# Patient Record
Sex: Female | Born: 1945
Health system: Southern US, Community
[De-identification: ages and names within clinical notes are randomized; demographics above are authoritative.]

## PROBLEM LIST (undated history)

## (undated) DIAGNOSIS — I10 Essential (primary) hypertension: Secondary | ICD-10-CM

## (undated) DIAGNOSIS — Z800 Family history of malignant neoplasm of digestive organs: Secondary | ICD-10-CM

## (undated) DIAGNOSIS — Z5189 Encounter for other specified aftercare: Secondary | ICD-10-CM

## (undated) DIAGNOSIS — Z8042 Family history of malignant neoplasm of prostate: Secondary | ICD-10-CM

## (undated) DIAGNOSIS — IMO0001 Reserved for inherently not codable concepts without codable children: Secondary | ICD-10-CM

## (undated) DIAGNOSIS — Z803 Family history of malignant neoplasm of breast: Secondary | ICD-10-CM

## (undated) DIAGNOSIS — R011 Cardiac murmur, unspecified: Secondary | ICD-10-CM

## (undated) HISTORY — DX: Family history of malignant neoplasm of breast: Z80.3

## (undated) HISTORY — DX: Family history of malignant neoplasm of prostate: Z80.42

## (undated) HISTORY — PX: NO PAST SURGERIES: SHX2092

## (undated) HISTORY — DX: Family history of malignant neoplasm of digestive organs: Z80.0

## (undated) HISTORY — PX: DILATION AND CURETTAGE OF UTERUS: SHX78

---

## 2011-09-26 ENCOUNTER — Other Ambulatory Visit (HOSPITAL_COMMUNITY)
Admission: RE | Admit: 2011-09-26 | Discharge: 2011-09-26 | Disposition: A | Discharge status: Home / Self Care | Payer: 59 | Source: Ambulatory Visit | Attending: Obstetrics and Gynecology | Admitting: Obstetrics and Gynecology

## 2011-09-26 DIAGNOSIS — N950 Postmenopausal bleeding: Secondary | ICD-10-CM | POA: Insufficient documentation

## 2011-10-16 ENCOUNTER — Encounter (HOSPITAL_COMMUNITY): Payer: Self-pay | Admitting: Pharmacist

## 2011-10-17 ENCOUNTER — Encounter (HOSPITAL_COMMUNITY)
Admission: RE | Admit: 2011-10-17 | Discharge: 2011-10-17 | Disposition: A | Discharge status: Home / Self Care | Payer: 59 | Source: Ambulatory Visit | Attending: Obstetrics and Gynecology | Admitting: Obstetrics and Gynecology

## 2011-10-17 ENCOUNTER — Encounter (HOSPITAL_COMMUNITY): Payer: Self-pay

## 2011-10-17 HISTORY — DX: Cardiac murmur, unspecified: R01.1

## 2011-10-17 HISTORY — DX: Essential (primary) hypertension: I10

## 2011-10-17 LAB — BASIC METABOLIC PANEL
BUN: 15 mg/dL (ref 6–23)
CO2: 27 mEq/L (ref 19–32)
Calcium: 9.9 mg/dL (ref 8.4–10.5)
Creatinine, Ser: 1.03 mg/dL (ref 0.50–1.10)
GFR calc non Af Amer: 56 mL/min — ABNORMAL LOW (ref >90)
Glucose, Bld: 103 mg/dL — ABNORMAL HIGH (ref 70–99)

## 2011-10-17 LAB — CBC
MCH: 27.9 pg (ref 26.0–34.0)
MCHC: 32.7 g/dL (ref 30.0–36.0)
MCV: 85.4 fL (ref 78.0–100.0)
Platelets: 198 10*3/uL (ref 150–400)

## 2011-10-17 NOTE — Pre-Procedure Instructions (Signed)
Pt K of 3.1 reported to Dr Nancee Liter on hyzaar. Instructed to notify Dr Paul Half of need to add potassium supplement, Dr Arby Barrette recommends K-dur 40 meq twice daily until day of surgery. Spoke with Dr Greene-recommendations given. Will do potassium level dos stat

## 2011-10-17 NOTE — Patient Instructions (Addendum)
20 Sascha Mcray  10/17/2011   Your procedure is scheduled on:  Dec. 10, 3:45  Enter through the Main Entrance of [facility] at 2:15 AM.  Pick up the phone at the desk and dial [phone].   Call this number if you have problems the morning of surgery: [phone]   Remember:   Do not eat food:after midnight the evening prior to surgery.  Do not drink clear liquids: 4 Hours before arrival.  Take these medicines the morning of surgery with A SIP OF WATER: Take all Blood Pressure Medications day of surgery. Hold Metformin for 24hrs. Take Nexium as directed. Take Potassium AM of surgery.   Do not wear jewelry, make-up or nail polish.  Do not wear lotions, powders, or perfumes. You may wear deodorant.  Do not shave 48 hours prior to surgery.  Do not bring valuables to the hospital.  Contacts, dentures or bridgework may not be worn into surgery.  Leave suitcase in the car. After surgery it may be brought to your room.  For patients admitted to the hospital, checkout time is 11:00 AM the day of discharge.   Patients discharged the day of surgery will not be allowed to drive home.  Name and phone number of your driver: Lilyona Richner  Special Instructions: CHG Shower Use Special Wash: 1/2 bottle night before surgery and 1/2 bottle morning of surgery.   Please read over the following fact sheets that you were given: Antibacterial wash.

## 2011-10-22 ENCOUNTER — Ambulatory Visit (HOSPITAL_COMMUNITY)
Admission: RE | Admit: 2011-10-22 | Discharge: 2011-10-22 | Disposition: A | Discharge status: Home / Self Care | Payer: 59 | Source: Ambulatory Visit | Attending: Obstetrics and Gynecology | Admitting: Obstetrics and Gynecology

## 2011-10-22 ENCOUNTER — Ambulatory Visit (HOSPITAL_COMMUNITY): Payer: 59 | Admitting: Anesthesiology

## 2011-10-22 ENCOUNTER — Encounter (HOSPITAL_COMMUNITY): Payer: Self-pay | Admitting: Anesthesiology

## 2011-10-22 ENCOUNTER — Encounter (HOSPITAL_COMMUNITY): Payer: Self-pay | Admitting: *Deleted

## 2011-10-22 ENCOUNTER — Other Ambulatory Visit: Payer: Self-pay | Admitting: Obstetrics and Gynecology

## 2011-10-22 ENCOUNTER — Encounter (HOSPITAL_COMMUNITY)
Admission: RE | Disposition: A | Discharge status: Home / Self Care | Payer: Self-pay | Source: Ambulatory Visit | Attending: Obstetrics and Gynecology

## 2011-10-22 DIAGNOSIS — E119 Type 2 diabetes mellitus without complications: Secondary | ICD-10-CM | POA: Insufficient documentation

## 2011-10-22 DIAGNOSIS — Z01812 Encounter for preprocedural laboratory examination: Secondary | ICD-10-CM | POA: Insufficient documentation

## 2011-10-22 DIAGNOSIS — Z01818 Encounter for other preprocedural examination: Secondary | ICD-10-CM | POA: Insufficient documentation

## 2011-10-22 DIAGNOSIS — N950 Postmenopausal bleeding: Secondary | ICD-10-CM

## 2011-10-22 DIAGNOSIS — I10 Essential (primary) hypertension: Secondary | ICD-10-CM | POA: Insufficient documentation

## 2011-10-22 DIAGNOSIS — N840 Polyp of corpus uteri: Secondary | ICD-10-CM | POA: Insufficient documentation

## 2011-10-22 LAB — POTASSIUM: Potassium: 3.4 mEq/L — ABNORMAL LOW (ref 3.5–5.1)

## 2011-10-22 SURGERY — DILATATION & CURETTAGE/HYSTEROSCOPY WITH RESECTOCOPE
Anesthesia: Monitor Anesthesia Care

## 2011-10-22 MED ORDER — KETOROLAC TROMETHAMINE 30 MG/ML IJ SOLN
INTRAMUSCULAR | Status: DC | PRN
  Administered 2011-10-22: 30 mg via INTRAVENOUS

## 2011-10-22 MED ORDER — MEPERIDINE HCL 25 MG/ML IJ SOLN
6.2500 mg | INTRAMUSCULAR | Status: DC | PRN
  Administered 2011-10-22: 6.25 mg via INTRAVENOUS

## 2011-10-22 MED ORDER — ONDANSETRON HCL 4 MG/2ML IJ SOLN
INTRAMUSCULAR | Status: AC
  Filled 2011-10-22: qty 2

## 2011-10-22 MED ORDER — KETOROLAC TROMETHAMINE 60 MG/2ML IM SOLN
INTRAMUSCULAR | Status: AC
  Filled 2011-10-22: qty 2

## 2011-10-22 MED ORDER — GLYCOPYRROLATE 0.2 MG/ML IJ SOLN
INTRAMUSCULAR | Status: AC
  Filled 2011-10-22: qty 1

## 2011-10-22 MED ORDER — DOXYCYCLINE HYCLATE 100 MG IV SOLR
100.0000 mg | INTRAVENOUS | Status: AC
  Administered 2011-10-22: 100 mg via INTRAVENOUS
  Filled 2011-10-22: qty 100

## 2011-10-22 MED ORDER — FENTANYL CITRATE 0.05 MG/ML IJ SOLN
INTRAMUSCULAR | Status: DC | PRN
  Administered 2011-10-22: 100 ug via INTRAVENOUS
  Administered 2011-10-22 (×3): 25 ug via INTRAVENOUS

## 2011-10-22 MED ORDER — LIDOCAINE HCL (CARDIAC) 20 MG/ML IV SOLN
INTRAVENOUS | Status: AC
  Filled 2011-10-22: qty 5

## 2011-10-22 MED ORDER — PROPOFOL 10 MG/ML IV EMUL
INTRAVENOUS | Status: AC
  Filled 2011-10-22: qty 40

## 2011-10-22 MED ORDER — LIDOCAINE-EPINEPHRINE 1 %-1:100000 IJ SOLN
INTRAMUSCULAR | Status: DC | PRN
  Administered 2011-10-22: 20 mL

## 2011-10-22 MED ORDER — PROPOFOL 10 MG/ML IV EMUL
INTRAVENOUS | Status: AC
  Filled 2011-10-22: qty 20

## 2011-10-22 MED ORDER — FENTANYL CITRATE 0.05 MG/ML IJ SOLN
INTRAMUSCULAR | Status: AC
  Filled 2011-10-22: qty 2

## 2011-10-22 MED ORDER — MIDAZOLAM HCL 5 MG/5ML IJ SOLN
INTRAMUSCULAR | Status: DC | PRN
  Administered 2011-10-22: 2 mg via INTRAVENOUS

## 2011-10-22 MED ORDER — MEPERIDINE HCL 25 MG/ML IJ SOLN
INTRAMUSCULAR | Status: AC
  Filled 2011-10-22: qty 1

## 2011-10-22 MED ORDER — MIDAZOLAM HCL 2 MG/2ML IJ SOLN
INTRAMUSCULAR | Status: AC
  Filled 2011-10-22: qty 2

## 2011-10-22 MED ORDER — ONDANSETRON HCL 4 MG/2ML IJ SOLN
INTRAMUSCULAR | Status: DC | PRN
  Administered 2011-10-22: 4 mg via INTRAVENOUS

## 2011-10-22 MED ORDER — FENTANYL CITRATE 0.05 MG/ML IJ SOLN: 25.0000 ug | INTRAMUSCULAR | Status: DC | PRN

## 2011-10-22 MED ORDER — DEXAMETHASONE SODIUM PHOSPHATE 10 MG/ML IJ SOLN
INTRAMUSCULAR | Status: AC
  Filled 2011-10-22: qty 1

## 2011-10-22 MED ORDER — DEXTROSE 5 % IV SOLN
INTRAVENOUS | Status: DC | PRN
  Administered 2011-10-22: 17:00:00 via INTRAVENOUS

## 2011-10-22 MED ORDER — METOCLOPRAMIDE HCL 5 MG/ML IJ SOLN: 10.0000 mg | Freq: Once | INTRAMUSCULAR | Status: DC | PRN

## 2011-10-22 MED ORDER — LACTATED RINGERS IV SOLN
INTRAVENOUS | Status: DC
  Administered 2011-10-22 (×2): via INTRAVENOUS
  Administered 2011-10-22: 75 mL/h via INTRAVENOUS

## 2011-10-22 MED ORDER — SODIUM CHLORIDE 0.9 % IR SOLN
Status: DC | PRN
  Administered 2011-10-22: 3000 mL

## 2011-10-22 MED ORDER — PROPOFOL 10 MG/ML IV EMUL
INTRAVENOUS | Status: DC | PRN
  Administered 2011-10-22: 200 mg via INTRAVENOUS

## 2011-10-22 SURGICAL SUPPLY — 15 items
CANISTER SUCTION 2500CC (MISCELLANEOUS) ×2 IMPLANT
CATH ROBINSON RED A/P 16FR (CATHETERS) ×2 IMPLANT
CLOTH BEACON ORANGE TIMEOUT ST (SAFETY) ×2 IMPLANT
CONTAINER PREFILL 10% NBF 60ML (FORM) ×4 IMPLANT
ELECT REM PT RETURN 9FT ADLT (ELECTROSURGICAL)
ELECTRODE REM PT RTRN 9FT ADLT (ELECTROSURGICAL) IMPLANT
GLOVE BIO SURGEON STRL SZ7.5 (GLOVE) ×2 IMPLANT
GLOVE BIOGEL PI IND STRL 8 (GLOVE) ×2 IMPLANT
GLOVE BIOGEL PI INDICATOR 8 (GLOVE) ×2
GOWN PREVENTION PLUS LG XLONG (DISPOSABLE) ×2 IMPLANT
GOWN STRL REIN XL XLG (GOWN DISPOSABLE) ×2 IMPLANT
LOOP ANGLED CUTTING 22FR (CUTTING LOOP) IMPLANT
PACK HYSTEROSCOPY LF (CUSTOM PROCEDURE TRAY) ×2 IMPLANT
TOWEL OR 17X24 6PK STRL BLUE (TOWEL DISPOSABLE) ×4 IMPLANT
WATER STERILE IRR 1000ML POUR (IV SOLUTION) ×2 IMPLANT

## 2011-10-22 NOTE — Transfer of Care (Signed)
Immediate Anesthesia Transfer of Care Note  Patient: Miranda Francis  Procedure(s) Performed:  DILATATION & CURETTAGE/HYSTEROSCOPY WITH RESECTOSCOPE  Patient Location: PACU  Anesthesia Type: General  Level of Consciousness: awake and sedated  Airway & Oxygen Therapy: Patient Spontanous Breathing  Post-op Assessment: Report given to PACU RN  Post vital signs: Reviewed and stable  Complications: No apparent anesthesia complications

## 2011-10-22 NOTE — H&P (Signed)
NAMEBRENIYA, Miranda Francis             ACCOUNT NO.:  [id number]  MEDICAL RECORD NO.:  [id number]  LOCATION:  [facility]                         FACILITY:  [facility]  PHYSICIAN:  Pricilla Holm, MD      DATE OF BIRTH:  [birth year]  DATE OF ADMISSION:  10/22/2011 DATE OF DISCHARGE:                             HISTORY & PHYSICAL   PRINCIPAL DIAGNOSIS:  Postmenopausal bleeding.  HISTORY OF PRESENT ILLNESS:  The patient is a 65 year old, G3, P 2-0-1-2 who complains of several years of postmenopausal bleeding.  The patient states that it has recently gotten worse in nature.  The patient states that the bleeding is irregular.  The patient originally states that the bleeding was mere vaginal spotting.  However, over the last several months, the bleeding has progressed.  On the patient's second visit to the clinic during her annual exam and preoperative visit, the patient stated that this apparently had been an issue for her several years ago when she underwent an apparent hysteroscopy, D and C as an outpatient in a clinic.  She does not recall the outcome of that, but seems to remember that there was a possible polyp seen during the hysteroscopy.  PAST MEDICAL HISTORY:  Hypertension, dyspnea, diabetes mellitus, hyperlipidemia, hypokalemia.  PAST SURGICAL HISTORY:  D and C, hysteroscopy.  PAST OBSTETRICAL HISTORY:  G3, P 2-0-1-2, SVD x2, SAB x1.  PAST GYNECOLOGICAL HISTORY:  No history of abnormal Pap smears or history of STDs.  There is a positive history of postmenopausal bleeding.  ALLERGIES:  No known drug allergies.  MEDICATIONS:  Potassium chloride, Hyzaar, simvastatin, aspirin, Nexium, and metformin.  FAMILY HISTORY:  The patient's mother, father, and sister have diabetes. The patient's mother and sister have hypertension.  The patient's other sister died of colon cancer and another sister has heart disease.  SOCIAL HISTORY:  No alcohol, tobacco, or drugs.  PHYSICAL EXAMINATION:  VITAL  SIGNS:  Blood pressure 138/80, pulse 84, weight 211 pounds, and height 5 feet 3 inches. HEART:  Regular rate and rhythm. LUNGS:  Clear to auscultation bilaterally with no rhonchi or rales. ABDOMEN:  Soft, nontender, and nondistended.  The patient is obese in appearance.  There is no palpable organomegaly. PELVIC:  Sterile vaginal exam is performed as well as sterile speculum exam.  There is a small amount of bloody vaginal discharge in the vault. There is also decreased estrogen effect consistent with the patient's postmenopausal state.  The cervix is friable consistent with cervicitis. The uterus is present, but difficult to determine with respect to size and position secondary to habitus and general intolerance of exam. There is no uterine or adnexal tenderness.  There does not appear to be any adnexal fullness or adnexal masses palpated.  ASSESSMENT/PLAN:  A 65 year old, G3, P 2-0-1-2 who presents with several years' worth of postmenopausal bleeding.  The patient seems to have had a hysteroscopy, D and C in an outpatient clinic in the past, but there is no recall as to what results that found.  I offered the patient a second hysteroscopy, D and C.  The patient also had cervicitis and was previously treated with doxycycline for a 7-day course.  The patient also recently underwent an  endometrial biopsy which demonstrated fragments of endometrial polyp with associated disordered proliferative endometrium.  There was no atypia or malignancy identified.  I discussed the option of hysteroscopy, D and C to confirm possible endometrial polyp leading to bleeding.  The patient agreed to proceed with this.  Prior to the surgery, the patient understood the risks, benefits, and alternatives to the surgery.  The risks include infection, bleeding, perforation of the uterus, need for further surgery, damage to surrounding structures including the bladder, bowel, nerves, arteries, vessels, uterus,  tubes, and ovaries.  The patient agreed to proceed. The patient will be given preoperative antibiotics in the form of doxycycline IV on-call to the operating room.  Prior to the patient's surgery, the patient was noted to have a potassium of 3.1.  The anesthesiologist recommended that the patient be on potassium chloride 40 mEq b.i.d. until the day of surgery.  The medication was called in for the patient to receive it.          ______________________________ Pricilla Holm, MD     RB/MEDQ  D:  10/22/2011  T:  10/22/2011  Job:  [id number]

## 2011-10-22 NOTE — Anesthesia Preprocedure Evaluation (Addendum)
Anesthesia Evaluation  Patient identified by MRN, date of birth, ID band Patient awake    Reviewed: Allergy & Precautions, H&P , NPO status , Patient's Chart, lab work & pertinent test results  Airway Mallampati: III TM Distance: >3 FB Neck ROM: full    Dental No notable dental hx. (+) Teeth Intact   Pulmonary neg pulmonary ROS,  clear to auscultation  Pulmonary exam normal       Cardiovascular hypertension, On Medications regular Normal    Neuro/Psych Negative Neurological ROS  Negative Psych ROS   GI/Hepatic negative GI ROS, Neg liver ROS,   Endo/Other  Well Controlled, Type 2, Oral Hypoglycemic Agents  Renal/GU negative Renal ROS  Genitourinary negative   Musculoskeletal   Abdominal Normal abdominal exam  (+)   Peds  Hematology negative hematology ROS (+)   Anesthesia Other Findings   Reproductive/Obstetrics negative OB ROS                           Anesthesia Physical Anesthesia Plan  ASA: III  Anesthesia Plan: General   Post-op Pain Management:    Induction:   Airway Management Planned: LMA  Additional Equipment:   Intra-op Plan:   Post-operative Plan:   Informed Consent: I have reviewed the patients History and Physical, chart, labs and discussed the procedure including the risks, benefits and alternatives for the proposed anesthesia with the patient or authorized representative who has indicated his/her understanding and acceptance.   Dental Advisory Given  Plan Discussed with: Anesthesiologist, CRNA and Surgeon  Anesthesia Plan Comments:        Anesthesia Quick Evaluation

## 2011-10-22 NOTE — Brief Op Note (Signed)
10/22/2011  6:40 PM  PATIENT:  Miranda Francis  65 y.o. female  PRE-OPERATIVE DIAGNOSIS:  Post-Menopausal Bleeding  POST-OPERATIVE DIAGNOSIS:  Post-Menopausal Bleeding  PROCEDURE:  Procedure(s): DILATATION & CURETTAGE/HYSTEROSCOPY WITH RESECTOSCOPE  SURGEON:  Surgeon(s): Pricilla Holm, MD Fortino Sic, MD  ANESTHESIA: LMA  UOP:  200 cc on straight cath prior to case.  EBL:  5 cc.   IVF:  2550 Hysteroscope fluid deficit 50 cc.   LOCAL MEDICATIONS USED:  9 cc of 1/100,000 lidocaine with epi.  SPECIMEN:  resected potions of endometrial polyp, endometrial curettings.    DISPOSITION OF SPECIMEN:  To path   COUNTS:  Correct.  PLAN OF CARE: discharge when passing routine same day surgery milestones.    PATIENT DISPOSITION: to PACU

## 2011-10-22 NOTE — H&P (Signed)
Admission History and Physical dictated:  # [id number].

## 2011-10-22 NOTE — Anesthesia Postprocedure Evaluation (Signed)
Anesthesia Post Note  Patient: Miranda Francis  Procedure(s) Performed:  DILATATION & CURETTAGE/HYSTEROSCOPY WITH RESECTOSCOPE  Anesthesia type: GA  Patient location: PACU  Post pain: Pain level controlled  Post assessment: Post-op Vital signs reviewed  Last Vitals:  Filed Vitals:   10/22/11 2015  BP: 112/62  Pulse: 75  Temp:   Resp: 16    Post vital signs: Reviewed  Level of consciousness: sedated  Complications: No apparent anesthesia complications

## 2011-10-24 NOTE — Progress Notes (Signed)
Op report dictated:  [phone]

## 2011-10-25 NOTE — Op Note (Signed)
Miranda Francis, Miranda Francis             ACCOUNT NO.:  [id number]  MEDICAL RECORD NO.:  [id number]  LOCATION:  [facility]                          FACILITY:  [facility]  PHYSICIAN:  Pricilla Holm, MD      DATE OF BIRTH:  December 09, 1945  DATE OF PROCEDURE:  10/22/2011 DATE OF DISCHARGE:  10/22/2011                              OPERATIVE REPORT   PREOPERATIVE DIAGNOSIS:  Postmenopausal bleeding.  POSTOPERATIVE DIAGNOSES:  Postmenopausal bleeding, uterine polyp.  PROCEDURE:  Hysteroscopy, endometrial polypectomy with the use of resectoscope, dilation and curettage.  SURGEON:  Pricilla Holm, MD  ASSISTANT:  Arlyce Harman, MD.  ANESTHESIA:  LMA, 9 mL of 1/100,000 lidocaine with epi injected as paracervical block.  URINE OUTPUT:  200 mL clear urine on straight cath prior to case.  ESTIMATED BLOOD LOSS:  5 mL.  IV FLUIDS:  2550 mL LR, hysteroscopy fluid deficit 50 mL.  PATHOLOGY:  Portion of resected endometrial polyps, endometrial curettings.  COMPLICATIONS:  None.  FINDINGS:  Pelvic exam prior to beginning of the case revealed an anteverted uterus.  There were no adnexal masses or adnexal fullness on exam under anesthesia.  Immediately on placement of the hysteroscope, a polyp was clearly visible at the 12 o'clock location in the lower uterine segment.  There appeared to be an increase in the amount of tissue in the endometrial cavity.  Despite attempts to find the bilateral tubal ostia, these were not visualized during the surgery. Endometrial curettings were obtained:  A moderate amount of endometrial tissue was sent for pathologic evaluation from the D and C.  PROCEDURE IN DETAIL:  The patient and I discussed the risks, benefits, alternatives to this procedure.  The patient had previously undergone endometrial biopsy which demonstrated no significant dysplasia or malignancy, but did demonstrate portion of an endometrial polyp.  The patient and I discussed the risks including infection,  bleeding, perforation of the uterus requiring additional surgery, and damage to abdominopelvic structures.  However, given the potential for malignancy in a postmenopausal woman with postmenopausal bleeding, the patient decided to perform a more definitive procedure with an endometrial biopsy.  The patient therefore after discussion of risks, benefits gave written and verbal consent to proceed with hysteroscopy, D and C.  The patient was taken to the operating room and placed in the dorsal lithotomy position with legs elevated in [place] stirrups.  The patient had laryngeal mask anesthesia and this was obtained without difficulty. The patient then underwent an exam under anesthesia which revealed the above findings, namely an appropriately sized uterus that was in the anteverted position.  After the patient was prepped and draped, the patient's bladder was drained with a straight cath.  Thereafter a speculum was placed inside the vagina.  However, the vaginal vault extended beyond the length of the speculum and therefore a large weighted speculum was inserted.  This too did not provide appropriate visualization of the cervix.  Therefore, a large bivalve speculum was obtained and this provided appropriate visualization of the cervix.  The cervix appeared agglutinated to the fornices of the vagina.  Therefore a single tooth tenaculum was placed on the anterior lip of the cervix. This, however, did not provide an appropriate purchase  of tissue and therefore a second single-tooth tenaculum was placed vertically on the posterior lip of the cervix.  With these 2 tenacula in place, the uterus could be appropriately manipulated.  A uterine sound was inserted into the uterus and the endometrial cavity sounded to approximately 9 cm. The TRUCLEAR hysteroscope device was thereafter inserted in the uterus. The tenacula were pulled toward the operating surgeon and the hysteroscope fluid was turned into  the on position prior to placement through the cervix and into the endometrial cavity.  Immediately on placements, the endometrial polyp described above was noted. Thereafter, the [facility] hysteroscopic resectoscope was inserted through the sleeve of the hysteroscope.  Under direct visualization, the polyp was resected with direction from the [facility] representatives that were in the room for the duration of the procedure.  Several passes with the resectoscope were required to remove the polyp.  Thereafter the entire endometrial cavity was inspected.  This included the area where the polyp was formally located.  There did not appear to be any perforation of the uterus at this time.  Thereafter the hysteroscope device was removed and a deficit of 50 mL was noted.  The patient was further dilated.  Prior to the insertion of the hysteroscope, the patient was dilated up to a 19 Pratt.  Thereafter the patient was dilated to an approximately 25-27 Shawnie Pons.  A sharp curettage was performed until a gritty texture was noted throughout.  A Telfa pad was placed inside the posterior fornix in order to collect the endometrial curettings.  These were handed off to be evaluated pathologically.  The tenaculum were thereafter removed from the patient's cervix and hemostasis was noted. All instruments were thereafter removed from the vagina and the procedure was terminated.  The patient tolerated the procedure well. All instrument counts were correct x2.  The patient was transferred to the recovery room in stable condition.  After the procedure, I discussed the operative findings with both the patient and her husband.  The patient was given routine postoperative instructions and precautions.  The patient was to follow up with me in 1 week.  The patient was given prescriptions for Vicodin and ibuprofen.          ______________________________ Pricilla Holm, MD     RB/MEDQ  D:  10/24/2011  T:   10/25/2011  Job:  [id number]

## 2011-10-28 ENCOUNTER — Inpatient Hospital Stay (HOSPITAL_COMMUNITY)
Admission: AD | Admit: 2011-10-28 | Discharge: 2011-10-31 | DRG: 864 | Disposition: A | Discharge status: Home / Self Care | Payer: 59 | Source: Ambulatory Visit | Attending: Obstetrics and Gynecology | Admitting: Obstetrics and Gynecology

## 2011-10-28 ENCOUNTER — Inpatient Hospital Stay (HOSPITAL_COMMUNITY): Payer: 59

## 2011-10-28 ENCOUNTER — Encounter (HOSPITAL_COMMUNITY): Payer: Self-pay | Admitting: Family

## 2011-10-28 DIAGNOSIS — R1310 Dysphagia, unspecified: Secondary | ICD-10-CM | POA: Diagnosis present

## 2011-10-28 DIAGNOSIS — R5082 Postprocedural fever: Principal | ICD-10-CM | POA: Diagnosis present

## 2011-10-28 DIAGNOSIS — N719 Inflammatory disease of uterus, unspecified: Secondary | ICD-10-CM

## 2011-10-28 DIAGNOSIS — E119 Type 2 diabetes mellitus without complications: Secondary | ICD-10-CM | POA: Diagnosis present

## 2011-10-28 DIAGNOSIS — R112 Nausea with vomiting, unspecified: Secondary | ICD-10-CM | POA: Diagnosis present

## 2011-10-28 DIAGNOSIS — R109 Unspecified abdominal pain: Secondary | ICD-10-CM | POA: Diagnosis present

## 2011-10-28 DIAGNOSIS — I10 Essential (primary) hypertension: Secondary | ICD-10-CM | POA: Diagnosis present

## 2011-10-28 LAB — CBC
HCT: 29.8 % — ABNORMAL LOW (ref 36.0–46.0)
MCH: 27.7 pg (ref 26.0–34.0)
MCHC: 33.3 g/dL (ref 30.0–36.0)
MCHC: 34.2 g/dL (ref 30.0–36.0)
MCV: 83 fL (ref 78.0–100.0)
MCV: 82.5 fL (ref 78.0–100.0)
Platelets: 177 10*3/uL (ref 150–400)
RBC: 3.83 MIL/uL — ABNORMAL LOW (ref 3.87–5.11)
RDW: 15.5 % (ref 11.5–15.5)

## 2011-10-28 LAB — GLUCOSE, CAPILLARY
Glucose-Capillary: 124 mg/dL — ABNORMAL HIGH (ref 70–99)
Glucose-Capillary: 99 mg/dL (ref 70–99)

## 2011-10-28 LAB — COMPREHENSIVE METABOLIC PANEL
ALT: 25 U/L (ref 0–35)
AST: 38 U/L — ABNORMAL HIGH (ref 0–37)
Albumin: 3.5 g/dL (ref 3.5–5.2)
Alkaline Phosphatase: 99 U/L (ref 39–117)
CO2: 24 mEq/L (ref 19–32)
Chloride: 96 mEq/L (ref 96–112)
Creatinine, Ser: 0.99 mg/dL (ref 0.50–1.10)
Potassium: 3.4 mEq/L — ABNORMAL LOW (ref 3.5–5.1)
Sodium: 134 mEq/L — ABNORMAL LOW (ref 135–145)
Total Bilirubin: 0.5 mg/dL (ref 0.3–1.2)

## 2011-10-28 LAB — URINE MICROSCOPIC-ADD ON

## 2011-10-28 LAB — URINALYSIS, ROUTINE W REFLEX MICROSCOPIC
Bilirubin Urine: NEGATIVE
Ketones, ur: 15 mg/dL — AB
Nitrite: NEGATIVE
Protein, ur: 30 mg/dL — AB
pH: 6 (ref 5.0–8.0)

## 2011-10-28 LAB — WET PREP, GENITAL
Clue Cells Wet Prep HPF POC: NONE SEEN
Trich, Wet Prep: NONE SEEN

## 2011-10-28 MED ORDER — METRONIDAZOLE IN NACL 5-0.79 MG/ML-% IV SOLN
500.0000 mg | Freq: Four times a day (QID) | INTRAVENOUS | Status: DC
  Administered 2011-10-28 – 2011-10-31 (×11): 500 mg via INTRAVENOUS
  Filled 2011-10-28 (×15): qty 100

## 2011-10-28 MED ORDER — ZOLPIDEM TARTRATE 5 MG PO TABS
5.0000 mg | ORAL_TABLET | Freq: Every evening | ORAL | Status: DC | PRN
  Administered 2011-10-28: 10 mg via ORAL
  Administered 2011-10-29 – 2011-10-30 (×3): 5 mg via ORAL
  Filled 2011-10-28: qty 1
  Filled 2011-10-28: qty 2
  Filled 2011-10-28 (×2): qty 1

## 2011-10-28 MED ORDER — MORPHINE SULFATE 4 MG/ML IJ SOLN
5.0000 mg | Freq: Once | INTRAMUSCULAR | Status: AC
  Administered 2011-10-28: 5 mg via INTRAVENOUS
  Filled 2011-10-28: qty 2

## 2011-10-28 MED ORDER — LACTATED RINGERS IV SOLN
INTRAVENOUS | Status: DC
  Administered 2011-10-29 – 2011-10-30 (×4): via INTRAVENOUS

## 2011-10-28 MED ORDER — ACETAMINOPHEN 500 MG PO TABS
1000.0000 mg | ORAL_TABLET | Freq: Once | ORAL | Status: AC
  Administered 2011-10-28: 1000 mg via ORAL
  Filled 2011-10-28: qty 2

## 2011-10-28 MED ORDER — POTASSIUM CHLORIDE CRYS ER 20 MEQ PO TBCR
20.0000 meq | EXTENDED_RELEASE_TABLET | Freq: Two times a day (BID) | ORAL | Status: DC
  Administered 2011-10-28 – 2011-10-30 (×4): 20 meq via ORAL
  Filled 2011-10-28 (×8): qty 1

## 2011-10-28 MED ORDER — ASPIRIN 81 MG PO CHEW
81.0000 mg | CHEWABLE_TABLET | Freq: Every day | ORAL | Status: DC
  Administered 2011-10-29 – 2011-10-30 (×2): 81 mg via ORAL
  Filled 2011-10-28 (×5): qty 1

## 2011-10-28 MED ORDER — LOSARTAN POTASSIUM 50 MG PO TABS
100.0000 mg | ORAL_TABLET | Freq: Every day | ORAL | Status: DC
  Administered 2011-10-28 – 2011-10-30 (×3): 100 mg via ORAL
  Filled 2011-10-28 (×5): qty 2

## 2011-10-28 MED ORDER — LACTATED RINGERS IV SOLN
INTRAVENOUS | Status: DC
  Administered 2011-10-28 (×2): via INTRAVENOUS

## 2011-10-28 MED ORDER — ONDANSETRON HCL 4 MG/2ML IJ SOLN
4.0000 mg | Freq: Four times a day (QID) | INTRAMUSCULAR | Status: DC | PRN
  Administered 2011-10-28: 4 mg via INTRAVENOUS
  Filled 2011-10-28: qty 2

## 2011-10-28 MED ORDER — PANTOPRAZOLE SODIUM 40 MG PO TBEC
80.0000 mg | DELAYED_RELEASE_TABLET | Freq: Every day | ORAL | Status: DC
  Administered 2011-10-28 – 2011-10-30 (×2): 80 mg via ORAL
  Filled 2011-10-28 (×5): qty 2

## 2011-10-28 MED ORDER — LOSARTAN POTASSIUM-HCTZ 100-25 MG PO TABS: 1.0000 | ORAL_TABLET | Freq: Every day | ORAL | Status: DC

## 2011-10-28 MED ORDER — HYDROCHLOROTHIAZIDE 25 MG PO TABS
25.0000 mg | ORAL_TABLET | Freq: Every day | ORAL | Status: DC
  Administered 2011-10-28 – 2011-10-30 (×3): 25 mg via ORAL
  Filled 2011-10-28 (×5): qty 1

## 2011-10-28 MED ORDER — ONDANSETRON HCL 4 MG PO TABS: 4.0000 mg | ORAL_TABLET | Freq: Four times a day (QID) | ORAL | Status: DC | PRN

## 2011-10-28 MED ORDER — SODIUM CHLORIDE 0.9 % IV SOLN
3.0000 g | Freq: Four times a day (QID) | INTRAVENOUS | Status: DC
  Administered 2011-10-28 – 2011-10-31 (×13): 3 g via INTRAVENOUS
  Filled 2011-10-28 (×16): qty 3

## 2011-10-28 MED ORDER — METFORMIN HCL 500 MG PO TABS
1000.0000 mg | ORAL_TABLET | Freq: Every day | ORAL | Status: DC
  Administered 2011-10-28: 1000 mg via ORAL
  Filled 2011-10-28 (×2): qty 2

## 2011-10-28 MED ORDER — OXYCODONE HCL 5 MG PO TABS
5.0000 mg | ORAL_TABLET | ORAL | Status: DC | PRN
  Administered 2011-10-28 – 2011-10-31 (×5): 5 mg via ORAL
  Filled 2011-10-28 (×5): qty 1

## 2011-10-28 NOTE — Progress Notes (Signed)
To us via wc.

## 2011-10-28 NOTE — ED Notes (Signed)
Dr Richardson Dopp notified of pt's return from u/s and u/s results. Will come see pt. Pt aware

## 2011-10-28 NOTE — H&P (Addendum)
Miranda Francis is an 65 y.o. female. G3P201 and 2 status post a hysteroscopy D&C of 10/22/2011 performed by Dr. Lorraine Lax our presents complaining of severe pelvic pain in with a fever of 102. She is taking Vicodin for pain with only partial relief states her pain as a 10 out of 10 today. She received morphine sulfate 5 mg IV upon arrival her pain is currently a 3-4 out of Tian. She denies abnormal vaginal discharge. She states she has had some nausea and one episode of emesis.  Pertinent Gynecological History: Menses: post-menopausal Bleeding: post menopausal bleeding Contraception: none DES exposure: denies Blood transfusions: none Sexually transmitted diseases: no past history Previous GYN Procedures: hysteroscopy D&C x 2     Menstrual History: No LMP recorded. Patient is postmenopausal.    Past Medical History  Diagnosis Date  . Hypertension   . Heart murmur   . Diabetes mellitus     Past Surgical History  Procedure Date  . No past surgeries     No family history on file.  Social History:  reports that she has never smoked. She has never used smokeless tobacco. She reports that she does not drink alcohol or use illicit drugs.  Allergies: No Known Allergies  Prescriptions prior to admission  Medication Sig Dispense Refill  . aspirin 81 MG chewable tablet Chew 81 mg by mouth daily.        Marland Kitchen esomeprazole (NEXIUM) 40 MG capsule Take 40 mg by mouth daily before breakfast.        . Hydrocodone-Acetaminophen (VICODIN PO) Take 1 tablet by mouth every 6 (six) hours as needed. For severe pain       . losartan-hydrochlorothiazide (HYZAAR) 100-25 MG per tablet Take 1 tablet by mouth daily.        . metFORMIN (GLUCOPHAGE) 500 MG tablet Take 1,000 mg by mouth at bedtime.        . potassium chloride SA (K-DUR,KLOR-CON) 20 MEQ tablet Take 20 mEq by mouth 2 (two) times daily.        . simvastatin (ZOCOR) 40 MG tablet Take 40 mg by mouth at bedtime.        . Vitamin D,  Ergocalciferol, (DRISDOL) 50000 UNITS CAPS Take 50,000 Units by mouth every 7 (seven) days.        Marland Kitchen zolpidem (AMBIEN) 10 MG tablet Take 10 mg by mouth at bedtime as needed. For sleep          ROS negative except as stated in HPI  Blood pressure 114/57, pulse 80, temperature 100.5 F (38.1 C), temperature source Oral, resp. rate 16, height 5\' 3"  (1.6 m), SpO2 99.00%. CV RRR Lungs Clear abd soft tender suprapubicly  No rebound no guarding. + BS Pelvic exam : normal external genitalia, small amount of blood in the vaginal vault.. No abnormal discharge is noted.. Bimanual exam + cmt uterus is tender to palpation no adnexal masses appreciated .   Results for orders placed during the hospital encounter of 10/28/11 (from the past 24 hour(s))  GLUCOSE, CAPILLARY     Status: Abnormal   Collection Time   10/28/11  4:27 AM      Component Value Range   Glucose-Capillary 124 (*) 70 - 99 (mg/dL)   Comment 1 Notify RN    URINALYSIS, ROUTINE W REFLEX MICROSCOPIC     Status: Abnormal   Collection Time   10/28/11  4:35 AM      Component Value Range   Color, Urine YELLOW  YELLOW  APPearance CLEAR  CLEAR    Specific Gravity, Urine 1.015  1.005 - 1.030    pH 6.0  5.0 - 8.0    Glucose, UA NEGATIVE  NEGATIVE (mg/dL)   Hgb urine dipstick MODERATE (*) NEGATIVE    Bilirubin Urine NEGATIVE  NEGATIVE    Ketones, ur 15 (*) NEGATIVE (mg/dL)   Protein, ur 30 (*) NEGATIVE (mg/dL)   Urobilinogen, UA 0.2  0.0 - 1.0 (mg/dL)   Nitrite NEGATIVE  NEGATIVE    Leukocytes, UA NEGATIVE  NEGATIVE   URINE MICROSCOPIC-ADD ON     Status: Abnormal   Collection Time   10/28/11  4:35 AM      Component Value Range   Squamous Epithelial / LPF FEW (*) RARE    WBC, UA 0-2  <3 (WBC/hpf)   RBC / HPF 11-20  <3 (RBC/hpf)   Bacteria, UA RARE  RARE   COMPREHENSIVE METABOLIC PANEL     Status: Abnormal   Collection Time   10/28/11  5:25 AM      Component Value Range   Sodium 134 (*) 135 - 145 (mEq/L)   Potassium 3.4 (*) 3.5  - 5.1 (mEq/L)   Chloride 96  96 - 112 (mEq/L)   CO2 24  19 - 32 (mEq/L)   Glucose, Bld 127 (*) 70 - 99 (mg/dL)   BUN 10  6 - 23 (mg/dL)   Creatinine, Ser 1.61  0.50 - 1.10 (mg/dL)   Calcium 9.6  8.4 - 09.6 (mg/dL)   Total Protein 7.0  6.0 - 8.3 (g/dL)   Albumin 3.5  3.5 - 5.2 (g/dL)   AST 38 (*) 0 - 37 (U/L)   ALT 25  0 - 35 (U/L)   Alkaline Phosphatase 99  39 - 117 (U/L)   Total Bilirubin 0.5  0.3 - 1.2 (mg/dL)   GFR calc non Af Amer 59 (*) >90 (mL/min)   GFR calc Af Amer 68 (*) >90 (mL/min)  CBC     Status: Abnormal   Collection Time   10/28/11  5:25 AM      Component Value Range   WBC 5.3  4.0 - 10.5 (K/uL)   RBC 3.83 (*) 3.87 - 5.11 (MIL/uL)   Hemoglobin 10.6 (*) 12.0 - 15.0 (g/dL)   HCT 04.5 (*) 40.9 - 46.0 (%)   MCV 83.0  78.0 - 100.0 (fL)   MCH 27.7  26.0 - 34.0 (pg)   MCHC 33.3  30.0 - 36.0 (g/dL)   RDW 81.1  91.4 - 78.2 (%)   Platelets 177  150 - 400 (K/uL)  WET PREP, GENITAL     Status: Abnormal   Collection Time   10/28/11  8:50 AM      Component Value Range   Yeast, Wet Prep NONE SEEN  NONE SEEN    Trich, Wet Prep NONE SEEN  NONE SEEN    Clue Cells, Wet Prep NONE SEEN  NONE SEEN    WBC, Wet Prep HPF POC FEW (*) NONE SEEN     US Transvaginal Non-ob  10/28/2011  *RADIOLOGY REPORT*  Clinical Data: Pelvic pain.  TRANSABDOMINAL AND TRANSVAGINAL ULTRASOUND OF PELVIS Technique:  Both transabdominal and transvaginal ultrasound examinations of the pelvis were performed. Transabdominal technique was performed for global imaging of the pelvis including uterus, ovaries, adnexal regions, and pelvic cul-de-sac.  Comparison: None.   It was necessary to proceed with endovaginal exam following the transabdominal exam to visualize the endometrium and adnexa.  Findings:  Uterus: Measures 8.6 x  4.8 x 5.2 cm.  There are multiple small intramural fibroids, the largest of which measures 1.7 x 1.0 cm.  Endometrium: Normal in appearance and thickness, measuring 8 mm. No increased  vascularity.  Right ovary:  Measures 2.7 x 1.3 x 1.8 cm.  Normal sonographic appearance.  Left ovary: Not well visualized.  Other findings: No free fluid  IMPRESSION: Multiple intramural fibroids.  Endometrial appearance is within normal limits.  Normal right ovary.  Left ovary not visualized.  Original Report Authenticated By: Waneta Martins, M.D.   US Pelvis Complete  10/28/2011  *RADIOLOGY REPORT*  Clinical Data: Pelvic pain.  TRANSABDOMINAL AND TRANSVAGINAL ULTRASOUND OF PELVIS Technique:  Both transabdominal and transvaginal ultrasound examinations of the pelvis were performed. Transabdominal technique was performed for global imaging of the pelvis including uterus, ovaries, adnexal regions, and pelvic cul-de-sac.  Comparison: None.   It was necessary to proceed with endovaginal exam following the transabdominal exam to visualize the endometrium and adnexa.  Findings:  Uterus: Measures 8.6 x 4.8 x 5.2 cm.  There are multiple small intramural fibroids, the largest of which measures 1.7 x 1.0 cm.  Endometrium: Normal in appearance and thickness, measuring 8 mm. No increased vascularity.  Right ovary:  Measures 2.7 x 1.3 x 1.8 cm.  Normal sonographic appearance.  Left ovary: Not well visualized.  Other findings: No free fluid  IMPRESSION: Multiple intramural fibroids.  Endometrial appearance is within normal limits.  Normal right ovary.  Left ovary not visualized.  Original Report Authenticated By: Waneta Martins, M.D.    Assessment/Plan: 65 year old status post hysteroscopy D&C with suspected endometritis based on pelvic pain and fever  will admit for IV antibiotics Unasyn and Flagyl oxycodone as needed for pain. Diabetes metformin Hypertension Hyzaar  Pelvic exam : normal external genitalia, small amount of blood in the vaginal vault.. No abnormal discharge is noted.. Bimanual exam + cmt uterus is tender to palpation no adnexal masses appreciated .

## 2011-10-28 NOTE — ED Notes (Signed)
Pt vomited x1 after morphine given

## 2011-10-28 NOTE — ED Notes (Signed)
34 Miranda Francis CNM in to see pt

## 2011-10-28 NOTE — Consult Note (Signed)
Notified Dr. Richardson Dopp regarding HPI/exam and physical condition>orders obtained and Dr. Richardson Dopp will come evaluate pt when ultrasound is completed.

## 2011-10-28 NOTE — ED Provider Notes (Signed)
History     Chief Complaint  Patient presents with  . Fever    Pt has had fever off and on for a week   HPI  Pt is here with severe epigastric pain since surgery, pain is rated a 10/10.  Denies nausea or vomiting.    Past Medical History  Diagnosis Date  . Hypertension   . Heart murmur     Past Surgical History  Procedure Date  . No past surgeries     No family history on file.  History  Substance Use Topics  . Smoking status: Never Smoker   . Smokeless tobacco: Never Used  . Alcohol Use: No    Allergies: No Known Allergies  Prescriptions prior to admission  Medication Sig Dispense Refill  . aspirin 81 MG chewable tablet Chew 81 mg by mouth daily.        Marland Kitchen esomeprazole (NEXIUM) 40 MG capsule Take 40 mg by mouth daily before breakfast.        . Hydrocodone-Acetaminophen (VICODIN PO) Take 1 tablet by mouth every 6 (six) hours as needed. For severe pain       . losartan-hydrochlorothiazide (HYZAAR) 100-25 MG per tablet Take 1 tablet by mouth daily.        . metFORMIN (GLUCOPHAGE) 500 MG tablet Take 1,000 mg by mouth at bedtime.        . potassium chloride SA (K-DUR,KLOR-CON) 20 MEQ tablet Take 20 mEq by mouth 2 (two) times daily.        . simvastatin (ZOCOR) 40 MG tablet Take 40 mg by mouth at bedtime.        . Vitamin D, Ergocalciferol, (DRISDOL) 50000 UNITS CAPS Take 50,000 Units by mouth every 7 (seven) days.        Marland Kitchen zolpidem (AMBIEN) 10 MG tablet Take 10 mg by mouth at bedtime as needed. For sleep         ROS Physical Exam   Blood pressure 121/76, pulse 84, temperature 101.4 F (38.6 C), temperature source Oral, resp. rate 22, height 5\' 3"  (1.6 m), SpO2 99.00%.  Physical Exam   General appearance: alert, cooperative and appears stated age; appears uncomfortable; moaning in pain. Head: Normocephalic, without obvious abnormality, atraumatic Lungs: clear to auscultation bilaterally Heart: regular rate and rhythm, S1, S2 normal, no murmur, click, rub or  gallop Abdomen: soft, tender in mid epigastric region; bowel sounds normal; no masses,  no organomegaly    MAU Course  Procedures  CBC/CMP Consulted with Dr. Ivar Drape [place] and now assumes care of pt.  Assessment and Plan    [facility] 10/28/2011, 5:07 AM

## 2011-10-29 ENCOUNTER — Inpatient Hospital Stay (HOSPITAL_COMMUNITY): Payer: 59

## 2011-10-29 LAB — GC/CHLAMYDIA PROBE AMP, GENITAL: Chlamydia, DNA Probe: NEGATIVE

## 2011-10-29 MED ORDER — IOHEXOL 300 MG/ML  SOLN
100.0000 mL | Freq: Once | INTRAMUSCULAR | Status: AC | PRN
  Administered 2011-10-29: 100 mL via INTRAVENOUS

## 2011-10-29 MED ORDER — METFORMIN HCL 500 MG PO TABS
1000.0000 mg | ORAL_TABLET | Freq: Every day | ORAL | Status: DC
  Filled 2011-10-29 (×2): qty 2

## 2011-10-29 NOTE — Progress Notes (Signed)
HD#2:  Abdominal pain, febrile illness, postop from hysteroscopic resection of polyp and D&C.  Patient states she feels sick.  Does not have significant appetite.  Vomiting on day of admission, but only mild nausea since.  + abdominal pain but controlled with percocet.  Admission exam per Dr. Richardson Dopp with uterine tenderness.  No malodorous vaginal d/c.  No white count elevation.  + fevers at home to 103 per patient.  Most recent elevation to 100.9 this a.m. Prior to patient exam.  U/a negative.  Blood cultures pending.    Patient states she has been coughing up thick sputum for several days.  No change in bowel habits.  + bm's and + flatus currently.    VSS.  Tcurrent 98.9.  100.9 at 0613. Diffuse abd tenderness, in luq and bilateral lower quads.  No guarding.  No rebound.     Neg homan's; no other evid of dvt.  Ted hose on.    A/P:  Abdominal pain, febrile illness, anorexia s/p D&C/hysteroscopy for postmenopausal bleeding.  Fever w/u included u/a and blood cultures; currently negative.  CXR ordered given h/o sputum production.  Consider abscess if patient was incidentally perforated during case.  I do not believe to be a reasonable possibility; hysteroscopic polypectomy was done under hysteroscopic guidance and visualization.  Further, during D&C, instruments were bent to conform with the anteverted nature of uterus and fundus was noted during each pass of the sharp curette.  However, CT abd pelvis will not only evaluate for this, but also determine if there is an abscess or possible endometritis.    I noted patient is on metformin and I discussed this Dr. Kyung Rudd of Radiololgy.  I asked for her recommendations.  She stated that it would be ok to order the CT with contrast, but that Radiology would give the patient a lower amt of contrast and she would request that the patient be hydrated afterward.  Patient is receiving IVF currently.  The fact of patient's metformin therapy was added in a text addendum  note to the CT scan order.    Continue on Unasyn/flagyl until afebrile 24 hours.  Will consider patient's 100.9 temp fever and will continue to observe for at least 24 more hours since that time.  If clinically improved, will d/c home with antibiotics for 10-14 day course.

## 2011-10-29 NOTE — Progress Notes (Signed)
UR chart review completed.  

## 2011-10-30 LAB — GLUCOSE, CAPILLARY
Glucose-Capillary: 95 mg/dL (ref 70–99)
Glucose-Capillary: 102 mg/dL — ABNORMAL HIGH (ref 70–99)
Glucose-Capillary: 89 mg/dL (ref 70–99)

## 2011-10-30 NOTE — Progress Notes (Signed)
HD#3:   Patient states she feels improved with less pain.  No n/v but patient does c/o metallic taste in mouth and decreased appetite.  Tolerating po's, but not eating a significant amount.  + flatus/bm's.    T max 100.2 today at 4 p.m.  Otherwise vss. CTAB RRR Abd:  NABS, non-tender, nondistended.  No rebound. Le's:  Neg homan's.  No evid dvt.  + ted hose present.  A/P:  HD 3; febrile illness s/p hysteroscopy/D&C.  Will keep patient for additional 24 hours observation.  Will encourage increase po's.  Metallic taste possibly due to flagyl.  CT scan is neg except for 2 small reactive nodes.  Further, CXR negative.  If afebrile > 24 hours, then consider d/c home.  Consider change to clindamycin tomorrow or as outpatient for antibiotic coverage given possible intolerance to flagyl.  Patient appears to rest comfortably when I am not in room, but listening to patient with door ajar.  However, upon entering room, patient's distress appears to increase.  Possible secondary gain issues playing roll in current symptoms; however, patient does have sign in form of h/o fevers to indicate need for continued admission.  If improved in a.m. And afebrile, and appetite improved, consider d/c home tomorrow.

## 2011-10-31 NOTE — Progress Notes (Signed)
D/C summary dictated:  [id number]

## 2011-10-31 NOTE — Progress Notes (Signed)
HD#4:  Patient without pain today.  No vaginal discharge.  Her main concern today is her inability to eat regularly.  Upon further questioning, patient states that this has been unchanged for 2+ months.  She was, during her w/u for abnormal vaginal bleeding/postmenopausal bleeding, also being evaluated for dysphagia.  She apparently had a scheduled EGD prior to having the hysteroscopy D&C scheduled and performed.  Patient states that she cannot tolerate ibuprofen but can take percocet/vicodin without problem.  Patient has recently refused her bp meds because of difficulty with swallowing, which, again, has been unchanged for approx 2 months.  Patient otherwise feels improved from when she was admitted with resolution of abdominal pain.    AFVSS.  + low grade temps of 100.0.  No temps above 38 for greater than 36 hours.   RRR CTAB Abd:  Soft, nontender, nondistended.  Obese.  No guarding.  No rebound.  When hands removed from abdomen quickly, patient is startled but she feels this is because bladder is full and she didn't want to leak.  No suprapubic tenderness. Lower ext:  Neg homans.  + ted hose.    A/P:  D/c home.  I have discussed the need for f/u with [facility] this week.  Patient agrees to f/u on Friday with Dr. Neva Seat.  Further, I discussed need to take abx at home for 7 more days (total approx 10 days treatment).  Pt received rx for ceftin q6 and flagyl bid for 7 days.  Patient given strict precautions.  Patient also to call her GI physician, Dr. Tomasita Morrow (?) to schedule EGD or further evaluation for dysphagia.  No acute urinary, abdominal, or pulmonary process.  Exam on admission c/w endometritis, now apparently resolved.  Plan d/c home today.  Patient is amenable to this and desires d/c.

## 2011-10-31 NOTE — Progress Notes (Signed)
Pt discharged to home with husband.  Condition stable.  Pt to car via wheelchair with C. Clapp, NT.  No equipment for home ordered at discharge. 

## 2011-10-31 NOTE — Progress Notes (Signed)
UR chart review completed.  

## 2011-11-01 ENCOUNTER — Encounter (HOSPITAL_COMMUNITY): Payer: Self-pay | Admitting: *Deleted

## 2011-11-01 ENCOUNTER — Other Ambulatory Visit: Payer: Self-pay

## 2011-11-01 ENCOUNTER — Inpatient Hospital Stay (HOSPITAL_COMMUNITY)
Admission: EM | Admit: 2011-11-01 | Discharge: 2011-11-03 | DRG: 641 | Disposition: A | Discharge status: Home / Self Care | Payer: 59 | Attending: Internal Medicine | Admitting: Internal Medicine

## 2011-11-01 DIAGNOSIS — E785 Hyperlipidemia, unspecified: Secondary | ICD-10-CM | POA: Diagnosis present

## 2011-11-01 DIAGNOSIS — N719 Inflammatory disease of uterus, unspecified: Secondary | ICD-10-CM | POA: Diagnosis present

## 2011-11-01 DIAGNOSIS — E876 Hypokalemia: Principal | ICD-10-CM | POA: Diagnosis present

## 2011-11-01 DIAGNOSIS — E669 Obesity, unspecified: Secondary | ICD-10-CM | POA: Diagnosis present

## 2011-11-01 DIAGNOSIS — N809 Endometriosis, unspecified: Secondary | ICD-10-CM | POA: Diagnosis present

## 2011-11-01 DIAGNOSIS — I10 Essential (primary) hypertension: Secondary | ICD-10-CM | POA: Diagnosis present

## 2011-11-01 DIAGNOSIS — R112 Nausea with vomiting, unspecified: Secondary | ICD-10-CM | POA: Diagnosis present

## 2011-11-01 DIAGNOSIS — E119 Type 2 diabetes mellitus without complications: Secondary | ICD-10-CM | POA: Diagnosis present

## 2011-11-01 DIAGNOSIS — R5381 Other malaise: Secondary | ICD-10-CM | POA: Diagnosis present

## 2011-11-01 DIAGNOSIS — R531 Weakness: Secondary | ICD-10-CM | POA: Diagnosis present

## 2011-11-01 DIAGNOSIS — R197 Diarrhea, unspecified: Secondary | ICD-10-CM | POA: Diagnosis present

## 2011-11-01 DIAGNOSIS — R748 Abnormal levels of other serum enzymes: Secondary | ICD-10-CM | POA: Diagnosis present

## 2011-11-01 HISTORY — DX: Encounter for other specified aftercare: Z51.89

## 2011-11-01 HISTORY — DX: Reserved for inherently not codable concepts without codable children: IMO0001

## 2011-11-01 LAB — URINALYSIS, ROUTINE W REFLEX MICROSCOPIC
Glucose, UA: NEGATIVE mg/dL
Protein, ur: >300 mg/dL — AB
Specific Gravity, Urine: 1.021 (ref 1.005–1.030)
Urobilinogen, UA: 0.2 mg/dL (ref 0.0–1.0)

## 2011-11-01 LAB — DIFFERENTIAL
Basophils Absolute: 0 10*3/uL (ref 0.0–0.1)
Basophils Relative: 1 % (ref 0–1)
Eosinophils Absolute: 0 10*3/uL (ref 0.0–0.7)
Eosinophils Relative: 0 % (ref 0–5)
Lymphocytes Relative: 38 % (ref 12–46)
Monocytes Absolute: 0.3 10*3/uL (ref 0.1–1.0)

## 2011-11-01 LAB — COMPREHENSIVE METABOLIC PANEL
ALT: 23 U/L (ref 0–35)
AST: 49 U/L — ABNORMAL HIGH (ref 0–37)
Albumin: 3.5 g/dL (ref 3.5–5.2)
Calcium: 8.8 mg/dL (ref 8.4–10.5)
Creatinine, Ser: 0.8 mg/dL (ref 0.50–1.10)
GFR calc non Af Amer: 76 mL/min — ABNORMAL LOW (ref >90)
Sodium: 138 mEq/L (ref 135–145)
Total Protein: 7.1 g/dL (ref 6.0–8.3)

## 2011-11-01 LAB — URINE MICROSCOPIC-ADD ON

## 2011-11-01 LAB — CBC
HCT: 29.6 % — ABNORMAL LOW (ref 36.0–46.0)
MCH: 27.9 pg (ref 26.0–34.0)
MCHC: 34.5 g/dL (ref 30.0–36.0)
MCV: 80.9 fL (ref 78.0–100.0)
Platelets: 176 10*3/uL (ref 150–400)
RDW: 15.1 % (ref 11.5–15.5)
WBC: 3.1 10*3/uL — ABNORMAL LOW (ref 4.0–10.5)

## 2011-11-01 MED ORDER — POTASSIUM CHLORIDE 10 MEQ/100ML IV SOLN
10.0000 meq | Freq: Once | INTRAVENOUS | Status: AC
  Administered 2011-11-01: 10 meq via INTRAVENOUS
  Filled 2011-11-01: qty 100

## 2011-11-01 MED ORDER — SODIUM CHLORIDE 0.9 % IV BOLUS (SEPSIS)
1000.0000 mL | Freq: Once | INTRAVENOUS | Status: AC
  Administered 2011-11-01: 1000 mL via INTRAVENOUS

## 2011-11-01 MED ORDER — ONDANSETRON HCL 4 MG/2ML IJ SOLN
4.0000 mg | Freq: Once | INTRAMUSCULAR | Status: AC
  Administered 2011-11-01: 4 mg via INTRAVENOUS
  Filled 2011-11-01: qty 2

## 2011-11-01 NOTE — Discharge Summary (Signed)
Miranda Francis, Miranda Francis             ACCOUNT NO.:  [id number]  MEDICAL RECORD NO.:  [id number]  LOCATION:  9305                          FACILITY:  [facility]  PHYSICIAN:  Pricilla Holm, MD      DATE OF BIRTH:  [birth year]  DATE OF ADMISSION:  10/28/2011 DATE OF DISCHARGE:  10/31/2011                              DISCHARGE SUMMARY   PRINCIPAL DIAGNOSES: 1. Febrile illness of unclear etiology. 2. Abdominal pain, nausea, and vomiting. 3. Status post recent hysteroscopy, D and C on October 22, 2011.  HOSPITAL COURSE:  Please see history and physical for further information regarding the patient's admission.  The patient was admitted on October 28, 2011, with complaints of severe abdominopelvic pain and fever at home of 102 degrees.  She states that the Vicodin that she was prescribed only partially relieved her pain. She states her pain is 10/10.  The patient underwent a transabdominal and transvaginal ultrasound, which demonstrated no discernible etiology for the patient's condition.  Several intramural fibroids were noted on ultrasound.  Of note, the patient did receive 100 mg of IV doxycycline on call to the OR prior to her hysteroscopy and hysteroscopic resection of uterine polyp as well as the dilation and curettage.  On admission, the patient's labs were grossly within normal limits.  She did have a normal white blood cell counts of 5.3.  A wet prep did demonstrate a few white blood cells.  The remainder of the patient's labs were grossly within normal limits.  This includes her urinalysis. The patient was further evaluated for completion of her fever workup including blood cultures, a chest x-ray, and CT scan of the abdomen and pelvis.  These appeared to be negative to date.  The patient additionally had a GC and Chlamydia test done that was also negative. By hospital day #4, the patient and I discussed her exact symptoms.  She stated that her abdominal pain by that time had  significantly improved. Also, for the 24 hours prior to the noon hour on the day of discharge, she did not have any fever or temperature greater than 38 degrees.  She did have several increases in her temperature to maximum of 100.2, within the 36 hours prior to her discharge.  The patient was placed on both Flagyl and Unasyn for broad spectrum antibiotic therapy.  The rationale for this was the patient's exam on admission, which would possibly be consistent with endometritis given increased amount of tenderness during her exam.  By the day of her discharge, the patient did, again, state that her pain had significantly improved.  Her main complaint on the day of discharge was that she had dysphagia.  She stated that swallowing was increasingly difficult for her, and she had interrupted her dysphagia workup secondary to her workup for postmenopausal bleeding.  She stated that she had canceled what sounds like an EGD with Dr. __________.  I have instructed the patient to call her office to reschedule that procedure for evaluation of the patient's dysphagia.  Further, I encouraged the patient to follow up with Dr. Arlyce Harman on Friday after discharge for a brief postop visit.  I prescribed the patient both Flagyl and Ceftin.  I gave her an additional 7 days for a total of 10 days of antibiotic therapy.  The patient was informed of her 10:15 appointment on Friday after discharge with Dr. Neva Seat.  The patient was given strict precautions with regard to when to call and follow up.          ______________________________ Pricilla Holm, MD     RB/MEDQ  D:  10/31/2011  T:  11/01/2011  Job:  [id number]

## 2011-11-01 NOTE — H&P (Signed)
PCP:   No primary provider on file. sees a Dr. Britt Bottom in [place]  Chief Complaint:  Weakness, leg cramps, nausea, vomiting, diarrhea  HPI: Patient is a 65 year old obese black woman with a history of hypertension, hyperlipidemia, diabetes, who was just recently discharged from [facility] hospital yesterday with a diagnosis of endometritis. It appears that on December 10 she had a hysteroscopy with D&C for vaginal bleeding. And required readmission to the hospital on December 16 for fever and abdominal pain and at that point was diagnosed with presumptive endometritis placed on IV antibiotics and subsequently discharged home yesterday on Flagyl and Ceftin. Going back in her history to about 6 weeks ago she was newly diagnosed with diabetes and started on metformin. Ever since starting the metformin she has been having difficulty eating secondary to nausea. She states that she does not have pain with swallowing or difficulty swallowing it's more that everything she eats causes her to be nauseous. She has actually scheduled an appointment with a gastroenterologist in [place] in early January because of these issues. In the emergency department she is found to have a potassium of 2.3 and we are asked to admit her for further evaluation and management.  Allergies:  No Known Allergies    Past Medical History  Diagnosis Date  . Hypertension   . Heart murmur   . Diabetes mellitus     Past Surgical History  Procedure Date  . No past surgeries   . Dilation and curettage of uterus     Prior to Admission medications   Medication Sig Start Date End Date Taking? Authorizing Provider  aspirin 81 MG chewable tablet Chew 81 mg by mouth daily.     Yes Historical Provider, MD  cefUROXime (CEFTIN) 500 MG tablet Take 500 mg by mouth every 6 (six) hours.   11/01/11  Yes Historical Provider, MD  esomeprazole (NEXIUM) 40 MG capsule Take 40 mg by mouth daily before breakfast.     Yes Historical Provider, MD    HYDROcodone-acetaminophen (VICODIN) 5-500 MG per tablet Take 1 tablet by mouth every 6 (six) hours as needed. Takes for pain    Yes Historical Provider, MD  losartan-hydrochlorothiazide (HYZAAR) 100-25 MG per tablet Take 1 tablet by mouth daily.     Yes Historical Provider, MD  metFORMIN (GLUCOPHAGE-XR) 500 MG 24 hr tablet Take 1,000 mg by mouth at bedtime.     Yes Historical Provider, MD  metroNIDAZOLE (FLAGYL) 500 MG tablet Take 500 mg by mouth 2 (two) times daily.   11/01/11  Yes Historical Provider, MD  potassium chloride SA (K-DUR,KLOR-CON) 20 MEQ tablet Take 20 mEq by mouth 2 (two) times daily.     Yes Historical Provider, MD  simvastatin (ZOCOR) 40 MG tablet Take 40 mg by mouth at bedtime.     Yes Historical Provider, MD  Vitamin D, Ergocalciferol, (DRISDOL) 50000 UNITS CAPS Take 50,000 Units by mouth every 7 (seven) days.     Yes Historical Provider, MD  zolpidem (AMBIEN) 10 MG tablet Take 10 mg by mouth at bedtime as needed. For sleep    Yes Historical Provider, MD    Social History:  reports that she has never smoked. She has never used smokeless tobacco. She reports that she does not drink alcohol or use illicit drugs.  Family History  Problem Relation Age of Onset  . Diabetes type II Mother   . Diabetes type II Sister     Review of Systems:  Negative except as mentioned in  history of present illness.  Physical Exam: Blood pressure 147/80, pulse 65, temperature 100 F (37.8 C), temperature source Oral, resp. rate 20, SpO2 96.00%. General: Alert, awake, oriented x3. HEENT: Normocephalic, atraumatic, pupils equal round and reactive to light, intact extraocular movements, extremely dry mucous membranes with cracked lips and tongue. Neck: Supple, no JVD, no lymphadenopathy, no bruits, no goiter. Cardiovascular: Regular rate and rhythm, no murmurs, rubs or gallops. Lungs: Clear to auscultation bilaterally. Abdomen: Obese, soft, nondistended, positive bowel sounds, slightly  tender to deep palpation of the suprapubic area, no masses or organomegaly noted. Extremities: No clubbing, cyanosis or edema, positive pedal pulses. Neurologic: Grossly intact and nonfocal.  Labs on Admission:  Results for orders placed during the hospital encounter of 11/01/11 (from the past 48 hour(s))  GLUCOSE, CAPILLARY     Status: Normal   Collection Time   11/01/11  5:31 PM      Component Value Range Comment   Glucose-Capillary 95  70 - 99 (mg/dL)   CBC     Status: Abnormal   Collection Time   11/01/11  8:00 PM      Component Value Range Comment   WBC 3.1 (*) 4.0 - 10.5 (K/uL)    RBC 3.66 (*) 3.87 - 5.11 (MIL/uL)    Hemoglobin 10.2 (*) 12.0 - 15.0 (g/dL)    HCT 96.0 (*) 45.4 - 46.0 (%)    MCV 80.9  78.0 - 100.0 (fL)    MCH 27.9  26.0 - 34.0 (pg)    MCHC 34.5  30.0 - 36.0 (g/dL)    RDW 09.8  11.9 - 14.7 (%)    Platelets 176  150 - 400 (K/uL)   DIFFERENTIAL     Status: Abnormal   Collection Time   11/01/11  8:00 PM      Component Value Range Comment   Neutrophils Relative 52  43 - 77 (%)    Neutro Abs 1.6 (*) 1.7 - 7.7 (K/uL)    Lymphocytes Relative 38  12 - 46 (%)    Lymphs Abs 1.2  0.7 - 4.0 (K/uL)    Monocytes Relative 9  3 - 12 (%)    Monocytes Absolute 0.3  0.1 - 1.0 (K/uL)    Eosinophils Relative 0  0 - 5 (%)    Eosinophils Absolute 0.0  0.0 - 0.7 (K/uL)    Basophils Relative 1  0 - 1 (%)    Basophils Absolute 0.0  0.0 - 0.1 (K/uL)   COMPREHENSIVE METABOLIC PANEL     Status: Abnormal   Collection Time   11/01/11  8:00 PM      Component Value Range Comment   Sodium 138  135 - 145 (mEq/L)    Potassium 2.3 (*) 3.5 - 5.1 (mEq/L)    Chloride 95 (*) 96 - 112 (mEq/L)    CO2 28  19 - 32 (mEq/L)    Glucose, Bld 89  70 - 99 (mg/dL)    BUN 8  6 - 23 (mg/dL)    Creatinine, Ser 8.29  0.50 - 1.10 (mg/dL)    Calcium 8.8  8.4 - 10.5 (mg/dL)    Total Protein 7.1  6.0 - 8.3 (g/dL)    Albumin 3.5  3.5 - 5.2 (g/dL)    AST 49 (*) 0 - 37 (U/L)    ALT 23  0 - 35 (U/L)     Alkaline Phosphatase 76  39 - 117 (U/L)    Total Bilirubin 0.6  0.3 - 1.2 (mg/dL)  GFR calc non Af Amer 76 (*) >90 (mL/min)    GFR calc Af Amer 88 (*) >90 (mL/min)   LIPASE, BLOOD     Status: Abnormal   Collection Time   11/01/11  8:00 PM      Component Value Range Comment   Lipase 147 (*) 11 - 59 (U/L)   URINALYSIS, ROUTINE W REFLEX MICROSCOPIC     Status: Abnormal   Collection Time   11/01/11  8:36 PM      Component Value Range Comment   Color, Urine AMBER (*) YELLOW  BIOCHEMICALS MAY BE AFFECTED BY COLOR   APPearance CLOUDY (*) CLEAR     Specific Gravity, Urine 1.021  1.005 - 1.030     pH 7.0  5.0 - 8.0     Glucose, UA NEGATIVE  NEGATIVE (mg/dL)    Hgb urine dipstick MODERATE (*) NEGATIVE     Bilirubin Urine SMALL (*) NEGATIVE     Ketones, ur >80 (*) NEGATIVE (mg/dL)    Protein, ur >161 (*) NEGATIVE (mg/dL)    Urobilinogen, UA 0.2  0.0 - 1.0 (mg/dL)    Nitrite NEGATIVE  NEGATIVE     Leukocytes, UA SMALL (*) NEGATIVE    URINE MICROSCOPIC-ADD ON     Status: Abnormal   Collection Time   11/01/11  8:36 PM      Component Value Range Comment   Squamous Epithelial / LPF MANY (*) RARE     WBC, UA 3-6  <3 (WBC/hpf)    RBC / HPF 21-50  <3 (RBC/hpf)    Bacteria, UA FEW (*) RARE      Radiological Exams on Admission: No results found.  Assessment/Plan Principal Problem:  *Hypokalemia Active Problems:  Weakness generalized  Serum lipase elevation  Nausea & vomiting  Diarrhea  Endometritis  DM (diabetes mellitus)  HTN (hypertension)  Hyperlipidemia  Obesity   #1 hypokalemia: I suspect this is related to her ongoing GI losses. Will replete IV for now. Check a magnesium level. An EKG has not yet been done so we'll order one stat. We'll admit her as an observation to a telemetry unit.  #2 generalized weakness: Again I suspect this is related to her hypokalemia and showed improve with repletion.  #3 nausea, vomiting, diarrhea: I strongly suspect that this is secondary to  the metformin that was started about 6 weeks ago. She clearly states a direct cause and effect relationship between the starting of the metformin and the initiation of her symptoms. I will stop the metformin immediately. Treat her supportively with IV fluids and anti-emetics and follow.  #4 elevated serum lipase: I do not believe she has clinical acute pancreatitis given lack of abdominal pain. She does have some nausea and vomiting but again I do suspect that this is related to the metformin.  #5 endometritis: She was just discharged from [facility] hospital yesterday with this diagnosis. She was supposed to complete a seven-day course of Flagyl and Ceftin which I will continue while in the hospital.  #6 diabetes mellitus: Check a hemoglobin A1c. Discontinue metformin for above-mentioned reasons. Start her on sliding scale insulin.  #7 hyperlipidemia: Continue statin and check a fasting lipid profile.  #8 DVT prophylaxis: Given recent vaginal bleeding and uterine instrumentation, we'll use SCDs.  Time Spent on Admission: 65 minutes.  Chaya Jan [facility] Pager: [phone] 11/01/2011, 9:58 PM

## 2011-11-01 NOTE — ED Notes (Signed)
K still irritating patient, turned down to 25 ml/hr

## 2011-11-01 NOTE — ED Provider Notes (Signed)
History     CSN: [id number]  Arrival date & time 11/01/11  1408   First MD Initiated Contact with Patient 11/01/11 1944      Chief Complaint  Patient presents with  . Weakness    pt reports having a d&c and polyp removed december 10th, states had infection was admitted to [facility] hospital and discharged on yesterday. states that "I can't eat." reports not eating since december 9th.     (Consider location/radiation/quality/duration/timing/severity/associated sxs/prior treatment) Patient is a 65 y.o. female presenting with weakness. The history is provided by the patient.  Weakness  Additional symptoms include weakness.   patient here with having dysphasia x2+ months. Patient is scheduled to have an EGD done 2 days ago but didn't keep her appointment do to the fact that she just had GYN surgery. Patient is able to drink liquids and is only emesis x1. Patient is having some trouble swallowing her pills. Patient notes that she does feel weak with some chills however denies fever at this time. Swallowing makes her symptoms worse and nothing makes them better  Past Medical History  Diagnosis Date  . Hypertension   . Heart murmur   . Diabetes mellitus     Past Surgical History  Procedure Date  . No past surgeries   . Dilation and curettage of uterus     History reviewed. No pertinent family history.  History  Substance Use Topics  . Smoking status: Never Smoker   . Smokeless tobacco: Never Used  . Alcohol Use: No    OB History    Grav Para Term Preterm Abortions TAB SAB Ect Mult Living                  Review of Systems  Neurological: Positive for weakness.  All other systems reviewed and are negative.    Allergies  Review of patient's allergies indicates no known allergies.  Home Medications   Current Outpatient Rx  Name Route Sig Dispense Refill  . ASPIRIN 81 MG PO CHEW Oral Chew 81 mg by mouth daily.      Marland Kitchen CEFUROXIME AXETIL 500 MG PO TABS Oral Take 500 mg  by mouth every 6 (six) hours.      Marland Kitchen ESOMEPRAZOLE MAGNESIUM 40 MG PO CPDR Oral Take 40 mg by mouth daily before breakfast.      . HYDROCODONE-ACETAMINOPHEN 5-500 MG PO TABS Oral Take 1 tablet by mouth every 6 (six) hours as needed. Takes for pain     . LOSARTAN POTASSIUM-HCTZ 100-25 MG PO TABS Oral Take 1 tablet by mouth daily.      Marland Kitchen METFORMIN HCL ER 500 MG PO TB24 Oral Take 1,000 mg by mouth at bedtime.      Marland Kitchen METRONIDAZOLE 500 MG PO TABS Oral Take 500 mg by mouth 2 (two) times daily.      Marland Kitchen POTASSIUM CHLORIDE CRYS CR 20 MEQ PO TBCR Oral Take 20 mEq by mouth 2 (two) times daily.      Marland Kitchen SIMVASTATIN 40 MG PO TABS Oral Take 40 mg by mouth at bedtime.      Marland Kitchen VITAMIN D (ERGOCALCIFEROL) 50000 UNITS PO CAPS Oral Take 50,000 Units by mouth every 7 (seven) days.      Marland Kitchen ZOLPIDEM TARTRATE 10 MG PO TABS Oral Take 10 mg by mouth at bedtime as needed. For sleep       BP 128/81  Pulse 72  Temp(Src) 98.7 F (37.1 C) (Oral)  Resp 20  SpO2 100%  Physical Exam  Nursing note and vitals reviewed. Constitutional: She is oriented to person, place, and time. She appears well-developed and well-nourished.  Non-toxic appearance. No distress.  HENT:  Head: Normocephalic and atraumatic.  Eyes: Conjunctivae, EOM and lids are normal. Pupils are equal, round, and reactive to light.  Neck: Normal range of motion. Neck supple. No tracheal deviation present. No mass present.  Cardiovascular: Normal rate, regular rhythm and normal heart sounds.  Exam reveals no gallop.   No murmur heard. Pulmonary/Chest: Effort normal and breath sounds normal. No stridor. No respiratory distress. She has no decreased breath sounds. She has no wheezes. She has no rhonchi. She has no rales.  Abdominal: Soft. Normal appearance and bowel sounds are normal. She exhibits no distension. There is no tenderness. There is no rebound and no CVA tenderness.  Musculoskeletal: Normal range of motion. She exhibits no edema and no tenderness.    Neurological: She is alert and oriented to person, place, and time. She has normal strength. No cranial nerve deficit or sensory deficit. GCS eye subscore is 4. GCS verbal subscore is 5. GCS motor subscore is 6.  Skin: Skin is warm and dry. No abrasion and no rash noted.  Psychiatric: She has a normal mood and affect. Her speech is normal and behavior is normal.    ED Course  Procedures (including critical care time)   Labs Reviewed  GLUCOSE, CAPILLARY  CBC  DIFFERENTIAL  COMPREHENSIVE METABOLIC PANEL  LIPASE, BLOOD   No results found.   No diagnosis found.    MDM  Patient given IV fluids here. Patient's potassium was checked and was low at 2.3. Patient also has a mild elevated lipase. Patient will be admitted for potassium replenishment. She was given potassium 10 mEq IV piggyback here. Spoke with Dr. Lily Peer from the hospitalist service and they will admit the patient        Toy Baker, MD 11/01/11 2128

## 2011-11-02 ENCOUNTER — Other Ambulatory Visit: Payer: Self-pay

## 2011-11-02 ENCOUNTER — Encounter (HOSPITAL_COMMUNITY): Payer: Self-pay | Admitting: *Deleted

## 2011-11-02 LAB — HEMOGLOBIN A1C
Hgb A1c MFr Bld: 7.4 % — ABNORMAL HIGH (ref <5.7)
Mean Plasma Glucose: 166 mg/dL — ABNORMAL HIGH (ref <117)

## 2011-11-02 LAB — BASIC METABOLIC PANEL
GFR calc Af Amer: 86 mL/min — ABNORMAL LOW (ref >90)
GFR calc non Af Amer: 74 mL/min — ABNORMAL LOW (ref >90)
Potassium: 2.5 mEq/L — CL (ref 3.5–5.1)
Sodium: 138 mEq/L (ref 135–145)

## 2011-11-02 LAB — LIPID PANEL
HDL: 37 mg/dL — ABNORMAL LOW (ref >39)
Triglycerides: 93 mg/dL (ref <150)

## 2011-11-02 LAB — GLUCOSE, CAPILLARY
Glucose-Capillary: 91 mg/dL (ref 70–99)
Glucose-Capillary: 90 mg/dL (ref 70–99)

## 2011-11-02 LAB — MAGNESIUM: Magnesium: 1.7 mg/dL (ref 1.5–2.5)

## 2011-11-02 LAB — POTASSIUM: Potassium: 2.7 mEq/L — CL (ref 3.5–5.1)

## 2011-11-02 LAB — CBC
Hemoglobin: 9.2 g/dL — ABNORMAL LOW (ref 12.0–15.0)
Platelets: 169 10*3/uL (ref 150–400)
RBC: 3.37 MIL/uL — ABNORMAL LOW (ref 3.87–5.11)

## 2011-11-02 MED ORDER — SENNOSIDES-DOCUSATE SODIUM 8.6-50 MG PO TABS
1.0000 | ORAL_TABLET | Freq: Every evening | ORAL | Status: DC | PRN
  Filled 2011-11-02: qty 1

## 2011-11-02 MED ORDER — CEFUROXIME AXETIL 500 MG PO TABS
500.0000 mg | ORAL_TABLET | Freq: Four times a day (QID) | ORAL | Status: DC
  Administered 2011-11-02 – 2011-11-03 (×6): 500 mg via ORAL
  Filled 2011-11-02 (×9): qty 1

## 2011-11-02 MED ORDER — ACETAMINOPHEN 650 MG RE SUPP: 650.0000 mg | Freq: Four times a day (QID) | RECTAL | Status: DC | PRN

## 2011-11-02 MED ORDER — ACETAMINOPHEN 325 MG PO TABS: 650.0000 mg | ORAL_TABLET | Freq: Four times a day (QID) | ORAL | Status: DC | PRN

## 2011-11-02 MED ORDER — OXYCODONE HCL 5 MG PO TABS: 5.0000 mg | ORAL_TABLET | ORAL | Status: DC | PRN

## 2011-11-02 MED ORDER — PANTOPRAZOLE SODIUM 40 MG IV SOLR
40.0000 mg | Freq: Every day | INTRAVENOUS | Status: DC
  Administered 2011-11-02 (×2): 40 mg via INTRAVENOUS
  Filled 2011-11-02 (×3): qty 40

## 2011-11-02 MED ORDER — POTASSIUM CHLORIDE 10 MEQ/100ML IV SOLN
10.0000 meq | INTRAVENOUS | Status: AC
  Administered 2011-11-02 (×5): 10 meq via INTRAVENOUS
  Filled 2011-11-02 (×6): qty 100

## 2011-11-02 MED ORDER — SIMVASTATIN 40 MG PO TABS
40.0000 mg | ORAL_TABLET | Freq: Every day | ORAL | Status: DC
  Administered 2011-11-02: 40 mg via ORAL
  Filled 2011-11-02 (×2): qty 1

## 2011-11-02 MED ORDER — VITAMIN D (ERGOCALCIFEROL) 1.25 MG (50000 UNIT) PO CAPS
50000.0000 [IU] | ORAL_CAPSULE | ORAL | Status: DC
  Administered 2011-11-02: 50000 [IU] via ORAL
  Filled 2011-11-02: qty 1

## 2011-11-02 MED ORDER — INSULIN ASPART 100 UNIT/ML ~~LOC~~ SOLN
6.0000 [IU] | Freq: Three times a day (TID) | SUBCUTANEOUS | Status: DC
  Administered 2011-11-03: 6 [IU] via SUBCUTANEOUS

## 2011-11-02 MED ORDER — ONDANSETRON HCL 4 MG/2ML IJ SOLN
4.0000 mg | Freq: Four times a day (QID) | INTRAMUSCULAR | Status: DC | PRN
  Administered 2011-11-02: 4 mg via INTRAVENOUS
  Filled 2011-11-02: qty 2

## 2011-11-02 MED ORDER — SODIUM CHLORIDE 0.9 % IV SOLN
INTRAVENOUS | Status: DC
  Administered 2011-11-02: 06:00:00 via INTRAVENOUS

## 2011-11-02 MED ORDER — METRONIDAZOLE 500 MG PO TABS
500.0000 mg | ORAL_TABLET | Freq: Two times a day (BID) | ORAL | Status: DC
  Administered 2011-11-02 – 2011-11-03 (×4): 500 mg via ORAL
  Filled 2011-11-02 (×5): qty 1

## 2011-11-02 MED ORDER — POTASSIUM CHLORIDE 10 MEQ/100ML IV SOLN
10.0000 meq | INTRAVENOUS | Status: AC
  Administered 2011-11-02 – 2011-11-03 (×6): 10 meq via INTRAVENOUS
  Filled 2011-11-02 (×6): qty 100

## 2011-11-02 MED ORDER — SODIUM CHLORIDE 0.9 % IV SOLN: INTRAVENOUS | Status: DC

## 2011-11-02 MED ORDER — ONDANSETRON HCL 4 MG PO TABS: 4.0000 mg | ORAL_TABLET | Freq: Four times a day (QID) | ORAL | Status: DC | PRN

## 2011-11-02 MED ORDER — POTASSIUM CHLORIDE CRYS ER 20 MEQ PO TBCR
40.0000 meq | EXTENDED_RELEASE_TABLET | ORAL | Status: AC
  Administered 2011-11-02 – 2011-11-03 (×2): 40 meq via ORAL
  Filled 2011-11-02 (×2): qty 2

## 2011-11-02 MED ORDER — ZOLPIDEM TARTRATE 10 MG PO TABS
10.0000 mg | ORAL_TABLET | Freq: Every evening | ORAL | Status: DC | PRN
  Administered 2011-11-02: 10 mg via ORAL
  Filled 2011-11-02 (×2): qty 1

## 2011-11-02 MED ORDER — INSULIN ASPART 100 UNIT/ML ~~LOC~~ SOLN
0.0000 [IU] | Freq: Three times a day (TID) | SUBCUTANEOUS | Status: DC
  Filled 2011-11-02: qty 3

## 2011-11-02 MED ORDER — POTASSIUM CHLORIDE CRYS ER 20 MEQ PO TBCR
40.0000 meq | EXTENDED_RELEASE_TABLET | ORAL | Status: AC
  Administered 2011-11-02 (×3): 40 meq via ORAL
  Filled 2011-11-02 (×3): qty 2

## 2011-11-02 NOTE — Progress Notes (Signed)
Alert value for potassium is 2.7 results text paged to Dr Kerry Hough.

## 2011-11-02 NOTE — Progress Notes (Signed)
UR CHART REVIEWED; B Kahiau Schewe RN, BSN, MHA 

## 2011-11-02 NOTE — Progress Notes (Signed)
Subjective: Nausea improved, tolerating food today.  Objective: Vital signs in last 24 hours: Temp:  [98.4 F (36.9 C)-100 F (37.8 C)] 98.4 F (36.9 C) (12/21 1417) Pulse Rate:  [62-71] 71  (12/21 1417) Resp:  [18-20] 18  (12/21 1417) BP: (112-147)/(54-80) 112/54 mmHg (12/21 1417) SpO2:  [94 %-96 %] 96 % (12/21 1417) Weight:  [95.6 kg (210 lb 12.2 oz)] 210 lb 12.2 oz (95.6 kg) (12/21 0100) Weight change:  Last BM Date: 11/01/11  Intake/Output from previous day: 12/20 0701 - 12/21 0700 In: -  Out: 200 [Urine:200]     Physical Exam: General: Alert, awake, oriented x3, in no acute distress. HEENT: No bruits, no goiter. Heart: Regular rate and rhythm, without murmurs, rubs, gallops. Lungs: Clear to auscultation bilaterally. Abdomen: Soft, nontender, nondistended, positive bowel sounds. Extremities: No clubbing cyanosis or edema with positive pedal pulses. Neuro: Grossly intact, nonfocal.    Lab Results: Basic Metabolic Panel:  Basename 11/02/11 1352 11/02/11 0520 11/01/11 2000  NA -- 138 138  K 2.7* 2.5* --  CL -- 98 95*  CO2 -- 26 28  GLUCOSE -- 82 89  BUN -- 7 8  CREATININE -- 0.82 0.80  CALCIUM -- 8.2* 8.8  MG -- 1.7 --  PHOS -- -- --   Liver Function Tests:  [facility] 11/01/11 2000  AST 49*  ALT 23  ALKPHOS 76  BILITOT 0.6  PROT 7.1  ALBUMIN 3.5    Basename 11/01/11 2000  LIPASE 147*  AMYLASE --   No results found for this basename: AMMONIA:2 in the last 72 hours CBC:  Basename 11/02/11 0520 11/01/11 2000  WBC 2.8* 3.1*  NEUTROABS -- 1.6*  HGB 9.2* 10.2*  HCT 27.4* 29.6*  MCV 81.3 80.9  PLT 169 176   Cardiac Enzymes: No results found for this basename: CKTOTAL:3,CKMB:3,CKMBINDEX:3,TROPONINI:3 in the last 72 hours BNP: No components found with this basename: POCBNP:3 D-Dimer: No results found for this basename: DDIMER:2 in the last 72 hours CBG:  Basename 11/02/11 1620 11/02/11 1140 11/02/11 0745 11/01/11 1731 10/31/11 0840  GLUCAP  90 91 80 95 92   Hemoglobin A1C:  Basename 11/02/11 0520  HGBA1C 7.4*   Fasting Lipid Panel:  Basename 11/02/11 0520  CHOL 138  HDL 37*  LDLCALC 82  TRIG 93  CHOLHDL 3.7  LDLDIRECT --   Thyroid Function Tests: No results found for this basename: TSH,T4TOTAL,FREET4,T3FREE,THYROIDAB in the last 72 hours Anemia Panel: No results found for this basename: VITAMINB12,FOLATE,FERRITIN,TIBC,IRON,RETICCTPCT in the last 72 hours Coagulation: No results found for this basename: LABPROT:2,INR:2 in the last 72 hours Urine Drug Screen: Drugs of Abuse  No results found for this basename: labopia, cocainscrnur, labbenz, amphetmu, thcu, labbarb    Alcohol Level: No results found for this basename: ETH:2 in the last 72 hours  Recent Results (from the past 240 hour(s))  WET PREP, GENITAL     Status: Abnormal   Collection Time   10/28/11  8:50 AM      Component Value Range Status Comment   Yeast, Wet Prep NONE SEEN  NONE SEEN  Final    Trich, Wet Prep NONE SEEN  NONE SEEN  Final    Clue Cells, Wet Prep NONE SEEN  NONE SEEN  Final    WBC, Wet Prep HPF POC FEW (*) NONE SEEN  Final FEW BACTERIA SEEN  CULTURE, BLOOD (ROUTINE X 2)     Status: Normal (Preliminary result)   Collection Time   10/28/11  9:20 AM  Component Value Range Status Comment   Specimen Description BLOOD RIGHT ARM   Final    Special Requests     Final    Value: BOTTLES DRAWN AEROBIC AND ANAEROBIC 10CC BOTH BOTTLES   Setup Time [id number]   Final    Culture     Final    Value:        BLOOD CULTURE RECEIVED NO GROWTH TO DATE CULTURE WILL BE HELD FOR 5 DAYS BEFORE ISSUING A FINAL NEGATIVE REPORT   Report Status PENDING   Incomplete   CULTURE, BLOOD (ROUTINE X 2)     Status: Normal (Preliminary result)   Collection Time   10/28/11  9:23 AM      Component Value Range Status Comment   Specimen Description BLOOD RIGHT ARM   Final    Special Requests     Final    Value: BOTTLES DRAWN AEROBIC AND ANAEROBIC 10CC BOTH  BOTTLES   Setup Time [id number]   Final    Culture     Final    Value:        BLOOD CULTURE RECEIVED NO GROWTH TO DATE CULTURE WILL BE HELD FOR 5 DAYS BEFORE ISSUING A FINAL NEGATIVE REPORT   Report Status PENDING   Incomplete     Studies/Results: No results found.  Medications: Scheduled Meds:   . cefUROXime  500 mg Oral Q6H  . insulin aspart  0-20 Units Subcutaneous TID WC  . insulin aspart  6 Units Subcutaneous TID WC  . metroNIDAZOLE  500 mg Oral BID  . ondansetron  4 mg Intravenous Once  . pantoprazole (PROTONIX) IV  40 mg Intravenous QHS  . potassium chloride  10 mEq Intravenous Once  . potassium chloride  10 mEq Intravenous Q1 Hr x 6  . potassium chloride  40 mEq Oral Q3H  . simvastatin  40 mg Oral QHS  . sodium chloride  1,000 mL Intravenous Once  . Vitamin D (Ergocalciferol)  50,000 Units Oral Q7 days   Continuous Infusions:   . sodium chloride    . DISCONTD: sodium chloride 100 mL/hr at 11/02/11 0609   PRN Meds:.acetaminophen, acetaminophen, ondansetron (ZOFRAN) IV, ondansetron, oxyCODONE, senna-docusate, zolpidem  Assessment/Plan:  Principal Problem:  *Hypokalemia, will plan on aggressively replacing potassium, magnesium is acceptable.  Once potassium in normal range can likely discharge home. Active Problems:  Weakness generalized  Serum lipase elevation  Nausea & vomiting  Diarrhea  Endometritis  DM (diabetes mellitus)  HTN (hypertension)  Hyperlipidemia  Obesity    LOS: 1 day   Basir Niven [facility]  11/02/2011, 7:48 PM

## 2011-11-03 LAB — BASIC METABOLIC PANEL
BUN: 4 mg/dL — ABNORMAL LOW (ref 6–23)
Calcium: 7.9 mg/dL — ABNORMAL LOW (ref 8.4–10.5)
Chloride: 106 mEq/L (ref 96–112)
Creatinine, Ser: 0.82 mg/dL (ref 0.50–1.10)
GFR calc Af Amer: 86 mL/min — ABNORMAL LOW (ref >90)
GFR calc non Af Amer: 74 mL/min — ABNORMAL LOW (ref >90)

## 2011-11-03 LAB — CULTURE, BLOOD (ROUTINE X 2)
Culture  Setup Time: 201212161918
Culture: NO GROWTH

## 2011-11-03 LAB — POTASSIUM: Potassium: 3.8 mEq/L (ref 3.5–5.1)

## 2011-11-03 LAB — GLUCOSE, CAPILLARY: Glucose-Capillary: 114 mg/dL — ABNORMAL HIGH (ref 70–99)

## 2011-11-03 MED ORDER — LOPERAMIDE HCL 2 MG PO CAPS
2.0000 mg | ORAL_CAPSULE | Freq: Once | ORAL | Status: AC
  Administered 2011-11-03: 2 mg via ORAL
  Filled 2011-11-03: qty 1

## 2011-11-03 MED ORDER — LOSARTAN POTASSIUM 100 MG PO TABS: 100.0000 mg | ORAL_TABLET | Freq: Every day | ORAL | Status: DC

## 2011-11-03 MED ORDER — LOPERAMIDE HCL 2 MG PO CAPS: 2.0000 mg | ORAL_CAPSULE | Freq: Four times a day (QID) | ORAL | Status: AC | PRN

## 2011-11-03 NOTE — Progress Notes (Signed)
Gave pt discharge instructions and prescriptions. Pt verbalized understanding of instructions given. Pt left via wheelchair with tech in no acute distress.

## 2011-11-03 NOTE — Progress Notes (Signed)
Physician Discharge Summary  Patient ID: Yazleemar Strassner MRN: [id number] DOB/AGE: Jun 06, 1946 65 y.o.  Admit date: 11/01/2011 Discharge date: 11/03/2011  Primary Care Physician:  No primary provider on file.   Discharge Diagnoses:    Principal Problem:  *Hypokalemia Active Problems:  Weakness generalized  Serum lipase elevation  Nausea & vomiting  Diarrhea  Endometritis  DM (diabetes mellitus)  HTN (hypertension)  Hyperlipidemia  Obesity    Current Discharge Medication List    START taking these medications   Details  loperamide (IMODIUM) 2 MG capsule Take 1 capsule (2 mg total) by mouth 4 (four) times daily as needed for diarrhea or loose stools. Qty: 30 capsule, Refills: 0    losartan (COZAAR) 100 MG tablet Take 1 tablet (100 mg total) by mouth daily. Qty: 30 tablet, Refills: 0      CONTINUE these medications which have NOT CHANGED   Details  aspirin 81 MG chewable tablet Chew 81 mg by mouth daily.      cefUROXime (CEFTIN) 500 MG tablet Take 500 mg by mouth every 6 (six) hours.      esomeprazole (NEXIUM) 40 MG capsule Take 40 mg by mouth daily before breakfast.      HYDROcodone-acetaminophen (VICODIN) 5-500 MG per tablet Take 1 tablet by mouth every 6 (six) hours as needed. Takes for pain     metroNIDAZOLE (FLAGYL) 500 MG tablet Take 500 mg by mouth 2 (two) times daily.      simvastatin (ZOCOR) 40 MG tablet Take 40 mg by mouth at bedtime.      Vitamin D, Ergocalciferol, (DRISDOL) 50000 UNITS CAPS Take 50,000 Units by mouth every 7 (seven) days.      zolpidem (AMBIEN) 10 MG tablet Take 10 mg by mouth at bedtime as needed. For sleep       STOP taking these medications     losartan-hydrochlorothiazide (HYZAAR) 100-25 MG per tablet      metFORMIN (GLUCOPHAGE-XR) 500 MG 24 hr tablet      potassium chloride SA (K-DUR,KLOR-CON) 20 MEQ tablet        Discharge Exam: Blood pressure 133/79, pulse 68, temperature 98.3 F (36.8 C), temperature source Oral,  resp. rate 20, height 5\' 3"  (1.6 m), weight 95.6 kg (210 lb 12.2 oz), SpO2 96.00%. NAD CTA B S1,  S2 RRR Soft, NT, ND, BS+ No edema b/l  Disposition and Follow-up:  Follow up with primary doctor in 1-2 weeks  Consults:  none   Significant Diagnostic Studies:  No results found.  Brief H and P: For complete details please refer to admission H and P, but in brief Patient is a 65 year old obese black woman with a history of hypertension, hyperlipidemia, diabetes, who was just recently discharged from [facility] hospital yesterday with a diagnosis of endometritis. It appears that on December 10 she had a hysteroscopy with D&C for vaginal bleeding. And required readmission to the hospital on December 16 for fever and abdominal pain and at that point was diagnosed with presumptive endometritis placed on IV antibiotics and subsequently discharged home yesterday on Flagyl and Ceftin. Going back in her history to about 6 weeks ago she was newly diagnosed with diabetes and started on metformin. Ever since starting the metformin she has been having difficulty eating secondary to nausea. She states that she does not have pain with swallowing or difficulty swallowing it's more that everything she eats causes her to be nauseous. She has actually scheduled an appointment with a gastroenterologist in [place] in early January because  of these issues. In the emergency department she is found to have a potassium of 2.3 and we are asked to admit her for further evaluation and management.   Hospital Course:  Patient was admitted to the hospital for severe hypokalemia.  She was monitored on telemetry.  Her potassium was aggressively replaced via the IV and PO route. Magnesium was also normal.  Today her potassium is in normal range, and she feels significantly better.  The etiology of her hypokalemia may have been from poor po intake (she says she has not been able to eat in weeks) as well as concomitant HCTZ use.   Her HCTZ has been discontinued, but she will be continued on losartan.  Patient was also on metformin and described significant GI upset. This may have been the reason why she couldn't eat.  Metformin was held in the hospital, and her appetite has significantly improved.  She is tolerating a regular diet without difficulty.  She did have a mild elevation of her lipase, but had no clinical indication of acute pancreatitis. She has a GI doctor in [facility] that she can follow up with, if she has any recurrence of her symptoms. Her blood sugars are ranging between 90-120, even though she is not receiving metformin.  She was advised to watch her calorie intake and follow up with her primary doctor for further recommendations in managing her diabetes.  Patient was continued on her outpatient antibiotics which were recently prescribed for endometritis.  She is doing well today, and is stable for discharge home.   Time spent on Discharge:  Signed: Shivank Pinedo [facility] 11/03/2011, 2:07 PM

## 2011-11-05 LAB — GLUCOSE, CAPILLARY

## 2011-11-10 NOTE — Discharge Summary (Signed)
Patient ID:  Miranda Francis  MRN: [id number]  DOB/AGE: 03-13-1946 65 y.o.   Admit date: 11/01/2011   Discharge date: 11/03/2011   Primary Care Physician: No primary provider on file.   Discharge Diagnoses:  Principal Problem:  *Hypokalemia  Active Problems:  Weakness generalized  Serum lipase elevation  Nausea & vomiting  Diarrhea  Endometritis  DM (diabetes mellitus)  HTN (hypertension)  Hyperlipidemia  Obesity   Current Discharge Medication List     START taking these medications    Details   loperamide (IMODIUM) 2 MG capsule  Take 1 capsule (2 mg total) by mouth 4 (four) times daily as needed for diarrhea or loose stools.  Qty: 30 capsule, Refills: 0     losartan (COZAAR) 100 MG tablet  Take 1 tablet (100 mg total) by mouth daily.  Qty: 30 tablet, Refills: 0       CONTINUE these medications which have NOT CHANGED    Details   aspirin 81 MG chewable tablet  Chew 81 mg by mouth daily.     cefUROXime (CEFTIN) 500 MG tablet  Take 500 mg by mouth every 6 (six) hours.     esomeprazole (NEXIUM) 40 MG capsule  Take 40 mg by mouth daily before breakfast.     HYDROcodone-acetaminophen (VICODIN) 5-500 MG per tablet  Take 1 tablet by mouth every 6 (six) hours as needed. Takes for pain     metroNIDAZOLE (FLAGYL) 500 MG tablet  Take 500 mg by mouth 2 (two) times daily.     simvastatin (ZOCOR) 40 MG tablet  Take 40 mg by mouth at bedtime.     Vitamin D, Ergocalciferol, (DRISDOL) 50000 UNITS CAPS  Take 50,000 Units by mouth every 7 (seven) days.     zolpidem (AMBIEN) 10 MG tablet  Take 10 mg by mouth at bedtime as needed. For sleep       STOP taking these medications      losartan-hydrochlorothiazide (HYZAAR) 100-25 MG per tablet       metFORMIN (GLUCOPHAGE-XR) 500 MG 24 hr tablet       potassium chloride SA (K-DUR,KLOR-CON) 20 MEQ tablet         Discharge Exam:  Blood pressure 133/79, pulse 68, temperature 98.3 F (36.8 C), temperature source Oral, resp. rate  20, height 5\' 3"  (1.6 m), weight 95.6 kg (210 lb 12.2 oz), SpO2 96.00%.  NAD  CTA B  S1, S2 RRR  Soft, NT, ND, BS+  No edema b/l   Disposition and Follow-up:  Follow up with primary doctor in 1-2 weeks   Consults: none   Significant Diagnostic Studies:  No results found.   Brief H and P:  For complete details please refer to admission H and P, but in brief Patient is a 65 year old obese black woman with a history of hypertension, hyperlipidemia, diabetes, who was just recently discharged from [facility] hospital yesterday with a diagnosis of endometritis. It appears that on December 10 she had a hysteroscopy with D&C for vaginal bleeding. And required readmission to the hospital on December 16 for fever and abdominal pain and at that point was diagnosed with presumptive endometritis placed on IV antibiotics and subsequently discharged home yesterday on Flagyl and Ceftin. Going back in her history to about 6 weeks ago she was newly diagnosed with diabetes and started on metformin. Ever since starting the metformin she has been having difficulty eating secondary to nausea. She states that she does not have pain with swallowing or difficulty swallowing it's more  that everything she eats causes her to be nauseous. She has actually scheduled an appointment with a gastroenterologist in [place] in early January because of these issues. In the emergency department she is found to have a potassium of 2.3 and we are asked to admit her for further evaluation and management.   Hospital Course:  Patient was admitted to the hospital for severe hypokalemia. She was monitored on telemetry. Her potassium was aggressively replaced via the IV and PO route. Magnesium was also normal. Today her potassium is in normal range, and she feels significantly better. The etiology of her hypokalemia may have been from poor po intake (she says she has not been able to eat in weeks) as well as concomitant HCTZ use. Her HCTZ has  been discontinued, but she will be continued on losartan.  Patient was also on metformin and described significant GI upset. This may have been the reason why she couldn't eat. Metformin was held in the hospital, and her appetite has significantly improved. She is tolerating a regular diet without difficulty. She did have a mild elevation of her lipase, but had no clinical indication of acute pancreatitis. She has a GI doctor in [facility] that she can follow up with, if she has any recurrence of her symptoms. Her blood sugars are ranging between 90-120, even though she is not receiving metformin. She was advised to watch her calorie intake and follow up with her primary doctor for further recommendations in managing her diabetes.  Patient was continued on her outpatient antibiotics which were recently prescribed for endometritis.  She is doing well today, and is stable for discharge home.   Time spent on Discharge:  

## 2013-01-31 IMAGING — US US PELVIS COMPLETE
1 series · 14 of 25 positions shown · non-contrast
Comparison: None.

CLINICAL DATA: Pelvic pain.

TRANSABDOMINAL AND TRANSVAGINAL ULTRASOUND OF PELVIS
TECHNIQUE: Both transabdominal and transvaginal ultrasound
examinations of the pelvis were performed. Transabdominal technique
was performed for global imaging of the pelvis including uterus,
ovaries, adnexal regions, and pelvic cul-de-sac.

[Series 1: us pelvis complete · 14 of 55 slices shown]
[im 1/55]
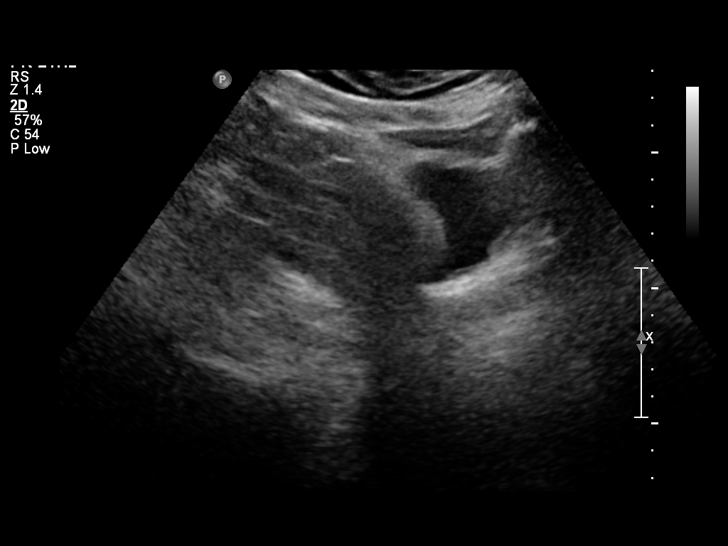
[im 5/55]
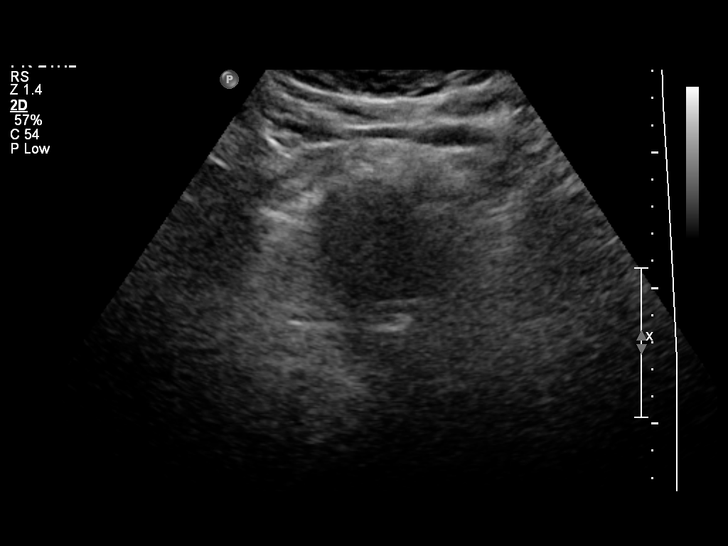
[im 10/55]
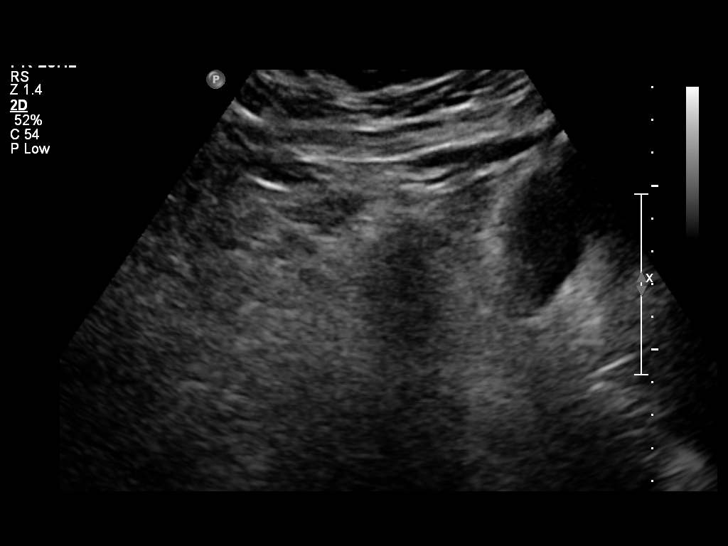
[im 14/55]
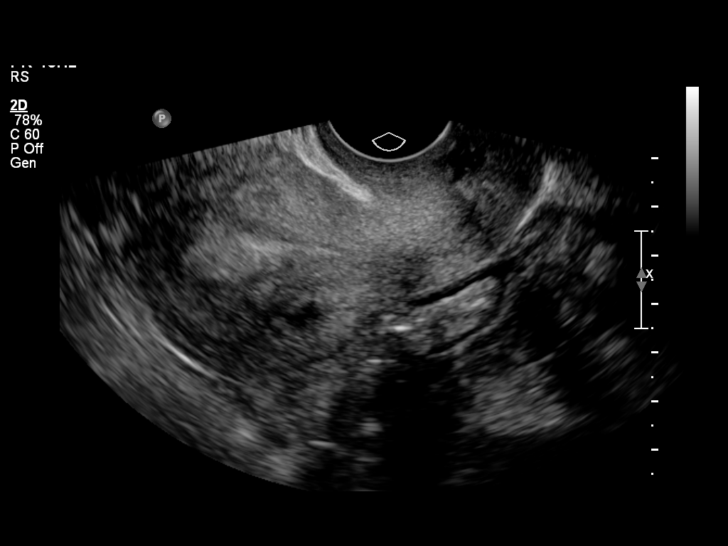
[im 19/55]
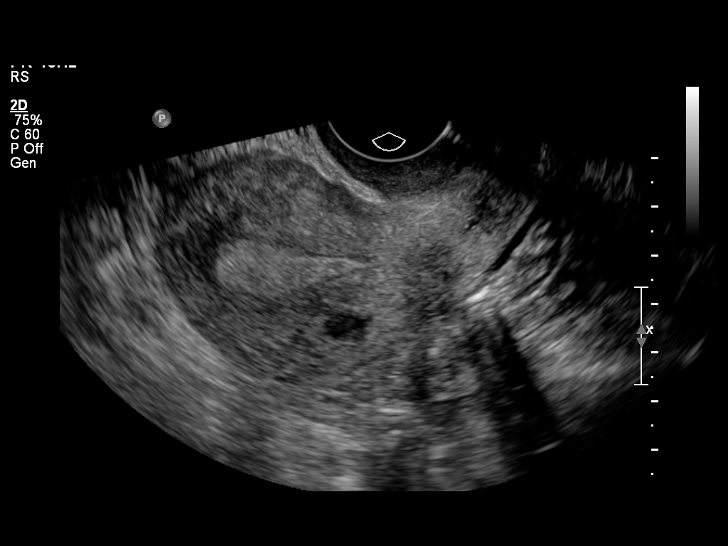
[im 21/55]
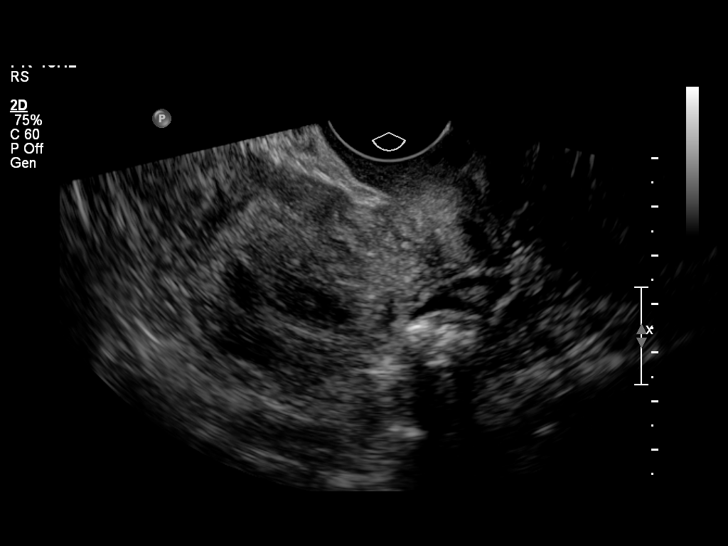
[im 25/55]
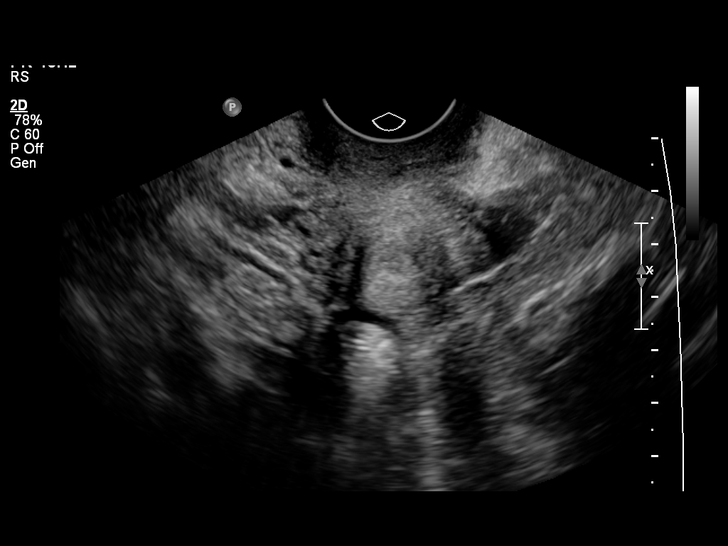
[im 30/55]
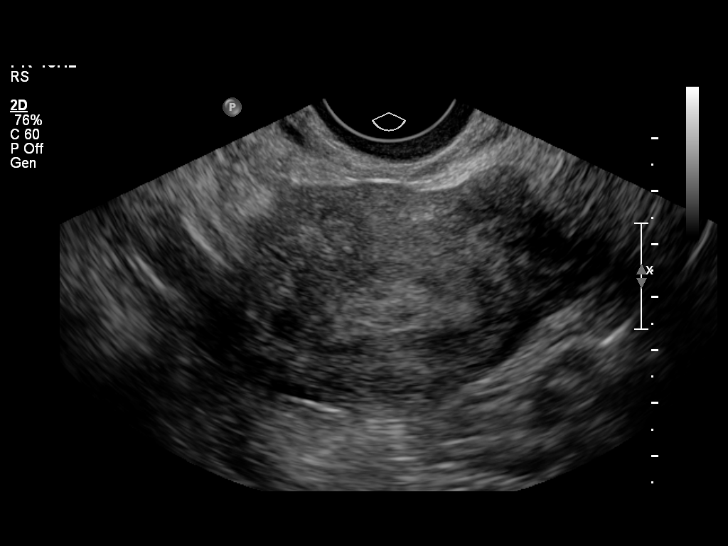
[im 34/55]
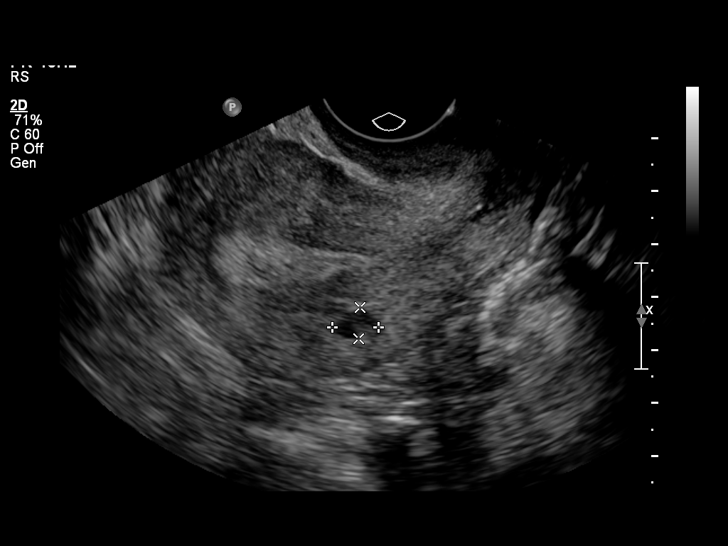
[im 37/55]
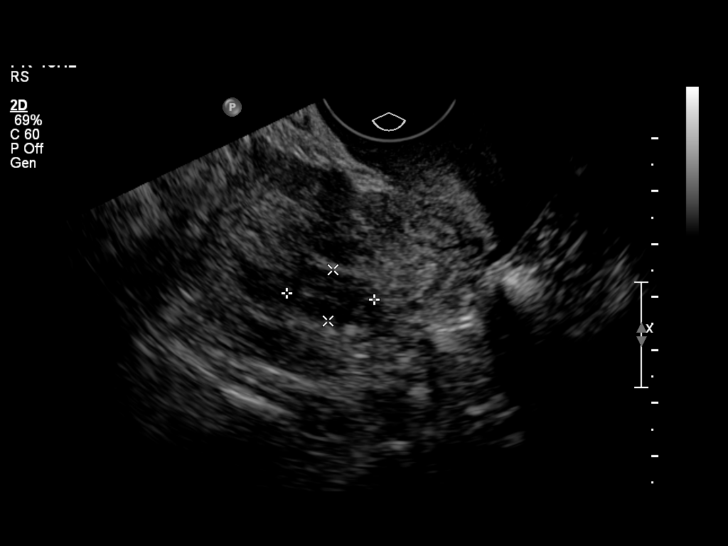
[im 41/55]
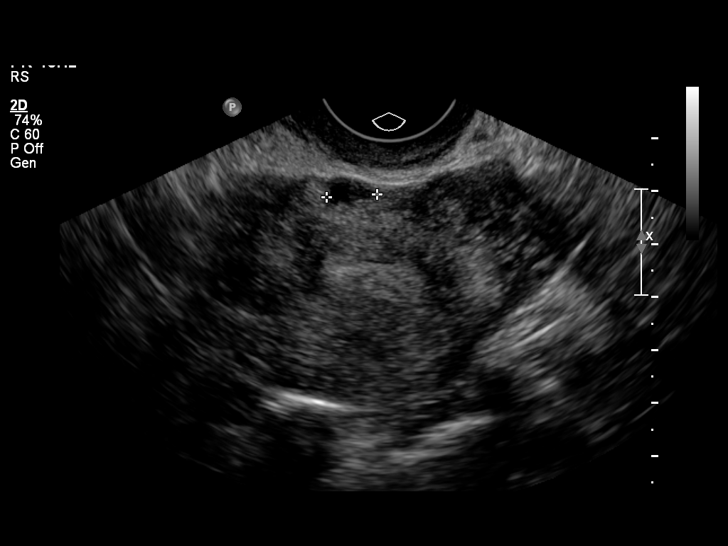
[im 46/55]
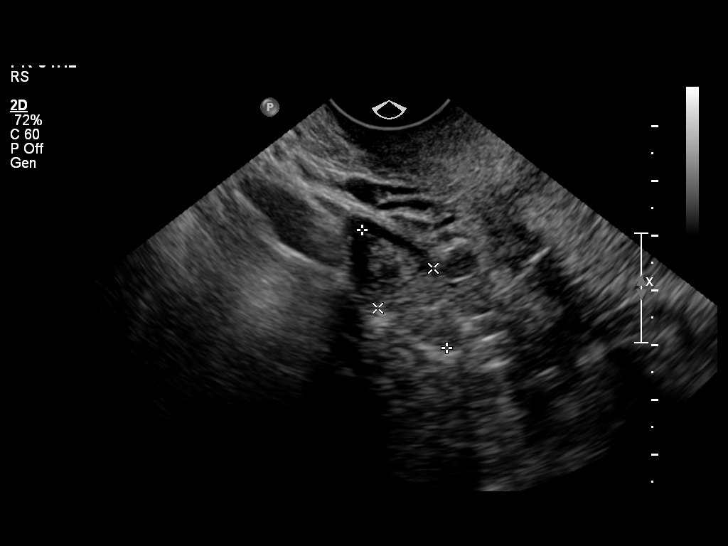
[im 50/55]
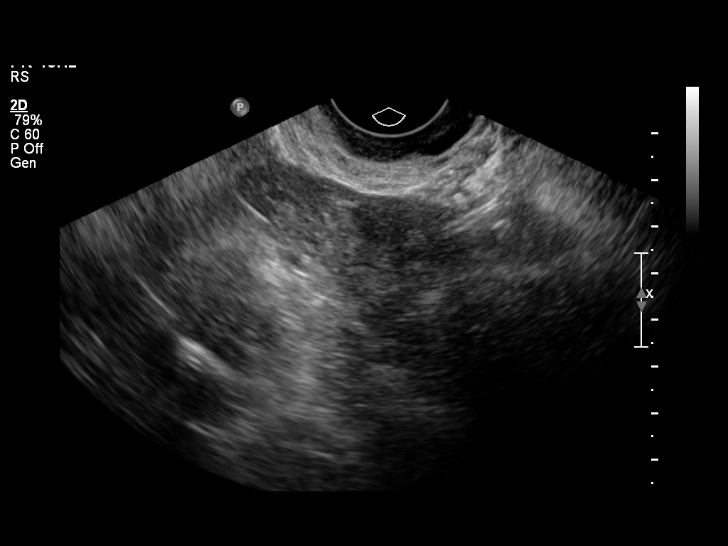
[im 55/55]
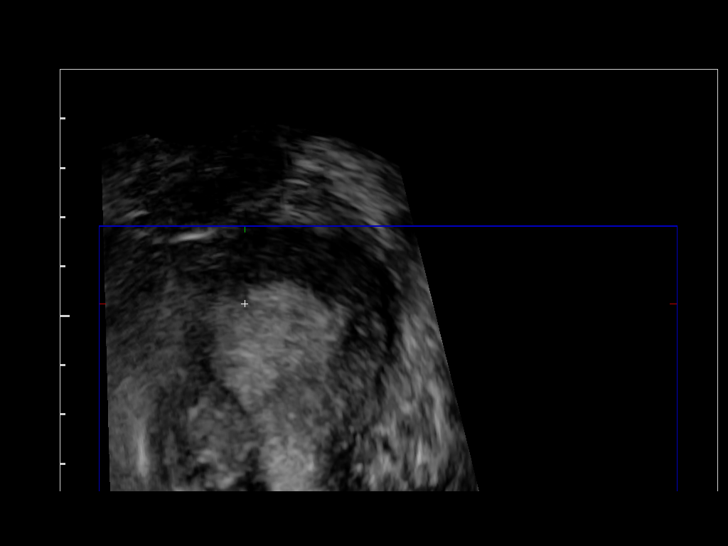

[14 of 25 positions shown; findings below may reference images not displayed]

It was necessary to proceed with endovaginal exam following the
transabdominal exam to visualize the endometrium and adnexa.
FINDINGS: Uterus: Measures 8.6 x 4.8 x 5.2 cm.  There are multiple small
intramural fibroids, the largest of which measures 1.7 x 1.0 cm.

Endometrium: Normal in appearance and thickness, measuring 8 mm. No
increased vascularity.

Right ovary:  Measures 2.7 x 1.3 x 1.8 cm.  Normal sonographic
appearance.

Left ovary: Not well visualized.

Other findings: No free fluid
IMPRESSION: Multiple intramural fibroids.

Endometrial appearance is within normal limits.

Normal right ovary.  Left ovary not visualized.

## 2015-09-28 DIAGNOSIS — I10 Essential (primary) hypertension: Secondary | ICD-10-CM | POA: Insufficient documentation

## 2018-10-31 ENCOUNTER — Emergency Department (HOSPITAL_BASED_OUTPATIENT_CLINIC_OR_DEPARTMENT_OTHER)
Admission: EM | Admit: 2018-10-31 | Discharge: 2018-10-31 | Disposition: A | Discharge status: Home / Self Care | Payer: 59 | Attending: Emergency Medicine | Admitting: Emergency Medicine

## 2018-10-31 ENCOUNTER — Encounter (HOSPITAL_BASED_OUTPATIENT_CLINIC_OR_DEPARTMENT_OTHER): Payer: Self-pay | Admitting: Emergency Medicine

## 2018-10-31 ENCOUNTER — Other Ambulatory Visit: Payer: Self-pay

## 2018-10-31 ENCOUNTER — Emergency Department (HOSPITAL_BASED_OUTPATIENT_CLINIC_OR_DEPARTMENT_OTHER): Payer: 59

## 2018-10-31 DIAGNOSIS — M545 Low back pain: Secondary | ICD-10-CM | POA: Insufficient documentation

## 2018-10-31 DIAGNOSIS — Z79899 Other long term (current) drug therapy: Secondary | ICD-10-CM | POA: Diagnosis not present

## 2018-10-31 DIAGNOSIS — I10 Essential (primary) hypertension: Secondary | ICD-10-CM | POA: Insufficient documentation

## 2018-10-31 DIAGNOSIS — R109 Unspecified abdominal pain: Secondary | ICD-10-CM

## 2018-10-31 DIAGNOSIS — E119 Type 2 diabetes mellitus without complications: Secondary | ICD-10-CM | POA: Diagnosis not present

## 2018-10-31 DIAGNOSIS — R1031 Right lower quadrant pain: Secondary | ICD-10-CM | POA: Diagnosis not present

## 2018-10-31 DIAGNOSIS — Z7982 Long term (current) use of aspirin: Secondary | ICD-10-CM | POA: Insufficient documentation

## 2018-10-31 DIAGNOSIS — R8271 Bacteriuria: Secondary | ICD-10-CM

## 2018-10-31 LAB — CBC WITH DIFFERENTIAL/PLATELET
ABS IMMATURE GRANULOCYTES: 0.04 10*3/uL (ref 0.00–0.07)
BASOS ABS: 0 10*3/uL (ref 0.0–0.1)
Basophils Relative: 0 %
Eosinophils Absolute: 0 10*3/uL (ref 0.0–0.5)
Eosinophils Relative: 0 %
HEMATOCRIT: 41.3 % (ref 36.0–46.0)
HEMOGLOBIN: 14 g/dL (ref 12.0–15.0)
Immature Granulocytes: 1 %
LYMPHS ABS: 1.9 10*3/uL (ref 0.7–4.0)
LYMPHS PCT: 24 %
MCH: 30.6 pg (ref 26.0–34.0)
MCHC: 33.9 g/dL (ref 30.0–36.0)
MCV: 90.4 fL (ref 80.0–100.0)
MONO ABS: 0.2 10*3/uL (ref 0.1–1.0)
Monocytes Relative: 3 %
NEUTROS ABS: 5.5 10*3/uL (ref 1.7–7.7)
Neutrophils Relative %: 72 %
Platelets: 174 10*3/uL (ref 150–400)
RBC: 4.57 MIL/uL (ref 3.87–5.11)
RDW: 13.8 % (ref 11.5–15.5)
WBC: 7.6 10*3/uL (ref 4.0–10.5)
nRBC: 0 % (ref 0.0–0.2)

## 2018-10-31 LAB — COMPREHENSIVE METABOLIC PANEL
ALBUMIN: 4.4 g/dL (ref 3.5–5.0)
ALT: 19 U/L (ref 0–44)
AST: 18 U/L (ref 15–41)
Alkaline Phosphatase: 62 U/L (ref 38–126)
Anion gap: 10 (ref 5–15)
BUN: 13 mg/dL (ref 8–23)
CO2: 23 mmol/L (ref 22–32)
CREATININE: 0.64 mg/dL (ref 0.44–1.00)
Calcium: 9.3 mg/dL (ref 8.9–10.3)
Chloride: 102 mmol/L (ref 98–111)
GFR calc Af Amer: >60 mL/min (ref >60)
GFR calc non Af Amer: >60 mL/min (ref >60)
GLUCOSE: 170 mg/dL — AB (ref 70–99)
Potassium: 3.1 mmol/L — ABNORMAL LOW (ref 3.5–5.1)
SODIUM: 135 mmol/L (ref 135–145)
Total Bilirubin: 0.7 mg/dL (ref 0.3–1.2)
Total Protein: 7.7 g/dL (ref 6.5–8.1)

## 2018-10-31 LAB — URINALYSIS, ROUTINE W REFLEX MICROSCOPIC
BILIRUBIN URINE: NEGATIVE
GLUCOSE, UA: 250 mg/dL — AB
Leukocytes, UA: NEGATIVE
Nitrite: NEGATIVE
Protein, ur: 30 mg/dL — AB
Specific Gravity, Urine: 1.02 (ref 1.005–1.030)
pH: 7 (ref 5.0–8.0)

## 2018-10-31 LAB — URINALYSIS, MICROSCOPIC (REFLEX)

## 2018-10-31 LAB — LIPASE, BLOOD: Lipase: 24 U/L (ref 11–51)

## 2018-10-31 MED ORDER — SODIUM CHLORIDE 0.9 % IV SOLN
1.0000 g | Freq: Once | INTRAVENOUS | Status: AC
  Administered 2018-10-31: 1 g via INTRAVENOUS
  Filled 2018-10-31: qty 10

## 2018-10-31 MED ORDER — SODIUM CHLORIDE 0.9 % IV BOLUS
500.0000 mL | Freq: Once | INTRAVENOUS | Status: AC
  Administered 2018-10-31: 500 mL via INTRAVENOUS

## 2018-10-31 MED ORDER — FENTANYL CITRATE (PF) 100 MCG/2ML IJ SOLN
50.0000 ug | Freq: Once | INTRAMUSCULAR | Status: AC
  Administered 2018-10-31: 50 ug via INTRAVENOUS
  Filled 2018-10-31: qty 2

## 2018-10-31 MED ORDER — ONDANSETRON 4 MG PO TBDP: 4.0000 mg | ORAL_TABLET | Freq: Three times a day (TID) | ORAL | 0 refills | Status: DC | PRN

## 2018-10-31 MED ORDER — ONDANSETRON HCL 4 MG/2ML IJ SOLN
4.0000 mg | Freq: Once | INTRAMUSCULAR | Status: AC
  Administered 2018-10-31: 4 mg via INTRAVENOUS
  Filled 2018-10-31: qty 2

## 2018-10-31 MED ORDER — KETOROLAC TROMETHAMINE 15 MG/ML IJ SOLN
15.0000 mg | Freq: Once | INTRAMUSCULAR | Status: AC
  Administered 2018-10-31: 15 mg via INTRAVENOUS
  Filled 2018-10-31: qty 1

## 2018-10-31 MED ORDER — SODIUM CHLORIDE 0.9 % IV SOLN
INTRAVENOUS | Status: DC | PRN
  Administered 2018-10-31: 250 mL via INTRAVENOUS

## 2018-10-31 MED ORDER — CEPHALEXIN 500 MG PO CAPS: 500.0000 mg | ORAL_CAPSULE | Freq: Two times a day (BID) | ORAL | 0 refills | Status: DC

## 2018-10-31 MED ORDER — HYDROCODONE-ACETAMINOPHEN 5-325 MG PO TABS: 1.0000 | ORAL_TABLET | Freq: Four times a day (QID) | ORAL | 0 refills | Status: DC | PRN

## 2018-10-31 MED FILL — CEPHALEXIN 500 MG CAPSULE: 500 | 7 days supply | Qty: 14 | Fill #0

## 2018-10-31 MED FILL — HYDROCODON-APAP 5-325: 5-325 | 3 days supply | Qty: 10 | Fill #0

## 2018-10-31 MED FILL — ONDANSETRON ODT 4 MG TABLET: 4 | 4 days supply | Qty: 10 | Fill #0

## 2018-10-31 NOTE — ED Triage Notes (Signed)
Pt c/o pain across lower back with radiation to RLE and RT groin; she was seen for same on Sunday, but feels it was exacerbated after moving some trash; reports vomiting x 4 since 4am and generally doesn't feel well

## 2018-10-31 NOTE — ED Notes (Signed)
Pt requested to complete med rec when she is not in so much pain

## 2018-10-31 NOTE — Discharge Instructions (Signed)
Your CT scan today showed an abnormality on your pancreas. This will need to be followed up by your family doctor with further imaging. Get rechecked immediately if you have any new or concerning symptoms.

## 2018-10-31 NOTE — ED Provider Notes (Signed)
[facility]PART[facility] Provider Note   CSN: [id number] Arrival date & time: 10/31/18  0908     History   Chief Complaint Chief Complaint  Patient presents with  . Back Pain    HPI Anah Eng is a 72 y.o. female.  The history is provided by the patient. No language interpreter was used.  Back Pain     Palmyra Koble is a 72 y.o. female who presents to the Emergency Department complaining of back pain. Presents to the emergency department complaining of severe low back pain. She has chronic low back pain and noticed a change in her pain about one month ago. She is unsure if she injected or not. The pain is located in the low back on the right side. It radiates around to her right groin and down her right leg. She saw her doctor a few days ago and received a shot for inflammation and a shot for pain. Initially her pain felt improved but today she developed significant worsening of the pain with associated nausea and vomiting. She denies any fevers, abdominal pain, dysuria, hematuria, diarrhea, numbness, weakness. Her pain is described as aching in nature and constant. There is no change with positioning or activity. Past Medical History:  Diagnosis Date  . Blood transfusion   . Diabetes mellitus   . Heart murmur   . Hypertension     Patient Active Problem List   Diagnosis Date Noted  . Hypokalemia 11/01/2011  . Weakness generalized 11/01/2011  . Serum lipase elevation 11/01/2011  . Nausea & vomiting 11/01/2011  . Diarrhea 11/01/2011  . Endometritis 11/01/2011  . DM (diabetes mellitus) ([place]) 11/01/2011  . HTN (hypertension) 11/01/2011  . Hyperlipidemia 11/01/2011  . Obesity 11/01/2011    Past Surgical History:  Procedure Laterality Date  . DILATION AND CURETTAGE OF UTERUS    . NO PAST SURGERIES       OB History   No obstetric history on file.      Home Medications    Prior to Admission medications   Medication Sig Start Date End Date  Taking? Authorizing Provider  aspirin 81 MG chewable tablet Chew 81 mg by mouth daily.      [provider]  cefUROXime (CEFTIN) 500 MG tablet Take 500 mg by mouth every 6 (six) hours.   11/01/11   [provider]  cephALEXin (KEFLEX) 500 MG capsule Take 1 capsule (500 mg total) by mouth 2 (two) times daily. 10/31/18   Quintella Reichert, MD  esomeprazole (NEXIUM) 40 MG capsule Take 40 mg by mouth daily before breakfast.      [provider]  HYDROcodone-acetaminophen (NORCO/VICODIN) 5-325 MG tablet Take 1 tablet by mouth every 6 (six) hours as needed. 10/31/18   Quintella Reichert, MD  losartan (COZAAR) 100 MG tablet Take 1 tablet (100 mg total) by mouth daily. 11/03/11 11/02/12  Kathie Dike, MD  metroNIDAZOLE (FLAGYL) 500 MG tablet Take 500 mg by mouth 2 (two) times daily.   11/01/11   [provider]  ondansetron (ZOFRAN ODT) 4 MG disintegrating tablet Take 1 tablet (4 mg total) by mouth every 8 (eight) hours as needed for nausea or vomiting. 10/31/18   Quintella Reichert, MD  simvastatin (ZOCOR) 40 MG tablet Take 40 mg by mouth at bedtime.      [provider]  Vitamin D, Ergocalciferol, (DRISDOL) 50000 UNITS CAPS Take 50,000 Units by mouth every 7 (seven) days.      [provider]  zolpidem Lorrin Mais)  10 MG tablet Take 10 mg by mouth at bedtime as needed. For sleep     [provider]    Family History Family History  Problem Relation Age of Onset  . Diabetes type II Mother   . Diabetes type II Sister     Social History Social History   Tobacco Use  . Smoking status: Never Smoker  . Smokeless tobacco: Never Used  Substance Use Topics  . Alcohol use: No  . Drug use: No     Allergies   Patient has no known allergies.   Review of Systems Review of Systems  Musculoskeletal: Positive for back pain.  All other systems reviewed and are negative.    Physical Exam Updated Vital Signs BP (!) 146/62 (BP Location: Left  Arm)   Pulse 64   Temp (!) 97.5 F (36.4 C) (Oral)   Resp 20   Ht 5\' 3"  (1.6 m)   Wt 95.3 kg   SpO2 98%   BMI 37.20 kg/m   Physical Exam Vitals signs and nursing note reviewed.  Constitutional:      Appearance: She is well-developed.  HENT:     Head: Normocephalic and atraumatic.  Cardiovascular:     Rate and Rhythm: Normal rate and regular rhythm.     Heart sounds: No murmur.  Pulmonary:     Effort: Pulmonary effort is normal. No respiratory distress.     Breath sounds: Normal breath sounds.  Abdominal:     Palpations: Abdomen is soft.     Tenderness: There is no abdominal tenderness. There is no guarding or rebound.     Comments: No CVA tenderness  Musculoskeletal:        General: No tenderness.     Comments: 2+ DP pulses bilaterally. No edema to lower extremities.  Skin:    General: Skin is warm and dry.  Neurological:     Mental Status: She is alert and oriented to person, place, and time.     Comments: Five out of five strength in all four extremities with sensation to light touch intact in all four extremities  Psychiatric:        Behavior: Behavior normal.      ED Treatments / Results  Labs (all labs ordered are listed, but only abnormal results are displayed) Labs Reviewed  COMPREHENSIVE METABOLIC PANEL - Abnormal; Notable for the following components:      Result Value   Potassium 3.1 (*)    Glucose, Bld 170 (*)    All other components within normal limits  URINALYSIS, ROUTINE W REFLEX MICROSCOPIC - Abnormal; Notable for the following components:   APPearance CLOUDY (*)    Glucose, UA 250 (*)    Hgb urine dipstick TRACE (*)    Ketones, ur >80 (*)    Protein, ur 30 (*)    All other components within normal limits  URINALYSIS, MICROSCOPIC (REFLEX) - Abnormal; Notable for the following components:   Bacteria, UA MANY (*)    All other components within normal limits  URINE CULTURE  CBC WITH DIFFERENTIAL/PLATELET  LIPASE, BLOOD     EKG None  Radiology Ct Renal Stone Study  Result Date: 10/31/2018 CLINICAL DATA:  Flank region pain, primarily on the right. EXAM: CT ABDOMEN AND PELVIS WITHOUT CONTRAST TECHNIQUE: Multidetector CT imaging of the abdomen and pelvis was performed following the standard protocol without oral or IV contrast. COMPARISON:  October 29, 2011 FINDINGS: Lower chest: There is atelectatic change in the left base. There is  a small calcified granuloma in the posterior aspect of the superior segment right lower lobe. There is no lung base edema or consolidation. There are foci of coronary artery calcification. Hepatobiliary: No focal liver lesions are appreciable on this noncontrast enhanced study. Gallbladder wall is not appreciably thickened. There is no biliary duct dilatation. Pancreas: There is atrophy of the body and tail of the pancreas with diffuse duct dilatation in these areas. There is relative prominence at the junction of the head and body of the pancreas, not present previously. A well-defined mass is not seen in this area on noncontrast enhanced CT, however. Spleen: No splenic lesions are evident. Adrenals/Urinary Tract: Adrenals bilaterally appear unremarkable. There is a 6 mm probable hyperdense cyst in the lateral mid to lower region of the right kidney. There is no evident hydronephrosis on either side. There is no appreciable renal or ureteral calculus on either side. Urinary bladder is midline with wall thickness within normal limits. Stomach/Bowel: There are occasional sigmoid diverticula without diverticulitis. There is no appreciable bowel wall or mesenteric thickening. There is no appreciable bowel obstruction. There is no free air or portal venous air. Vascular/Lymphatic: There are foci of aortic atherosclerosis. Aorta is tortuous without aneurysm. Major mesenteric arterial vessels appear patent. There is no adenopathy in the abdomen or pelvis. Reproductive: Uterus is anteverted. There is  no appreciable pelvic mass. Other: The appendix appears normal. There is no abscess or ascites in the abdomen or pelvis. There is a focal ventral hernia containing only fat. There is fat in each inguinal ring. Musculoskeletal: There is degenerative change in the lower thoracic and lumbar regions. There is moderate spinal stenosis at L4-5 due to disc protrusion and bony hypertrophy. There are no blastic or lytic bone lesions. There is no intramuscular lesion evident. IMPRESSION: 1. Relative atrophy of the body and tail the pancreas with pancreatic duct dilatation. There is no duct dilatation in the head or uncinate process of the pancreas. There is subtle fullness in the head of the pancreas region without well-defined mass on noncontrast enhanced study. Given the possibility of a neoplastic focus in the head of the pancreas causing these changes, correlation with pre and post contrast MR or CT of the pancreas advised to further evaluate. MR would be the optimum study of choice to evaluate the pancreas further in this regard. 2. Subcentimeter probable hyperdense cyst lateral right kidney. No hydronephrosis on either side. No evident renal or ureteral calculus. 3. Occasional sigmoid diverticula without diverticulitis. No bowel obstruction. No abscess in the abdomen or pelvis. The appendix appears normal. 4. Ventral hernia containing only fat. Fat noted in each inguinal ring. 5.  Spinal stenosis at L4-5, moderate.  Multifactorial etiology. 6. Aortic atherosclerosis. Foci of coronary artery calcification noted. Aortic Atherosclerosis (ICD10-I70.0). Electronically Signed   By: Lowella Grip III M.D.   On: 10/31/2018 11:19    Procedures Procedures (including critical care time)  Medications Ordered in ED Medications  0.9 %  sodium chloride infusion ( Intravenous Stopped 10/31/18 1326)  fentaNYL (SUBLIMAZE) injection 50 mcg (50 mcg Intravenous Given 10/31/18 0950)  ondansetron (ZOFRAN) injection 4 mg (4 mg  Intravenous Given 10/31/18 0948)  cefTRIAXone (ROCEPHIN) 1 g in sodium chloride 0.9 % 100 mL IVPB ( Intravenous Stopped 10/31/18 1301)  sodium chloride 0.9 % bolus 500 mL (0 mLs Intravenous Stopped 10/31/18 1333)  ketorolac (TORADOL) 15 MG/ML injection 15 mg (15 mg Intravenous Given 10/31/18 1219)     Initial Impression / Assessment and Plan / ED  Course  I have reviewed the triage vital signs and the nursing notes.  Pertinent labs & imaging results that were available during my care of the patient were reviewed by me and considered in my medical decision making (see chart for details).     Patient here for evaluation of right flank pain that radiates to her right lower quadrant and. She is neurovascular early intact on examination. UA is concerning for UTI in setting of bacteria and flank pain - will treat with antibiotics. CT is negative for obstructing stone or inflammatory process. Presentation is not consistent with cauda equina, epidural abscess. Discussed with patient UTI as well as musculoskeletal pain. Discussed incidental findings of changes to the pancreatic head, this does not appear to be contributing to her back pain. Discussed importance of PCP follow-up for further assessment. Return precautions discussed.  Final Clinical Impressions(s) / ED Diagnoses   Final diagnoses:  Flank pain  Bacteria in urine    ED Discharge Orders         Ordered    cephALEXin (KEFLEX) 500 MG capsule  2 times daily     10/31/18 1216    HYDROcodone-acetaminophen (NORCO/VICODIN) 5-325 MG tablet  Every 6 hours PRN     10/31/18 1216    ondansetron (ZOFRAN ODT) 4 MG disintegrating tablet  Every 8 hours PRN     10/31/18 1216           Quintella Reichert, MD 10/31/18 1510

## 2018-11-03 LAB — URINE CULTURE

## 2018-11-03 MED FILL — OXYCODONE-ACETAMINOPHEN 5-3: 5-325 | 5 days supply | Qty: 15 | Fill #0

## 2018-11-04 ENCOUNTER — Telehealth: Payer: Self-pay

## 2018-11-04 NOTE — Telephone Encounter (Signed)
Post ED Visit - Positive Culture Follow-up  Culture report reviewed by antimicrobial stewardship pharmacist:  []  Elenor Quinones, Pharm.D. []  Heide Guile, Pharm.D., BCPS AQ-ID []  Parks Neptune, Pharm.D., BCPS []  Alycia Rossetti, Pharm.D., BCPS []  [place], Pharm.D., BCPS, AAHIVP []  Legrand Como, Pharm.D., BCPS, AAHIVP []  Salome Arnt, PharmD, BCPS []  Johnnette Gourd, PharmD, BCPS []  Hughes Better, PharmD, BCPS []  Leeroy Cha, PharmD C. [place]  Positive urine culture Treated with Cephalexin, organism sensitive to the same and no further patient follow-up is required at this time.  Genia Del 11/04/2018, 11:37 AM

## 2018-11-04 NOTE — Telephone Encounter (Signed)
Post ED Visit - Positive Culture Follow-up  Culture report reviewed by antimicrobial stewardship pharmacist:  []  Elenor Quinones, Pharm.D. []  Heide Guile, Pharm.D., BCPS AQ-ID []  Parks Neptune, Pharm.D., BCPS []  Alycia Rossetti, Pharm.D., BCPS []  [place], Pharm.D., BCPS, AAHIVP []  Legrand Como, Pharm.D., BCPS, AAHIVP []  Salome Arnt, PharmD, BCPS []  Johnnette Gourd, PharmD, BCPS []  Hughes Better, PharmD, BCPS []  Leeroy Cha, PharmD C Pierce Positive urine culture Treated with Cephalexin, organism sensitive to the same and no further patient follow-up is required at this time.  Genia Del 11/04/2018, 11:38 AM

## 2019-08-25 ENCOUNTER — Other Ambulatory Visit: Payer: Self-pay

## 2019-08-25 DIAGNOSIS — Z20822 Contact with and (suspected) exposure to covid-19: Secondary | ICD-10-CM

## 2019-08-27 LAB — NOVEL CORONAVIRUS, NAA: SARS-CoV-2, NAA: DETECTED — AB

## 2019-09-11 ENCOUNTER — Ambulatory Visit: Payer: Self-pay

## 2019-09-11 NOTE — Telephone Encounter (Signed)
Patient is confused by a blood test she had at her PCP office for covid.  She was positive 10/13 test per chart.  She has been in quarantine 14 day needs to know if she can come out.  She was told to call her PCP for the information and advice, NT unable to see in chart the exact bood work patient has had. She states they told her they see where she has been exposed to COVID-19.  Reason for Disposition . [1] Caller requesting NON-URGENT health information AND [2] PCP's office is the best resource  Answer Assessment - Initial Assessment Questions 1. REASON FOR CALL or QUESTION: "What is your reason for calling today?" or "How can I best help you?" or "What question do you have that I can help answer?"     Patient is confused by a blood test she had at her PCP office for covid.  She was positive 10/13 test per chart.  She has been in quarantine 14 day needs to know if she can come out.  She was told to call her PCP for the information and advice, NT unable to see in chart the exact Bood work patient had.  Protocols used: INFORMATION ONLY CALL - NO TRIAGE-A-AH

## 2021-12-13 DIAGNOSIS — C801 Malignant (primary) neoplasm, unspecified: Secondary | ICD-10-CM

## 2021-12-13 HISTORY — DX: Malignant (primary) neoplasm, unspecified: C80.1

## 2022-01-16 ENCOUNTER — Emergency Department (HOSPITAL_COMMUNITY): Payer: Medicare HMO

## 2022-01-16 ENCOUNTER — Other Ambulatory Visit: Payer: Self-pay

## 2022-01-16 ENCOUNTER — Inpatient Hospital Stay (HOSPITAL_COMMUNITY)
Admission: EM | Admit: 2022-01-16 | Discharge: 2022-01-25 | DRG: 435 | Disposition: A | Discharge status: Home / Self Care | Payer: Medicare HMO | Attending: Internal Medicine | Admitting: Internal Medicine

## 2022-01-16 ENCOUNTER — Other Ambulatory Visit: Payer: Self-pay | Admitting: Geriatric Medicine

## 2022-01-16 ENCOUNTER — Other Ambulatory Visit (HOSPITAL_COMMUNITY): Payer: Self-pay | Admitting: Geriatric Medicine

## 2022-01-16 DIAGNOSIS — E440 Moderate protein-calorie malnutrition: Secondary | ICD-10-CM | POA: Diagnosis present

## 2022-01-16 DIAGNOSIS — K831 Obstruction of bile duct: Secondary | ICD-10-CM | POA: Diagnosis present

## 2022-01-16 DIAGNOSIS — Z79899 Other long term (current) drug therapy: Secondary | ICD-10-CM

## 2022-01-16 DIAGNOSIS — C259 Malignant neoplasm of pancreas, unspecified: Principal | ICD-10-CM | POA: Diagnosis present

## 2022-01-16 DIAGNOSIS — Z800 Family history of malignant neoplasm of digestive organs: Secondary | ICD-10-CM

## 2022-01-16 DIAGNOSIS — K9189 Other postprocedural complications and disorders of digestive system: Secondary | ICD-10-CM

## 2022-01-16 DIAGNOSIS — D630 Anemia in neoplastic disease: Secondary | ICD-10-CM | POA: Diagnosis present

## 2022-01-16 DIAGNOSIS — K859 Acute pancreatitis without necrosis or infection, unspecified: Secondary | ICD-10-CM | POA: Diagnosis not present

## 2022-01-16 DIAGNOSIS — L299 Pruritus, unspecified: Secondary | ICD-10-CM | POA: Diagnosis not present

## 2022-01-16 DIAGNOSIS — E119 Type 2 diabetes mellitus without complications: Secondary | ICD-10-CM | POA: Diagnosis present

## 2022-01-16 DIAGNOSIS — Z833 Family history of diabetes mellitus: Secondary | ICD-10-CM

## 2022-01-16 DIAGNOSIS — E876 Hypokalemia: Secondary | ICD-10-CM | POA: Diagnosis not present

## 2022-01-16 DIAGNOSIS — R17 Unspecified jaundice: Secondary | ICD-10-CM | POA: Diagnosis not present

## 2022-01-16 DIAGNOSIS — D649 Anemia, unspecified: Secondary | ICD-10-CM

## 2022-01-16 DIAGNOSIS — R531 Weakness: Secondary | ICD-10-CM

## 2022-01-16 DIAGNOSIS — Z7982 Long term (current) use of aspirin: Secondary | ICD-10-CM

## 2022-01-16 DIAGNOSIS — Z7984 Long term (current) use of oral hypoglycemic drugs: Secondary | ICD-10-CM

## 2022-01-16 DIAGNOSIS — K8689 Other specified diseases of pancreas: Principal | ICD-10-CM | POA: Diagnosis present

## 2022-01-16 DIAGNOSIS — Z6828 Body mass index (BMI) 28.0-28.9, adult: Secondary | ICD-10-CM

## 2022-01-16 DIAGNOSIS — Y848 Other medical procedures as the cause of abnormal reaction of the patient, or of later complication, without mention of misadventure at the time of the procedure: Secondary | ICD-10-CM | POA: Diagnosis not present

## 2022-01-16 DIAGNOSIS — Z419 Encounter for procedure for purposes other than remedying health state, unspecified: Secondary | ICD-10-CM

## 2022-01-16 DIAGNOSIS — E785 Hyperlipidemia, unspecified: Secondary | ICD-10-CM | POA: Diagnosis present

## 2022-01-16 DIAGNOSIS — Z20822 Contact with and (suspected) exposure to covid-19: Secondary | ICD-10-CM | POA: Diagnosis present

## 2022-01-16 DIAGNOSIS — I10 Essential (primary) hypertension: Secondary | ICD-10-CM | POA: Diagnosis present

## 2022-01-16 DIAGNOSIS — R748 Abnormal levels of other serum enzymes: Secondary | ICD-10-CM | POA: Diagnosis present

## 2022-01-16 LAB — CBC WITH DIFFERENTIAL/PLATELET
Abs Immature Granulocytes: 0 10*3/uL (ref 0.00–0.07)
Basophils Absolute: 0.1 10*3/uL (ref 0.0–0.1)
Basophils Relative: 1 %
Eosinophils Absolute: 0.1 10*3/uL (ref 0.0–0.5)
Eosinophils Relative: 1 %
HCT: 28.3 % — ABNORMAL LOW (ref 36.0–46.0)
Hemoglobin: 9.8 g/dL — ABNORMAL LOW (ref 12.0–15.0)
Lymphocytes Relative: 23 %
Lymphs Abs: 1.7 10*3/uL (ref 0.7–4.0)
MCH: 29.1 pg (ref 26.0–34.0)
MCHC: 34.6 g/dL (ref 30.0–36.0)
MCV: 84 fL (ref 80.0–100.0)
Monocytes Absolute: 0.3 10*3/uL (ref 0.1–1.0)
Monocytes Relative: 4 %
Neutro Abs: 5.4 10*3/uL (ref 1.7–7.7)
Neutrophils Relative %: 71 %
Platelets: 202 10*3/uL (ref 150–400)
RBC: 3.37 MIL/uL — ABNORMAL LOW (ref 3.87–5.11)
RDW: 33.8 % — ABNORMAL HIGH (ref 11.5–15.5)
WBC: 7.6 10*3/uL (ref 4.0–10.5)
nRBC: 0 % (ref 0.0–0.2)
nRBC: 0 /100 WBC

## 2022-01-16 LAB — COMPREHENSIVE METABOLIC PANEL
ALT: 343 U/L — ABNORMAL HIGH (ref 0–44)
AST: 269 U/L — ABNORMAL HIGH (ref 15–41)
Albumin: 2.7 g/dL — ABNORMAL LOW (ref 3.5–5.0)
Alkaline Phosphatase: 959 U/L — ABNORMAL HIGH (ref 38–126)
Anion gap: 9 (ref 5–15)
BUN: 19 mg/dL (ref 8–23)
CO2: 21 mmol/L — ABNORMAL LOW (ref 22–32)
Calcium: 9.7 mg/dL (ref 8.9–10.3)
Chloride: 103 mmol/L (ref 98–111)
Creatinine, Ser: 0.8 mg/dL (ref 0.44–1.00)
GFR, Estimated: >60 mL/min (ref >60)
Glucose, Bld: 165 mg/dL — ABNORMAL HIGH (ref 70–99)
Potassium: 3.5 mmol/L (ref 3.5–5.1)
Sodium: 133 mmol/L — ABNORMAL LOW (ref 135–145)
Total Bilirubin: 39.3 mg/dL (ref 0.3–1.2)
Total Protein: 6.8 g/dL (ref 6.5–8.1)

## 2022-01-16 LAB — PROTIME-INR
INR: 1.3 — ABNORMAL HIGH (ref 0.8–1.2)
Prothrombin Time: 16.5 seconds — ABNORMAL HIGH (ref 11.4–15.2)

## 2022-01-16 LAB — RESP PANEL BY RT-PCR (FLU A&B, COVID) ARPGX2
Influenza A by PCR: NEGATIVE
Influenza B by PCR: NEGATIVE
SARS Coronavirus 2 by RT PCR: NEGATIVE

## 2022-01-16 LAB — LIPASE, BLOOD: Lipase: 45 U/L (ref 11–51)

## 2022-01-16 LAB — AMMONIA: Ammonia: 46 umol/L — ABNORMAL HIGH (ref 9–35)

## 2022-01-16 LAB — APTT: aPTT: 36 seconds (ref 24–36)

## 2022-01-16 LAB — ACETAMINOPHEN LEVEL: Acetaminophen (Tylenol), Serum: <10 ug/mL — ABNORMAL LOW (ref 10–30)

## 2022-01-16 MED ORDER — IOHEXOL 300 MG/ML  SOLN
100.0000 mL | Freq: Once | INTRAMUSCULAR | Status: AC | PRN
  Administered 2022-01-16: 100 mL via INTRAVENOUS

## 2022-01-16 NOTE — ED Provider Notes (Addendum)
[facility] EMERGENCY DEPARTMENT Provider Note   CSN: [id number] Arrival date & time: 01/16/22  1632     History  Chief Complaint  Patient presents with   Abnormal Lab    Miranda Francis is a 76 y.o. female.  The history is provided by the patient and medical records.  Abnormal Lab  76 year old female with history of diabetes, hypertension, hyperlipidemia, presenting to the ED with jaundice.  States this has been worsening over the past month.  She reports initially saw PCP who noticed abnormal labs and referred her to GI.  States she had to wait several weeks before being seen.  She did have 1 visit with Dr. Collene Mares, had repeat labs and was scheduled for MRI on Saturday.  States she was called today and notified that her labs were very abnormal and she needed to come to the ED for admission.  She denies any significant abdominal pain but has been having a lot of nausea when smelling food and only able to eat small amounts.  She has started having generalized bodily itching over the past few days as well.  She has not had any vomiting or diarrhea.  Occasionally takes Tylenol but not excessive use.  She does take baby aspirin but no other formal anticoagulation.  She has no history of liver issues in the past.  Home Medications Prior to Admission medications   Medication Sig Start Date End Date Taking? Authorizing Provider  acetaminophen (TYLENOL) 500 MG tablet Take 1,000 mg by mouth every 6 (six) hours as needed for moderate pain or headache.   Yes [provider]  amLODipine (NORVASC) 5 MG tablet Take 5 mg by mouth daily. 10/17/21  Yes [provider]  aspirin 81 MG chewable tablet Chew 81 mg by mouth daily.     Yes [provider]  Cholecalciferol (QC VITAMIN D3) 50 MCG (2000 UT) TABS Take 2,000 Units by mouth daily.   Yes [provider]  glipiZIDE (GLUCOTROL) 5 MG tablet Take 2.5 mg by mouth 2 (two) times daily with a meal. 12/18/21  Yes  [provider]  ibuprofen (ADVIL) 200 MG tablet Take 400 mg by mouth every 6 (six) hours as needed for moderate pain or headache.   Yes [provider]  KLOR-CON M20 20 MEQ tablet Take 20 mEq by mouth 2 (two) times daily. 01/14/22  Yes [provider]  losartan (COZAAR) 100 MG tablet Take 1 tablet (100 mg total) by mouth daily. 11/03/11 01/16/22 Yes Kathie Dike, MD      Allergies    Patient has no known allergies.    Review of Systems   Review of Systems  Gastrointestinal:  Positive for abdominal pain.       Jaundice  All other systems reviewed and are negative.  Physical Exam Updated Vital Signs BP (!) 158/80    Pulse (!) 57    Temp 98.8 F (37.1 C) (Oral)    Resp 15    SpO2 99%   Physical Exam Vitals and nursing note reviewed.  Constitutional:      Appearance: She is well-developed.  HENT:     Head: Normocephalic and atraumatic.  Eyes:     General: Scleral icterus present.     Conjunctiva/sclera: Conjunctivae normal.     Pupils: Pupils are equal, round, and reactive to light.  Cardiovascular:     Rate and Rhythm: Normal rate and regular rhythm.     Heart sounds: Normal heart sounds.  Pulmonary:  Effort: Pulmonary effort is normal.     Breath sounds: Normal breath sounds.  Abdominal:     General: Bowel sounds are normal.     Palpations: Abdomen is soft.     Tenderness: There is no abdominal tenderness. There is no rebound.  Musculoskeletal:        General: Normal range of motion.     Cervical back: Normal range of motion.  Skin:    General: Skin is warm and dry.     Comments: Diffuse bodily jaundice  Neurological:     Mental Status: She is alert and oriented to person, place, and time.    ED Results / Procedures / Treatments   Labs (all labs ordered are listed, but only abnormal results are displayed) Labs Reviewed  CBC WITH DIFFERENTIAL/PLATELET - Abnormal; Notable for the following components:      Result Value   RBC 3.37 (*)     Hemoglobin 9.8 (*)    HCT 28.3 (*)    RDW 33.8 (*)    All other components within normal limits  COMPREHENSIVE METABOLIC PANEL - Abnormal; Notable for the following components:   Sodium 133 (*)    CO2 21 (*)    Glucose, Bld 165 (*)    Albumin 2.7 (*)    AST 269 (*)    ALT 343 (*)    Alkaline Phosphatase 959 (*)    Total Bilirubin 39.3 (*)    All other components within normal limits  PROTIME-INR - Abnormal; Notable for the following components:   Prothrombin Time 16.5 (*)    INR 1.3 (*)    All other components within normal limits  ACETAMINOPHEN LEVEL - Abnormal; Notable for the following components:   Acetaminophen (Tylenol), Serum <10 (*)    All other components within normal limits  AMMONIA - Abnormal; Notable for the following components:   Ammonia 46 (*)    All other components within normal limits  RESP PANEL BY RT-PCR (FLU A&B, COVID) ARPGX2  LIPASE, BLOOD  APTT  URINALYSIS, ROUTINE W REFLEX MICROSCOPIC  CANCER ANTIGEN 19-9    EKG None  Radiology CT ABDOMEN PELVIS W CONTRAST  Result Date: 01/16/2022 CLINICAL DATA:  Jaundice EXAM: CT ABDOMEN AND PELVIS WITH CONTRAST TECHNIQUE: Multidetector CT imaging of the abdomen and pelvis was performed using the standard protocol following bolus administration of intravenous contrast. RADIATION DOSE REDUCTION: This exam was performed according to the departmental dose-optimization program which includes automated exposure control, adjustment of the mA and/or kV according to patient size and/or use of iterative reconstruction technique. CONTRAST:  145m OMNIPAQUE IOHEXOL 300 MG/ML  SOLN COMPARISON:  CT 10/31/2018 FINDINGS: Lower chest: Lung bases demonstrate no acute consolidation or effusion. Normal cardiac size. Hepatobiliary: Marked intra hepatic biliary dilatation. Dilated common bile duct measuring up to 16 mm. Distended gallbladder without stones. Pancreas: Atrophic body and tail with marked pancreatic ductal dilatation up to 13  mm. Ill-defined hypodense mass at the pancreatic head/neck region measuring approximately 3.9 by 2.6 cm, series 3, image 31, there is a coarse calcification. Spleen: Normal in size without focal abnormality. Adrenals/Urinary Tract: Adrenal glands are within normal limits. Kidneys show no hydronephrosis. The bladder is unremarkable Stomach/Bowel: The stomach is nonenlarged. No dilated small bowel. No acute bowel wall thickening. Negative appendix. Diverticular disease of the sigmoid colon. Vascular/Lymphatic: Nonaneurysmal aorta. No suspicious lymph nodes. Mild aortic atherosclerosis. Marked narrowing of the SMV just before the portal vein confluence, by the ill-defined mass. Reproductive: No adnexal mass. Focal endometrial thickening  up to 9 mm. Other: Negative for pelvic effusion or free air. Musculoskeletal: No acute osseous abnormality IMPRESSION: 1. Marked intra and extrahepatic biliary dilatation with marked dilatation of pancreatic duct and distended gallbladder. There is significant atrophy of the body and tail of pancreas. 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 2. Diverticular disease of the colon without acute wall thickening 3. Mild endometrial thickening up to 9 mm, recommend nonemergent pelvic ultrasound for further evaluation Electronically Signed   By: Donavan Foil M.D.   On: 01/16/2022 23:38    Procedures Procedures    Medications Ordered in ED Medications  iohexol (OMNIPAQUE) 300 MG/ML solution 100 mL (100 mLs Intravenous Contrast Given 01/16/22 2326)    ED Course/ Medical Decision Making/ A&P                           Medical Decision Making Amount and/or Complexity of Data Reviewed External Data Reviewed: labs and notes. Labs: ordered. Radiology: ordered and independent interpretation performed. ECG/medicine tests: ordered and  independent interpretation performed.  Risk Prescription drug management. Decision regarding hospitalization.   76 y.o. F presenting to the ED for jaundice.  States worsening over the past month.  Has been seen by GI (Dr. Collene Mares) and scheduled for MRI this coming weekend, however after very abnormal labs she was into the ED for admission.  On exam patient is obviously jaundiced with scleral icterus.  Her abdomen is soft and she has no elicited tenderness.  Does admit to only being able to eat small amounts and nausea when smelling food.  Labs from triage with bili of 39, also has transaminitis and significantly elevated alk phos.  She is mentating appropriately and does not appear confused.  Will add on coags, Tylenol level, ammonia, COVID screen.  We will plan for CT scan.  Consider cancer versus other biliary pathology such as choledocholithiasis, etc.  CT scan with findings of ill defined mass in pancreatic head concerning for pancreatic carcinoma.  Results discussed with patient, she acknowledged understanding.  Ammonia is mildly elevated but again she is mentating appropriately.  We will add on CA 19-9.  She will require admission.  Family has gone home for the evening, she is asked that we do not disclose information to family until she is ready, would like to have more information before informing them  Discussed with Dr. Hal Hope-- will admit for ongoing care.  Final Clinical Impression(s) / ED Diagnoses Final diagnoses:  Pancreatic mass  Jaundice    Rx / DC Orders ED Discharge Orders     None         Larene Pickett, PA-C 01/17/22 0012    Larene Pickett, PA-C 01/17/22 0012    Ripley Fraise, MD 01/17/22 [phone]

## 2022-01-16 NOTE — ED Provider Triage Note (Signed)
Emergency Medicine Provider Triage Evaluation Note ? ?Miranda Francis , a 76 y.o. female  was evaluated in triage.  Pt complains of 1 month of yellow skin discoloration and abnormally colored urine and stool.  Was called this morning by the gastroenterology office and told to come to the ED for admission.  Stool described as pale white and urine described as orange-yellow.  Denies abdominal pain.  Denies recent illness or fever.  Denies other urinary or bowel type symptoms.  Denies N/V. ? ?Review of Systems  ?Positive: As above ?Negative: As above ? ?Physical Exam  ?BP 139/87 (BP Location: Right Arm)   Pulse 72   Temp 98.8 ?F (37.1 ?C) (Oral)   Resp 18   SpO2 100%  ?Gen:   Awake, no distress   ?Resp:  Normal effort, CTAB ?MSK:   Moves extremities without difficulty  ?Other:  Jaundice, scleral icterus, non-TTP abdomen ? ?Medical Decision Making  ?Medically screening exam initiated at 6:43 PM.  Appropriate orders placed.  Miranda Francis was informed that the remainder of the evaluation will be completed by another provider, this initial triage assessment does not replace that evaluation, and the importance of remaining in the ED until their evaluation is complete. ? ?Labs ordered ?  ?Miranda Rome, PA-C ?89/38/10 1851 ? ?

## 2022-01-16 NOTE — ED Triage Notes (Signed)
Pt reports yellow skin color for "about a month." Went to PCP at [facility] who drew blood and advised her to come to ED after results of elevated liver enzymes.  ?

## 2022-01-17 ENCOUNTER — Inpatient Hospital Stay (HOSPITAL_COMMUNITY): Payer: Medicare HMO

## 2022-01-17 ENCOUNTER — Encounter (HOSPITAL_COMMUNITY): Payer: Self-pay | Admitting: Internal Medicine

## 2022-01-17 DIAGNOSIS — E440 Moderate protein-calorie malnutrition: Secondary | ICD-10-CM | POA: Diagnosis present

## 2022-01-17 DIAGNOSIS — Z20822 Contact with and (suspected) exposure to covid-19: Secondary | ICD-10-CM | POA: Diagnosis present

## 2022-01-17 DIAGNOSIS — Z79899 Other long term (current) drug therapy: Secondary | ICD-10-CM | POA: Diagnosis not present

## 2022-01-17 DIAGNOSIS — Z800 Family history of malignant neoplasm of digestive organs: Secondary | ICD-10-CM | POA: Diagnosis not present

## 2022-01-17 DIAGNOSIS — R748 Abnormal levels of other serum enzymes: Secondary | ICD-10-CM | POA: Diagnosis present

## 2022-01-17 DIAGNOSIS — K859 Acute pancreatitis without necrosis or infection, unspecified: Secondary | ICD-10-CM | POA: Diagnosis not present

## 2022-01-17 DIAGNOSIS — R7401 Elevation of levels of liver transaminase levels: Secondary | ICD-10-CM | POA: Diagnosis not present

## 2022-01-17 DIAGNOSIS — R17 Unspecified jaundice: Secondary | ICD-10-CM | POA: Diagnosis present

## 2022-01-17 DIAGNOSIS — Z6828 Body mass index (BMI) 28.0-28.9, adult: Secondary | ICD-10-CM | POA: Diagnosis not present

## 2022-01-17 DIAGNOSIS — K831 Obstruction of bile duct: Secondary | ICD-10-CM | POA: Diagnosis present

## 2022-01-17 DIAGNOSIS — L298 Other pruritus: Secondary | ICD-10-CM | POA: Diagnosis not present

## 2022-01-17 DIAGNOSIS — Z833 Family history of diabetes mellitus: Secondary | ICD-10-CM | POA: Diagnosis not present

## 2022-01-17 DIAGNOSIS — K8689 Other specified diseases of pancreas: Secondary | ICD-10-CM | POA: Diagnosis not present

## 2022-01-17 DIAGNOSIS — E876 Hypokalemia: Secondary | ICD-10-CM | POA: Diagnosis not present

## 2022-01-17 DIAGNOSIS — C259 Malignant neoplasm of pancreas, unspecified: Secondary | ICD-10-CM | POA: Diagnosis present

## 2022-01-17 DIAGNOSIS — I10 Essential (primary) hypertension: Secondary | ICD-10-CM | POA: Diagnosis present

## 2022-01-17 DIAGNOSIS — L299 Pruritus, unspecified: Secondary | ICD-10-CM | POA: Diagnosis not present

## 2022-01-17 DIAGNOSIS — Z7984 Long term (current) use of oral hypoglycemic drugs: Secondary | ICD-10-CM | POA: Diagnosis not present

## 2022-01-17 DIAGNOSIS — D630 Anemia in neoplastic disease: Secondary | ICD-10-CM | POA: Diagnosis present

## 2022-01-17 DIAGNOSIS — E785 Hyperlipidemia, unspecified: Secondary | ICD-10-CM | POA: Diagnosis present

## 2022-01-17 DIAGNOSIS — E119 Type 2 diabetes mellitus without complications: Secondary | ICD-10-CM | POA: Diagnosis present

## 2022-01-17 DIAGNOSIS — Z7982 Long term (current) use of aspirin: Secondary | ICD-10-CM | POA: Diagnosis not present

## 2022-01-17 DIAGNOSIS — K9189 Other postprocedural complications and disorders of digestive system: Secondary | ICD-10-CM | POA: Diagnosis not present

## 2022-01-17 DIAGNOSIS — Y848 Other medical procedures as the cause of abnormal reaction of the patient, or of later complication, without mention of misadventure at the time of the procedure: Secondary | ICD-10-CM | POA: Diagnosis not present

## 2022-01-17 LAB — URINALYSIS, ROUTINE W REFLEX MICROSCOPIC
Glucose, UA: NEGATIVE mg/dL
Hgb urine dipstick: NEGATIVE
Ketones, ur: NEGATIVE mg/dL
Leukocytes,Ua: NEGATIVE
Nitrite: NEGATIVE
Protein, ur: NEGATIVE mg/dL
Specific Gravity, Urine: >1.046 — ABNORMAL HIGH (ref 1.005–1.030)
pH: 5 (ref 5.0–8.0)

## 2022-01-17 LAB — GLUCOSE, CAPILLARY
Glucose-Capillary: 121 mg/dL — ABNORMAL HIGH (ref 70–99)
Glucose-Capillary: 113 mg/dL — ABNORMAL HIGH (ref 70–99)
Glucose-Capillary: 179 mg/dL — ABNORMAL HIGH (ref 70–99)
Glucose-Capillary: 103 mg/dL — ABNORMAL HIGH (ref 70–99)

## 2022-01-17 LAB — HEMOGLOBIN A1C
Hgb A1c MFr Bld: 4.6 % — ABNORMAL LOW (ref 4.8–5.6)
Mean Plasma Glucose: 85.32 mg/dL

## 2022-01-17 MED ORDER — AMLODIPINE BESYLATE 5 MG PO TABS
5.0000 mg | ORAL_TABLET | Freq: Every day | ORAL | Status: DC
  Administered 2022-01-17 – 2022-01-25 (×9): 5 mg via ORAL
  Filled 2022-01-17 (×10): qty 1

## 2022-01-17 MED ORDER — LOSARTAN POTASSIUM 50 MG PO TABS
100.0000 mg | ORAL_TABLET | Freq: Every day | ORAL | Status: DC
  Administered 2022-01-17 – 2022-01-18 (×2): 100 mg via ORAL
  Filled 2022-01-17 (×2): qty 2

## 2022-01-17 MED ORDER — INSULIN ASPART 100 UNIT/ML IJ SOLN
0.0000 [IU] | Freq: Three times a day (TID) | INTRAMUSCULAR | Status: DC
  Administered 2022-01-17 – 2022-01-19 (×3): 1 [IU] via SUBCUTANEOUS
  Administered 2022-01-19: 2 [IU] via SUBCUTANEOUS
  Administered 2022-01-19: 1 [IU] via SUBCUTANEOUS
  Administered 2022-01-20: 2 [IU] via SUBCUTANEOUS
  Administered 2022-01-21: 1 [IU] via SUBCUTANEOUS
  Administered 2022-01-22: 2 [IU] via SUBCUTANEOUS
  Administered 2022-01-23 – 2022-01-24 (×4): 1 [IU] via SUBCUTANEOUS
  Administered 2022-01-24: 2 [IU] via SUBCUTANEOUS
  Administered 2022-01-25: 1 [IU] via SUBCUTANEOUS
  Administered 2022-01-25: 2 [IU] via SUBCUTANEOUS

## 2022-01-17 MED ORDER — GADOBUTROL 1 MMOL/ML IV SOLN
7.5000 mL | Freq: Once | INTRAVENOUS | Status: AC | PRN
  Administered 2022-01-17: 7.5 mL via INTRAVENOUS

## 2022-01-17 MED ORDER — ENSURE ENLIVE PO LIQD
237.0000 mL | Freq: Two times a day (BID) | ORAL | Status: DC
  Administered 2022-01-17: 09:00:00 237 mL via ORAL

## 2022-01-17 NOTE — Assessment & Plan Note (Addendum)
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  Elevated ALP, AST, ALT.  Continue monitoring.  Lipase level has significantly improved ,Liver enzymes also improving along with bilirubin

## 2022-01-17 NOTE — Progress Notes (Signed)
Mobility Specialist Progress Note  ? ? 01/17/22 1108  ?Mobility  ?Activity Ambulated independently in hallway  ?Level of Assistance Standby assist, set-up cues, supervision of patient - no hands on  ?Assistive Device None  ?Distance Ambulated (ft) 550 ft  ?Activity Response Tolerated well  ?$Mobility charge 1 Mobility  ? ?Pt received in bed and agreeable. Had void in BR. No complaints on walk. Returned to bed with call bell in reach.   ? ?Hildred Alamin ?Mobility Specialist  ?M.S. 5N: [phone]  ?

## 2022-01-17 NOTE — Consult Note (Signed)
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 Referring Physician: [facility] hospitalist.  Miranda Francis is an 76 y.o. female.  HPI: Miranda Francis is a 76 year old black female with multiple medical problems including AODM, hypertension, who gives a 1 month history of abnormal weight loss and yellowing of her skin prompting her to go to her gastroenterologist at [facility] who informed her about her abnormal liver function test and asked her to come to the emergency room here at [facility].  She gives a history of a 30 pound weight loss over the last month and in the ER she was noted to have an alkaline phosphatase of 959 with albumin of 2 AST of 269 and ALT of 343 and a total bilirubin of 6.8.  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  The mass caused significant extrinsic narrowing at the post portal and superior mesenteric vein confluence as well as marked intra and extrahepatic biliary dilatation no distant mets were identified.  Had a Cologuard done 2 years ago at [facility] which she reportedly was sent was negative she denies any history of nausea vomiting melena hematochezia.  Appetite is poor and her weights been progressively going down.  Past Medical History:  Diagnosis Date   Blood transfusion    Diabetes mellitus    Heart murmur    Hypertension    Past Surgical History:  Procedure Laterality Date   DILATION AND CURETTAGE OF UTERUS     NO PAST SURGERIES      Family History  Problem Relation Age of Onset   Diabetes type II Mother    Diabetes type II Sister     Social History:  reports that she has never smoked. She has never used smokeless tobacco. She reports that she does not drink alcohol and  does not use drugs.  Allergies: No Known Allergies  Medications: I have reviewed the patient's current medications. Prior to Admission:  Medications Prior to Admission  Medication Sig Dispense Refill Last Dose   acetaminophen (TYLENOL) 500 MG tablet Take 1,000 mg by mouth every 6 (six) hours as needed for moderate pain or headache.   Past Week   amLODipine (NORVASC) 5 MG tablet Take 5 mg by mouth daily.   01/16/2022   aspirin 81 MG chewable tablet Chew 81 mg by mouth daily.     01/15/2022   Cholecalciferol (QC VITAMIN D3) 50 MCG (2000 UT) TABS Take 2,000 Units by mouth daily.   01/15/2022   glipiZIDE (GLUCOTROL) 5 MG tablet Take 2.5 mg by mouth 2 (two) times daily with a meal.   01/16/2022   ibuprofen (ADVIL) 200 MG tablet Take 400 mg by mouth every 6 (six) hours as needed for moderate pain or headache.   Past Month   KLOR-CON M20 20 MEQ tablet Take 20 mEq by mouth 2 (two) times daily.   01/16/2022   losartan (COZAAR) 100 MG tablet Take 1 tablet (100 mg total) by mouth daily. 30 tablet 0 01/16/2022   Scheduled:  amLODipine  5 mg Oral Daily   feeding supplement  237 mL Oral BID BM   insulin aspart  0-6 Units Subcutaneous TID WC   losartan  100 mg Oral Daily   Continuous: PRN:  Results for orders placed or performed during the hospital encounter of  01/16/22 (from the past 48 hour(s))  CBC with Differential     Status: Abnormal   Collection Time: 01/16/22  6:30 PM  Result Value Ref Range   WBC 7.6 4.0 - 10.5 K/uL   RBC 3.37 (L) 3.87 - 5.11 MIL/uL   Hemoglobin 9.8 (L) 12.0 - 15.0 g/dL   HCT 40.9 (L) 81.1 - 91.4 %   MCV 84.0 80.0 - 100.0 fL   MCH 29.1 26.0 - 34.0 pg   MCHC 34.6 30.0 - 36.0 g/dL   RDW 78.2 (H) 95.6 - 21.3 %   Platelets 202 150 - 400 K/uL   nRBC 0.0 0.0 - 0.2 %   Neutrophils Relative % 71 %   Neutro Abs 5.4 1.7 - 7.7 K/uL   Lymphocytes Relative 23 %   Lymphs Abs 1.7 0.7 - 4.0 K/uL   Monocytes Relative 4 %   Monocytes Absolute 0.3 0.1 - 1.0 K/uL   Eosinophils Relative 1 %    Eosinophils Absolute 0.1 0.0 - 0.5 K/uL   Basophils Relative 1 %   Basophils Absolute 0.1 0.0 - 0.1 K/uL   nRBC 0 0 /100 WBC   Abs Immature Granulocytes 0.00 0.00 - 0.07 K/uL   Polychromasia PRESENT    Target Cells PRESENT     Comment: Performed at [facility], [address], [place], [place] [address]  Comprehensive metabolic panel     Status: Abnormal   Collection Time: 01/16/22  6:30 PM  Result Value Ref Range   Sodium 133 (L) 135 - 145 mmol/L   Potassium 3.5 3.5 - 5.1 mmol/L   Chloride 103 98 - 111 mmol/L   CO2 21 (L) 22 - 32 mmol/L   Glucose, Bld 165 (H) 70 - 99 mg/dL    Comment: Glucose reference range applies only to samples taken after fasting for at least 8 hours.   BUN 19 8 - 23 mg/dL   Creatinine, Ser 8.46 0.44 - 1.00 mg/dL   Calcium 9.7 8.9 - 96.2 mg/dL   Total Protein 6.8 6.5 - 8.1 g/dL   Albumin 2.7 (L) 3.5 - 5.0 g/dL   AST 952 (H) 15 - 41 U/L   ALT 343 (H) 0 - 44 U/L   Alkaline Phosphatase 959 (H) 38 - 126 U/L   Total Bilirubin 39.3 (HH) 0.3 - 1.2 mg/dL    Comment: ICTERIC SPECIMEN RESULTS CONFIRMED BY MANUAL DILUTION CRITICAL RESULT CALLED TO, READ BACK BY AND VERIFIED WITH: C STRAUGHN,RN 2020/2054 01/16/2022 WBOND    GFR, Estimated >60 >60 mL/min    Comment: (NOTE) Calculated using the CKD-EPI Creatinine Equation (2021)    Anion gap 9 5 - 15    Comment: Performed at [facility], [address], [place], [place] [address]  Lipase, blood     Status: None   Collection Time: 01/16/22 10:50 PM  Result Value Ref Range   Lipase 45 11 - 51 U/L    Comment: Performed at [facility], [address] [place], [place] [address]  Protime-INR     Status: Abnormal   Collection Time: 01/16/22 10:50 PM  Result Value Ref Range   Prothrombin Time 16.5 (H) 11.4 - 15.2 seconds   INR 1.3 (H) 0.8 - 1.2    Comment: (NOTE) INR goal varies based on device and disease states. Performed at [facility], [address] [place], [place] [address]    APTT     Status: None   Collection Time: 01/16/22  10:50 PM  Result Value Ref Range   aPTT 36 24 - 36 seconds    Comment: Performed at [facility], [address] [place], [place] [address]  Acetaminophen level     Status: Abnormal   Collection Time: 01/16/22 10:50 PM  Result Value Ref Range   Acetaminophen (Tylenol), Serum <10 (L) 10 - 30 ug/mL    Comment: (NOTE) Therapeutic concentrations vary significantly. A range of 10-30 ug/mL  may be an effective concentration for many patients. However, some  are best treated at concentrations outside of this range. Acetaminophen concentrations >150 ug/mL at 4 hours after ingestion  and >50 ug/mL at 12 hours after ingestion are often associated with  toxic reactions.  Performed at [facility], [address] [place], [place] [address]   Resp Panel by RT-PCR (Flu A&B, Covid) Nasopharyngeal Swab     Status: None   Collection Time: 01/16/22 10:50 PM   Specimen: Nasopharyngeal Swab; Nasopharyngeal(NP) swabs in vial transport medium  Result Value Ref Range   SARS Coronavirus 2 by RT PCR NEGATIVE NEGATIVE    Comment: (NOTE) SARS-CoV-2 target nucleic acids are NOT DETECTED.  The SARS-CoV-2 RNA is generally detectable in upper respiratory specimens during the acute phase of infection. The lowest concentration of SARS-CoV-2 viral copies this assay can detect is 138 copies/mL. A negative result does not preclude SARS-Cov-2 infection and should not be used as the sole basis for treatment or other patient management decisions. A negative result may occur with  improper specimen collection/handling, submission of specimen other than nasopharyngeal swab, presence of viral mutation(s) within the areas targeted by this assay, and inadequate number of viral copies(<138 copies/mL). A negative result must be combined with clinical observations, patient history, and epidemiological information. The expected result is Negative.  Fact  Sheet for Patients:  BloggerCourse.com  Fact Sheet for Healthcare Providers:  SeriousBroker.it  This test is no t yet approved or cleared by the [place] FDA and  has been authorized for detection and/or diagnosis of SARS-CoV-2 by FDA under an Emergency Use Authorization (EUA). This EUA will remain  in effect (meaning this test can be used) for the duration of the COVID-19 declaration under Section 564(b)(1) of the Act, 21 U.S.C.section 360bbb-3(b)(1), unless the authorization is terminated  or revoked sooner.       Influenza A by PCR NEGATIVE NEGATIVE   Influenza B by PCR NEGATIVE NEGATIVE    Comment: (NOTE) The Xpert Xpress SARS-CoV-2/FLU/RSV plus assay is intended as an aid in the diagnosis of influenza from Nasopharyngeal swab specimens and should not be used as a sole basis for treatment. Nasal washings and aspirates are unacceptable for Xpert Xpress SARS-CoV-2/FLU/RSV testing.  Fact Sheet for Patients: BloggerCourse.com  Fact Sheet for Healthcare Providers: SeriousBroker.it  This test is not yet approved or cleared by the [place] FDA and has been authorized for detection and/or diagnosis of SARS-CoV-2 by FDA under an Emergency Use Authorization (EUA). This EUA will remain in effect (meaning this test can be used) for the duration of the COVID-19 declaration under Section 564(b)(1) of the Act, 21 U.S.C. section 360bbb-3(b)(1), unless the authorization is terminated or revoked.  Performed at [facility], [address] [place], [place] [address]   Ammonia     Status: Abnormal   Collection Time: 01/16/22 10:50 PM  Result Value Ref Range   Ammonia 46 (H) 9 - 35 umol/L    Comment: Performed at [facility], [address] [place], [place] [address]  Hemoglobin A1c     Status: Abnormal   Collection Time: 01/17/22  1:18 AM  Result Value Ref Range    Hgb A1c MFr Bld 4.6 (L) 4.8 - 5.6 %    Comment: (NOTE) Pre diabetes:          5.7%-6.4%  Diabetes:              >6.4%  Glycemic control for   <7.0% adults with diabetes    Mean Plasma Glucose 85.32 mg/dL    Comment: Performed at [facility], [address], [place], [place] [address]  Urinalysis, Routine w reflex microscopic     Status: Abnormal   Collection Time: 01/17/22  3:19 AM  Result Value Ref Range   Color, Urine AMBER (A) YELLOW    Comment: BIOCHEMICALS MAY BE AFFECTED BY COLOR   APPearance CLEAR CLEAR   Specific Gravity, Urine >1.046 (H) 1.005 - 1.030   pH 5.0 5.0 - 8.0   Glucose, UA NEGATIVE NEGATIVE mg/dL   Hgb urine dipstick NEGATIVE NEGATIVE   Bilirubin Urine MODERATE (A) NEGATIVE   Ketones, ur NEGATIVE NEGATIVE mg/dL   Protein, ur NEGATIVE NEGATIVE mg/dL   Nitrite NEGATIVE NEGATIVE   Leukocytes,Ua NEGATIVE NEGATIVE    Comment: Performed at [facility], [address], [place], [place] [address]    CT ABDOMEN PELVIS W CONTRAST  Result Date: 01/16/2022 CLINICAL DATA:  Jaundice EXAM: CT ABDOMEN AND PELVIS WITH CONTRAST TECHNIQUE: Multidetector CT imaging of the abdomen and pelvis was performed using the standard protocol following bolus administration of intravenous contrast. RADIATION DOSE REDUCTION: This exam was performed according to the departmental dose-optimization program which includes automated exposure control, adjustment of the mA and/or kV according to patient size and/or use of iterative reconstruction technique. CONTRAST:  OMNIPAQUE IOHEXOL 300 MG/ML  SOLN COMPARISON:  CT 10/31/2018 FINDINGS: Lower chest: Lung bases demonstrate no acute consolidation or effusion. Normal cardiac size. Hepatobiliary: Marked intra hepatic biliary dilatation. Dilated common bile duct measuring up to 16 mm. Distended gallbladder without stones. Pancreas: Atrophic body and tail with marked pancreatic ductal dilatation up to 13 mm. Ill-defined hypodense mass at  the pancreatic head/neck region measuring approximately 3.9 by 2.6 cm, series 3, image 31, there is a coarse calcification. Spleen: Normal in size without focal abnormality. Adrenals/Urinary Tract: Adrenal glands are within normal limits. Kidneys show no hydronephrosis. The bladder is unremarkable Stomach/Bowel: The stomach is nonenlarged. No dilated small bowel. No acute bowel wall thickening. Negative appendix. Diverticular disease of the sigmoid colon. Vascular/Lymphatic: Nonaneurysmal aorta. No suspicious lymph nodes. Mild aortic atherosclerosis. Marked narrowing of the SMV just before the portal vein confluence, by the ill-defined mass. Reproductive: No adnexal mass. Focal endometrial thickening up to 9 mm. Other: Negative for pelvic effusion or free air. Musculoskeletal: No acute osseous abnormality IMPRESSION: 1. Marked intra and extrahepatic biliary dilatation with marked dilatation of pancreatic duct and distended gallbladder. There is significant atrophy of the body and tail of pancreas. 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 2. Diverticular disease of the colon without acute wall thickening 3. Mild endometrial thickening up to 9 mm, recommend nonemergent pelvic ultrasound for further evaluation Electronically Signed   By: Jasmine Pang M.D.   On: 01/16/2022 23:38    Review of Systems  Constitutional:  Positive for fatigue and unexpected weight change.  Eyes: Negative.   Respiratory: Negative.    Cardiovascular: Negative.   Endocrine: Negative.   Genitourinary: Negative.   Allergic/Immunologic: Negative.   Neurological: Negative.   Hematological: Negative.   Psychiatric/Behavioral: Negative.    Blood pressure (!) 146/78, pulse (!) 57, temperature 98.2 F (36.8 C), temperature source Oral, resp. rate 14, height 5\' 3"  (1.6 m), weight 72.5 kg, SpO2  100 %. Physical Exam Constitutional:      General: She is not in acute distress. HENT:     Head: Normocephalic and atraumatic.  Eyes:     General: Scleral icterus present.  Pulmonary:     Effort: Pulmonary effort is normal.     Breath sounds: Normal breath sounds and air entry.  Abdominal:     General: Abdomen is protuberant. Bowel sounds are normal.     Palpations: Abdomen is soft.     Tenderness: There is no abdominal tenderness.  Skin:    General: Skin is warm.     Coloration: Skin is jaundiced.  Neurological:     Mental Status: She is alert and oriented to person, place, and time.  Psychiatric:        Attention and Perception: Attention and perception normal.        Mood and Affect: Mood normal.        Speech: Speech normal.        Behavior: Behavior normal.        Thought Content: Thought content normal.    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  Further recommendation made thereafter 2) Hypertension. 3) AODM. Charna Elizabeth 01/17/2022, 7:23 AM

## 2022-01-17 NOTE — Anesthesia Preprocedure Evaluation (Addendum)
Anesthesia Evaluation  ?Patient identified by MRN, date of birth, ID band ?Patient awake ? ? ? ?Reviewed: ?Allergy & Precautions, NPO status , Patient's Chart, lab work & pertinent test results ? ?Airway ?Mallampati: II ? ?TM Distance: >3 FB ?Neck ROM: Full ? ? ? Dental ? ?(+) Missing, Chipped, Dental Advisory Given,  ?  ?Pulmonary ?neg pulmonary ROS,  ?  ?Pulmonary exam normal ?breath sounds clear to auscultation ? ? ? ? ? ? Cardiovascular ?hypertension, Pt. on medications ?Normal cardiovascular exam ?Rhythm:Regular Rate:Normal ? ? ?  ?Neuro/Psych ?negative neurological ROS ? negative psych ROS  ? GI/Hepatic ?negative GI ROS, Neg liver ROS,   ?Endo/Other  ?diabetes, Type 2, Oral Hypoglycemic Agents ? Renal/GU ?negative Renal ROS  ?negative genitourinary ?  ?Musculoskeletal ?negative musculoskeletal ROS ?(+)  ? Abdominal ?  ?Peds ? Hematology ? ?(+) Blood dyscrasia, anemia , Lab Results ?     Component                Value               Date                 ?     WBC                      7.6                 01/16/2022           ?     HGB                      9.8 (L)             01/16/2022           ?     HCT                      28.3 (L)            01/16/2022           ?     MCV                      84.0                01/16/2022           ?     PLT                      202                 01/16/2022           ?   ?Anesthesia Other Findings ? ? Reproductive/Obstetrics ? ?  ? ? ? ? ? ? ? ? ? ? ? ? ? ?  ?  ? ? ? ? ? ? ? ?Anesthesia Physical ?Anesthesia Plan ? ?ASA: 2 ? ?Anesthesia Plan: General  ? ?Post-op Pain Management:   ? ?Induction: Intravenous ? ?PONV Risk Score and Plan: 3 and Dexamethasone, Ondansetron and Treatment may vary due to age or medical condition ? ?Airway Management Planned: Oral ETT ? ?Additional Equipment:  ? ?Intra-op Plan:  ? ?Post-operative Plan: Extubation in OR ? ?Informed Consent: I have reviewed the patients History and Physical, chart, labs and discussed  the procedure including the risks, benefits and alternatives for the proposed anesthesia with the patient or  authorized representative who has indicated his/her understanding and acceptance.  ? ? ? ?Dental advisory given ? ?Plan Discussed with: CRNA ? ?Anesthesia Plan Comments:   ? ? ? ? ? ? ?Anesthesia Quick Evaluation ? ?

## 2022-01-17 NOTE — Progress Notes (Signed)
Pt requested an AD.  Form was left for patient. Pt out of room.  Made nurse aware of location of AD.  Chaplain available as needed. ? ?Cristopher Peru, [facility], Pager [phone]   ?

## 2022-01-17 NOTE — Plan of Care (Signed)
?  Problem: Education: Goal: Knowledge of General Education information will improve Description: Including pain rating scale, medication(s)/side effects and non-pharmacologic comfort measures Outcome: Progressing   Problem: Health Behavior/Discharge Planning: Goal: Ability to manage health-related needs will improve Outcome: Progressing   Problem: Coping: Goal: Level of anxiety will decrease Outcome: Progressing   Problem: Safety: Goal: Ability to remain free from injury will improve Outcome: Progressing   Problem: Skin Integrity: Goal: Risk for impaired skin integrity will decrease Outcome: Progressing   

## 2022-01-17 NOTE — H&P (Addendum)
History and Physical    Miranda Francis YTK:[id number] DOB: [birth year] DOA: 01/16/2022  PCP: [name], MD  Patient coming from: Home.  Chief Complaint: Abnormal labs.  HPI: Miranda Francis is a 76 y.o. female with history of diabetes mellitus type 2, hypertension, heart murmur has been noticing getting more yellow over the last 1 month and has been having poor appetite with minimal abdominal discomfort.  Patient was referred to gastroenterologist.  Gastroenterologist did labs which found to be abnormal particularly mild elevation of bilirubin and was referred to the ER.  Patient states over the last 1 month patient has been eating very minimally history she only had a half a muffin.  She has lost at least 30 pounds weight the last few months.  ED Course: In the ER labs show markedly elevated alkaline phosphatase AST ALT and total bili of 39.3.  Hemoglobin 9.8.  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  Patient admitted for further work-up.  Review of Systems: As per HPI, rest all negative.   Past Medical History:  Diagnosis Date   Blood transfusion    Diabetes mellitus    Heart murmur    Hypertension     Past Surgical History:  Procedure Laterality Date   DILATION AND CURETTAGE OF UTERUS     NO PAST SURGERIES       reports that she has never smoked. She has never used smokeless tobacco. She reports that she does not drink alcohol and does not use drugs.  No Known Allergies  Family History  Problem Relation Age of Onset   Diabetes type II Mother    Diabetes type II Sister     Prior to Admission medications   Medication Sig Start Date End Date Taking? Authorizing Provider  acetaminophen (TYLENOL) 500 MG tablet Take 1,000 mg by mouth every 6 (six) hours as needed for moderate pain or headache.   Yes [provider]  amLODipine (NORVASC) 5 MG tablet  Take 5 mg by mouth daily. 10/17/21  Yes [provider]  aspirin 81 MG chewable tablet Chew 81 mg by mouth daily.     Yes [provider]  Cholecalciferol (QC VITAMIN D3) 50 MCG (2000 UT) TABS Take 2,000 Units by mouth daily.   Yes [provider]  glipiZIDE (GLUCOTROL) 5 MG tablet Take 2.5 mg by mouth 2 (two) times daily with a meal. 12/18/21  Yes [provider]  ibuprofen (ADVIL) 200 MG tablet Take 400 mg by mouth every 6 (six) hours as needed for moderate pain or headache.   Yes [provider]  KLOR-CON M20 20 MEQ tablet Take 20 mEq by mouth 2 (two) times daily. 01/14/22  Yes [provider]  losartan (COZAAR) 100 MG tablet Take 1 tablet (100 mg total) by mouth daily. 11/03/11 01/16/22 Yes Kathie Dike, MD    Physical Exam: Constitutional: Moderately built and poorly nourished. Vitals:   01/16/22 1810 01/16/22 2230  BP: 139/87 (!) 158/80  Pulse: 72 (!) 57  Resp: 18 15  Temp: 98.8 F (37.1 C)   TempSrc: Oral   SpO2: 100% 99%   Eyes: Icterus present no pallor. ENMT: No discharge from the ears eyes nose and mouth. Neck: No mass felt.  No neck rigidity. Respiratory: No rhonchi or crepitations. Cardiovascular: S1-S2 heard. Abdomen: Soft nontender bowel sound present. Musculoskeletal: No edema. Skin: Appears pale. Neurologic: Alert awake oriented time place and person.  Moves all extremities. Psychiatric: Appears normal.  Normal affect.   Labs on Admission: I have personally reviewed following labs and imaging studies  CBC: Recent Labs  Lab 01/16/22 1830  WBC 7.6  NEUTROABS 5.4  HGB 9.8*  HCT 28.3*  MCV 84.0  PLT 086   Basic Metabolic Panel: Recent Labs  Lab 01/16/22 1830  NA 133*  K 3.5  CL 103  CO2 21*  GLUCOSE 165*  BUN 19  CREATININE 0.80  CALCIUM 9.7   GFR: CrCl cannot be calculated (Unknown ideal weight.). Liver Function Tests: Recent Labs  Lab 01/16/22 1830  AST 269*  ALT 343*  ALKPHOS 959*   BILITOT 39.3*  PROT 6.8  ALBUMIN 2.7*   Recent Labs  Lab 01/16/22 2250  LIPASE 45   Recent Labs  Lab 01/16/22 2250  AMMONIA 46*   Coagulation Profile: Recent Labs  Lab 01/16/22 2250  INR 1.3*   Cardiac Enzymes: No results for input(s): CKTOTAL, CKMB, CKMBINDEX, TROPONINI in the last 168 hours. BNP (last 3 results) No results for input(s): PROBNP in the last 8760 hours. HbA1C: No results for input(s): HGBA1C in the last 72 hours. CBG: No results for input(s): GLUCAP in the last 168 hours. Lipid Profile: No results for input(s): CHOL, HDL, LDLCALC, TRIG, CHOLHDL, LDLDIRECT in the last 72 hours. Thyroid Function Tests: No results for input(s): TSH, T4TOTAL, FREET4, T3FREE, THYROIDAB in the last 72 hours. Anemia Panel: No results for input(s): VITAMINB12, FOLATE, FERRITIN, TIBC, IRON, RETICCTPCT in the last 72 hours. Urine analysis:    Component Value Date/Time   COLORURINE YELLOW 10/31/2018 1037   APPEARANCEUR CLOUDY (A) 10/31/2018 1037   LABSPEC 1.020 10/31/2018 1037   PHURINE 7.0 10/31/2018 1037   GLUCOSEU 250 (A) 10/31/2018 1037   HGBUR TRACE (A) 10/31/2018 1037   BILIRUBINUR NEGATIVE 10/31/2018 1037   KETONESUR >80 (A) 10/31/2018 1037   PROTEINUR 30 (A) 10/31/2018 1037   UROBILINOGEN 0.2 11/01/2011 2036   NITRITE NEGATIVE 10/31/2018 1037   LEUKOCYTESUR NEGATIVE 10/31/2018 1037   Sepsis Labs: '@LABRCNTIP'$ (procalcitonin:4,lacticidven:4) ) Recent Results (from the past 240 hour(s))  Resp Panel by RT-PCR (Flu A&B, Covid) Nasopharyngeal Swab     Status: None   Collection Time: 01/16/22 10:50 PM   Specimen: Nasopharyngeal Swab; Nasopharyngeal(NP) swabs in vial transport medium  Result Value Ref Range Status   SARS Coronavirus 2 by RT PCR NEGATIVE NEGATIVE Final    Comment: (NOTE) SARS-CoV-2 target nucleic acids are NOT DETECTED.  The SARS-CoV-2 RNA is generally detectable in upper respiratory specimens during the acute phase of infection. The  lowest concentration of SARS-CoV-2 viral copies this assay can detect is 138 copies/mL. A negative result does not preclude SARS-Cov-2 infection and should not be used as the sole basis for treatment or other patient management decisions. A negative result may occur with  improper specimen collection/handling, submission of specimen other than nasopharyngeal swab, presence of viral mutation(s) within the areas targeted by this assay, and inadequate number of viral copies(<138 copies/mL). A negative result must be combined with clinical observations, patient history, and epidemiological information. The expected result is Negative.  Fact Sheet for Patients:  EntrepreneurPulse.com.au  Fact Sheet for Healthcare Providers:  IncredibleEmployment.be  This test is no t yet approved or cleared by the [place] FDA and  has been authorized for detection and/or diagnosis of SARS-CoV-2 by FDA under an Emergency Use Authorization (EUA). This EUA will remain  in effect (meaning this test can be used) for the duration of the COVID-19  declaration under Section 564(b)(1) of the Act, 21 U.S.C.section 360bbb-3(b)(1), unless the authorization is terminated  or revoked sooner.       Influenza A by PCR NEGATIVE NEGATIVE Final   Influenza B by PCR NEGATIVE NEGATIVE Final    Comment: (NOTE) The Xpert Xpress SARS-CoV-2/FLU/RSV plus assay is intended as an aid in the diagnosis of influenza from Nasopharyngeal swab specimens and should not be used as a sole basis for treatment. Nasal washings and aspirates are unacceptable for Xpert Xpress SARS-CoV-2/FLU/RSV testing.  Fact Sheet for Patients: EntrepreneurPulse.com.au  Fact Sheet for Healthcare Providers: IncredibleEmployment.be  This test is not yet approved or cleared by the [place] FDA and has been authorized for detection and/or diagnosis of SARS-CoV-2 by FDA under  an Emergency Use Authorization (EUA). This EUA will remain in effect (meaning this test can be used) for the duration of the COVID-19 declaration under Section 564(b)(1) of the Act, 21 U.S.C. section 360bbb-3(b)(1), unless the authorization is terminated or revoked.  Performed at [facility], [place] [address], [place], [place] [address]      Radiological Exams on Admission: CT ABDOMEN PELVIS W CONTRAST  Result Date: 01/16/2022 CLINICAL DATA:  Jaundice EXAM: CT ABDOMEN AND PELVIS WITH CONTRAST TECHNIQUE: Multidetector CT imaging of the abdomen and pelvis was performed using the standard protocol following bolus administration of intravenous contrast. RADIATION DOSE REDUCTION: This exam was performed according to the departmental dose-optimization program which includes automated exposure control, adjustment of the mA and/or kV according to patient size and/or use of iterative reconstruction technique. CONTRAST:  180m OMNIPAQUE IOHEXOL 300 MG/ML  SOLN COMPARISON:  CT 10/31/2018 FINDINGS: Lower chest: Lung bases demonstrate no acute consolidation or effusion. Normal cardiac size. Hepatobiliary: Marked intra hepatic biliary dilatation. Dilated common bile duct measuring up to 16 mm. Distended gallbladder without stones. Pancreas: Atrophic body and tail with marked pancreatic ductal dilatation up to 13 mm. Ill-defined hypodense mass at the pancreatic head/neck region measuring approximately 3.9 by 2.6 cm, series 3, image 31, there is a coarse calcification. Spleen: Normal in size without focal abnormality. Adrenals/Urinary Tract: Adrenal glands are within normal limits. Kidneys show no hydronephrosis. The bladder is unremarkable Stomach/Bowel: The stomach is nonenlarged. No dilated small bowel. No acute bowel wall thickening. Negative appendix. Diverticular disease of the sigmoid colon. Vascular/Lymphatic: Nonaneurysmal aorta. No suspicious lymph nodes. Mild aortic atherosclerosis. Marked narrowing of  the SMV just before the portal vein confluence, by the ill-defined mass. Reproductive: No adnexal mass. Focal endometrial thickening up to 9 mm. Other: Negative for pelvic effusion or free air. Musculoskeletal: No acute osseous abnormality IMPRESSION: 1. Marked intra and extrahepatic biliary dilatation with marked dilatation of pancreatic duct and distended gallbladder. There is significant atrophy of the body and tail of pancreas. 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 2. Diverticular disease of the colon without acute wall thickening 3. Mild endometrial thickening up to 9 mm, recommend nonemergent pelvic ultrasound for further evaluation Electronically Signed   By: KDonavan FoilM.D.   On: 01/16/2022 23:38     Assessment/Plan Principal Problem:   Pancreatic mass Active Problems:   DM (diabetes mellitus) (HCC)   HTN (hypertension)   Jaundice    Jaundice with pancreatic mass concerning for malignancy for which MRCP with and without contrast has been ordered.  We will consult Dr. Mann/Dr. [place].  Patient was referred by them to  the ER.  Follow LFTs. Diabetes mellitus type 2 has not been eating well.  Closely follow CBGs check hemoglobin A1c. Hypertension on ARB and amlodipine. Chronic anemia hemoglobin appears to be at baseline.  Follow CBC. Endometrial thickening seen in the CT scan will need outpatient follow-up.  Since patient has markedly elevated LFTs with severe jaundice pancreatic mass will need close monitoring and inpatient status.   DVT prophylaxis: SCDs.  Avoiding anticoagulation due to worsening liver function and also may need procedure like stents for the jaundice. Code Status: Full code. Family Communication: Discussed with patient. Disposition Plan: To be determined. Consults called: Gastroenterology has been  notified. Admission status: Inpatient.   Rise Patience MD [facility] Pager [phone].  If 7PM-7AM, please contact night-coverage [email or web address] Password [email or web address]  01/17/2022, 1:17 AM

## 2022-01-17 NOTE — Assessment & Plan Note (Addendum)
Currently normotensive on amlodipine. ?

## 2022-01-17 NOTE — Progress Notes (Signed)
PROGRESS NOTE    Miranda Francis  GBT:[id number] DOB: 03-06-1946 DOA: 01/16/2022 PCP: [name], MD    Brief Narrative:  Miranda Francis is a 76 year old female with past medical history significant for essential hypertension, type 2 diabetes mellitus who presented to [facility] ED on 3/7 for progressive skin changes, poor appetite and weight loss.  Patient was recently referred to a gastroenterologist with recent labs concerning for elevated transaminases and bilirubin and was referred to the ED for further evaluation.  Patient reports over the last month she has been eating very minimally, lost roughly 30 pounds.  In the ED, temperature 98.8 F, HR 72, RR 18, BP 139/87, SPO2 100% on room air.  WBC 7.6, hemoglobin 9.8, platelets 202.  Sodium 133, potassium 3.5, chloride 103, CO2 21, glucose 165.  BUN 19, creatinine 0.80.  Alkaline phosphatase 959, lipase 45, AST 269, ALT 343, total bilirubin 39.3.  Ammonia level 46.  INR 1.3.  Tylenol level less than 10.  COVID-19 PCR negative.  Influenza A/B PCR negative.  Urinalysis unrevealing.  CT abdomen/pelvis with marked intra and extrahepatic biliary dilation with marked dilation of the pancreatic duct and distended gallbladder, significant atrophy of the body and tail of the pancreas, ill-defined soft tissue mass at the pancreatic head/neck region with marked narrowing of the SMV by ill-defined mass, concerning for neoplasm/pancreatic carcinoma.  EDP consulted [facility] for further evaluation and management of biliary obstruction likely secondary to neoplasm.    Assessment & Plan:   Assessment and Plan: * Pancreatic mass Patient presenting to ED with progressive weight loss, 30 pounds, poor appetite and skin discoloration over the last month.  Was referred to outpatient gastroenterology and had labs performed and due to lab abnormality was sent to the ED for further evaluation.  Significant LFT, bilirubin and alk phos elevation on admission.  CT abdomen/pelvis  with marked intra and extrahepatic biliary dilation with marked dilation of the pancreatic duct and distended gallbladder, significant atrophy of the body and tail of the pancreas, ill-defined soft tissue mass at the pancreatic head/neck region with marked narrowing of the SMV by ill-defined mass, concerning for neoplasm/pancreatic carcinoma. --Gastroenterology following, Dr. Collene Mares; appreciate assistance --CA 19-9: Pending --MRCP: Pending --GI plans ERCP in the a.m.; n.p.o. after midnight --CMP daily --Patient wishes not to disclose any medical information to her family until has full understanding regarding pancreatic mass  DM (diabetes mellitus) ([place]) Hemoglobin A1c 4.6, well controlled.  No twice daily at home. --Holding hypoglycemic agents while inpatient --SSI for coverage --CBGs qAC/HS  HTN (hypertension) --Amlodipine 5 mg p.o. daily --Losartan 174m PO daily  Jaundice --Continue to trend CMP    DVT prophylaxis: SCDs Start: 01/17/22 0117    Code Status: Full Code Family Communication: No family present at bedside, patient wishes not to disclose any medical information at this time until further work-up completed  Disposition Plan:  Level of care: Telemetry Medical Status is: Inpatient Remains inpatient appropriate because: MRCP: Pending, ERCP planned for tomorrow by Dr. HBenson [place]   Consultants:  Gastroenterology, Dr. MCollene Mares Dr. HBenson Norway Procedures:  MRCP: Pending ERCP planned for 3/9  Antimicrobials:  None   Subjective: Patient seen and examined at bedside, resting comfortably.  No specific complaints this morning.  No family present.  Patient reports 30 pound weight loss over the last 1-2 months, progressive yellowing of her skin, gray-colored stools and poor appetite.  Discussed findings on CT, concerning for pancreatic neoplasm.  Patient adamant about not having any disclosure to family members  at this time until further work-up completed.  Pending MRCP.  Seen by GI with  plan ERCP tomorrow.  No other complaints or concerns at this time.  Denies headache, no visual changes, no chest pain, no palpitations, no shortness of breath, no abdominal pain currently, no nausea/vomiting/diarrhea, no fever/chills/night sweats, no paresthesias.  No acute events overnight per nursing staff.  Objective: Vitals:   01/16/22 1810 01/16/22 2230 01/17/22 0404 01/17/22 0821  BP: 139/87 (!) 158/80 (!) 146/78 112/77  Pulse: 72 (!) 57 (!) 57 60  Resp: _0 Temp: 98.8 F (37.1 C)  98.2 F (36.8 C) 98 F (36.7 C)  TempSrc: Oral  Oral Oral  SpO2: 100% 99% 100% 99%  Weight:   72.5 kg   Height:   _1  (1.6 m)    No intake or output data in the 24 hours ending 01/17/22 1255 Filed Weights   01/17/22 0404  Weight: 72.5 kg    Examination:  Physical Exam: GEN: NAD, alert and oriented x 3, ill/jaundiced in appearance HEENT: NCAT, PERRL, EOMI, scleral icterus noted, MMM PULM: CTAB w/o wheezes/crackles, normal respiratory effort, on room air CV: RRR w/o M/G/R GI: abd soft, NTND, NABS, no R/G/M MSK: no peripheral edema, muscle strength globally intact 5/5 bilateral upper/lower extremities NEURO: CN II-XII intact, no focal deficits, sensation to light touch intact PSYCH: normal mood/affect Integumentary: Skin notably jaundiced, otherwise no concerning rashes/lesions/wounds.     Data Reviewed: I have personally reviewed following labs and imaging studies  CBC: Recent Labs  Lab 01/16/22 1830  WBC 7.6  NEUTROABS 5.4  HGB 9.8*  HCT 28.3*  MCV 84.0  PLT 703   Basic Metabolic Panel: Recent Labs  Lab 01/16/22 1830  NA 133*  K 3.5  CL 103  CO2 21*  GLUCOSE 165*  BUN 19  CREATININE 0.80  CALCIUM 9.7   GFR: Estimated Creatinine Clearance: 58.8 mL/min (by C-G formula based on SCr of 0.8 mg/dL). Liver Function Tests: Recent Labs  Lab 01/16/22 1830  AST 269*  ALT 343*  ALKPHOS 959*  BILITOT 39.3*  PROT 6.8  ALBUMIN 2.7*   Recent Labs  Lab  01/16/22 2250  LIPASE 45   Recent Labs  Lab 01/16/22 2250  AMMONIA 46*   Coagulation Profile: Recent Labs  Lab 01/16/22 2250  INR 1.3*   Cardiac Enzymes: No results for input(s): CKTOTAL, CKMB, CKMBINDEX, TROPONINI in the last 168 hours. BNP (last 3 results) No results for input(s): PROBNP in the last 8760 hours. HbA1C: Recent Labs    01/17/22 0118  HGBA1C 4.6*   CBG: Recent Labs  Lab 01/17/22 0824 01/17/22 1153  GLUCAP 121* 113*   Lipid Profile: No results for input(s): CHOL, HDL, LDLCALC, TRIG, CHOLHDL, LDLDIRECT in the last 72 hours. Thyroid Function Tests: No results for input(s): TSH, T4TOTAL, FREET4, T3FREE, THYROIDAB in the last 72 hours. Anemia Panel: No results for input(s): VITAMINB12, FOLATE, FERRITIN, TIBC, IRON, RETICCTPCT in the last 72 hours. Sepsis Labs: No results for input(s): PROCALCITON, LATICACIDVEN in the last 168 hours.  Recent Results (from the past 240 hour(s))  Resp Panel by RT-PCR (Flu A&B, Covid) Nasopharyngeal Swab     Status: None   Collection Time: 01/16/22 10:50 PM   Specimen: Nasopharyngeal Swab; Nasopharyngeal(NP) swabs in vial transport medium  Result Value Ref Range Status   SARS Coronavirus 2 by RT PCR NEGATIVE NEGATIVE Final    Comment: (NOTE) SARS-CoV-2 target nucleic acids are NOT DETECTED.  The SARS-CoV-2 RNA is  generally detectable in upper respiratory specimens during the acute phase of infection. The lowest concentration of SARS-CoV-2 viral copies this assay can detect is 138 copies/mL. A negative result does not preclude SARS-Cov-2 infection and should not be used as the sole basis for treatment or other patient management decisions. A negative result may occur with  improper specimen collection/handling, submission of specimen other than nasopharyngeal swab, presence of viral mutation(s) within the areas targeted by this assay, and inadequate number of viral copies(<138 copies/mL). A negative result must be  combined with clinical observations, patient history, and epidemiological information. The expected result is Negative.  Fact Sheet for Patients:  EntrepreneurPulse.com.au  Fact Sheet for Healthcare Providers:  IncredibleEmployment.be  This test is no t yet approved or cleared by the [place] FDA and  has been authorized for detection and/or diagnosis of SARS-CoV-2 by FDA under an Emergency Use Authorization (EUA). This EUA will remain  in effect (meaning this test can be used) for the duration of the COVID-19 declaration under Section 564(b)(1) of the Act, 21 U.S.C.section 360bbb-3(b)(1), unless the authorization is terminated  or revoked sooner.       Influenza A by PCR NEGATIVE NEGATIVE Final   Influenza B by PCR NEGATIVE NEGATIVE Final    Comment: (NOTE) The Xpert Xpress SARS-CoV-2/FLU/RSV plus assay is intended as an aid in the diagnosis of influenza from Nasopharyngeal swab specimens and should not be used as a sole basis for treatment. Nasal washings and aspirates are unacceptable for Xpert Xpress SARS-CoV-2/FLU/RSV testing.  Fact Sheet for Patients: EntrepreneurPulse.com.au  Fact Sheet for Healthcare Providers: IncredibleEmployment.be  This test is not yet approved or cleared by the [place] FDA and has been authorized for detection and/or diagnosis of SARS-CoV-2 by FDA under an Emergency Use Authorization (EUA). This EUA will remain in effect (meaning this test can be used) for the duration of the COVID-19 declaration under Section 564(b)(1) of the Act, 21 U.S.C. section 360bbb-3(b)(1), unless the authorization is terminated or revoked.  Performed at [facility], [place] [address] [place], [place] [address]          Radiology Studies: CT ABDOMEN PELVIS W CONTRAST  Result Date: 01/16/2022 CLINICAL DATA:  Jaundice EXAM: CT ABDOMEN AND PELVIS WITH CONTRAST TECHNIQUE:  Multidetector CT imaging of the abdomen and pelvis was performed using the standard protocol following bolus administration of intravenous contrast. RADIATION DOSE REDUCTION: This exam was performed according to the departmental dose-optimization program which includes automated exposure control, adjustment of the mA and/or kV according to patient size and/or use of iterative reconstruction technique. CONTRAST:  144m OMNIPAQUE IOHEXOL 300 MG/ML  SOLN COMPARISON:  CT 10/31/2018 FINDINGS: Lower chest: Lung bases demonstrate no acute consolidation or effusion. Normal cardiac size. Hepatobiliary: Marked intra hepatic biliary dilatation. Dilated common bile duct measuring up to 16 mm. Distended gallbladder without stones. Pancreas: Atrophic body and tail with marked pancreatic ductal dilatation up to 13 mm. Ill-defined hypodense mass at the pancreatic head/neck region measuring approximately 3.9 by 2.6 cm, series 3, image 31, there is a coarse calcification. Spleen: Normal in size without focal abnormality. Adrenals/Urinary Tract: Adrenal glands are within normal limits. Kidneys show no hydronephrosis. The bladder is unremarkable Stomach/Bowel: The stomach is nonenlarged. No dilated small bowel. No acute bowel wall thickening. Negative appendix. Diverticular disease of the sigmoid colon. Vascular/Lymphatic: Nonaneurysmal aorta. No suspicious lymph nodes. Mild aortic atherosclerosis. Marked narrowing of the SMV just before the portal vein confluence, by the ill-defined mass. Reproductive: No adnexal mass.  Focal endometrial thickening up to 9 mm. Other: Negative for pelvic effusion or free air. Musculoskeletal: No acute osseous abnormality IMPRESSION: 1. Marked intra and extrahepatic biliary dilatation with marked dilatation of pancreatic duct and distended gallbladder. There is significant atrophy of the body and tail of pancreas. Ill-defined soft tissue mass at the pancreatic head neck region with marked narrowing of  the SMV by ill-defined mass, constellation of findings concerning for neoplasm/pancreas carcinoma. 2. Diverticular disease of the colon without acute wall thickening 3. Mild endometrial thickening up to 9 mm, recommend nonemergent pelvic ultrasound for further evaluation Electronically Signed   By: Donavan Foil M.D.   On: 01/16/2022 23:38        Scheduled Meds:  amLODipine  5 mg Oral Daily   feeding supplement  237 mL Oral BID BM   insulin aspart  0-6 Units Subcutaneous TID WC   losartan  100 mg Oral Daily   Continuous Infusions:   LOS: 0 days    Time spent: 52 minutes spent on chart review, discussion with nursing staff, consultants, updating family and interview/physical exam; more than 50% of that time was spent in counseling and/or coordination of care.    Rickey Sadowski J [place] DO [facility] Available via Epic secure chat 7am-7pm After these hours, please refer to coverage provider listed on [email or web address] 01/17/2022, 12:55 PM

## 2022-01-17 NOTE — Assessment & Plan Note (Addendum)
Patient presented with progressive weight loss 30 pounds+, poor appetite and skin discoloration over last 1 month.  She was referred to outpatient gastroenterology and has labs performed and due to abnormality she was sent in the ED for further evaluation. Significant LFT, bilirubin and alk phos elevation on admission.   CT abdomen/pelvis with marked intra and extrahepatic biliary dilation with marked dilation of the pancreatic duct and distended gallbladder,  ill-defined soft tissue mass at the pancreatic head/neck region  concerning for neoplasm/pancreatic carcinoma. Gastroenterology consulted.  Patient underwent ERCP. She was found to have a mass in the pancreatic head.  Biopsy done/Result pending.

## 2022-01-17 NOTE — Progress Notes (Signed)
Initial Nutrition Assessment ? ?DOCUMENTATION CODES:  ? ?Non-severe (moderate) malnutrition in context of chronic illness ? ?INTERVENTION:  ?Liberalize diet to regular  ?Ensure Enlive po TID, each supplement provides 350 kcal and 20 grams of protein. ?MVI with minerals daily ?Encourage adequate PO intake  ? ? ?NUTRITION DIAGNOSIS:  ? ?Moderate Malnutrition related to chronic illness (suspected pancreatic cancer) as evidenced by mild fat depletion, mild muscle depletion. ? ? ?GOAL:  ? ?Patient will meet greater than or equal to 90% of their needs ? ? ?MONITOR:  ? ?PO intake, Supplement acceptance, Diet advancement, Weight trends, Labs, Skin ? ?REASON FOR ASSESSMENT:  ? ?Malnutrition Screening Tool ?  ? ?ASSESSMENT:  ? ?Pt is a 76 year old female with past medical history significant of HTN, DM type II, heart murmur who was referred by GI doctor to present to the hospital due to abnormal labs and pt complaining of poor appetite with some abdominal discomfort and more yellow in color over the last one month. CT abdomen was performed and is concerning for pancreatic mass concerning for pancreatic malignancy and pt admitted for further evaluation. ?  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 ?Pt is currently NPO.  ? ?Met with pt at bedside. Pt's husband present during visit. Pt denies any nausea or vomiting at this time, but reports not having a appetite. Per pt, pt reports decreased appetite for the past 1 month. Prior to admission, pt states that she would try to eat something and experience early satiety after just a few small bits and reports that she couldn't taste foods and that the smell of foods were unpleasant for her. Pt unable to provide usual body weight but suspects she has lost ~10# within the last one month. Per pt,  the last meal that she ate was yesterday at lunch time. She reports that she ate 100% of chicken salad sandwich and orange sherbet and tolerated well. Dietetic intern observed an Ensure in pt's room away from pt's table. Discussed with pt oral nutrition supplementation to aid in caloric and protein intake and pt agreeable to Ensure (chocolate-flavored) at this time. Discussed MVI supplementation with pt and pt agreeable to taking MVI. Encouraged pt to try to take small bits of food and to consume what she can tolerate. ? ?Current wt: 72.5 kg  ? ?Labs reviewed and include:  ?CBG's: 103-179, A1C: 4.6, Total Bilirubin: 34.8, Creatinine: 1.01, Hemoglobin: 9.8  ?Lab Results  ?Component Value Date  ? ALT 297 (H) 01/18/2022  ? AST 238 (H) 01/18/2022  ? ALKPHOS 873 (H) 01/18/2022  ? BILITOT 34.8 (HH) 01/18/2022  ? ?Medications reviewed and include:  ? Ensure enlive feeding supplement  237 mL Oral BID BM  ? insulin aspart  0-6 Units Subcutaneous TID WC  ? ? ?NUTRITION - FOCUSED PHYSICAL EXAM: ?Flowsheet Row Most Recent Value  ?Orbital Region Mild depletion  ?Upper Arm Region No depletion  ?Thoracic and Lumbar Region No depletion  ?Buccal Region Mild depletion  ?Temple Region No depletion  ?Clavicle Bone Region Mild depletion  ?Clavicle and Acromion Bone Region No depletion  ?Scapular Bone Region No depletion  ?Dorsal Hand Mild depletion  ?Patellar Region Mild depletion  ?Anterior Thigh Region Mild depletion  ?Posterior Calf Region Moderate depletion  ?Edema (RD Assessment) None  ?Hair Reviewed  ?Eyes  Reviewed  ?Mouth Reviewed  ?Skin Reviewed  [yellowish appearance]  ?Nails Reviewed  ? ?  ? ? ? ? ?Diet Order:   ?Diet Order   ? ?       ?  Diet regular Room service appropriate? Yes; Fluid consistency: Thin  Diet effective now       ?  ? ?  ?  ? ?  ? ? ?EDUCATION NEEDS:  ? ?Education needs have been addressed ? ?Skin:  Skin Assessment: Reviewed RN Assessment ? ?Last BM:  3/8 ? ?Height:  ? ?Ht Readings from Last 1 Encounters:   ?01/17/22 $RemoveBe'5\' 3"'[id number]$  (1.6 m)  ? ? ?Weight:  ? ?Wt Readings from Last 1 Encounters:  ?01/17/22 72.5 kg  ? ? ?Ideal Body Weight:  52.2 kg ? ?BMI:  Body mass index is 28.31 kg/m?. ? ?Estimated Nutritional Needs:  ? ?Kcal:  1800 - 2000 ? ?Protein:  90 - 105 grams ? ?Fluid:  >/= 1.8 L ? ? ? ?Maryruth Hancock, Dietetic Intern ?01/18/2022 3:35 PM ?

## 2022-01-17 NOTE — Hospital Course (Addendum)
Miranda Francis is a 76 year old female with PMH significant for essential hypertension, type 2 diabetes mellitus who presented to [facility] ED on 3/7 for progressive skin changes, poor appetite and weight loss.  Patient was recently referred to  gastroenterologist with recent labs concerning for elevated transaminases and bilirubin and was referred to the ED for further evaluation.  Patient reports over the last month she has been eating very minimally, lost roughly 30 pounds. In the ED,  Labs includes Alkaline phosphatase 959, lipase 45, AST 269, ALT 343, total bilirubin 39.3.  Ammonia level 46.  INR 1.3.  Tylenol level less than 10.  COVID-19 PCR negative.  Urinalysis unrevealing.  CT abdomen/pelvis with marked intra and extrahepatic biliary dilation with marked dilation of the pancreatic duct and distended gallbladder,  ill-defined soft tissue mass at the pancreatic head/neck region concerning for neoplasm/pancreatic carcinoma. GI was consulted , underwent ERCP found to have a mass in the pancreatic head, fine-needle aspiration completed. Post ERCP lipase was 1600, consistent with post ERCP pancreatitis.  GI following

## 2022-01-17 NOTE — Assessment & Plan Note (Addendum)
HbA1c 4.6.  Well-controlled ?Diet controlled.  Currently on sliding scale insulin ?

## 2022-01-18 ENCOUNTER — Inpatient Hospital Stay (HOSPITAL_COMMUNITY): Payer: Medicare HMO | Admitting: Anesthesiology

## 2022-01-18 ENCOUNTER — Inpatient Hospital Stay (HOSPITAL_COMMUNITY): Payer: Medicare HMO

## 2022-01-18 ENCOUNTER — Encounter (HOSPITAL_COMMUNITY)
Admission: EM | Disposition: A | Discharge status: Home / Self Care | Payer: Self-pay | Source: Home / Self Care | Attending: Internal Medicine

## 2022-01-18 ENCOUNTER — Encounter (HOSPITAL_COMMUNITY): Payer: Self-pay | Admitting: Internal Medicine

## 2022-01-18 DIAGNOSIS — K8689 Other specified diseases of pancreas: Secondary | ICD-10-CM

## 2022-01-18 DIAGNOSIS — E440 Moderate protein-calorie malnutrition: Secondary | ICD-10-CM | POA: Insufficient documentation

## 2022-01-18 DIAGNOSIS — K831 Obstruction of bile duct: Secondary | ICD-10-CM

## 2022-01-18 HISTORY — PX: ESOPHAGOGASTRODUODENOSCOPY: SHX5428

## 2022-01-18 HISTORY — PX: ERCP: SHX5425

## 2022-01-18 HISTORY — PX: SPHINCTEROTOMY: SHX5544

## 2022-01-18 HISTORY — PX: FINE NEEDLE ASPIRATION: SHX5430

## 2022-01-18 HISTORY — PX: BILIARY STENT PLACEMENT: SHX5538

## 2022-01-18 HISTORY — PX: EUS: SHX5427

## 2022-01-18 LAB — COMPREHENSIVE METABOLIC PANEL
ALT: 297 U/L — ABNORMAL HIGH (ref 0–44)
AST: 238 U/L — ABNORMAL HIGH (ref 15–41)
Albumin: 2.3 g/dL — ABNORMAL LOW (ref 3.5–5.0)
Alkaline Phosphatase: 873 U/L — ABNORMAL HIGH (ref 38–126)
Anion gap: 9 (ref 5–15)
BUN: 18 mg/dL (ref 8–23)
CO2: 19 mmol/L — ABNORMAL LOW (ref 22–32)
Calcium: 9.1 mg/dL (ref 8.9–10.3)
Chloride: 107 mmol/L (ref 98–111)
Creatinine, Ser: 1.01 mg/dL — ABNORMAL HIGH (ref 0.44–1.00)
GFR, Estimated: 58 mL/min — ABNORMAL LOW (ref >60)
Glucose, Bld: 131 mg/dL — ABNORMAL HIGH (ref 70–99)
Potassium: 3.6 mmol/L (ref 3.5–5.1)
Sodium: 135 mmol/L (ref 135–145)
Total Bilirubin: 34.8 mg/dL (ref 0.3–1.2)
Total Protein: 6 g/dL — ABNORMAL LOW (ref 6.5–8.1)

## 2022-01-18 LAB — GLUCOSE, CAPILLARY
Glucose-Capillary: 131 mg/dL — ABNORMAL HIGH (ref 70–99)
Glucose-Capillary: 146 mg/dL — ABNORMAL HIGH (ref 70–99)
Glucose-Capillary: 164 mg/dL — ABNORMAL HIGH (ref 70–99)
Glucose-Capillary: 218 mg/dL — ABNORMAL HIGH (ref 70–99)

## 2022-01-18 SURGERY — ERCP, WITH INTERVENTION IF INDICATED
Anesthesia: General

## 2022-01-18 MED ORDER — LACTATED RINGERS IV SOLN: INTRAVENOUS | Status: DC | PRN

## 2022-01-18 MED ORDER — INDOMETHACIN 50 MG RE SUPP
RECTAL | Status: AC
  Filled 2022-01-18: qty 2

## 2022-01-18 MED ORDER — EPHEDRINE SULFATE-NACL 50-0.9 MG/10ML-% IV SOSY
PREFILLED_SYRINGE | INTRAVENOUS | Status: DC | PRN
  Administered 2022-01-18 (×3): 5 mg via INTRAVENOUS

## 2022-01-18 MED ORDER — DEXAMETHASONE SODIUM PHOSPHATE 10 MG/ML IJ SOLN
INTRAMUSCULAR | Status: DC | PRN
  Administered 2022-01-18: 5 mg via INTRAVENOUS

## 2022-01-18 MED ORDER — ADULT MULTIVITAMIN W/MINERALS CH
1.0000 | ORAL_TABLET | Freq: Every day | ORAL | Status: DC
  Administered 2022-01-18 – 2022-01-25 (×8): 1 via ORAL
  Filled 2022-01-18 (×8): qty 1

## 2022-01-18 MED ORDER — GLUCAGON HCL RDNA (DIAGNOSTIC) 1 MG IJ SOLR
INTRAMUSCULAR | Status: AC
  Filled 2022-01-18: qty 1

## 2022-01-18 MED ORDER — OXYCODONE HCL 5 MG PO TABS
5.0000 mg | ORAL_TABLET | Freq: Four times a day (QID) | ORAL | Status: DC | PRN
  Administered 2022-01-18 – 2022-01-19 (×3): 5 mg via ORAL
  Filled 2022-01-18 (×3): qty 1

## 2022-01-18 MED ORDER — PHENYLEPHRINE HCL-NACL 20-0.9 MG/250ML-% IV SOLN
INTRAVENOUS | Status: DC | PRN
  Administered 2022-01-18: 40 ug/min via INTRAVENOUS

## 2022-01-18 MED ORDER — ENSURE ENLIVE PO LIQD
237.0000 mL | Freq: Three times a day (TID) | ORAL | Status: DC
  Administered 2022-01-18: 22:00:00 237 mL via ORAL

## 2022-01-18 MED ORDER — SODIUM CHLORIDE 0.9 % IV SOLN
INTRAVENOUS | Status: DC | PRN
  Administered 2022-01-18: 11:00:00 16 mL

## 2022-01-18 MED ORDER — ONDANSETRON HCL 4 MG/2ML IJ SOLN
INTRAMUSCULAR | Status: DC | PRN
  Administered 2022-01-18: 4 mg via INTRAVENOUS

## 2022-01-18 MED ORDER — FENTANYL CITRATE (PF) 100 MCG/2ML IJ SOLN
INTRAMUSCULAR | Status: DC | PRN
  Administered 2022-01-18 (×2): 50 ug via INTRAVENOUS

## 2022-01-18 MED ORDER — PHENYLEPHRINE 40 MCG/ML (10ML) SYRINGE FOR IV PUSH (FOR BLOOD PRESSURE SUPPORT)
PREFILLED_SYRINGE | INTRAVENOUS | Status: DC | PRN
Start: 2022-01-18 — End: 2022-01-18
  Administered 2022-01-18: 160 ug via INTRAVENOUS

## 2022-01-18 MED ORDER — CIPROFLOXACIN IN D5W 400 MG/200ML IV SOLN
INTRAVENOUS | Status: AC
  Filled 2022-01-18: qty 200

## 2022-01-18 MED ORDER — ROCURONIUM BROMIDE 10 MG/ML (PF) SYRINGE
PREFILLED_SYRINGE | INTRAVENOUS | Status: DC | PRN
Start: 2022-01-18 — End: 2022-01-18
  Administered 2022-01-18: 10 mg via INTRAVENOUS
  Administered 2022-01-18: 70 mg via INTRAVENOUS

## 2022-01-18 MED ORDER — PROPOFOL 10 MG/ML IV BOLUS
INTRAVENOUS | Status: DC | PRN
  Administered 2022-01-18: 100 mg via INTRAVENOUS

## 2022-01-18 MED ORDER — ONDANSETRON HCL 4 MG/2ML IJ SOLN
4.0000 mg | Freq: Four times a day (QID) | INTRAMUSCULAR | Status: DC | PRN
  Administered 2022-01-18: 4 mg via INTRAVENOUS
  Filled 2022-01-18: qty 2

## 2022-01-18 MED ORDER — SUGAMMADEX SODIUM 200 MG/2ML IV SOLN
INTRAVENOUS | Status: DC | PRN
Start: 2022-01-18 — End: 2022-01-18
  Administered 2022-01-18: 200 mg via INTRAVENOUS

## 2022-01-18 MED ORDER — SODIUM CHLORIDE 0.9 % IV SOLN: INTRAVENOUS | Status: DC

## 2022-01-18 MED ORDER — LIDOCAINE 2% (20 MG/ML) 5 ML SYRINGE
INTRAMUSCULAR | Status: DC | PRN
Start: 2022-01-18 — End: 2022-01-18
  Administered 2022-01-18: 60 mg via INTRAVENOUS

## 2022-01-18 MED ORDER — CIPROFLOXACIN IN D5W 400 MG/200ML IV SOLN
INTRAVENOUS | Status: DC | PRN
  Administered 2022-01-18: 400 mg via INTRAVENOUS

## 2022-01-18 NOTE — Plan of Care (Signed)

## 2022-01-18 NOTE — Progress Notes (Signed)
PROGRESS NOTE    Miranda Francis  YUU:[id number] DOB: August 30, 1946 DOA: 01/16/2022 PCP: Karle Plumber, MD    Brief Narrative:  Miranda Francis is a 76 year old female with PMH significant for essential hypertension, type 2 diabetes mellitus who presented to [facility] ED on 3/7 for progressive skin changes, poor appetite and weight loss.  Patient was recently referred to  gastroenterologist with recent labs concerning for elevated transaminases and bilirubin and was referred to the ED for further evaluation.  Patient reports over the last month she has been eating very minimally, lost roughly 30 pounds.  In the ED,  Labs includes Alkaline phosphatase 959, lipase 45, AST 269, ALT 343, total bilirubin 39.3.  Ammonia level 46.  INR 1.3.  Tylenol level less than 10.  COVID-19 PCR negative.  Influenza A/B PCR negative.  Urinalysis unrevealing.  CT abdomen/pelvis with marked intra and extrahepatic biliary dilation with marked dilation of the pancreatic duct and distended gallbladder, significant atrophy of the body and tail of the pancreas, ill-defined soft tissue mass at the pancreatic head/neck region with marked narrowing of the SMV by ill-defined mass, concerning for neoplasm/pancreatic carcinoma.  EDP consulted [facility] for further evaluation and management of biliary obstruction likely secondary to neoplasm.  GI was consulted patient underwent ERCP found to have a mass in the pancreatic head, fine-needle aspiration completed.     Assessment and Plan: * Pancreatic mass Patient presented with progressive weight loss 30 pounds+, poor appetite and skin discoloration over last 1 month.  She was referred to outpatient gastroenterology and has labs performed and due to abnormality she was sent in the ED for further evaluation.  Significant LFT, bilirubin and alk phos elevation on admission.   CT abdomen/pelvis with marked intra and extrahepatic biliary dilation with marked dilation of the pancreatic duct and  distended gallbladder, significant atrophy of the body and tail of the pancreas, ill-defined soft tissue mass at the pancreatic head/neck region with marked narrowing of the SMV by ill-defined mass, concerning for neoplasm/pancreatic carcinoma. Gastroenterology consulted.  Patient underwent ERCP. She is found to have a mass in the pancreatic head.  DM (diabetes mellitus) (HCC) HbA1c 4.6.  Well-controlled Diet controlled  HTN (hypertension) Continue amlodipine and losartan.  Jaundice Continue to trend LFTs.    DVT prophylaxis: SCDs Start: 01/17/22 0117    Code Status: Full Code Family Communication: Family present at bedside. Disposition Plan:  Level of care: Telemetry Medical Status is: Inpatient Remains inpatient appropriate because: Underwent ERCP found to have a pancreatic mass.  Further plan as per GI.  Consultants:  Gastroenterology, Dr. Loreta Ave, Dr. Elnoria Howard  Procedures:  MRCP: Pending ERCP planned for 3/9  Antimicrobials:  None   Subjective: Patient was seen and examined at bedside.  Overnight events noted. She is s/p ERCP found to have a pancreatic mass.  Patient reports having poor appetite.  Objective: Vitals:   01/18/22 1220 01/18/22 1235 01/18/22 1250 01/18/22 1306  BP: (!) 141/78 126/81 134/83 (!) 149/78  Pulse: 75 64 (!) 57 (!) 55  Resp: 18 17 13    Temp: 97.7 F (36.5 C)  97.6 F (36.4 C) (!) 97.4 F (36.3 C)  TempSrc:    Oral  SpO2: 100% 100% 100% 100%  Weight:      Height:        Intake/Output Summary (Last 24 hours) at 01/18/2022 1546 Last data filed at 01/18/2022 1214 Gross per 24 hour  Intake 1000 ml  Output 0 ml  Net 1000 ml   03/20/2022  01/17/22 0404  Weight: 72.5 kg    Examination:  Physical Exam: GEN: Appears comfortable, not in any acute distress, appears jaundiced. HEENT: PERRL, EOMI, sclera icterus. PULM: CTA bilaterally, no wheezing, no crackles, normal respiratory effort. CV: S1-S2 heard, regular rate and rhythm, no  murmur. GI: Abdomen is soft, non tender, non distended, BS+ MSK: No edema, no cyanosis, no clubbing. NEURO: CN II-XII intact, no focal deficits, sensation to light touch intact PSYCH: normal mood/affect Skin: Skin notably jaundiced, otherwise no concerning rashes/lesions/wounds.     Data Reviewed: I have personally reviewed following labs and imaging studies  CBC: Recent Labs  Lab 01/16/22 1830  WBC 7.6  NEUTROABS 5.4  HGB 9.8*  HCT 28.3*  MCV 84.0  PLT 176   Basic Metabolic Panel: Recent Labs  Lab 01/16/22 1830 01/18/22 0050  NA 133* 135  K 3.5 3.6  CL 103 107  CO2 21* 19*  GLUCOSE 165* 131*  BUN 19 18  CREATININE 0.80 1.01*  CALCIUM 9.7 9.1   GFR: Estimated Creatinine Clearance: 46.6 mL/min (A) (by C-G formula based on SCr of 1.01 mg/dL (H)). Liver Function Tests: Recent Labs  Lab 01/16/22 1830 01/18/22 0050  AST 269* 238*  ALT 343* 297*  ALKPHOS 959* 873*  BILITOT 39.3* 34.8*  PROT 6.8 6.0*  ALBUMIN 2.7* 2.3*   Recent Labs  Lab 01/16/22 2250  LIPASE 45   Recent Labs  Lab 01/16/22 2250  AMMONIA 46*   Coagulation Profile: Recent Labs  Lab 01/16/22 2250  INR 1.3*   Cardiac Enzymes: No results for input(s): CKTOTAL, CKMB, CKMBINDEX, TROPONINI in the last 168 hours. BNP (last 3 results) No results for input(s): PROBNP in the last 8760 hours. HbA1C: Recent Labs    01/17/22 0118  HGBA1C 4.6*   CBG: Recent Labs  Lab 01/17/22 1153 01/17/22 1638 01/17/22 1914 01/18/22 0737 01/18/22 1228  GLUCAP 113* 179* 103* 131* 146*   Lipid Profile: No results for input(s): CHOL, HDL, LDLCALC, TRIG, CHOLHDL, LDLDIRECT in the last 72 hours. Thyroid Function Tests: No results for input(s): TSH, T4TOTAL, FREET4, T3FREE, THYROIDAB in the last 72 hours. Anemia Panel: No results for input(s): VITAMINB12, FOLATE, FERRITIN, TIBC, IRON, RETICCTPCT in the last 72 hours. Sepsis Labs: No results for input(s): PROCALCITON, LATICACIDVEN in the last 168  hours.  Recent Results (from the past 240 hour(s))  Resp Panel by RT-PCR (Flu A&B, Covid) Nasopharyngeal Swab     Status: None   Collection Time: 01/16/22 10:50 PM   Specimen: Nasopharyngeal Swab; Nasopharyngeal(NP) swabs in vial transport medium  Result Value Ref Range Status   SARS Coronavirus 2 by RT PCR NEGATIVE NEGATIVE Final    Comment: (NOTE) SARS-CoV-2 target nucleic acids are NOT DETECTED.  The SARS-CoV-2 RNA is generally detectable in upper respiratory specimens during the acute phase of infection. The lowest concentration of SARS-CoV-2 viral copies this assay can detect is 138 copies/mL. A negative result does not preclude SARS-Cov-2 infection and should not be used as the sole basis for treatment or other patient management decisions. A negative result may occur with  improper specimen collection/handling, submission of specimen other than nasopharyngeal swab, presence of viral mutation(s) within the areas targeted by this assay, and inadequate number of viral copies(<138 copies/mL). A negative result must be combined with clinical observations, patient history, and epidemiological information. The expected result is Negative.  Fact Sheet for Patients:  EntrepreneurPulse.com.au  Fact Sheet for Healthcare Providers:  IncredibleEmployment.be  This test is no t yet approved or cleared  by the [place] and  has been authorized for detection and/or diagnosis of SARS-CoV-2 by FDA under an Emergency Use Authorization (EUA). This EUA will remain  in effect (meaning this test can be used) for the duration of the COVID-19 declaration under Section 564(b)(1) of the Act, 21 U.S.C.section 360bbb-3(b)(1), unless the authorization is terminated  or revoked sooner.       Influenza A by PCR NEGATIVE NEGATIVE Final   Influenza B by PCR NEGATIVE NEGATIVE Final    Comment: (NOTE) The Xpert Xpress SARS-CoV-2/FLU/RSV plus assay is intended  as an aid in the diagnosis of influenza from Nasopharyngeal swab specimens and should not be used as a sole basis for treatment. Nasal washings and aspirates are unacceptable for Xpert Xpress SARS-CoV-2/FLU/RSV testing.  Fact Sheet for Patients: EntrepreneurPulse.com.au  Fact Sheet for Healthcare Providers: IncredibleEmployment.be  This test is not yet approved or cleared by the [place] FDA and has been authorized for detection and/or diagnosis of SARS-CoV-2 by FDA under an Emergency Use Authorization (EUA). This EUA will remain in effect (meaning this test can be used) for the duration of the COVID-19 declaration under Section 564(b)(1) of the Act, 21 U.S.C. section 360bbb-3(b)(1), unless the authorization is terminated or revoked.  Performed at [facility], [place] [address] [place], [place] [address]          Radiology Studies: CT ABDOMEN PELVIS W CONTRAST  Result Date: 01/16/2022 CLINICAL DATA:  Jaundice EXAM: CT ABDOMEN AND PELVIS WITH CONTRAST TECHNIQUE: Multidetector CT imaging of the abdomen and pelvis was performed using the standard protocol following bolus administration of intravenous contrast. RADIATION DOSE REDUCTION: This exam was performed according to the departmental dose-optimization program which includes automated exposure control, adjustment of the mA and/or kV according to patient size and/or use of iterative reconstruction technique. CONTRAST:  139mL OMNIPAQUE IOHEXOL 300 MG/ML  SOLN COMPARISON:  CT 10/31/2018 FINDINGS: Lower chest: Lung bases demonstrate no acute consolidation or effusion. Normal cardiac size. Hepatobiliary: Marked intra hepatic biliary dilatation. Dilated common bile duct measuring up to 16 mm. Distended gallbladder without stones. Pancreas: Atrophic body and tail with marked pancreatic ductal dilatation up to 13 mm. Ill-defined hypodense mass at the pancreatic head/neck region measuring  approximately 3.9 by 2.6 cm, series 3, image 31, there is a coarse calcification. Spleen: Normal in size without focal abnormality. Adrenals/Urinary Tract: Adrenal glands are within normal limits. Kidneys show no hydronephrosis. The bladder is unremarkable Stomach/Bowel: The stomach is nonenlarged. No dilated small bowel. No acute bowel wall thickening. Negative appendix. Diverticular disease of the sigmoid colon. Vascular/Lymphatic: Nonaneurysmal aorta. No suspicious lymph nodes. Mild aortic atherosclerosis. Marked narrowing of the SMV just before the portal vein confluence, by the ill-defined mass. Reproductive: No adnexal mass. Focal endometrial thickening up to 9 mm. Other: Negative for pelvic effusion or free air. Musculoskeletal: No acute osseous abnormality IMPRESSION: 1. Marked intra and extrahepatic biliary dilatation with marked dilatation of pancreatic duct and distended gallbladder. There is significant atrophy of the body and tail of pancreas. 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 2. Diverticular disease of the colon without acute wall thickening 3. Mild endometrial thickening up to 9 mm, recommend nonemergent pelvic ultrasound for further evaluation Electronically Signed   By: Donavan Foil M.D.   On: 01/16/2022 23:38   DG ERCP  Result Date: 01/18/2022 CLINICAL DATA:  Biliary obstruction. EXAM: ERCP COMPARISON:  MRI abdomen, 01/17/2022.  CT AP, 01/16/2022. FLUOROSCOPY: Exposure Index (as provided by the fluoroscopic device): 31 mGy Kerma FINDINGS: Multiple, limited oblique planar images of the RIGHT upper quadrant obtained C-arm. Images demonstrating flexible endoscopy, biliary duct cannulation, retrograde cholangiogram and metallic biliary stent placement. Biliary ductal dilation with distal CBD obstruction is demonstrated. IMPRESSION: Fluoroscopic  imaging for ERCP and metallic biliary stent placement. Biliary ductal dilation with distal CBD obstruction is demonstrated. For complete description of intra procedural findings, please see performing service dictation. Electronically Signed   By: Michaelle Birks M.D.   On: 01/18/2022 12:07   MR ABDOMEN MRCP W WO CONTAST  Result Date: 01/17/2022 CLINICAL DATA:  Progressive jaundice. Intra and extrahepatic biliary dilatation and pancreatic ductal dilatation on CT. EXAM: MRI ABDOMEN WITHOUT AND WITH CONTRAST (INCLUDING MRCP) TECHNIQUE: Multiplanar multisequence MR imaging of the abdomen was performed both before and after the administration of intravenous contrast. Heavily T2-weighted images of the biliary and pancreatic ducts were obtained, and three-dimensional MRCP images were rendered by post processing. CONTRAST:  7.45mL GADAVIST GADOBUTROL 1 MMOL/ML IV SOLN COMPARISON:  Abdominopelvic CT 01/16/2022, 10/31/2018 and 10/29/2011. FINDINGS: Lower chest:  The visualized lower chest appears unremarkable. Hepatobiliary: The liver is normal in signal without signs of significant steatosis or cirrhosis. No focal liver lesions or abnormal enhancement identified. As seen on recent CT, there is marked intra and extrahepatic biliary dilatation which is new from 2019. The common hepatic duct measures up to 1.8 cm in diameter. No evidence of gallstones, gallbladder wall thickening or choledocholithiasis. There is abrupt cut off in the dilatation of the extrahepatic biliary system within the pancreatic head. Pancreas: There is diffuse parenchymal atrophy and ductal dilatation throughout the pancreatic body and tail as seen on previous CT. There is probable hemorrhagic or proteinaceous intraductal material within the pancreatic body measuring up to 2.7 x 1.6 cm on image 24/4. Central to this, there is an apparent intraluminal mass within the pancreatic head measuring approximately 1.8 x 1.5 cm on image 81/1102. This demonstrates  low-level enhancement following contrast and is the presumed cause of the biliary obstruction. Spleen: Normal in size without focal abnormality. Adrenals/Urinary Tract: Both adrenal glands appear normal. Small renal cysts are present bilaterally, including a 7 mm lesion in the interpolar region of the right kidney which demonstrates intrinsic T1 shortening consistent with a hemorrhagic or proteinaceous cyst. No enhancing renal masses or hydronephrosis. Stomach/Bowel: The stomach appears unremarkable for its degree of distension. No evidence of bowel wall thickening, distention or surrounding inflammatory change. Vascular/Lymphatic: There are no enlarged abdominal lymph nodes. No acute arterial abnormalities are identified. There is mild aortic and branch vessel atherosclerosis. As seen on CT, there is marked luminal narrowing at the portal/SMV confluence, but no evidence of intravascular thrombus. The splenic vein and renal veins are patent. Other: No evidence of abdominal wall hernia or ascites. Musculoskeletal: Multilevel spondylosis. Subacute inferior right rib fractures laterally with associated enhancement following contrast. No suspicious osseous lesions are seen. IMPRESSION: 1. Obstructing mass within the pancreatic head with an intraluminal component demonstrating low level enhancement. 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 2. This mass causes significant extrinsic narrowing at the portal/superior mesenteric vein confluence as well as marked intra and extrahepatic biliary dilatation. 3. No distant metastases identified. 4. Endoscopy for endoscopic ultrasound, tissue sampling and biliary decompression recommended. Electronically Signed   By: Gwyndolyn Saxon  Lin Landsman M.D.   On: 01/17/2022 13:29    Scheduled Meds:  amLODipine  5 mg Oral Daily    feeding supplement  237 mL Oral TID BM   insulin aspart  0-6 Units Subcutaneous TID WC   losartan  100 mg Oral Daily   multivitamin with minerals  1 tablet Oral Daily   Continuous Infusions:   LOS: 1 day    Time spent: 50 mins   Shawna Clamp, MD [facility] Available via Epic secure chat 7am-7pm After these hours, please refer to coverage provider listed on [email or web address] 01/18/2022, 3:46 PM

## 2022-01-18 NOTE — Anesthesia Procedure Notes (Signed)
Procedure Name: Intubation ?Date/Time: 01/18/2022 10:10 AM ?Performed by: Reece Agar, CRNA ?Pre-anesthesia Checklist: Patient identified, Emergency Drugs available, Suction available and Patient being monitored ?Patient Re-evaluated:Patient Re-evaluated prior to induction ?Oxygen Delivery Method: Circle System Utilized ?Preoxygenation: Pre-oxygenation with 100% oxygen ?Induction Type: IV induction ?Ventilation: Mask ventilation without difficulty ?Laryngoscope Size: Mac and 3 ?Grade View: Grade I ?Tube type: Oral ?Tube size: 7.0 mm ?Number of attempts: 1 ?Airway Equipment and Method: Stylet ?Placement Confirmation: ETT inserted through vocal cords under direct vision, positive ETCO2 and breath sounds checked- equal and bilateral ?Secured at: 21 cm ?Tube secured with: Tape ?Dental Injury: Teeth and Oropharynx as per pre-operative assessment  ? ? ? ? ?

## 2022-01-18 NOTE — Op Note (Signed)
[facility] ?Patient Name: Miranda Francis ?Procedure Date : 01/18/2022 ?MRN: [id number] ?Attending MD: Carol Ada , MD ?Date of Birth: [birth year] ?CSN: [id number] ?Age: 76 ?Admit Type: Inpatient ?Procedure:                Upper EUS ?Indications:              Pancreatic adenocarcinoma ?Providers:                Carol Ada, MD, Dulcy Fanny, [facility],  ?                          Technician ?Referring MD:              ?Medicines:                General Anesthesia ?Complications:            No immediate complications. ?Estimated Blood Loss:     Estimated blood loss: none. ?Procedure:                Pre-Anesthesia Assessment: ?                          - Prior to the procedure, a History and Physical  ?                          was performed, and patient medications and  ?                          allergies were reviewed. The patient's tolerance of  ?                          previous anesthesia was also reviewed. The risks  ?                          and benefits of the procedure and the sedation  ?                          options and risks were discussed with the patient.  ?                          All questions were answered, and informed consent  ?                          was obtained. Prior Anticoagulants: The patient has  ?                          taken no previous anticoagulant or antiplatelet  ?                          agents. ASA Grade Assessment: III - A patient with  ?                          severe systemic disease. After reviewing the risks  ?                          and benefits, the  patient was deemed in  ?                          satisfactory condition to undergo the procedure. ?                          - Sedation was administered by an anesthesia  ?                          professional. General anesthesia was attained. ?                          After obtaining informed consent, the endoscope was  ?                          passed under direct vision. Throughout the  ?                           procedure, the patient's blood pressure, pulse, and  ?                          oxygen saturations were monitored continuously. The  ?                          [id number] ([id number]) Olympus linear ultrasound scope  ?                          was introduced through the mouth, and advanced to  ?                          the duodenal bulb. The upper EUS was accomplished  ?                          without difficulty. The patient tolerated the  ?                          procedure well. ?Scope In: ?Scope Out: ?Findings: ?     ENDOSONOGRAPHIC FINDING: : ?     An irregular mass was identified in the pancreatic head. The mass was  ?     hypoechoic. The mass measured 22 mm by 22 mm in maximal cross-sectional  ?     diameter. The endosonographic borders were poorly-defined. The remainder  ?     of the pancreas was examined. The endosonographic appearance of  ?     parenchyma and the upstream pancreatic duct indicated duct dilation, a  ?     maximum duct diameter of 10 mm and parenchymal atrophy. Fine needle  ?     aspiration for cytology was performed. Color Doppler imaging was  ?     utilized prior to needle puncture to confirm a lack of significant  ?     vascular structures within the needle path. Five passes were made with  ?     the 25 gauge needle using a transduodenal approach. A stylet was used. A  ?     cytotechnologist was present to evaluate the adequacy of the specimen.  ?  Final cytology results are pending. ?     There was dilation in the common bile duct which measured up to 14 mm. ?     After completion of the ERCP the linear EUS was available. The  ?     evaluation showed a large 22 x 22 mm mass in the head of the pancreas.  ?     The borders were irregular and the lesion was hypoechoic. The inferior  ?     portion of the mass bordered the SMV, but there was no clear evidence of  ?     invasion. The celiac axis was normal. The body and tail of the pancreas  ?     was atrophic and the  PD was dilated from 6.5 mm to 10 mm. The CBD was  ?     dilated maximally at 14 mm and the metallic stent was identified. The  ?     gallbladder was distended. There were no obvious hepatic lesions in the  ?     left lobe of the liver, but there was evidence of intrahepatic biliary  ?     ductal dilation. The left adrenal was normal. Five passes with the 25  ?     gauge FNA needle were performed and a good sampling was obtained. ?Impression:               - A mass was identified in the pancreatic head.  ?                          Fine needle aspiration performed. ?                          - There was dilation in the common bile duct which  ?                          measured up to 14 mm. ?Recommendation:           - Return patient to hospital ward for ongoing care. ?                          - Resume regular diet. ?Procedure Code(s):        --- Professional --- ?                          [phone], Esophagogastroduodenoscopy, flexible,  ?                          transoral; with transendoscopic ultrasound-guided  ?                          intramural or transmural fine needle  ?                          aspiration/biopsy(s), (includes endoscopic  ?                          ultrasound examination limited to the esophagus,  ?                          stomach or duodenum, and adjacent  structures) ?Diagnosis Code(s):        --- Professional --- ?                          K86.89, Other specified diseases of pancreas ?                          C25.9, Malignant neoplasm of pancreas, unspecified ?                          K83.8, Other specified diseases of biliary tract ?CPT copyright 2019 American Medical Association. All rights reserved. ?The codes documented in this report are preliminary and upon coder review may  ?be revised to meet current compliance requirements. ?Carol Ada, MD ?Carol Ada, MD ?01/18/2022 12:21:47 PM ?This report has been signed electronically. ?Number of Addenda: 0 ?

## 2022-01-18 NOTE — Transfer of Care (Signed)
Immediate Anesthesia Transfer of Care Note ? ?Patient: Miranda Francis ? ?Procedure(s) Performed: ENDOSCOPIC RETROGRADE CHOLANGIOPANCREATOGRAPHY (ERCP) ?UPPER ENDOSCOPIC ULTRASOUND (EUS) LINEAR ?SPHINCTEROTOMY ?BILIARY STENT PLACEMENT ?FINE NEEDLE ASPIRATION (FNA) LINEAR ? ?Patient Location: PACU ? ?Anesthesia Type:General ? ?Level of Consciousness: awake and alert  ? ?Airway & Oxygen Therapy: Patient Spontanous Breathing and Patient connected to face mask oxygen ? ?Post-op Assessment: Report given to RN and Post -op Vital signs reviewed and stable ? ?Post vital signs: Reviewed and stable ? ?Last Vitals:  ?Vitals Value Taken Time  ?BP 141/78 01/18/22 1217  ?Temp    ?Pulse 71 01/18/22 1219  ?Resp 17 01/18/22 1219  ?SpO2 100 % 01/18/22 1219  ?Vitals shown include unvalidated device data. ? ?Last Pain:  ?Vitals:  ? 01/18/22 0825  ?TempSrc: Temporal  ?PainSc: 0-No pain  ?   ? ?  ? ?Complications: No notable events documented. ?

## 2022-01-18 NOTE — Progress Notes (Signed)
Mobility Specialist Progress Note  ? ? 01/18/22 1633  ?Mobility  ?Activity Refused mobility  ? ?Pt stated she was not feeling up to it. Will f/u tomorrow as schedule permits. ? ?Hildred Alamin ?Mobility Specialist  ?M.S. 5N: [phone]  ?

## 2022-01-18 NOTE — Op Note (Signed)
[facility] ?Patient Name: Miranda Francis ?Procedure Date : 01/18/2022 ?MRN: [id number] ?Attending MD: Carol Ada , MD ?Date of Birth: December 26, 1945 ?CSN: [id number] ?Age: 76 ?Admit Type: Inpatient ?Procedure:                ERCP ?Indications:              Malignant stricture of the common bile duct ?Providers:                Carol Ada, MD, Dulcy Fanny, [facility],  ?                          Technician ?Referring MD:              ?Medicines:                General Anesthesia ?Complications:            No immediate complications. ?Estimated Blood Loss:     Estimated blood loss: none. ?Procedure:                Pre-Anesthesia Assessment: ?                          - Prior to the procedure, a History and Physical  ?                          was performed, and patient medications and  ?                          allergies were reviewed. The patient's tolerance of  ?                          previous anesthesia was also reviewed. The risks  ?                          and benefits of the procedure and the sedation  ?                          options and risks were discussed with the patient.  ?                          All questions were answered, and informed consent  ?                          was obtained. Prior Anticoagulants: The patient has  ?                          taken no previous anticoagulant or antiplatelet  ?                          agents. ASA Grade Assessment: III - A patient with  ?                          severe systemic disease. After reviewing the risks  ?  and benefits, the patient was deemed in  ?                          satisfactory condition to undergo the procedure. ?                          - Sedation was administered by an anesthesia  ?                          professional. General anesthesia was attained. ?                          After obtaining informed consent, the scope was  ?                          passed under direct vision.  Throughout the  ?                          procedure, the patient's blood pressure, pulse, and  ?                          oxygen saturations were monitored continuously. The  ?                          [id number]-[id number] ([id number]) Olympus duodenoscope was  ?                          introduced through the mouth, and used to inject  ?                          contrast into and used to inject contrast into the  ?                          bile duct. The ERCP was technically difficult and  ?                          complex. The patient tolerated the procedure well. ?Scope In: ?Scope Out: ?Findings: ?     The major papilla was normal. A long 0.025 inch Jagwire was passed into  ?     the biliary tree. A 10 mm biliary sphincterotomy was made with a  ?     monofilament traction (standard) sphincterotome using ERBE  ?     electrocautery. There was no post-sphincterotomy bleeding. One 10 mm by  ?     6 cm covered metal stent was placed 5 cm into the common bile duct. Bile  ?     flowed through the stent. The stent was in good position. ?     The patient had a large ampulla and a perampullary diverticulum.  ?     Cannulation was difficult, but with persistence the CBD was able to be  ?     cannulated. The guidewire was secured at the bifurcation.Contrast  ?     injection revealed a dilated CBD at approximately 15 mm. There was a  ?     long stricture in the distal CBD measuring approximately 5 cm. A 1  cm  ?     sphincterotomy was created and then a 10 mm x 60 mm covered metallic  ?     stent was successfully placed. There was copious drainage of dark bile  ?     afterwards. ?Impression:               - The major papilla appeared normal. ?                          - A biliary sphincterotomy was performed. ?                          - One covered metal stent was placed into the  ?                          common bile duct. ?Recommendation:           - Proceed with the EUS with FNA. ?Procedure Code(s):        --- Professional --- ?                           [id number], Endoscopic retrograde  ?                          cholangiopancreatography (ERCP); with placement of  ?                          endoscopic stent into biliary or pancreatic duct,  ?                          including pre- and post-dilation and guide wire  ?                          passage, when performed, including sphincterotomy,  ?                          when performed, each stent ?Diagnosis Code(s):        --- Professional --- ?                          K83.1, Obstruction of bile duct ?CPT copyright 2019 [employer]. All rights reserved. ?The codes documented in this report are preliminary and upon coder review may  ?be revised to meet current compliance requirements. ?Carol Ada, MD ?Carol Ada, MD ?01/18/2022 12:08:59 PM ?This report has been signed electronically. ?Number of Addenda: 0 ?

## 2022-01-19 DIAGNOSIS — K9189 Other postprocedural complications and disorders of digestive system: Secondary | ICD-10-CM

## 2022-01-19 DIAGNOSIS — K8689 Other specified diseases of pancreas: Secondary | ICD-10-CM | POA: Diagnosis not present

## 2022-01-19 DIAGNOSIS — K859 Acute pancreatitis without necrosis or infection, unspecified: Secondary | ICD-10-CM

## 2022-01-19 LAB — COMPREHENSIVE METABOLIC PANEL
ALT: 460 U/L — ABNORMAL HIGH (ref 0–44)
AST: 527 U/L — ABNORMAL HIGH (ref 15–41)
Albumin: 2.2 g/dL — ABNORMAL LOW (ref 3.5–5.0)
Alkaline Phosphatase: 884 U/L — ABNORMAL HIGH (ref 38–126)
Anion gap: 10 (ref 5–15)
BUN: 19 mg/dL (ref 8–23)
CO2: 23 mmol/L (ref 22–32)
Calcium: 9.1 mg/dL (ref 8.9–10.3)
Chloride: 101 mmol/L (ref 98–111)
Creatinine, Ser: 1.15 mg/dL — ABNORMAL HIGH (ref 0.44–1.00)
GFR, Estimated: 50 mL/min — ABNORMAL LOW (ref >60)
Glucose, Bld: 196 mg/dL — ABNORMAL HIGH (ref 70–99)
Potassium: 3.3 mmol/L — ABNORMAL LOW (ref 3.5–5.1)
Sodium: 134 mmol/L — ABNORMAL LOW (ref 135–145)
Total Bilirubin: 29.7 mg/dL (ref 0.3–1.2)
Total Protein: 5.6 g/dL — ABNORMAL LOW (ref 6.5–8.1)

## 2022-01-19 LAB — CBC
HCT: 24.3 % — ABNORMAL LOW (ref 36.0–46.0)
Hemoglobin: 8.7 g/dL — ABNORMAL LOW (ref 12.0–15.0)
MCH: 29.8 pg (ref 26.0–34.0)
MCHC: 35.8 g/dL (ref 30.0–36.0)
MCV: 83.2 fL (ref 80.0–100.0)
Platelets: 175 10*3/uL (ref 150–400)
RBC: 2.92 MIL/uL — ABNORMAL LOW (ref 3.87–5.11)
RDW: 33 % — ABNORMAL HIGH (ref 11.5–15.5)
WBC: 9.3 10*3/uL (ref 4.0–10.5)
nRBC: 0.2 % (ref 0.0–0.2)

## 2022-01-19 LAB — MAGNESIUM: Magnesium: 1.9 mg/dL (ref 1.7–2.4)

## 2022-01-19 LAB — LIPASE, BLOOD: Lipase: 1688 U/L — ABNORMAL HIGH (ref 11–51)

## 2022-01-19 LAB — GLUCOSE, CAPILLARY
Glucose-Capillary: 203 mg/dL — ABNORMAL HIGH (ref 70–99)
Glucose-Capillary: 176 mg/dL — ABNORMAL HIGH (ref 70–99)
Glucose-Capillary: 171 mg/dL — ABNORMAL HIGH (ref 70–99)
Glucose-Capillary: 133 mg/dL — ABNORMAL HIGH (ref 70–99)

## 2022-01-19 LAB — PHOSPHORUS: Phosphorus: 5.5 mg/dL — ABNORMAL HIGH (ref 2.5–4.6)

## 2022-01-19 MED ORDER — OXYCODONE HCL 5 MG PO TABS: 5.0000 mg | ORAL_TABLET | ORAL | Status: DC | PRN

## 2022-01-19 MED ORDER — SODIUM CHLORIDE 0.9 % IV SOLN: INTRAVENOUS | Status: DC

## 2022-01-19 MED ORDER — POTASSIUM CHLORIDE 20 MEQ PO PACK
40.0000 meq | PACK | Freq: Once | ORAL | Status: AC
  Administered 2022-01-19: 40 meq via ORAL
  Filled 2022-01-19: qty 2

## 2022-01-19 MED ORDER — OXYCODONE HCL 5 MG PO TABS: 10.0000 mg | ORAL_TABLET | ORAL | Status: DC | PRN

## 2022-01-19 MED ORDER — LACTATED RINGERS IV SOLN: INTRAVENOUS | Status: DC

## 2022-01-19 MED ORDER — MORPHINE SULFATE (PF) 2 MG/ML IV SOLN
2.0000 mg | INTRAVENOUS | Status: AC
  Administered 2022-01-19: 2 mg via INTRAVENOUS
  Filled 2022-01-19: qty 1

## 2022-01-19 MED ORDER — OXYCODONE HCL 5 MG PO TABS
5.0000 mg | ORAL_TABLET | ORAL | Status: DC | PRN
  Administered 2022-01-19 – 2022-01-25 (×16): 5 mg via ORAL
  Filled 2022-01-19 (×16): qty 1

## 2022-01-19 NOTE — Anesthesia Postprocedure Evaluation (Signed)
Anesthesia Post Note ? ?Patient: Miranda Francis ? ?Procedure(s) Performed: ENDOSCOPIC RETROGRADE CHOLANGIOPANCREATOGRAPHY (ERCP) ?UPPER ENDOSCOPIC ULTRASOUND (EUS) LINEAR ?SPHINCTEROTOMY ?BILIARY STENT PLACEMENT ?FINE NEEDLE ASPIRATION (FNA) LINEAR ? ?  ? ?Patient location during evaluation: PACU ?Anesthesia Type: General ?Level of consciousness: awake and alert ?Pain management: pain level controlled ?Vital Signs Assessment: post-procedure vital signs reviewed and stable ?Respiratory status: spontaneous breathing, nonlabored ventilation, respiratory function stable and patient connected to nasal cannula oxygen ?Cardiovascular status: blood pressure returned to baseline and stable ?Postop Assessment: no apparent nausea or vomiting ?Anesthetic complications: no ? ? ?No notable events documented. ? ?Last Vitals:  ?Vitals:  ? 01/19/22 0637 01/19/22 0749  ?BP: (!) 105/57 108/60  ?Pulse: 88   ?Resp: 18 18  ?Temp: 36.8 ?C 36.7 ?C  ?SpO2: 99%   ?  ?Last Pain:  ?Vitals:  ? 01/19/22 0749  ?TempSrc: Oral  ?PainSc:   ? ? ?  ?  ?  ?  ?  ?  ? ?Rykar Lebleu L Zanyia Silbaugh ? ? ? ? ?

## 2022-01-19 NOTE — Assessment & Plan Note (Addendum)
S/p ERCP,she developed RUQ pain,Lipase 1600 consistent with post ERCP pancreatitis Abd pain has improved.We will advance diet to full liquid

## 2022-01-19 NOTE — Plan of Care (Signed)

## 2022-01-19 NOTE — Progress Notes (Signed)
Mobility Specialist Progress Note  ? ? 01/19/22 1504  ?Mobility  ?Activity Refused mobility  ? ?Checked on pt this am and asked me to come back d/t pain. Pt still c/o pain this pm. Will f/u as schedule permits.  ? ?Hildred Alamin ?Mobility Specialist  ?M.S. 5N: [phone]  ?

## 2022-01-19 NOTE — Progress Notes (Addendum)
Subjective: ?Abdominal pain.  The pain started last evening and the oxycodone does not last long enough. ? ?Objective: ?Vital signs in last 24 hours: ?Temp:  [97.3 ?F (36.3 ?C)-98.5 ?F (36.9 ?C)] 98.1 ?F (36.7 ?C) (03/10 0749) ?Pulse Rate:  [55-88] 88 (03/10 0637) ?Resp:  [13-18] 18 (03/10 0749) ?BP: (104-149)/(57-83) 108/60 (03/10 0749) ?SpO2:  [99 %-100 %] 99 % (03/10 6222) ?Last BM Date : 01/17/22 ? ?Intake/Output from previous day: ?03/09 0701 - 03/10 0700 ?In: 1000 [I.V.:800; IV Piggyback:200] ?Out: 0  ?Intake/Output this shift: ?No intake/output data recorded. ? ?General appearance: uncomfortable ?GI: tender in the mid abdomen, no acute abdomen ? ?Lab Results: ?Recent Labs  ?  01/16/22 ?1830 01/19/22 ?0327  ?WBC 7.6 9.3  ?HGB 9.8* 8.7*  ?HCT 28.3* 24.3*  ?PLT 202 175  ? ?BMET ?Recent Labs  ?  01/16/22 ?1830 01/18/22 ?9798 01/19/22 ?0327  ?NA 133* 135 134*  ?K 3.5 3.6 3.3*  ?CL 103 107 101  ?CO2 21* 19* 23  ?GLUCOSE 165* 131* 196*  ?BUN '19 18 19  '$ ?CREATININE 0.80 1.01* 1.15*  ?CALCIUM 9.7 9.1 9.1  ? ?LFT ?Recent Labs  ?  01/19/22 ?0327  ?PROT 5.6*  ?ALBUMIN 2.2*  ?AST 527*  ?ALT 460*  ?ALKPHOS 884*  ?BILITOT 29.7*  ? ?PT/INR ?Recent Labs  ?  01/16/22 ?2250  ?LABPROT 16.5*  ?INR 1.3*  ? ?Hepatitis Panel ?No results for input(s): HEPBSAG, HCVAB, HEPAIGM, HEPBIGM in the last 72 hours. ?C-Diff ?No results for input(s): CDIFFTOX in the last 72 hours. ?Fecal Lactopherrin ?No results for input(s): FECLLACTOFRN in the last 72 hours. ? ?Studies/Results: ?DG ERCP ? ?Result Date: 01/18/2022 ?CLINICAL DATA:  Biliary obstruction. EXAM: ERCP COMPARISON:  MRI abdomen, 01/17/2022.  CT AP, 01/16/2022. FLUOROSCOPY: Exposure Index (as provided by the fluoroscopic device): 31 mGy Kerma FINDINGS: Multiple, limited oblique planar images of the RIGHT upper quadrant obtained C-arm. Images demonstrating flexible endoscopy, biliary duct cannulation, retrograde cholangiogram and metallic biliary stent placement. Biliary ductal dilation  with distal CBD obstruction is demonstrated. IMPRESSION: Fluoroscopic imaging for ERCP and metallic biliary stent placement. Biliary ductal dilation with distal CBD obstruction is demonstrated. For complete description of intra procedural findings, please see performing service dictation. Electronically Signed   By: Michaelle Birks M.D.   On: 01/18/2022 12:07  ? ?MR ABDOMEN MRCP W WO CONTAST ? ?Result Date: 01/17/2022 ?CLINICAL DATA:  Progressive jaundice. Intra and extrahepatic biliary dilatation and pancreatic ductal dilatation on CT. EXAM: MRI ABDOMEN WITHOUT AND WITH CONTRAST (INCLUDING MRCP) TECHNIQUE: Multiplanar multisequence MR imaging of the abdomen was performed both before and after the administration of intravenous contrast. Heavily T2-weighted images of the biliary and pancreatic ducts were obtained, and three-dimensional MRCP images were rendered by post processing. CONTRAST:  7.75m GADAVIST GADOBUTROL 1 MMOL/ML IV SOLN COMPARISON:  Abdominopelvic CT 01/16/2022, 10/31/2018 and 10/29/2011. FINDINGS: Lower chest:  The visualized lower chest appears unremarkable. Hepatobiliary: The liver is normal in signal without signs of significant steatosis or cirrhosis. No focal liver lesions or abnormal enhancement identified. As seen on recent CT, there is marked intra and extrahepatic biliary dilatation which is new from 2019. The common hepatic duct measures up to 1.8 cm in diameter. No evidence of gallstones, gallbladder wall thickening or choledocholithiasis. There is abrupt cut off in the dilatation of the extrahepatic biliary system within the pancreatic head. Pancreas: There is diffuse parenchymal atrophy and ductal dilatation throughout the pancreatic body and tail as seen on previous CT. There is probable hemorrhagic or proteinaceous  intraductal material within the pancreatic body measuring up to 2.7 x 1.6 cm on image 24/4. Central to this, there is an apparent intraluminal mass within the pancreatic head  measuring approximately 1.8 x 1.5 cm on image 81/1102. This demonstrates low-level enhancement following contrast and is the presumed cause of the biliary obstruction. Spleen: Normal in size without focal abnormality. Adrenals/Urinary Tract: Both adrenal glands appear normal. Small renal cysts are present bilaterally, including a 7 mm lesion in the interpolar region of the right kidney which demonstrates intrinsic T1 shortening consistent with a hemorrhagic or proteinaceous cyst. No enhancing renal masses or hydronephrosis. Stomach/Bowel: The stomach appears unremarkable for its degree of distension. No evidence of bowel wall thickening, distention or surrounding inflammatory change. Vascular/Lymphatic: There are no enlarged abdominal lymph nodes. No acute arterial abnormalities are identified. There is mild aortic and branch vessel atherosclerosis. As seen on CT, there is marked luminal narrowing at the portal/SMV confluence, but no evidence of intravascular thrombus. The splenic vein and renal veins are patent. Other: No evidence of abdominal wall hernia or ascites. Musculoskeletal: Multilevel spondylosis. Subacute inferior right rib fractures laterally with associated enhancement following contrast. No suspicious osseous lesions are seen. IMPRESSION: 1. Obstructing mass within the pancreatic head with an intraluminal component demonstrating low level enhancement. 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 2. This mass causes significant extrinsic narrowing at the portal/superior mesenteric vein confluence as well as marked intra and extrahepatic biliary dilatation. 3. No distant metastases identified. 4. Endoscopy for endoscopic ultrasound, tissue sampling and biliary decompression recommended. Electronically Signed   By: Richardean Sale M.D.   On:  01/17/2022 13:29   ? ?Medications: Scheduled: ? amLODipine  5 mg Oral Daily  ? feeding supplement  237 mL Oral TID BM  ? insulin aspart  0-6 Units Subcutaneous TID WC  ? losartan  100 mg Oral Daily  ?  morphine injection  2 mg Intravenous NOW  ? multivitamin with minerals  1 tablet Oral Daily  ? ?Continuous: ? ?Assessment/Plan: ?1) Pancreatic cancer - presumed. ?2) Post ERCP/EUS abdominal pain. ?3) Abnormal liver enzymes. ? ? The patient's blood work is improving.  Clinically she is uncomfortable with abdominal pain.  The pain started last evening acutely.  Immediately post procedure she did not complain about any pain. ? ?Plan: ?1) Increase oxycodone frequency to q3 hours. ?2) One dose of morphine now. ?3) Check lipase. ?4) [facility] will be covering this weekend. ? ?ADDENDUM: ?The patient's lipase is elevated at 1600.  She has post ERCP pancreatitis.  Continue with pain medications and increase the IV fluids to 100 ml/hour.  Change to lactated ringer. ? LOS: 2 days  ? ?Haruko Mersch D ?01/19/2022, 8:00 AM  ?

## 2022-01-19 NOTE — Progress Notes (Signed)
Inpatient Diabetes Program Recommendations ? ?AACE/ADA: New Consensus Statement on Inpatient Glycemic Control  ? ?Target Ranges:  Prepandial:   less than 140 mg/dL ?     Peak postprandial:   less than 180 mg/dL (1-2 hours) ?     Critically ill patients:  140 - 180 mg/dL  ? ? Latest Reference Range & Units 01/19/22 06:36 01/19/22 11:32  ?Glucose-Capillary 70 - 99 mg/dL 203 (H) 176 (H)  ? ? Latest Reference Range & Units 01/18/22 07:37 01/18/22 12:28 01/18/22 15:51 01/18/22 22:25  ?Glucose-Capillary 70 - 99 mg/dL 131 (H) 146 (H) 164 (H) 218 (H)  ? ? Latest Reference Range & Units 01/17/22 01:18  ?Hemoglobin A1C 4.8 - 5.6 % 4.6 (L)  ? ?Review of Glycemic Control ? ?Diabetes history: DM2 ?Outpatient Diabetes medications: Glipizide 2.5 mg BID ?Current orders for Inpatient glycemic control: Novolog 0-6 units TID with meals ? ?Inpatient Diabetes Program Recommendations:   ? ?Diet: Currently ordered Regular diet. If appropriate, may want to consider changing to Carb Modified diet. ? ?HbgA1C:  A1C 4.6% on 01/17/22 indicating an average glucose of 88 mg/dl over the past 2-3 months. May want to consider adjusting outpatient DM medications at discharge. ? ?Thanks, ?Barnie Alderman, RN, MSN, CDE ?Diabetes Coordinator ?Inpatient Diabetes Program ?[phone] (Team Pager from 8am to 5pm) ? ? ? ? ?

## 2022-01-19 NOTE — Progress Notes (Addendum)
` PROGRESS NOTE    Miranda Francis  TKU:[id number] DOB: 10/12/1946 DOA: 01/16/2022 PCP: Karle Plumber, MD    Brief Narrative:  Miranda Francis is a 76 year old female with PMH significant for essential hypertension, type 2 diabetes mellitus who presented to [facility] ED on 3/7 for progressive skin changes, poor appetite and weight loss.  Patient was recently referred to  gastroenterologist with recent labs concerning for elevated transaminases and bilirubin and was referred to the ED for further evaluation.  Patient reports over the last month she has been eating very minimally, lost roughly 30 pounds.  In the ED,  Labs includes Alkaline phosphatase 959, lipase 45, AST 269, ALT 343, total bilirubin 39.3.  Ammonia level 46.  INR 1.3.  Tylenol level less than 10.  COVID-19 PCR negative.  Urinalysis unrevealing.  CT abdomen/pelvis with marked intra and extrahepatic biliary dilation with marked dilation of the pancreatic duct and distended gallbladder,  ill-defined soft tissue mass at the pancreatic head/neck region concerning for neoplasm/pancreatic carcinoma.  GI was consulted , underwent ERCP found to have a mass in the pancreatic head, fine-needle aspiration completed. Post ERCP lipase 1600, consistent with post ERCP pancreatitis.  She is kept n.p.o. IV hydration pain control.     Assessment and Plan: * Pancreatic mass Patient presented with progressive weight loss 30 pounds+, poor appetite and skin discoloration over last 1 month.  She was referred to outpatient gastroenterology and has labs performed and due to abnormality she was sent in the ED for further evaluation. Significant LFT, bilirubin and alk phos elevation on admission.   CT abdomen/pelvis with marked intra and extrahepatic biliary dilation with marked dilation of the pancreatic duct and distended gallbladder,  ill-defined soft tissue mass at the pancreatic head/neck region  concerning for neoplasm/pancreatic  carcinoma. Gastroenterology consulted.  Patient underwent ERCP. She is found to have a mass in the pancreatic head.  Biopsy done. Post ERCP  lipase 1600 consistent with post ERCP pancreatitis.  DM (diabetes mellitus) (HCC) HbA1c 4.6.  Well-controlled Diet controlled  HTN (hypertension) Continue amlodipine and losartan.  Post-ERCP acute pancreatitis Patient continues to have RUQ pain, pain medication increased. Lipase 1600 consistent with post ERCP pancreatitis N.p.o. except meds, IV hydration, adequate pain control.  Jaundice Continue to trend LFTs.    DVT prophylaxis: SCDs Start: 01/17/22 0117    Code Status: Full Code Family Communication: Family present at bedside. Disposition Plan:  Level of care: Telemetry Medical Status is: Inpatient Remains inpatient appropriate because: Underwent ERCP found to have a pancreatic mass.  Further plan as per GI.  Consultants:  Gastroenterology, Dr. Loreta Ave, Dr. Elnoria Howard  Procedures:  MRCP: ERCP 3/9  Antimicrobials:  None   Subjective: Patient was seen and examined at bedside.  Overnight events noted. She is s/p ERCP found to have a pancreatic mass. Patient continues to report pain, lipase 1600 consistent with post ERCP pancreatitis. Pain medications adjusted.  Objective: Vitals:   01/18/22 1306 01/18/22 2142 01/19/22 0637 01/19/22 0749  BP: (!) 149/78 104/62 (!) 105/57 108/60  Pulse: (!) 55 71 88   Resp:  16 18 18   Temp: (!) 97.4 F (36.3 C) 98.5 F (36.9 C) 98.2 F (36.8 C) 98.1 F (36.7 C)  TempSrc: Oral Oral Oral Oral  SpO2: 100% 100% 99%   Weight:      Height:       No intake or output data in the 24 hours ending 01/19/22 1344  Filed Weights   01/17/22 0404  Weight: 72.5 kg  Examination:  Physical Exam: GEN: Appears comfortable, not in any acute distress, appears jaundiced. HEENT: PERRL, EOMI, sclera icterus. PULM: CTA bilaterally, no wheezing, no crackles, normal respiratory effort. CV: S1-S2 heard,  regular rate and rhythm, no murmur. GI: Abdomen is soft, RUQ tenderness+, non distended, BS+ MSK: No edema, no cyanosis, no clubbing. NEURO: CN II-XII intact, no focal deficits, sensation to light touch intact PSYCH: normal mood/affect Skin: Skin notably jaundiced, otherwise no concerning rashes/lesions/wounds.     Data Reviewed: I have personally reviewed following labs and imaging studies  CBC: Recent Labs  Lab 01/16/22 1830 01/19/22 0327  WBC 7.6 9.3  NEUTROABS 5.4  --   HGB 9.8* 8.7*  HCT 28.3* 24.3*  MCV 84.0 83.2  PLT 202 517   Basic Metabolic Panel: Recent Labs  Lab 01/16/22 1830 01/18/22 0050 01/19/22 0327  NA 133* 135 134*  K 3.5 3.6 3.3*  CL 103 107 101  CO2 21* 19* 23  GLUCOSE 165* 131* 196*  BUN $Re'19 18 19  'vnA$ CREATININE 0.80 1.01* 1.15*  CALCIUM 9.7 9.1 9.1  MG  --   --  1.9  PHOS  --   --  5.5*   GFR: Estimated Creatinine Clearance: 40.9 mL/min (A) (by C-G formula based on SCr of 1.15 mg/dL (H)). Liver Function Tests: Recent Labs  Lab 01/16/22 1830 01/18/22 0050 01/19/22 0327  AST 269* 238* 527*  ALT 343* 297* 460*  ALKPHOS 959* 873* 884*  BILITOT 39.3* 34.8* 29.7*  PROT 6.8 6.0* 5.6*  ALBUMIN 2.7* 2.3* 2.2*   Recent Labs  Lab 01/16/22 2250 01/19/22 0327  LIPASE 45 1,688*   Recent Labs  Lab 01/16/22 2250  AMMONIA 46*   Coagulation Profile: Recent Labs  Lab 01/16/22 2250  INR 1.3*   Cardiac Enzymes: No results for input(s): CKTOTAL, CKMB, CKMBINDEX, TROPONINI in the last 168 hours. BNP (last 3 results) No results for input(s): PROBNP in the last 8760 hours. HbA1C: Recent Labs    01/17/22 0118  HGBA1C 4.6*   CBG: Recent Labs  Lab 01/18/22 1228 01/18/22 1551 01/18/22 2225 01/19/22 0636 01/19/22 1132  GLUCAP 146* 164* 218* 203* 176*   Lipid Profile: No results for input(s): CHOL, HDL, LDLCALC, TRIG, CHOLHDL, LDLDIRECT in the last 72 hours. Thyroid Function Tests: No results for input(s): TSH, T4TOTAL, FREET4, T3FREE,  THYROIDAB in the last 72 hours. Anemia Panel: No results for input(s): VITAMINB12, FOLATE, FERRITIN, TIBC, IRON, RETICCTPCT in the last 72 hours. Sepsis Labs: No results for input(s): PROCALCITON, LATICACIDVEN in the last 168 hours.  Recent Results (from the past 240 hour(s))  Resp Panel by RT-PCR (Flu A&B, Covid) Nasopharyngeal Swab     Status: None   Collection Time: 01/16/22 10:50 PM   Specimen: Nasopharyngeal Swab; Nasopharyngeal(NP) swabs in vial transport medium  Result Value Ref Range Status   SARS Coronavirus 2 by RT PCR NEGATIVE NEGATIVE Final    Comment: (NOTE) SARS-CoV-2 target nucleic acids are NOT DETECTED.  The SARS-CoV-2 RNA is generally detectable in upper respiratory specimens during the acute phase of infection. The lowest concentration of SARS-CoV-2 viral copies this assay can detect is 138 copies/mL. A negative result does not preclude SARS-Cov-2 infection and should not be used as the sole basis for treatment or other patient management decisions. A negative result may occur with  improper specimen collection/handling, submission of specimen other than nasopharyngeal swab, presence of viral mutation(s) within the areas targeted by this assay, and inadequate number of viral copies(<138 copies/mL). A negative result must  be combined with clinical observations, patient history, and epidemiological information. The expected result is Negative.  Fact Sheet for Patients:  EntrepreneurPulse.com.au  Fact Sheet for Healthcare Providers:  IncredibleEmployment.be  This test is no t yet approved or cleared by the [place] FDA and  has been authorized for detection and/or diagnosis of SARS-CoV-2 by FDA under an Emergency Use Authorization (EUA). This EUA will remain  in effect (meaning this test can be used) for the duration of the COVID-19 declaration under Section 564(b)(1) of the Act, 21 U.S.C.section 360bbb-3(b)(1), unless the  authorization is terminated  or revoked sooner.       Influenza A by PCR NEGATIVE NEGATIVE Final   Influenza B by PCR NEGATIVE NEGATIVE Final    Comment: (NOTE) The Xpert Xpress SARS-CoV-2/FLU/RSV plus assay is intended as an aid in the diagnosis of influenza from Nasopharyngeal swab specimens and should not be used as a sole basis for treatment. Nasal washings and aspirates are unacceptable for Xpert Xpress SARS-CoV-2/FLU/RSV testing.  Fact Sheet for Patients: EntrepreneurPulse.com.au  Fact Sheet for Healthcare Providers: IncredibleEmployment.be  This test is not yet approved or cleared by the [place] FDA and has been authorized for detection and/or diagnosis of SARS-CoV-2 by FDA under an Emergency Use Authorization (EUA). This EUA will remain in effect (meaning this test can be used) for the duration of the COVID-19 declaration under Section 564(b)(1) of the Act, 21 U.S.C. section 360bbb-3(b)(1), unless the authorization is terminated or revoked.  Performed at [facility], [place] [address], [place], [place] [address]     Radiology Studies: DG ERCP  Result Date: 01/18/2022 CLINICAL DATA:  Biliary obstruction. EXAM: ERCP COMPARISON:  MRI abdomen, 01/17/2022.  CT AP, 01/16/2022. FLUOROSCOPY: Exposure Index (as provided by the fluoroscopic device): 31 mGy Kerma FINDINGS: Multiple, limited oblique planar images of the RIGHT upper quadrant obtained C-arm. Images demonstrating flexible endoscopy, biliary duct cannulation, retrograde cholangiogram and metallic biliary stent placement. Biliary ductal dilation with distal CBD obstruction is demonstrated. IMPRESSION: Fluoroscopic imaging for ERCP and metallic biliary stent placement. Biliary ductal dilation with distal CBD obstruction is demonstrated. For complete description of intra procedural findings, please see performing service dictation. Electronically Signed   By: Michaelle Birks M.D.   On:  01/18/2022 12:07    Scheduled Meds:  amLODipine  5 mg Oral Daily   feeding supplement  237 mL Oral TID BM   insulin aspart  0-6 Units Subcutaneous TID WC   multivitamin with minerals  1 tablet Oral Daily   Continuous Infusions:  lactated ringers 100 mL/hr at 01/19/22 1112     LOS: 2 days    Time spent: 35 mins   Treavor Blomquist, MD [facility] Available via Epic secure chat 7am-7pm After these hours, please refer to coverage provider listed on [email or web address] 01/19/2022, 1:44 PM

## 2022-01-19 NOTE — Plan of Care (Signed)

## 2022-01-19 NOTE — Care Management Important Message (Signed)
Important Message ? ?Patient Details  ?Name: Miranda Francis ?MRN: [id number] ?Date of Birth: [birth year] ? ? ?Medicare Important Message Given:  Yes ? ? ? ? ?Levada Dy  Brannon Levene-Martin ?01/19/2022, 3:06 PM ?

## 2022-01-20 ENCOUNTER — Encounter (HOSPITAL_COMMUNITY): Payer: Self-pay | Admitting: Internal Medicine

## 2022-01-20 ENCOUNTER — Encounter (HOSPITAL_COMMUNITY): Payer: Self-pay

## 2022-01-20 ENCOUNTER — Ambulatory Visit (HOSPITAL_COMMUNITY)
Admission: RE | Admit: 2022-01-20 | Discharge: 2022-01-20 | Disposition: A | Discharge status: Home / Self Care | Payer: Medicare HMO | Source: Ambulatory Visit | Attending: Geriatric Medicine | Admitting: Geriatric Medicine

## 2022-01-20 DIAGNOSIS — K831 Obstruction of bile duct: Secondary | ICD-10-CM

## 2022-01-20 DIAGNOSIS — K8689 Other specified diseases of pancreas: Secondary | ICD-10-CM | POA: Diagnosis not present

## 2022-01-20 DIAGNOSIS — K859 Acute pancreatitis without necrosis or infection, unspecified: Secondary | ICD-10-CM | POA: Diagnosis not present

## 2022-01-20 DIAGNOSIS — K9189 Other postprocedural complications and disorders of digestive system: Secondary | ICD-10-CM

## 2022-01-20 DIAGNOSIS — R748 Abnormal levels of other serum enzymes: Secondary | ICD-10-CM | POA: Diagnosis not present

## 2022-01-20 LAB — CBC
HCT: 25.2 % — ABNORMAL LOW (ref 36.0–46.0)
Hemoglobin: 8.9 g/dL — ABNORMAL LOW (ref 12.0–15.0)
MCH: 29.7 pg (ref 26.0–34.0)
MCHC: 35.3 g/dL (ref 30.0–36.0)
MCV: 84 fL (ref 80.0–100.0)
Platelets: 174 10*3/uL (ref 150–400)
RBC: 3 MIL/uL — ABNORMAL LOW (ref 3.87–5.11)
RDW: 32.2 % — ABNORMAL HIGH (ref 11.5–15.5)
WBC: 12.4 10*3/uL — ABNORMAL HIGH (ref 4.0–10.5)
nRBC: 0.2 % (ref 0.0–0.2)

## 2022-01-20 LAB — GLUCOSE, CAPILLARY
Glucose-Capillary: 153 mg/dL — ABNORMAL HIGH (ref 70–99)
Glucose-Capillary: 128 mg/dL — ABNORMAL HIGH (ref 70–99)
Glucose-Capillary: 149 mg/dL — ABNORMAL HIGH (ref 70–99)
Glucose-Capillary: 143 mg/dL — ABNORMAL HIGH (ref 70–99)

## 2022-01-20 LAB — COMPREHENSIVE METABOLIC PANEL
ALT: 517 U/L — ABNORMAL HIGH (ref 0–44)
AST: 506 U/L — ABNORMAL HIGH (ref 15–41)
Albumin: 2.1 g/dL — ABNORMAL LOW (ref 3.5–5.0)
Alkaline Phosphatase: 1030 U/L — ABNORMAL HIGH (ref 38–126)
Anion gap: 8 (ref 5–15)
BUN: 16 mg/dL (ref 8–23)
CO2: 21 mmol/L — ABNORMAL LOW (ref 22–32)
Calcium: 8.8 mg/dL — ABNORMAL LOW (ref 8.9–10.3)
Chloride: 103 mmol/L (ref 98–111)
Creatinine, Ser: 0.85 mg/dL (ref 0.44–1.00)
GFR, Estimated: >60 mL/min (ref >60)
Glucose, Bld: 156 mg/dL — ABNORMAL HIGH (ref 70–99)
Potassium: 3.5 mmol/L (ref 3.5–5.1)
Sodium: 132 mmol/L — ABNORMAL LOW (ref 135–145)
Total Bilirubin: 26.5 mg/dL (ref 0.3–1.2)
Total Protein: 5.5 g/dL — ABNORMAL LOW (ref 6.5–8.1)

## 2022-01-20 LAB — PHOSPHORUS: Phosphorus: 4.5 mg/dL (ref 2.5–4.6)

## 2022-01-20 LAB — MAGNESIUM: Magnesium: 1.8 mg/dL (ref 1.7–2.4)

## 2022-01-20 NOTE — Progress Notes (Signed)
Progress Note    Miranda Francis  FGH:[id number] DOB: [birth year]  DOA: 01/16/2022 PCP: [name], MD      Brief Narrative:    Medical records reviewed and are as summarized below:  Miranda Francis is a 76 year old female with PMH significant for essential hypertension, type 2 diabetes mellitus who presented to [facility] ED on 3/7 for progressive skin changes, poor appetite and weight loss.  Patient was recently referred to  gastroenterologist with recent labs concerning for elevated transaminases and bilirubin and was referred to the ED for further evaluation.  She complained of poor oral intake and about 30 pound weight loss   In the ED,  Labs includes Alkaline phosphatase 959, lipase 45, AST 269, ALT 343, total bilirubin 39.3.  Ammonia level 46.  INR 1.3.  Tylenol level less than 10.  COVID-19 PCR negative.  Urinalysis unrevealing.  CT abdomen/pelvis with marked intra and extrahepatic biliary dilation with marked dilation of the pancreatic duct and distended gallbladder,  ill-defined soft tissue mass at the pancreatic head/neck region concerning for neoplasm/pancreatic carcinoma.  GI was consulted , underwent ERCP found to have a mass in the pancreatic head, fine-needle aspiration completed. Post ERCP lipase 1600, consistent with post ERCP pancreatitis.        Assessment/Plan:   Principal Problem:   Pancreatic mass Active Problems:   DM (diabetes mellitus) (HCC)   HTN (hypertension)   Jaundice   Malnutrition of moderate degree   Post-ERCP acute pancreatitis   Nutrition Problem: Moderate Malnutrition Etiology: chronic illness (suspected pancreatic cancer)  Signs/Symptoms: mild fat depletion, mild muscle depletion   Body mass index is 28.31 kg/m.   Pancreatic mass suspicious for malignancy: S/p ERCP with sphincterotomy and stent placement into the common bile duct.  Awaiting pathology results  Post ERCP acute pancreatitis: Continue n.p.o. status.  Continue IV  fluids and analgesics as needed for pain.  Elevated liver enzymes: This is likely from pancreatic mass  Other comorbidities include hypertension, type II DM    Diet Order             Diet NPO time specified  Diet effective now                     Consultants: Gastroenterologist  Procedures: ERCP    Medications:    amLODipine  5 mg Oral Daily   feeding supplement  237 mL Oral TID BM   insulin aspart  0-6 Units Subcutaneous TID WC   multivitamin with minerals  1 tablet Oral Daily   Continuous Infusions:  lactated ringers 100 mL/hr at 01/20/22 0805     Anti-infectives (From admission, onward)    None              Family Communication/Anticipated D/C date and plan/Code Status   DVT prophylaxis: SCDs Start: 01/17/22 0117     Code Status: Full Code  Family Communication: None Disposition Plan: Discharge   Status is: Inpatient Remains inpatient appropriate because: pancreatitis, abdominal pain       Subjective:   C/o abdominal pain  Objective:    Vitals:   01/19/22 2043 01/19/22 2135 01/20/22 0436 01/20/22 0800  BP: 124/66  95/66 113/65  Pulse: 67  80 80  Resp: 16   (!) 24  Temp: 100 F (37.8 C) 98.8 F (37.1 C) 98.3 F (36.8 C) 98.5 F (36.9 C)  TempSrc: Oral Oral Oral Oral  SpO2: 100%  97% 98%  Weight:  Height:       No data found.   Intake/Output Summary (Last 24 hours) at 01/20/2022 1525 Last data filed at 01/20/2022 0300 Gross per 24 hour  Intake 1479.96 ml  Output --  Net 1479.96 ml   Filed Weights   01/17/22 0404  Weight: 72.5 kg    Exam:  GEN: NAD SKIN: Jaundice EYES: EOMI, icteric ENT: MMM CV: RRR PULM: CTA B ABD: soft, ND, upper abdominal tenderness, +BS CNS: AAO x 3, non focal EXT: No edema or tenderness        Data Reviewed:   I have personally reviewed following labs and imaging studies:  Labs: Labs show the following:   Basic Metabolic Panel: Recent Labs  Lab  01/16/22 1830 01/18/22 0050 01/19/22 0327 01/20/22 0344  NA 133* 135 134* 132*  K 3.5 3.6 3.3* 3.5  CL 103 107 101 103  CO2 21* 19* 23 21*  GLUCOSE 165* 131* 196* 156*  BUN '19 18 19 16  '$ CREATININE 0.80 1.01* 1.15* 0.85  CALCIUM 9.7 9.1 9.1 8.8*  MG  --   --  1.9 1.8  PHOS  --   --  5.5* 4.5   GFR Estimated Creatinine Clearance: 55.4 mL/min (by C-G formula based on SCr of 0.85 mg/dL). Liver Function Tests: Recent Labs  Lab 01/16/22 1830 01/18/22 0050 01/19/22 0327 01/20/22 0344  AST 269* 238* 527* 506*  ALT 343* 297* 460* 517*  ALKPHOS 959* 873* 884* 1,030*  BILITOT 39.3* 34.8* 29.7* 26.5*  PROT 6.8 6.0* 5.6* 5.5*  ALBUMIN 2.7* 2.3* 2.2* 2.1*   Recent Labs  Lab 01/16/22 2250 01/19/22 0327  LIPASE 45 1,688*   Recent Labs  Lab 01/16/22 2250  AMMONIA 46*   Coagulation profile Recent Labs  Lab 01/16/22 2250  INR 1.3*    CBC: Recent Labs  Lab 01/16/22 1830 01/19/22 0327 01/20/22 0344  WBC 7.6 9.3 12.4*  NEUTROABS 5.4  --   --   HGB 9.8* 8.7* 8.9*  HCT 28.3* 24.3* 25.2*  MCV 84.0 83.2 84.0  PLT 202 175 174   Cardiac Enzymes: No results for input(s): CKTOTAL, CKMB, CKMBINDEX, TROPONINI in the last 168 hours. BNP (last 3 results) No results for input(s): PROBNP in the last 8760 hours. CBG: Recent Labs  Lab 01/19/22 1132 01/19/22 1606 01/19/22 2134 01/20/22 0801 01/20/22 1200  GLUCAP 176* 171* 133* 153* 128*   D-Dimer: No results for input(s): DDIMER in the last 72 hours. Hgb A1c: No results for input(s): HGBA1C in the last 72 hours. Lipid Profile: No results for input(s): CHOL, HDL, LDLCALC, TRIG, CHOLHDL, LDLDIRECT in the last 72 hours. Thyroid function studies: No results for input(s): TSH, T4TOTAL, T3FREE, THYROIDAB in the last 72 hours.  Invalid input(s): FREET3 Anemia work up: No results for input(s): VITAMINB12, FOLATE, FERRITIN, TIBC, IRON, RETICCTPCT in the last 72 hours. Sepsis Labs: Recent Labs  Lab 01/16/22 1830  01/19/22 0327 01/20/22 0344  WBC 7.6 9.3 12.4*    Microbiology Recent Results (from the past 240 hour(s))  Resp Panel by RT-PCR (Flu A&B, Covid) Nasopharyngeal Swab     Status: None   Collection Time: 01/16/22 10:50 PM   Specimen: Nasopharyngeal Swab; Nasopharyngeal(NP) swabs in vial transport medium  Result Value Ref Range Status   SARS Coronavirus 2 by RT PCR NEGATIVE NEGATIVE Final    Comment: (NOTE) SARS-CoV-2 target nucleic acids are NOT DETECTED.  The SARS-CoV-2 RNA is generally detectable in upper respiratory specimens during the acute phase of infection. The lowest  concentration of SARS-CoV-2 viral copies this assay can detect is 138 copies/mL. A negative result does not preclude SARS-Cov-2 infection and should not be used as the sole basis for treatment or other patient management decisions. A negative result may occur with  improper specimen collection/handling, submission of specimen other than nasopharyngeal swab, presence of viral mutation(s) within the areas targeted by this assay, and inadequate number of viral copies(<138 copies/mL). A negative result must be combined with clinical observations, patient history, and epidemiological information. The expected result is Negative.  Fact Sheet for Patients:  EntrepreneurPulse.com.au  Fact Sheet for Healthcare Providers:  IncredibleEmployment.be  This test is no t yet approved or cleared by the [place] FDA and  has been authorized for detection and/or diagnosis of SARS-CoV-2 by FDA under an Emergency Use Authorization (EUA). This EUA will remain  in effect (meaning this test can be used) for the duration of the COVID-19 declaration under Section 564(b)(1) of the Act, 21 U.S.C.section 360bbb-3(b)(1), unless the authorization is terminated  or revoked sooner.       Influenza A by PCR NEGATIVE NEGATIVE Final   Influenza B by PCR NEGATIVE NEGATIVE Final    Comment:  (NOTE) The Xpert Xpress SARS-CoV-2/FLU/RSV plus assay is intended as an aid in the diagnosis of influenza from Nasopharyngeal swab specimens and should not be used as a sole basis for treatment. Nasal washings and aspirates are unacceptable for Xpert Xpress SARS-CoV-2/FLU/RSV testing.  Fact Sheet for Patients: EntrepreneurPulse.com.au  Fact Sheet for Healthcare Providers: IncredibleEmployment.be  This test is not yet approved or cleared by the [place] FDA and has been authorized for detection and/or diagnosis of SARS-CoV-2 by FDA under an Emergency Use Authorization (EUA). This EUA will remain in effect (meaning this test can be used) for the duration of the COVID-19 declaration under Section 564(b)(1) of the Act, 21 U.S.C. section 360bbb-3(b)(1), unless the authorization is terminated or revoked.  Performed at [facility], [place] [address], [place], [place] [address]     Procedures and diagnostic studies:  No results found.             LOS: 3 days   Tahjir Silveria  [facility]   Pager on [email or web address]. If 7PM-7AM, please contact night-coverage at [email or web address]     01/20/2022, 3:25 PM

## 2022-01-20 NOTE — Plan of Care (Signed)

## 2022-01-20 NOTE — Progress Notes (Addendum)
? ? ? ?  [facility] Progress Note ? ?CC:  I'm not feeling well ? ?Assessment / Plan: ?Pancreatic head mass presenting with obstructive jaundice now with post-ERCP/EUS pancreatitis. 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  A 1 cm sphincterotomy was created and then a 10 mm x 60 mm covered ?metallic stent was successfully placed. The EUS confirmed the mass adjacent to but not invading the SMV. Awaiting FNA results obtained during EUS. Yesterday, she was found to have a markedly elevated lipase and progressive symptoms consistent with post-procedure pancreatitis. Continue IV hydration, bowel rest, and pain control. Ice chips okay today with advance to sips of clears if tolerated. Pathology results and Ca-19-9 are pending.  ? ?Abnormal liver enzymes. Likely due to obstruction from pancreatic mass and acute pancreatitis. Continue to trend daily.  ? ?I am covering for Dr. Benson Norway who will be back 01/22/22.  ? ?Subjective: ?Not feeling well today. Mild, diffuse abdominal pain without nausea. Anorexia. No sitophobia but has no interest in eating. No bowel movement in over 3 days.   ? ?No family was present at the time of my evaluation. ? ? ?Objective:  ?Vital signs in last 24 hours: ?Temp:  [98.3 ?F (36.8 ?C)-100 ?F (37.8 ?C)] 98.5 ?F (36.9 ?C) (03/11 0800) ?Pulse Rate:  [62-80] 80 (03/11 0800) ?Resp:  [16-24] 24 (03/11 0800) ?BP: (95-124)/(65-66) 113/65 (03/11 0800) ?SpO2:  [97 %-100 %] 98 % (03/11 0800) ?Last BM Date : 01/17/22 ?General:   Alert, uncomfortable appearing, jaundice with scleral icterus ?Heart:  Regular rate and rhythm; no murmurs ?Pulm: Clear anteriorly; no wheezing ?Abdomen:  Soft. Diffusely tender. Slightly hypoactive bowel sounds. Nondistended. No rebound or guarding. ?LAD: No inguinal or umbilical LAD ?Extremities:  Without edema. ?Neurologic:  Alert and  oriented x4;  grossly normal neurologically. ?Psych:  Alert and cooperative. Normal mood and  affect. ? ? ?Lab Results: ?Recent Labs  ?  01/19/22 ?0327 01/20/22 ?7096  ?WBC 9.3 12.4*  ?HGB 8.7* 8.9*  ?HCT 24.3* 25.2*  ?PLT 175 174  ? ?BMET ?Recent Labs  ?  01/18/22 ?0050 01/19/22 ?0327 01/20/22 ?2836  ?NA 135 134* 132*  ?K 3.6 3.3* 3.5  ?CL 107 101 103  ?CO2 19* 23 21*  ?GLUCOSE 131* 196* 156*  ?BUN '18 19 16  '$ ?CREATININE 1.01* 1.15* 0.85  ?CALCIUM 9.1 9.1 8.8*  ? ?LFT ?Recent Labs  ?  01/20/22 ?6294  ?PROT 5.5*  ?ALBUMIN 2.1*  ?AST 506*  ?ALT 517*  ?ALKPHOS 1,030*  ?BILITOT 26.5*  ? ? ? ?DG ERCP ? ?Result Date: 01/18/2022 ?CLINICAL DATA:  Biliary obstruction. EXAM: ERCP COMPARISON:  MRI abdomen, 01/17/2022.  CT AP, 01/16/2022. FLUOROSCOPY: Exposure Index (as provided by the fluoroscopic device): 31 mGy Kerma FINDINGS: Multiple, limited oblique planar images of the RIGHT upper quadrant obtained C-arm. Images demonstrating flexible endoscopy, biliary duct cannulation, retrograde cholangiogram and metallic biliary stent placement. Biliary ductal dilation with distal CBD obstruction is demonstrated. IMPRESSION: Fluoroscopic imaging for ERCP and metallic biliary stent placement. Biliary ductal dilation with distal CBD obstruction is demonstrated. For complete description of intra procedural findings, please see performing service dictation. Electronically Signed   By: Michaelle Birks M.D.   On: 01/18/2022 12:07   ? ? ? ? LOS: 3 days  ? ?[place]  01/20/2022, 9:30 AM ? ?  ?

## 2022-01-21 DIAGNOSIS — R17 Unspecified jaundice: Secondary | ICD-10-CM | POA: Diagnosis not present

## 2022-01-21 DIAGNOSIS — K8689 Other specified diseases of pancreas: Secondary | ICD-10-CM | POA: Diagnosis not present

## 2022-01-21 DIAGNOSIS — L298 Other pruritus: Secondary | ICD-10-CM

## 2022-01-21 DIAGNOSIS — K9189 Other postprocedural complications and disorders of digestive system: Secondary | ICD-10-CM | POA: Diagnosis not present

## 2022-01-21 DIAGNOSIS — K859 Acute pancreatitis without necrosis or infection, unspecified: Secondary | ICD-10-CM | POA: Diagnosis not present

## 2022-01-21 LAB — CBC WITH DIFFERENTIAL/PLATELET
Abs Immature Granulocytes: 0.14 K/uL — ABNORMAL HIGH (ref 0.00–0.07)
Basophils Absolute: 0.1 K/uL (ref 0.0–0.1)
Basophils Relative: 0 %
Eosinophils Absolute: 0 K/uL (ref 0.0–0.5)
Eosinophils Relative: 0 %
HCT: 25.1 % — ABNORMAL LOW (ref 36.0–46.0)
Hemoglobin: 8.7 g/dL — ABNORMAL LOW (ref 12.0–15.0)
Immature Granulocytes: 1 %
Lymphocytes Relative: 12 %
Lymphs Abs: 1.6 K/uL (ref 0.7–4.0)
MCH: 29.6 pg (ref 26.0–34.0)
MCHC: 34.7 g/dL (ref 30.0–36.0)
MCV: 85.4 fL (ref 80.0–100.0)
Monocytes Absolute: 0.9 K/uL (ref 0.1–1.0)
Monocytes Relative: 6 %
Neutro Abs: 10.6 K/uL — ABNORMAL HIGH (ref 1.7–7.7)
Neutrophils Relative %: 81 %
Platelets: 182 K/uL (ref 150–400)
RBC: 2.94 MIL/uL — ABNORMAL LOW (ref 3.87–5.11)
RDW: 31.6 % — ABNORMAL HIGH (ref 11.5–15.5)
Smear Review: ADEQUATE
WBC: 13.3 K/uL — ABNORMAL HIGH (ref 4.0–10.5)
nRBC: 0.4 % — ABNORMAL HIGH (ref 0.0–0.2)

## 2022-01-21 LAB — COMPREHENSIVE METABOLIC PANEL
ALT: 795 U/L — ABNORMAL HIGH (ref 0–44)
AST: 943 U/L — ABNORMAL HIGH (ref 15–41)
Albumin: 1.9 g/dL — ABNORMAL LOW (ref 3.5–5.0)
Alkaline Phosphatase: 1193 U/L — ABNORMAL HIGH (ref 38–126)
Anion gap: 10 (ref 5–15)
BUN: 16 mg/dL (ref 8–23)
CO2: 20 mmol/L — ABNORMAL LOW (ref 22–32)
Calcium: 8.6 mg/dL — ABNORMAL LOW (ref 8.9–10.3)
Chloride: 103 mmol/L (ref 98–111)
Creatinine, Ser: 0.91 mg/dL (ref 0.44–1.00)
GFR, Estimated: >60 mL/min (ref >60)
Glucose, Bld: 155 mg/dL — ABNORMAL HIGH (ref 70–99)
Potassium: 3.2 mmol/L — ABNORMAL LOW (ref 3.5–5.1)
Sodium: 133 mmol/L — ABNORMAL LOW (ref 135–145)
Total Bilirubin: 23.7 mg/dL (ref 0.3–1.2)
Total Protein: 5.1 g/dL — ABNORMAL LOW (ref 6.5–8.1)

## 2022-01-21 LAB — LIPASE, BLOOD: Lipase: 201 U/L — ABNORMAL HIGH (ref 11–51)

## 2022-01-21 MED ORDER — CHOLESTYRAMINE LIGHT 4 G PO PACK
4.0000 g | PACK | Freq: Two times a day (BID) | ORAL | Status: DC
  Administered 2022-01-21 – 2022-01-24 (×8): 4 g via ORAL
  Filled 2022-01-21 (×10): qty 1

## 2022-01-21 NOTE — Progress Notes (Signed)
Progress Note    Tajanae Guilbault  XTG:[id number] DOB: 1946-10-14  DOA: 01/16/2022 PCP: [name], MD      Brief Narrative:    Medical records reviewed and are as summarized below:  Miranda Francis is a 76 year old female with PMH significant for essential hypertension, type 2 diabetes mellitus who presented to [facility] ED on 3/7 for progressive skin changes, poor appetite and weight loss.  Patient was recently referred to  gastroenterologist with recent labs concerning for elevated transaminases and bilirubin and was referred to the ED for further evaluation.  She complained of poor oral intake and about 30 pound weight loss   In the ED,  Labs includes Alkaline phosphatase 959, lipase 45, AST 269, ALT 343, total bilirubin 39.3.  Ammonia level 46.  INR 1.3.  Tylenol level less than 10.  COVID-19 PCR negative.  Urinalysis unrevealing.  CT abdomen/pelvis with marked intra and extrahepatic biliary dilation with marked dilation of the pancreatic duct and distended gallbladder,  ill-defined soft tissue mass at the pancreatic head/neck region concerning for neoplasm/pancreatic carcinoma.  GI was consulted , underwent ERCP found to have a mass in the pancreatic head, fine-needle aspiration completed. Post ERCP lipase 1600, consistent with post ERCP pancreatitis.        Assessment/Plan:   Principal Problem:   Pancreatic mass Active Problems:   DM (diabetes mellitus) (HCC)   HTN (hypertension)   Jaundice   Malnutrition of moderate degree   Post-ERCP acute pancreatitis   Nutrition Problem: Moderate Malnutrition Etiology: chronic illness (suspected pancreatic cancer)  Signs/Symptoms: mild fat depletion, mild muscle depletion   Body mass index is 28.31 kg/m.   Pancreatic mass suspicious for malignancy: S/p ERCP with sphincterotomy and stent placement into the common bile duct.  Awaiting pathology results  Post ERCP acute pancreatitis: Slowly improving.  Diet has been  advanced to clear liquid diet.  Continue analgesics as needed for pain.  Discontinue IV fluids.   Elevated liver enzymes: This is likely from pancreatic mass.  Liver enzymes are increasing.  Plan for possible repeat abdominal imaging tomorrow if liver enzymes continue to increase.  Follow-up with GI for further recommendations.    Other comorbidities include hypertension, type II DM    Diet Order             Diet clear liquid Room service appropriate? Yes; Fluid consistency: Thin  Diet effective now                     Consultants: Gastroenterologist  Procedures: ERCP    Medications:    amLODipine  5 mg Oral Daily   cholestyramine light  4 g Oral BID   feeding supplement  237 mL Oral TID BM   insulin aspart  0-6 Units Subcutaneous TID WC   multivitamin with minerals  1 tablet Oral Daily   Continuous Infusions:     Anti-infectives (From admission, onward)    None              Family Communication/Anticipated D/C date and plan/Code Status   DVT prophylaxis: SCDs Start: 01/17/22 0117     Code Status: Full Code  Family Communication: None Disposition Plan: Discharge   Status is: Inpatient Remains inpatient appropriate because: pancreatitis, abdominal pain       Subjective:   She complains of abdominal pain.  No vomiting or diarrhea.  Objective:    Vitals:   01/20/22 2048 01/21/22 0529 01/21/22 0838 01/21/22 1345  BP: 103/66 107/71  115/73 111/70  Pulse: 82 90 85 74  Resp: (!) '23 19 18   '$ Temp: 99 F (37.2 C) 98.3 F (36.8 C) 99.1 F (37.3 C) 98.1 F (36.7 C)  TempSrc: Oral Oral Oral Oral  SpO2: 97%  100% 99%  Weight:      Height:       No data found.  No intake or output data in the 24 hours ending 01/21/22 1347  Filed Weights   01/17/22 0404  Weight: 72.5 kg    Exam:  GEN: NAD SKIN: Jaundice EYES: Icteric ENT: MMM CV: RRR PULM: CTA B ABD: soft, ND, upper abdominal tenderness, +BS CNS: AAO x 3, non  focal EXT: No edema or tenderness        Data Reviewed:   I have personally reviewed following labs and imaging studies:  Labs: Labs show the following:   Basic Metabolic Panel: Recent Labs  Lab 01/16/22 1830 01/18/22 0050 01/19/22 0327 01/20/22 0344 01/21/22 0031  NA 133* 135 134* 132* 133*  K 3.5 3.6 3.3* 3.5 3.2*  CL 103 107 101 103 103  CO2 21* 19* 23 21* 20*  GLUCOSE 165* 131* 196* 156* 155*  BUN '19 18 19 16 16  '$ CREATININE 0.80 1.01* 1.15* 0.85 0.91  CALCIUM 9.7 9.1 9.1 8.8* 8.6*  MG  --   --  1.9 1.8  --   PHOS  --   --  5.5* 4.5  --    GFR Estimated Creatinine Clearance: 51.7 mL/min (by C-G formula based on SCr of 0.91 mg/dL). Liver Function Tests: Recent Labs  Lab 01/16/22 1830 01/18/22 0050 01/19/22 0327 01/20/22 0344 01/21/22 0031  AST 269* 238* 527* 506* 943*  ALT 343* 297* 460* 517* 795*  ALKPHOS 959* 873* 884* 1,030* 1,193*  BILITOT 39.3* 34.8* 29.7* 26.5* 23.7*  PROT 6.8 6.0* 5.6* 5.5* 5.1*  ALBUMIN 2.7* 2.3* 2.2* 2.1* 1.9*   Recent Labs  Lab 01/16/22 2250 01/19/22 0327 01/21/22 0031  LIPASE 45 1,688* 201*   Recent Labs  Lab 01/16/22 2250  AMMONIA 46*   Coagulation profile Recent Labs  Lab 01/16/22 2250  INR 1.3*    CBC: Recent Labs  Lab 01/16/22 1830 01/19/22 0327 01/20/22 0344 01/21/22 0031  WBC 7.6 9.3 12.4* 13.3*  NEUTROABS 5.4  --   --  10.6*  HGB 9.8* 8.7* 8.9* 8.7*  HCT 28.3* 24.3* 25.2* 25.1*  MCV 84.0 83.2 84.0 85.4  PLT 202 175 174 182   Cardiac Enzymes: No results for input(s): CKTOTAL, CKMB, CKMBINDEX, TROPONINI in the last 168 hours. BNP (last 3 results) No results for input(s): PROBNP in the last 8760 hours. CBG: Recent Labs  Lab 01/19/22 2134 01/20/22 0801 01/20/22 1200 01/20/22 1614 01/20/22 2107  GLUCAP 133* 153* 128* 149* 143*   D-Dimer: No results for input(s): DDIMER in the last 72 hours. Hgb A1c: No results for input(s): HGBA1C in the last 72 hours. Lipid Profile: No results for  input(s): CHOL, HDL, LDLCALC, TRIG, CHOLHDL, LDLDIRECT in the last 72 hours. Thyroid function studies: No results for input(s): TSH, T4TOTAL, T3FREE, THYROIDAB in the last 72 hours.  Invalid input(s): FREET3 Anemia work up: No results for input(s): VITAMINB12, FOLATE, FERRITIN, TIBC, IRON, RETICCTPCT in the last 72 hours. Sepsis Labs: Recent Labs  Lab 01/16/22 1830 01/19/22 0327 01/20/22 0344 01/21/22 0031  WBC 7.6 9.3 12.4* 13.3*    Microbiology Recent Results (from the past 240 hour(s))  Resp Panel by RT-PCR (Flu A&B, Covid) Nasopharyngeal Swab  Status: None   Collection Time: 01/16/22 10:50 PM   Specimen: Nasopharyngeal Swab; Nasopharyngeal(NP) swabs in vial transport medium  Result Value Ref Range Status   SARS Coronavirus 2 by RT PCR NEGATIVE NEGATIVE Final    Comment: (NOTE) SARS-CoV-2 target nucleic acids are NOT DETECTED.  The SARS-CoV-2 RNA is generally detectable in upper respiratory specimens during the acute phase of infection. The lowest concentration of SARS-CoV-2 viral copies this assay can detect is 138 copies/mL. A negative result does not preclude SARS-Cov-2 infection and should not be used as the sole basis for treatment or other patient management decisions. A negative result may occur with  improper specimen collection/handling, submission of specimen other than nasopharyngeal swab, presence of viral mutation(s) within the areas targeted by this assay, and inadequate number of viral copies(<138 copies/mL). A negative result must be combined with clinical observations, patient history, and epidemiological information. The expected result is Negative.  Fact Sheet for Patients:  EntrepreneurPulse.com.au  Fact Sheet for Healthcare Providers:  IncredibleEmployment.be  This test is no t yet approved or cleared by the [place] FDA and  has been authorized for detection and/or diagnosis of SARS-CoV-2 by FDA under  an Emergency Use Authorization (EUA). This EUA will remain  in effect (meaning this test can be used) for the duration of the COVID-19 declaration under Section 564(b)(1) of the Act, 21 U.S.C.section 360bbb-3(b)(1), unless the authorization is terminated  or revoked sooner.       Influenza A by PCR NEGATIVE NEGATIVE Final   Influenza B by PCR NEGATIVE NEGATIVE Final    Comment: (NOTE) The Xpert Xpress SARS-CoV-2/FLU/RSV plus assay is intended as an aid in the diagnosis of influenza from Nasopharyngeal swab specimens and should not be used as a sole basis for treatment. Nasal washings and aspirates are unacceptable for Xpert Xpress SARS-CoV-2/FLU/RSV testing.  Fact Sheet for Patients: EntrepreneurPulse.com.au  Fact Sheet for Healthcare Providers: IncredibleEmployment.be  This test is not yet approved or cleared by the [place] FDA and has been authorized for detection and/or diagnosis of SARS-CoV-2 by FDA under an Emergency Use Authorization (EUA). This EUA will remain in effect (meaning this test can be used) for the duration of the COVID-19 declaration under Section 564(b)(1) of the Act, 21 U.S.C. section 360bbb-3(b)(1), unless the authorization is terminated or revoked.  Performed at [facility], [place] [address] [place], [place] [address]     Procedures and diagnostic studies:  No results found.             LOS: 4 days   Avner Stroder  [facility]   Pager on [email or web address]. If 7PM-7AM, please contact night-coverage at [email or web address]     01/21/2022, 1:47 PM

## 2022-01-21 NOTE — Progress Notes (Signed)
? ? ? ?  [facility] Progress Note ? ?CC:  I'm not feeling well ? ?Assessment / Plan: ?Pancreatic head mass presenting with obstructive jaundice now with post-ERCP/EUS pancreatitis. 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 The EUS confirmed the mass adjacent to but not invading the SMV.  FNA and Ca-19-9 results are still pending. Symptoms of pancreatitis are improving with hydration, bowel rest, and pain control. Will advance to clear liquids today. .  ? ?Abnormal liver enzymes. Bilirubin has improved, but transaminases and alk phos have increased overnight. Will repeat abdominal imaging if liver enzymes are not improving tomorrow. Continue to trend daily.  ? ?Pruritus of cholestasis: Add cholestyramine.  ? ?I am covering for Dr. Benson Norway who will be back 01/22/22.  ? ?Subjective: ?Feeling a little better today. Would like a popsicle.  Not sleeping well due to diffuse pruritus. Urine remains dark.  ? ?No family was present at the time of my evaluation. ? ? ?Objective:  ?Vital signs in last 24 hours: ?Temp:  [98.3 ?F (36.8 ?C)-99 ?F (37.2 ?C)] 98.3 ?F (36.8 ?C) (03/12 0529) ?Pulse Rate:  [82-90] 90 (03/12 0529) ?Resp:  [19-23] 19 (03/12 0529) ?BP: (103-107)/(66-71) 107/71 (03/12 0529) ?SpO2:  [97 %] 97 % (03/11 2048) ?Last BM Date : 01/17/22 ?General:   Alert, uncomfortable appearing, jaundice with scleral icterus ?Abdomen:  Soft. Diffusely tender. Slightly hypoactive bowel sounds. Nondistended. No rebound or guarding. ?LAD: No inguinal or umbilical LAD ?Extremities:  Without edema. ?Neurologic:  Alert and  oriented x4;  grossly normal neurologically. ?Psych:  Alert and cooperative. Normal mood and affect. ? ? ?Lab Results: ?Recent Labs  ?  01/19/22 ?0327 01/20/22 ?1941 01/21/22 ?0031  ?WBC 9.3 12.4* 13.3*  ?HGB 8.7* 8.9* 8.7*  ?HCT 24.3* 25.2* 25.1*  ?PLT 175 174 182  ? ?BMET ?Recent Labs  ?   01/19/22 ?0327 01/20/22 ?7408 01/21/22 ?0031  ?NA 134* 132* 133*  ?K 3.3* 3.5 3.2*  ?CL 101 103 103  ?CO2 23 21* 20*  ?GLUCOSE 196* 156* 155*  ?BUN _0 ?CREATININE 1.15* 0.85 0.91  ?CALCIUM 9.1 8.8* 8.6*  ? ?LFT ?Recent Labs  ?  01/21/22 ?0031  ?PROT 5.1*  ?ALBUMIN 1.9*  ?AST 943*  ?ALT 795*  ?ALKPHOS 1,193*  ?BILITOT 23.7*  ? ? ? ? ? ? LOS: 4 days  ? ?[place]  01/21/2022, 8:03 AM ? ?  ?

## 2022-01-22 ENCOUNTER — Encounter (HOSPITAL_COMMUNITY): Payer: Self-pay | Admitting: Gastroenterology

## 2022-01-22 DIAGNOSIS — K8689 Other specified diseases of pancreas: Secondary | ICD-10-CM | POA: Diagnosis not present

## 2022-01-22 DIAGNOSIS — D649 Anemia, unspecified: Secondary | ICD-10-CM

## 2022-01-22 LAB — MAGNESIUM: Magnesium: 1.9 mg/dL (ref 1.7–2.4)

## 2022-01-22 LAB — COMPREHENSIVE METABOLIC PANEL
ALT: 700 U/L — ABNORMAL HIGH (ref 0–44)
AST: 549 U/L — ABNORMAL HIGH (ref 15–41)
Albumin: 1.8 g/dL — ABNORMAL LOW (ref 3.5–5.0)
Alkaline Phosphatase: 1338 U/L — ABNORMAL HIGH (ref 38–126)
Anion gap: 8 (ref 5–15)
BUN: 18 mg/dL (ref 8–23)
CO2: 25 mmol/L (ref 22–32)
Calcium: 8.5 mg/dL — ABNORMAL LOW (ref 8.9–10.3)
Chloride: 101 mmol/L (ref 98–111)
Creatinine, Ser: 0.91 mg/dL (ref 0.44–1.00)
GFR, Estimated: >60 mL/min (ref >60)
Glucose, Bld: 136 mg/dL — ABNORMAL HIGH (ref 70–99)
Potassium: 3.1 mmol/L — ABNORMAL LOW (ref 3.5–5.1)
Sodium: 134 mmol/L — ABNORMAL LOW (ref 135–145)
Total Bilirubin: 21.4 mg/dL (ref 0.3–1.2)
Total Protein: 5 g/dL — ABNORMAL LOW (ref 6.5–8.1)

## 2022-01-22 LAB — CBC WITH DIFFERENTIAL/PLATELET
Abs Immature Granulocytes: 0.29 10*3/uL — ABNORMAL HIGH (ref 0.00–0.07)
Basophils Absolute: 0.1 10*3/uL (ref 0.0–0.1)
Basophils Relative: 0 %
Eosinophils Absolute: 0.1 10*3/uL (ref 0.0–0.5)
Eosinophils Relative: 1 %
HCT: 23.2 % — ABNORMAL LOW (ref 36.0–46.0)
Hemoglobin: 8.2 g/dL — ABNORMAL LOW (ref 12.0–15.0)
Immature Granulocytes: 3 %
Lymphocytes Relative: 14 %
Lymphs Abs: 1.6 10*3/uL (ref 0.7–4.0)
MCH: 30.3 pg (ref 26.0–34.0)
MCHC: 35.3 g/dL (ref 30.0–36.0)
MCV: 85.6 fL (ref 80.0–100.0)
Monocytes Absolute: 0.7 10*3/uL (ref 0.1–1.0)
Monocytes Relative: 6 %
Neutro Abs: 8.6 10*3/uL — ABNORMAL HIGH (ref 1.7–7.7)
Neutrophils Relative %: 76 %
Platelets: 177 10*3/uL (ref 150–400)
RBC: 2.71 MIL/uL — ABNORMAL LOW (ref 3.87–5.11)
RDW: 31.8 % — ABNORMAL HIGH (ref 11.5–15.5)
Smear Review: ADEQUATE
WBC: 11.3 10*3/uL — ABNORMAL HIGH (ref 4.0–10.5)
nRBC: 0.7 % — ABNORMAL HIGH (ref 0.0–0.2)

## 2022-01-22 LAB — CANCER ANTIGEN 19-9: CA 19-9: 9561 U/mL — ABNORMAL HIGH (ref 0–35)

## 2022-01-22 LAB — GLUCOSE, CAPILLARY
Glucose-Capillary: 134 mg/dL — ABNORMAL HIGH (ref 70–99)
Glucose-Capillary: 147 mg/dL — ABNORMAL HIGH (ref 70–99)
Glucose-Capillary: 175 mg/dL — ABNORMAL HIGH (ref 70–99)
Glucose-Capillary: 159 mg/dL — ABNORMAL HIGH (ref 70–99)
Glucose-Capillary: 146 mg/dL — ABNORMAL HIGH (ref 70–99)
Glucose-Capillary: 204 mg/dL — ABNORMAL HIGH (ref 70–99)

## 2022-01-22 LAB — LIPASE, BLOOD: Lipase: 84 U/L — ABNORMAL HIGH (ref 11–51)

## 2022-01-22 MED ORDER — POTASSIUM CHLORIDE CRYS ER 20 MEQ PO TBCR
40.0000 meq | EXTENDED_RELEASE_TABLET | Freq: Once | ORAL | Status: AC
  Administered 2022-01-22: 40 meq via ORAL
  Filled 2022-01-22: qty 2

## 2022-01-22 MED ORDER — GLUCERNA SHAKE PO LIQD
237.0000 mL | Freq: Three times a day (TID) | ORAL | Status: DC
Start: 2022-01-22 — End: 2022-01-25
  Administered 2022-01-22 – 2022-01-24 (×6): 237 mL via ORAL

## 2022-01-22 NOTE — Progress Notes (Signed)
PROGRESS NOTE  Fred Hammes  JME:[id number] DOB: [birth year] DOA: 01/16/2022 PCP: [name], MD   Brief Narrative: Krupa Stege is a 76 year old female with PMH significant for essential hypertension, type 2 diabetes mellitus who presented to [facility] ED on 3/7 for progressive skin changes, poor appetite and weight loss.  Patient was recently referred to  gastroenterologist with recent labs concerning for elevated transaminases and bilirubin and was referred to the ED for further evaluation.  Patient reports over the last month she has been eating very minimally, lost roughly 30 pounds. In the ED,  Labs includes Alkaline phosphatase 959, lipase 45, AST 269, ALT 343, total bilirubin 39.3.  Ammonia level 46.  INR 1.3.  Tylenol level less than 10.  COVID-19 PCR negative.  Urinalysis unrevealing.  CT abdomen/pelvis with marked intra and extrahepatic biliary dilation with marked dilation of the pancreatic duct and distended gallbladder,  ill-defined soft tissue mass at the pancreatic head/neck region concerning for neoplasm/pancreatic carcinoma. GI was consulted , underwent ERCP found to have a mass in the pancreatic head, fine-needle aspiration completed. Post ERCP lipase was 1600, consistent with post ERCP pancreatitis.  GI following    Assessment & Plan:  Principal Problem:   Pancreatic mass Active Problems:   Post-ERCP acute pancreatitis   DM (diabetes mellitus) (HCC)   Elevated liver enzymes   HTN (hypertension)   Hypokalemia   Weakness generalized   Malnutrition of moderate degree   Normocytic anemia   Assessment and Plan: * Pancreatic mass Patient presented with progressive weight loss 30 pounds+, poor appetite and skin discoloration over last 1 month.  She was referred to outpatient gastroenterology and has labs performed and due to abnormality she was sent in the ED for further evaluation. Significant LFT, bilirubin and alk phos elevation on admission.   CT abdomen/pelvis  with marked intra and extrahepatic biliary dilation with marked dilation of the pancreatic duct and distended gallbladder,  ill-defined soft tissue mass at the pancreatic head/neck region  concerning for neoplasm/pancreatic carcinoma. Gastroenterology consulted.  Patient underwent ERCP. She was found to have a mass in the pancreatic head.  Biopsy done/Result pending.   Post-ERCP acute pancreatitis S/p ERCP,she developed RUQ pain,Lipase 1600 consistent with post ERCP pancreatitis Abd pain has improved.We will advance diet to full liquid  Elevated liver enzymes Likely secondary to pancreatic mass, post ERCP pancreatitis.  Elevated ALP, AST, ALT.  Continue monitoring.  Lipase level has significantly improved but has severe hyperbilirubinemia  DM (diabetes mellitus) (HCC) HbA1c 4.6.  Well-controlled Diet controlled.  Currently on sliding scale insulin  HTN (hypertension) Currently normotensive on amlodipine.  Normocytic anemia Likely secondary to malignancy.  Continue monitoring.  Hemoglobin stable in the range of 8  Weakness generalized We will consult physical therapy   Hypokalemia Being monitored and supplemented as needed         Nutrition Problem: Moderate Malnutrition Etiology: chronic illness (suspected pancreatic cancer)    DVT prophylaxis:SCDs Start: 01/17/22 0117     Code Status: Full Code  Family Communication: None at bedside  Patient status:Inpatient  Patient is from :Home  Anticipated discharge IW:LNLG  Estimated DC date:in next 1-2 days, pending biopsy, needs to advance diet, waiting for improvement of liver enzymes   Consultants: Gastroenterology  Procedures: ERCP  Antimicrobials:  Anti-infectives (From admission, onward)    None       Subjective:  Patient seen and examined at the bedside this morning.  Hemodynamically stable.  Did not complain of any nausea, vomiting or worsening  abdominal pain today.  Has severe jaundice.  Ready for  advancement of diet.  Objective: Vitals:   01/21/22 1345 01/21/22 1938 01/22/22 0527 01/22/22 0824  BP: 111/70 112/73 129/62 120/71  Pulse: 74   71  Resp: $Remo'18 16 16 17  'rBgrw$ Temp: 98.1 F (36.7 C) 98 F (36.7 C) 98.4 F (36.9 C) 98.8 F (37.1 C)  TempSrc: Oral Oral Oral Oral  SpO2: 99% 96% 98% 100%  Weight:      Height:       No intake or output data in the 24 hours ending 01/22/22 1011 Filed Weights   01/17/22 0404  Weight: 72.5 kg    Examination:  General exam: Overall comfortable, not in distress, pleasant female HEENT: Icterus Respiratory system:  no wheezes or crackles  Cardiovascular system: S1 & S2 heard, RRR.  Gastrointestinal system: Abdomen is nondistended, soft and mostly nontender. Central nervous system: Alert and oriented Extremities: No edema, no clubbing ,no cyanosis Skin: Severe icterus   Data Reviewed: I have personally reviewed following labs and imaging studies  CBC: Recent Labs  Lab 01/16/22 1830 01/19/22 0327 01/20/22 0344 01/21/22 0031 01/22/22 0411  WBC 7.6 9.3 12.4* 13.3* 11.3*  NEUTROABS 5.4  --   --  10.6* 8.6*  HGB 9.8* 8.7* 8.9* 8.7* 8.2*  HCT 28.3* 24.3* 25.2* 25.1* 23.2*  MCV 84.0 83.2 84.0 85.4 85.6  PLT 202 175 174 182 884   Basic Metabolic Panel: Recent Labs  Lab 01/18/22 0050 01/19/22 0327 01/20/22 0344 01/21/22 0031 01/22/22 0411  NA 135 134* 132* 133* 134*  K 3.6 3.3* 3.5 3.2* 3.1*  CL 107 101 103 103 101  CO2 19* 23 21* 20* 25  GLUCOSE 131* 196* 156* 155* 136*  BUN $Re'18 19 16 16 18  'Bsa$ CREATININE 1.01* 1.15* 0.85 0.91 0.91  CALCIUM 9.1 9.1 8.8* 8.6* 8.5*  MG  --  1.9 1.8  --  1.9  PHOS  --  5.5* 4.5  --   --      Recent Results (from the past 240 hour(s))  Resp Panel by RT-PCR (Flu A&B, Covid) Nasopharyngeal Swab     Status: None   Collection Time: 01/16/22 10:50 PM   Specimen: Nasopharyngeal Swab; Nasopharyngeal(NP) swabs in vial transport medium  Result Value Ref Range Status   SARS Coronavirus 2 by RT PCR  NEGATIVE NEGATIVE Final    Comment: (NOTE) SARS-CoV-2 target nucleic acids are NOT DETECTED.  The SARS-CoV-2 RNA is generally detectable in upper respiratory specimens during the acute phase of infection. The lowest concentration of SARS-CoV-2 viral copies this assay can detect is 138 copies/mL. A negative result does not preclude SARS-Cov-2 infection and should not be used as the sole basis for treatment or other patient management decisions. A negative result may occur with  improper specimen collection/handling, submission of specimen other than nasopharyngeal swab, presence of viral mutation(s) within the areas targeted by this assay, and inadequate number of viral copies(<138 copies/mL). A negative result must be combined with clinical observations, patient history, and epidemiological information. The expected result is Negative.  Fact Sheet for Patients:  EntrepreneurPulse.com.au  Fact Sheet for Healthcare Providers:  IncredibleEmployment.be  This test is no t yet approved or cleared by the [place] FDA and  has been authorized for detection and/or diagnosis of SARS-CoV-2 by FDA under an Emergency Use Authorization (EUA). This EUA will remain  in effect (meaning this test can be used) for the duration of the COVID-19 declaration under Section 564(b)(1) of the  Act, 21 U.S.C.section 360bbb-3(b)(1), unless the authorization is terminated  or revoked sooner.       Influenza A by PCR NEGATIVE NEGATIVE Final   Influenza B by PCR NEGATIVE NEGATIVE Final    Comment: (NOTE) The Xpert Xpress SARS-CoV-2/FLU/RSV plus assay is intended as an aid in the diagnosis of influenza from Nasopharyngeal swab specimens and should not be used as a sole basis for treatment. Nasal washings and aspirates are unacceptable for Xpert Xpress SARS-CoV-2/FLU/RSV testing.  Fact Sheet for Patients: EntrepreneurPulse.com.au  Fact Sheet for  Healthcare Providers: IncredibleEmployment.be  This test is not yet approved or cleared by the [place] FDA and has been authorized for detection and/or diagnosis of SARS-CoV-2 by FDA under an Emergency Use Authorization (EUA). This EUA will remain in effect (meaning this test can be used) for the duration of the COVID-19 declaration under Section 564(b)(1) of the Act, 21 U.S.C. section 360bbb-3(b)(1), unless the authorization is terminated or revoked.  Performed at [facility], [place] [address] [place], [place] [address]      Radiology Studies: No results found.  Scheduled Meds:  amLODipine  5 mg Oral Daily   cholestyramine light  4 g Oral BID   feeding supplement (GLUCERNA SHAKE)  237 mL Oral TID BM   insulin aspart  0-6 Units Subcutaneous TID WC   multivitamin with minerals  1 tablet Oral Daily   Continuous Infusions:   LOS: 5 days   Shelly Coss, MD [facility] P3/13/2023, 10:11 AM

## 2022-01-22 NOTE — Assessment & Plan Note (Addendum)
No follow-up recommended by PT

## 2022-01-22 NOTE — Progress Notes (Signed)
Subjective: ?Patient seems to be doing much better today abdominal pain is resolved she is tolerating clears well. She denies having any nausea vomiting. ERCP revealed a pancreatic head mass suspicious for malignancy.  Post ERCP lipase level was 1600.  Biopsy results are pending.Marland Kitchen ?Objective: ?Vital signs in last 24 hours: ?Temp:  [98 ?F (36.7 ?C)-99.1 ?F (37.3 ?C)] 98.4 ?F (36.9 ?C) (03/13 4315) ?Pulse Rate:  [74-85] 74 (03/12 1345) ?Resp:  [16-18] 16 (03/13 0527) ?BP: (111-129)/(62-73) 129/62 (03/13 0527) ?SpO2:  [96 %-100 %] 98 % (03/13 0527) ?Last BM Date : 01/17/22 ? ?Intake/Output from previous day: ?No intake/output data recorded. ?Intake/Output this shift: ?No intake/output data recorded. ? ?General appearance: alert, cooperative, fatigued, icteric, and no distress ?Resp: clear to auscultation bilaterally ?Cardio: regular rate and rhythm, S1, S2 normal, no murmur, click, rub or gallop ?GI: soft, non-tender; bowel sounds normal; no masses,  no organomegaly ? ?Lab Results: ?Recent Labs  ?  01/20/22 ?4008 01/21/22 ?0031 01/22/22 ?0411  ?WBC 12.4* 13.3* 11.3*  ?HGB 8.9* 8.7* 8.2*  ?HCT 25.2* 25.1* 23.2*  ?PLT 174 182 177  ? ?BMET ?Recent Labs  ?  01/20/22 ?6761 01/21/22 ?0031 01/22/22 ?0411  ?NA 132* 133* 134*  ?K 3.5 3.2* 3.1*  ?CL 103 103 101  ?CO2 21* 20* 25  ?GLUCOSE 156* 155* 136*  ?BUN '16 16 18  '$ ?CREATININE 0.85 0.91 0.91  ?CALCIUM 8.8* 8.6* 8.5*  ? ?LFT ?Recent Labs  ?  01/22/22 ?0411  ?PROT 5.0*  ?ALBUMIN 1.8*  ?AST 549*  ?ALT 700*  ?ALKPHOS 1,338*  ?BILITOT 21.4*  ? ? ?Studies/Results: ?No results found. ? ?Medications: I have reviewed the patient's current medications. ?Prior to Admission:  ?Medications Prior to Admission  ?Medication Sig Dispense Refill Last Dose  ? acetaminophen (TYLENOL) 500 MG tablet Take 1,000 mg by mouth every 6 (six) hours as needed for moderate pain or headache.   Past Week  ? amLODipine (NORVASC) 5 MG tablet Take 5 mg by mouth daily.   01/16/2022  ? aspirin 81 MG chewable tablet  Chew 81 mg by mouth daily.     01/15/2022  ? Cholecalciferol (QC VITAMIN D3) 50 MCG (2000 UT) TABS Take 2,000 Units by mouth daily.   01/15/2022  ? glipiZIDE (GLUCOTROL) 5 MG tablet Take 2.5 mg by mouth 2 (two) times daily with a meal.   01/16/2022  ? ibuprofen (ADVIL) 200 MG tablet Take 400 mg by mouth every 6 (six) hours as needed for moderate pain or headache.   Past Month  ? KLOR-CON M20 20 MEQ tablet Take 20 mEq by mouth 2 (two) times daily.   01/16/2022  ? losartan (COZAAR) 100 MG tablet Take 1 tablet (100 mg total) by mouth daily. 30 tablet 0 01/16/2022  ? ?Scheduled: ? amLODipine  5 mg Oral Daily  ? cholestyramine light  4 g Oral BID  ? feeding supplement (GLUCERNA SHAKE)  237 mL Oral TID BM  ? insulin aspart  0-6 Units Subcutaneous TID WC  ? multivitamin with minerals  1 tablet Oral Daily  ? ?Continuous: ?PJK:[id number] (ZOFRAN) IV, oxyCODONE ? ?Assessment/Plan: ?1) Pancreatic head mass with dilated ducts and distended gallbladder/obstructive jaundice-suggestive of pancreatic adenocarcinoma awaiting results of the biopsies. ?Post ERCP pancreatitis-this seems to have resolved. ?2) AODM. ?3) Hypertension. ? ? LOS: 5 days  ? ?Aaliyah Cancro ?01/22/2022, 6:31 AM ? ? ?

## 2022-01-22 NOTE — Assessment & Plan Note (Signed)
Likely secondary to malignancy.  Continue monitoring.  Hemoglobin stable in the range of 8

## 2022-01-22 NOTE — Assessment & Plan Note (Signed)
Being monitored and supplemented as needed ?

## 2022-01-22 NOTE — Plan of Care (Signed)

## 2022-01-23 ENCOUNTER — Other Ambulatory Visit: Payer: Self-pay | Admitting: Oncology

## 2022-01-23 ENCOUNTER — Encounter (HOSPITAL_COMMUNITY): Payer: Self-pay | Admitting: Internal Medicine

## 2022-01-23 DIAGNOSIS — I10 Essential (primary) hypertension: Secondary | ICD-10-CM | POA: Diagnosis not present

## 2022-01-23 DIAGNOSIS — R7401 Elevation of levels of liver transaminase levels: Secondary | ICD-10-CM | POA: Diagnosis not present

## 2022-01-23 DIAGNOSIS — E119 Type 2 diabetes mellitus without complications: Secondary | ICD-10-CM | POA: Diagnosis not present

## 2022-01-23 DIAGNOSIS — K8689 Other specified diseases of pancreas: Secondary | ICD-10-CM | POA: Diagnosis not present

## 2022-01-23 LAB — CBC WITH DIFFERENTIAL/PLATELET
Abs Immature Granulocytes: 0.59 10*3/uL — ABNORMAL HIGH (ref 0.00–0.07)
Basophils Absolute: 0.1 10*3/uL (ref 0.0–0.1)
Basophils Relative: 1 %
Eosinophils Absolute: 0.1 10*3/uL (ref 0.0–0.5)
Eosinophils Relative: 1 %
HCT: 23.1 % — ABNORMAL LOW (ref 36.0–46.0)
Hemoglobin: 7.8 g/dL — ABNORMAL LOW (ref 12.0–15.0)
Immature Granulocytes: 5 %
Lymphocytes Relative: 17 %
Lymphs Abs: 1.9 10*3/uL (ref 0.7–4.0)
MCH: 29.8 pg (ref 26.0–34.0)
MCHC: 33.8 g/dL (ref 30.0–36.0)
MCV: 88.2 fL (ref 80.0–100.0)
Monocytes Absolute: 0.8 10*3/uL (ref 0.1–1.0)
Monocytes Relative: 7 %
Neutro Abs: 8 10*3/uL — ABNORMAL HIGH (ref 1.7–7.7)
Neutrophils Relative %: 69 %
Platelets: 191 10*3/uL (ref 150–400)
RBC: 2.62 MIL/uL — ABNORMAL LOW (ref 3.87–5.11)
RDW: 30.6 % — ABNORMAL HIGH (ref 11.5–15.5)
Smear Review: ADEQUATE
WBC: 11.5 10*3/uL — ABNORMAL HIGH (ref 4.0–10.5)
nRBC: 2.1 % — ABNORMAL HIGH (ref 0.0–0.2)

## 2022-01-23 LAB — COMPREHENSIVE METABOLIC PANEL
ALT: 459 U/L — ABNORMAL HIGH (ref 0–44)
AST: 206 U/L — ABNORMAL HIGH (ref 15–41)
Albumin: 1.8 g/dL — ABNORMAL LOW (ref 3.5–5.0)
Alkaline Phosphatase: 1126 U/L — ABNORMAL HIGH (ref 38–126)
Anion gap: 8 (ref 5–15)
BUN: 12 mg/dL (ref 8–23)
CO2: 24 mmol/L (ref 22–32)
Calcium: 8.4 mg/dL — ABNORMAL LOW (ref 8.9–10.3)
Chloride: 102 mmol/L (ref 98–111)
Creatinine, Ser: 0.74 mg/dL (ref 0.44–1.00)
GFR, Estimated: >60 mL/min (ref >60)
Glucose, Bld: 159 mg/dL — ABNORMAL HIGH (ref 70–99)
Potassium: 3.2 mmol/L — ABNORMAL LOW (ref 3.5–5.1)
Sodium: 134 mmol/L — ABNORMAL LOW (ref 135–145)
Total Bilirubin: 16.3 mg/dL — ABNORMAL HIGH (ref 0.3–1.2)
Total Protein: 5 g/dL — ABNORMAL LOW (ref 6.5–8.1)

## 2022-01-23 LAB — GLUCOSE, CAPILLARY
Glucose-Capillary: 131 mg/dL — ABNORMAL HIGH (ref 70–99)
Glucose-Capillary: 140 mg/dL — ABNORMAL HIGH (ref 70–99)
Glucose-Capillary: 142 mg/dL — ABNORMAL HIGH (ref 70–99)
Glucose-Capillary: 179 mg/dL — ABNORMAL HIGH (ref 70–99)

## 2022-01-23 MED ORDER — SODIUM CHLORIDE 0.9 % IV SOLN
INTRAVENOUS | Status: DC
Start: 2022-01-23 — End: 2022-01-24

## 2022-01-23 MED ORDER — POTASSIUM CHLORIDE CRYS ER 20 MEQ PO TBCR
40.0000 meq | EXTENDED_RELEASE_TABLET | Freq: Once | ORAL | Status: AC
Start: 2022-01-23 — End: 2022-01-23
  Administered 2022-01-23: 40 meq via ORAL
  Filled 2022-01-23: qty 2

## 2022-01-23 MED ORDER — MORPHINE SULFATE (PF) 2 MG/ML IV SOLN
2.0000 mg | INTRAVENOUS | Status: DC | PRN
  Administered 2022-01-23 (×2): 2 mg via INTRAVENOUS
  Filled 2022-01-23 (×2): qty 1

## 2022-01-23 NOTE — Progress Notes (Signed)
?PROGRESS NOTE ? ?Miranda Francis  LEX:[id number] DOB: [birth year] DOA: 01/16/2022 ?PCP: [name], MD  ? ?Brief Narrative: ?Miranda Francis is a 76 year old female with PMH significant for essential hypertension, type 2 diabetes mellitus who presented to [facility] ED on 3/7 for progressive skin changes, poor appetite and weight loss.  Patient was recently referred to  gastroenterologist with recent labs concerning for elevated transaminases and bilirubin and was referred to the ED for further evaluation.  Patient reports over the last month she has been eating very minimally, lost roughly 30 pounds. ?In the ED,  Labs includes Alkaline phosphatase 959, lipase 45, AST 269, ALT 343, total bilirubin 39.3.  Ammonia level 46.  INR 1.3.  Tylenol level less than 10.  COVID-19 PCR negative.  Urinalysis unrevealing.  CT abdomen/pelvis with marked intra and extrahepatic biliary dilation with marked dilation of the pancreatic duct and distended gallbladder,  ill-defined soft tissue mass at the pancreatic head/neck region concerning for neoplasm/pancreatic carcinoma. GI was consulted , underwent ERCP found to have a mass in the pancreatic head, fine-needle aspiration completed. ?Post ERCP lipase was 1600, consistent with post ERCP pancreatitis.  GI were following.  FNA confirmed pancreatic adenocarcinoma.  Oncology consulted today. ?  ? ?Assessment & Plan: ? ?Principal Problem: ?  Pancreatic mass ?Active Problems: ?  Post-ERCP acute pancreatitis ?  DM (diabetes mellitus) ([place]) ?  Elevated liver enzymes ?  HTN (hypertension) ?  Hypokalemia ?  Weakness generalized ?  Malnutrition of moderate degree ?  Normocytic anemia ? ? ?Assessment and Plan: ?* Pancreatic mass ?Patient presented with progressive weight loss 30 pounds+, poor appetite and skin discoloration over last 1 month.  She was referred to outpatient gastroenterology and has labs performed and due to abnormality she was sent in the ED for further evaluation. ?Significant  LFT, bilirubin and alk phos elevation on admission.   ?CT abdomen/pelvis with marked intra and extrahepatic biliary dilation with marked dilation of the pancreatic duct and distended gallbladder,  ill-defined soft tissue mass at the pancreatic head/neck region  concerning for neoplasm/pancreatic carcinoma. ?Gastroenterology consulted.  Patient underwent ERCP. She was found to have a mass in the pancreatic head.  FNA confirmed pancreatic adenocarcinoma.  Oncology consulted today for treatment options ? ? ?Post-ERCP acute pancreatitis ?S/p ERCP,she developed RUQ pain,Lipase 1600 consistent with post ERCP pancreatitis ?Abd pain has improved.Diet advanced to soft.  Her current abdominal pain is most likely contributed by pancreatic cancer ? ?Elevated liver enzymes ?Likely secondary to pancreatic mass, post ERCP pancreatitis.  Elevated ALP, AST, ALT.  Continue monitoring.  Lipase level has significantly improved ,Liver enzymes also improving along with bilirubin ? ?DM (diabetes mellitus) ([place]) ?HbA1c 4.6.  Well-controlled ?Diet controlled.  Currently on sliding scale insulin ? ?HTN (hypertension) ?Currently normotensive on amlodipine. ? ?Normocytic anemia ?Likely secondary to malignancy.  Continue monitoring.  Hemoglobin stable in the range of 7-8 ? ?Weakness generalized ?We will consult physical therapy  ? ?Hypokalemia ?Being monitored and supplemented as needed ? ? ? ? ?  ? ? ?Nutrition Problem: Moderate Malnutrition ?Etiology: chronic illness (suspected pancreatic cancer) ?  ? ?DVT prophylaxis:SCDs Start: 01/17/22 0117 ? ? ?  Code Status: Full Code ? ?Family Communication: None at bedside.  Patient is asking not to call any of her family members until she has discussion with oncology ? ?Patient status:Inpatient ? ?Patient is from :Home ? ?Anticipated discharge SW:HQPR ? ?Estimated DC date:in next 1-2 days, pending oncology recommendation ? ?Consultants: Gastroenterology ? ?Procedures: ERCP ? ?Antimicrobials:   ?  Anti-infectives (From admission, onward)  ? ? None  ? ?  ? ? ?Subjective: ? ?Patient seen and examined at the bedside this morning.  Hemodynamically stable.  Her abdomen pain is better today but she still has abdominal discomfort.  On examination there was no tenderness of the abdomen.  No nausea or vomiting.  I disclosed the report of biopsy confirming pancreatic cancer.  She was very emotional.  Provided emotional support.  Had a long discussion at bedside ? ?Objective: ?Vitals:  ? 01/22/22 1635 01/22/22 1921 01/23/22 0351 01/23/22 0758  ?BP: 120/70 109/74 127/73 116/75  ?Pulse: 77 80 65 72  ?Resp: $Remov'18 20 14 16  'BHVvUs$ ?Temp:  99 ?F (37.2 ?C) 98.6 ?F (37 ?C) 97.8 ?F (36.6 ?C)  ?TempSrc:  Oral  Oral  ?SpO2: 98% 98% 97% 96%  ?Weight:      ?Height:      ? ?No intake or output data in the 24 hours ending 01/23/22 1050 ?Filed Weights  ? 01/17/22 0404  ?Weight: 72.5 kg  ? ? ?Examination: ? ?General exam: Overall comfortable, pleasant female ?HEENT: PERRL ?Respiratory system:  no wheezes or crackles  ?Cardiovascular system: S1 & S2 heard, RRR.  ?Gastrointestinal system: Abdomen is nondistended, soft and nontender. ?Central nervous system: Alert and oriented ?Extremities: No edema, no clubbing ,no cyanosis ?Skin: No rashes, no ulcers, icteric ? ? ?Data Reviewed: I have personally reviewed following labs and imaging studies ? ?CBC: ?Recent Labs  ?Lab 01/16/22 ?1830 01/19/22 ?0327 01/20/22 ?7026 01/21/22 ?0031 01/22/22 ?0411 01/23/22 ?0220  ?WBC 7.6 9.3 12.4* 13.3* 11.3* 11.5*  ?NEUTROABS 5.4  --   --  10.6* 8.6* 8.0*  ?HGB 9.8* 8.7* 8.9* 8.7* 8.2* 7.8*  ?HCT 28.3* 24.3* 25.2* 25.1* 23.2* 23.1*  ?MCV 84.0 83.2 84.0 85.4 85.6 88.2  ?PLT 202 175 174 182 177 191  ? ?Basic Metabolic Panel: ?Recent Labs  ?Lab 01/19/22 ?0327 01/20/22 ?3785 01/21/22 ?0031 01/22/22 ?0411 01/23/22 ?0220  ?NA 134* 132* 133* 134* 134*  ?K 3.3* 3.5 3.2* 3.1* 3.2*  ?CL 101 103 103 101 102  ?CO2 23 21* 20* 25 24  ?GLUCOSE 196* 156* 155* 136* 159*  ?BUN $Rem'19 16  16 18 12  'tjzE$ ?CREATININE 1.15* 0.85 0.91 0.91 0.74  ?CALCIUM 9.1 8.8* 8.6* 8.5* 8.4*  ?MG 1.9 1.8  --  1.9  --   ?PHOS 5.5* 4.5  --   --   --   ? ? ? ?Recent Results (from the past 240 hour(s))  ?Resp Panel by RT-PCR (Flu A&B, Covid) Nasopharyngeal Swab     Status: None  ? Collection Time: 01/16/22 10:50 PM  ? Specimen: Nasopharyngeal Swab; Nasopharyngeal(NP) swabs in vial transport medium  ?Result Value Ref Range Status  ? SARS Coronavirus 2 by RT PCR NEGATIVE NEGATIVE Final  ?  Comment: (NOTE) ?SARS-CoV-2 target nucleic acids are NOT DETECTED. ? ?The SARS-CoV-2 RNA is generally detectable in upper respiratory ?specimens during the acute phase of infection. The lowest ?concentration of SARS-CoV-2 viral copies this assay can detect is ?138 copies/mL. A negative result does not preclude SARS-Cov-2 ?infection and should not be used as the sole basis for treatment or ?other patient management decisions. A negative result may occur with  ?improper specimen collection/handling, submission of specimen other ?than nasopharyngeal swab, presence of viral mutation(s) within the ?areas targeted by this assay, and inadequate number of viral ?copies(<138 copies/mL). A negative result must be combined with ?clinical observations, patient history, and epidemiological ?information. The expected result is Negative. ? ?Fact  Sheet for Patients:  ?EntrepreneurPulse.com.au ? ?Fact Sheet for Healthcare Providers:  ?IncredibleEmployment.be ? ?This test is no t yet approved or cleared by the [place] FDA and  ?has been authorized for detection and/or diagnosis of SARS-CoV-2 by ?FDA under an Emergency Use Authorization (EUA). This EUA will remain  ?in effect (meaning this test can be used) for the duration of the ?COVID-19 declaration under Section 564(b)(1) of the Act, 21 ?U.S.C.section 360bbb-3(b)(1), unless the authorization is terminated  ?or revoked sooner.  ? ? ?  ? Influenza A by PCR NEGATIVE  NEGATIVE Final  ? Influenza B by PCR NEGATIVE NEGATIVE Final  ?  Comment: (NOTE) ?The Xpert Xpress SARS-CoV-2/FLU/RSV plus assay is intended as an aid ?in the diagnosis of influenza from Nasopharyngeal swab specimen

## 2022-01-23 NOTE — Evaluation (Signed)
Physical Therapy Evaluation and Discharge ?Patient Details ?Name: Miranda Francis ?MRN: [id number] ?DOB: 16-Apr-1946 ?Today's Date: 01/23/2022 ? ?History of Present Illness ? Pt is a 76 y/o female admitted secondary to jaundice. Found to have pancreatic mass and is s/p ERCP and upper endoscopic [place]. PMH includes DM and HTN.  ?Clinical Impression ? Patient evaluated by Physical Therapy with no further acute PT needs identified. All education has been completed and the patient has no further questions. Pt overall steady with no LOB noted. Overall at a mod I to independent level. Educated about walking program and to continue working with mobility specialists to prevent deconditioning. See below for any follow-up Physical Therapy or equipment needs. PT is signing off. Thank you for this referral. ?   ?   ? ?Recommendations for follow up therapy are one component of a multi-disciplinary discharge planning process, led by the attending physician.  Recommendations may be updated based on patient status, additional functional criteria and insurance authorization. ? ?Follow Up Recommendations No PT follow up ? ?  ?Assistance Recommended at Discharge Intermittent Supervision/Assistance  ?Patient can return home with the following ? Assistance with cooking/housework ? ?  ?Equipment Recommendations None recommended by PT  ?Recommendations for Other Services ?    ?  ?Functional Status Assessment Patient has had a recent decline in their functional status and demonstrates the ability to make significant improvements in function in a reasonable and predictable amount of time.  ? ?  ?Precautions / Restrictions Precautions ?Precautions: None ?Restrictions ?Weight Bearing Restrictions: No  ? ?  ? ?Mobility ? Bed Mobility ?Overal bed mobility: Independent ?  ?  ?  ?  ?  ?  ?  ?  ? ?Transfers ?Overall transfer level: Independent ?  ?  ?  ?  ?  ?  ?  ?  ?  ?  ? ?Ambulation/Gait ?Ambulation/Gait assistance: Modified independent  (Device/Increase time) ?Gait Distance (Feet): 550 Feet ?Assistive device: IV Pole ?Gait Pattern/deviations: WFL(Within Functional Limits) ?Gait velocity: mildly decreased ?  ?  ?General Gait Details: Overall steady gait with no LOB noted. Educated about walking program to perform at home. ? ?Stairs ?  ?  ?  ?  ?  ? ?Wheelchair Mobility ?  ? ?Modified Rankin (Stroke Patients Only) ?  ? ?  ? ?Balance Overall balance assessment: No apparent balance deficits (not formally assessed) ?  ?  ?  ?  ?  ?  ?  ?  ?  ?  ?  ?  ?  ?  ?  ?  ?  ?  ?   ? ? ? ?Pertinent Vitals/Pain Pain Assessment ?Pain Assessment: Faces ?Faces Pain Scale: Hurts little more ?Pain Location: abdomen ?Pain Descriptors / Indicators: Guarding ?Pain Intervention(s): Limited activity within patient's tolerance, Monitored during session, Repositioned  ? ? ?Home Living Family/patient expects to be discharged to:: Private residence ?Living Arrangements: Spouse/significant other ?Available Help at Discharge: Family ?Type of Home: House ?Home Access: Level entry ?  ?  ?Alternate Level Stairs-Number of Steps: flight ?Home Layout: Two level ?Home Equipment: None ?   ?  ?Prior Function Prior Level of Function : Independent/Modified Independent ?  ?  ?  ?  ?  ?  ?  ?  ?  ? ? ?Hand Dominance  ?   ? ?  ?Extremity/Trunk Assessment  ? Upper Extremity Assessment ?Upper Extremity Assessment: Overall WFL for tasks assessed ?  ? ?Lower Extremity Assessment ?Lower Extremity Assessment: Overall WFL for tasks assessed ?  ? ?  Cervical / Trunk Assessment ?Cervical / Trunk Assessment: Normal  ?Communication  ? Communication: No difficulties  ?Cognition Arousal/Alertness: Awake/alert ?Behavior During Therapy: [facility] for tasks assessed/performed ?Overall Cognitive Status: Within Functional Limits for tasks assessed ?  ?  ?  ?  ?  ?  ?  ?  ?  ?  ?  ?  ?  ?  ?  ?  ?  ?  ?  ? ?  ?General Comments General comments (skin integrity, edema, etc.): Discussed safety with stairs at home and  continuing to work with mobility specialist to maintain independence with gait. ? ?  ?Exercises    ? ?Assessment/Plan  ?  ?PT Assessment Patient does not need any further PT services  ?PT Problem List   ? ?   ?  ?PT Treatment Interventions     ? ?PT Goals (Current goals can be found in the Care Plan section)  ?Acute Rehab PT Goals ?Patient Stated Goal: to go home ?PT Goal Formulation: With patient ?Time For Goal Achievement: 01/23/22 ?Potential to Achieve Goals: Good ? ?  ?Frequency   ?  ? ? ?Co-evaluation   ?  ?  ?  ?  ? ? ?  ?AM-PAC PT "6 Clicks" Mobility  ?Outcome Measure Help needed turning from your back to your side while in a flat bed without using bedrails?: None ?Help needed moving from lying on your back to sitting on the side of a flat bed without using bedrails?: None ?Help needed moving to and from a bed to a chair (including a wheelchair)?: None ?Help needed standing up from a chair using your arms (e.g., wheelchair or bedside chair)?: None ?Help needed to walk in hospital room?: None ?Help needed climbing 3-5 steps with a railing? : None ?6 Click Score: 24 ? ?  ?End of Session   ?Activity Tolerance: Patient tolerated treatment well ?Patient left: in bed;with call bell/phone within reach ?Nurse Communication: Mobility status ?PT Visit Diagnosis: Other abnormalities of gait and mobility (R26.89) ?  ? ?Time: 0347-4259 ?PT Time Calculation (min) (ACUTE ONLY): 14 min ? ? ?Charges:   PT Evaluation ?$PT Eval Low Complexity: 1 Low ?  ?  ?   ? ? ?Reuel Derby, PT, DPT  ?Acute Rehabilitation Services  ?Pager: [phone] ?Office: [phone] ? ? ?[place] ?01/23/2022, 4:41 PM ? ?

## 2022-01-23 NOTE — Discharge Planning (Addendum)
Oncology Discharge Planning Admission Note ? ?[place] at [facility] ?Address: [address], [place], [place] [address] ?Hours of Operation:  8am - 5pm, Monday - Friday  ?Clinic Contact Information:  [phone] ? ?Oncology Care Team: ?Medical Oncologist:  Dr. Zola Button ? ?Dr. Alen Blew is aware of this hospital admission dated 01/17/2022, and the cancer center will follow [place] inpatient care to assist with discharge planning as indicated by the oncologist.  We will arrange for outpatient follow up closer to discharge. ? ?Disclaimer:  This [place] note does not imply a formal consult request has been made by the admitting attending for this admission or there will be an inpatient consult completed by oncology.  Please request oncology consults as per standard process as indicated. ?

## 2022-01-23 NOTE — Progress Notes (Signed)
Mobility Specialist Progress Note  ? ? 01/23/22 1133  ?Mobility  ?Activity Ambulated independently in hallway  ?Level of Assistance Standby assist, set-up cues, supervision of patient - no hands on  ?Assistive Device  ?(IV pole)  ?Distance Ambulated (ft) 300 ft  ?Activity Response Tolerated well  ?$Mobility charge 1 Mobility  ? ?Pt received in bed and agreeable. Had dark amber void in BR. No complaints during. Upon return, while sitting EOB pt c/o feeling dizzy and was groaning in pain. Returned to bed with call bell in reach.  ? ?Hildred Alamin ?Mobility Specialist  ?M.S. 5N: [phone]  ?

## 2022-01-23 NOTE — Consult Note (Addendum)
[facility] [place]  ?Telephone:([phone] [phone]) [phone]  ? ?MEDICAL ONCOLOGY - INITIAL CONSULTATION ? ?Referral MD: Dr. Shelly Coss ? ?Reason for Referral: Pancreatic mass ? ?HPI: Miranda Francis is a 76 year old female with a past medical history significant for diabetes mellitus type 2, hypertension, heart murmur.  She was sent to the emergency department due to abnormal lab work.  Over the past month, she was having a decreased appetite with abdominal discomfort.  She has had a 30 pound weight loss over the past month.  She was referred to GI.  The GI office did lab work which showed an elevated bilirubin and she was referred to the emergency department.  On admission, her hemoglobin was 9.8, sodium 133, albumin 2.7, AST 269, ALT 343, alk phos 959, T. bili 39.3.  A CA 19.9 was obtained on admission which was elevated at 9561.  CT abdomen/pelvis with contrast showed marked intra and extrahepatic biliary dilatation with marked dilatation of the pancreatic duct and distended gallbladder, ill-defined soft tissue mass at the pancreatic head neck region with marked narrowing of the SMV by ill-defined mass.  An MRCP was performed which showed an obstructing mass in the pancreatic head with an intraluminal component demonstrating low-level enhancement, mass causes significant extrinsic narrowing at the portal/superior mesenteric vein confluence as well as marked intra and extrahepatic biliary dilatation.  No distant metastases identified.  She underwent ERCP and EUS on 01/18/2022.  1 covered metal stent was placed in the common bile duct.  Mass was identified at in the pancreatic head and fine-needle aspiration was performed.  According to a progress note from GI, findings suggestive of pancreatic adenocarcinoma but pathology results are pending at this time. ? ?The patient was seen in her hospital room.  No family at the bedside.  The patient reports that she has some ongoing epigastric pain.  She  has had a poor appetite with a 30 pound weight loss over the past month.  She has had yellowing of the eyes.  Reports that her stools are light in color.  She is not having any fevers, chills, headaches, dizziness, chest pain, shortness of breath, nausea, vomiting, bleeding, lower extremity edema.  The patient is married and lives in [place], [place].  She has 2 children.  Denies history of alcohol and tobacco use.  Family history significant for a sister with a history of colon cancer.  Medical oncology was asked to see the patient to make recommendations regarding her pancreatic mass. ? ?Past Medical History:  ?Diagnosis Date  ? Blood transfusion   ? Diabetes mellitus   ? Heart murmur   ? Hypertension   ?: ? ? ?Past Surgical History:  ?Procedure Laterality Date  ? BILIARY STENT PLACEMENT  01/18/2022  ? Procedure: BILIARY STENT PLACEMENT;  Surgeon: Carol Ada, MD;  Location: [facility];  Service: Gastroenterology;;  ? DILATION AND CURETTAGE OF UTERUS    ? ERCP N/A 01/18/2022  ? Procedure: ENDOSCOPIC RETROGRADE CHOLANGIOPANCREATOGRAPHY (ERCP);  Surgeon: Carol Ada, MD;  Location: [facility];  Service: Gastroenterology;  Laterality: N/A;  ? ESOPHAGOGASTRODUODENOSCOPY N/A 01/18/2022  ? Procedure: ESOPHAGOGASTRODUODENOSCOPY (EGD);  Surgeon: Carol Ada, MD;  Location: [place];  Service: Gastroenterology;  Laterality: N/A;  ? EUS  01/18/2022  ? Procedure: UPPER ENDOSCOPIC ULTRASOUND (EUS) LINEAR;  Surgeon: Carol Ada, MD;  Location: [facility];  Service: Gastroenterology;;  ? FINE NEEDLE ASPIRATION  01/18/2022  ? Procedure: FINE NEEDLE ASPIRATION (FNA) LINEAR;  Surgeon: Carol Ada, MD;  Location:  [facility];  Service: Gastroenterology;;  ? NO PAST SURGERIES    ? SPHINCTEROTOMY  01/18/2022  ? Procedure: SPHINCTEROTOMY;  Surgeon: Carol Ada, MD;  Location: [facility];  Service: Gastroenterology;;  ?: ? ? ?Current Facility-Administered Medications  ?Medication Dose Route Frequency Provider  Last Rate Last Admin  ? amLODipine (NORVASC) tablet 5 mg  5 mg Oral Daily Carol Ada, MD   5 mg at 01/23/22 0737  ? cholestyramine light (PREVALITE) packet 4 g  4 g Oral BID Thornton Park, MD   4 g at 01/23/22 1062  ? feeding supplement (GLUCERNA SHAKE) (GLUCERNA SHAKE) liquid 237 mL  237 mL Oral TID BM Shelly Coss, MD   237 mL at 01/23/22 0815  ? insulin aspart (novoLOG) injection 0-6 Units  0-6 Units Subcutaneous TID WC Carol Ada, MD   2 Units at 01/22/22 1434  ? multivitamin with minerals tablet 1 tablet  1 tablet Oral Daily Shawna Clamp, MD   1 tablet at 01/23/22 6948  ? ondansetron (ZOFRAN) injection 4 mg  4 mg Intravenous Q6H PRN Lovey Newcomer T, NP   4 mg at 01/18/22 2339  ? oxyCODONE (Oxy IR/ROXICODONE) immediate release tablet 5 mg  5 mg Oral Q3H PRN Shawna Clamp, MD   5 mg at 01/23/22 5462  ? ? ? ?No Known Allergies: ? ? ?Family History  ?Problem Relation Age of Onset  ? Diabetes type II Mother   ? Diabetes type II Sister   ?: ? ? ?Social History  ? ?Socioeconomic History  ? Marital status: Married  ?  Spouse name: Not on file  ? Number of children: Not on file  ? Years of education: Not on file  ? Highest education level: Not on file  ?Occupational History  ? Not on file  ?Tobacco Use  ? Smoking status: Never  ? Smokeless tobacco: Never  ?Substance and Sexual Activity  ? Alcohol use: Never  ? Drug use: Never  ? Sexual activity: Not on file  ?Other Topics Concern  ? Not on file  ?Social History Narrative  ? Not on file  ? ?Social Determinants of Health  ? ?Financial Resource Strain: Not on file  ?Food Insecurity: Not on file  ?Transportation Needs: Not on file  ?Physical Activity: Not on file  ?Stress: Not on file  ?Social Connections: Not on file  ?Intimate Partner Violence: Not on file  ?: ? ?Review of Systems: A comprehensive 14 point review of systems was negative except as noted in the HPI. ? ?Exam: ?Patient Vitals for the past 24 hrs: ? BP Temp Temp src Pulse Resp SpO2  ?01/23/22  0758 116/75 97.8 ?F (36.6 ?C) Oral 72 16 96 %  ?01/23/22 0351 127/73 98.6 ?F (37 ?C) -- 65 14 97 %  ?01/22/22 1921 109/74 99 ?F (37.2 ?C) Oral 80 20 98 %  ?01/22/22 1635 120/70 -- -- 77 18 98 %  ? ? ?General:  well-nourished in no acute distress.   ?Eyes: Scleral icterus present. ?ENT:  There were no oropharyngeal lesions.   ?Lymphatics:  Negative cervical, supraclavicular or axillary adenopathy.   ?Respiratory: lungs were clear bilaterally without wheezing or crackles.   ?Cardiovascular:  Regular rate and rhythm, S1/S2, without murmur, rub or gallop.  There was no pedal edema.   ?GI: Positive bowel sounds, soft, tenderness over the epigastric area.  ?Skin exam was without echymosis, petichae.   ?Neuro exam was nonfocal. Patient was alert and oriented.  Attention was good.   Language was appropriate.  Mood was normal without depression.  Speech was not pressured.  Thought content was not tangential.   ? ? ?Lab Results  ?Component Value Date  ? WBC 11.5 (H) 01/23/2022  ? HGB 7.8 (L) 01/23/2022  ? HCT 23.1 (L) 01/23/2022  ? PLT 191 01/23/2022  ? GLUCOSE 159 (H) 01/23/2022  ? CHOL 138 11/02/2011  ? TRIG 93 11/02/2011  ? HDL 37 (L) 11/02/2011  ? [place] 82 11/02/2011  ? ALT 459 (H) 01/23/2022  ? AST 206 (H) 01/23/2022  ? NA 134 (L) 01/23/2022  ? K 3.2 (L) 01/23/2022  ? CL 102 01/23/2022  ? CREATININE 0.74 01/23/2022  ? BUN 12 01/23/2022  ? CO2 24 01/23/2022  ? ? ?CT ABDOMEN PELVIS W CONTRAST ? ?Result Date: 01/16/2022 ?CLINICAL DATA:  Jaundice EXAM: CT ABDOMEN AND PELVIS WITH CONTRAST TECHNIQUE: Multidetector CT imaging of the abdomen and pelvis was performed using the standard protocol following bolus administration of intravenous contrast. RADIATION DOSE REDUCTION: This exam was performed according to the departmental dose-optimization program which includes automated exposure control, adjustment of the mA and/or kV according to patient size and/or use of iterative reconstruction technique. CONTRAST:  123mL OMNIPAQUE  IOHEXOL 300 MG/ML  SOLN COMPARISON:  CT 10/31/2018 FINDINGS: Lower chest: Lung bases demonstrate no acute consolidation or effusion. Normal cardiac size. Hepatobiliary: Marked intra hepatic biliary dilatation

## 2022-01-23 NOTE — Plan of Care (Signed)

## 2022-01-24 ENCOUNTER — Inpatient Hospital Stay (HOSPITAL_COMMUNITY): Payer: Medicare HMO

## 2022-01-24 ENCOUNTER — Telehealth: Payer: Self-pay | Admitting: Hematology

## 2022-01-24 DIAGNOSIS — K8689 Other specified diseases of pancreas: Secondary | ICD-10-CM | POA: Diagnosis not present

## 2022-01-24 LAB — COMPREHENSIVE METABOLIC PANEL
ALT: 295 U/L — ABNORMAL HIGH (ref 0–44)
AST: 76 U/L — ABNORMAL HIGH (ref 15–41)
Albumin: 1.7 g/dL — ABNORMAL LOW (ref 3.5–5.0)
Alkaline Phosphatase: 1041 U/L — ABNORMAL HIGH (ref 38–126)
Anion gap: 9 (ref 5–15)
BUN: 13 mg/dL (ref 8–23)
CO2: 23 mmol/L (ref 22–32)
Calcium: 8.2 mg/dL — ABNORMAL LOW (ref 8.9–10.3)
Chloride: 103 mmol/L (ref 98–111)
Creatinine, Ser: 0.67 mg/dL (ref 0.44–1.00)
GFR, Estimated: >60 mL/min (ref >60)
Glucose, Bld: 174 mg/dL — ABNORMAL HIGH (ref 70–99)
Potassium: 3.2 mmol/L — ABNORMAL LOW (ref 3.5–5.1)
Sodium: 135 mmol/L (ref 135–145)
Total Bilirubin: 11.4 mg/dL — ABNORMAL HIGH (ref 0.3–1.2)
Total Protein: 5 g/dL — ABNORMAL LOW (ref 6.5–8.1)

## 2022-01-24 LAB — CBC WITH DIFFERENTIAL/PLATELET
Abs Immature Granulocytes: 0.9 10*3/uL — ABNORMAL HIGH (ref 0.00–0.07)
Basophils Absolute: 0.1 10*3/uL (ref 0.0–0.1)
Basophils Relative: 1 %
Eosinophils Absolute: 0.1 10*3/uL (ref 0.0–0.5)
Eosinophils Relative: 1 %
HCT: 21 % — ABNORMAL LOW (ref 36.0–46.0)
Hemoglobin: 7.2 g/dL — ABNORMAL LOW (ref 12.0–15.0)
Immature Granulocytes: 7 %
Lymphocytes Relative: 20 %
Lymphs Abs: 2.5 10*3/uL (ref 0.7–4.0)
MCH: 31 pg (ref 26.0–34.0)
MCHC: 34.3 g/dL (ref 30.0–36.0)
MCV: 90.5 fL (ref 80.0–100.0)
Monocytes Absolute: 0.9 10*3/uL (ref 0.1–1.0)
Monocytes Relative: 7 %
Neutro Abs: 7.9 10*3/uL — ABNORMAL HIGH (ref 1.7–7.7)
Neutrophils Relative %: 64 %
Platelets: 190 10*3/uL (ref 150–400)
RBC: 2.32 MIL/uL — ABNORMAL LOW (ref 3.87–5.11)
RDW: 28.4 % — ABNORMAL HIGH (ref 11.5–15.5)
Smear Review: ADEQUATE
WBC: 12.4 10*3/uL — ABNORMAL HIGH (ref 4.0–10.5)
nRBC: 2.5 % — ABNORMAL HIGH (ref 0.0–0.2)

## 2022-01-24 LAB — GLUCOSE, CAPILLARY
Glucose-Capillary: 187 mg/dL — ABNORMAL HIGH (ref 70–99)
Glucose-Capillary: 197 mg/dL — ABNORMAL HIGH (ref 70–99)
Glucose-Capillary: 163 mg/dL — ABNORMAL HIGH (ref 70–99)
Glucose-Capillary: 220 mg/dL — ABNORMAL HIGH (ref 70–99)

## 2022-01-24 LAB — IRON AND TIBC
Iron: 51 ug/dL (ref 28–170)
Saturation Ratios: 26 % (ref 10.4–31.8)
TIBC: 196 ug/dL — ABNORMAL LOW (ref 250–450)
UIBC: 145 ug/dL

## 2022-01-24 MED ORDER — ENOXAPARIN SODIUM 40 MG/0.4ML IJ SOSY
40.0000 mg | PREFILLED_SYRINGE | INTRAMUSCULAR | Status: DC
  Administered 2022-01-24: 40 mg via SUBCUTANEOUS
  Filled 2022-01-24: qty 0.4

## 2022-01-24 MED ORDER — POLYETHYLENE GLYCOL 3350 17 G PO PACK
17.0000 g | PACK | Freq: Every day | ORAL | Status: DC
  Administered 2022-01-24: 17 g via ORAL
  Filled 2022-01-24: qty 1

## 2022-01-24 MED ORDER — POTASSIUM CHLORIDE CRYS ER 20 MEQ PO TBCR
40.0000 meq | EXTENDED_RELEASE_TABLET | Freq: Once | ORAL | Status: AC
Start: 2022-01-24 — End: 2022-01-24
  Administered 2022-01-24: 40 meq via ORAL
  Filled 2022-01-24: qty 2

## 2022-01-24 NOTE — Plan of Care (Signed)
?  Problem: Education: ?Goal: Knowledge of General Education information will improve ?Description: Including pain rating scale, medication(s)/side effects and non-pharmacologic comfort measures ?Outcome: Completed/Met ?  ?Problem: Health Behavior/Discharge Planning: ?Goal: Ability to manage health-related needs will improve ?Outcome: Completed/Met ?  ?Problem: Clinical Measurements: ?Goal: Ability to maintain clinical measurements within normal limits will improve ?Outcome: Completed/Met ?Goal: Diagnostic test results will improve ?Outcome: Progressing ?Goal: Respiratory complications will improve ?Outcome: Completed/Met ?  ?Problem: Clinical Measurements: ?Goal: Diagnostic test results will improve ?Outcome: Progressing ?  ?Problem: Clinical Measurements: ?Goal: Respiratory complications will improve ?Outcome: Completed/Met ?  ?

## 2022-01-24 NOTE — Progress Notes (Signed)
?PROGRESS NOTE ? ?Miranda Francis  SWN:[id number] DOB: Mar 23, 1946 DOA: 01/16/2022 ?PCP: [name], MD  ? ?Brief Narrative: ?Miranda Francis is a 76 year old female with PMH significant for essential hypertension, type 2 diabetes mellitus who presented to [facility] ED on 3/7 for progressive skin changes, poor appetite and weight loss.  Patient was recently referred to  gastroenterologist with recent labs concerning for elevated transaminases and bilirubin and was referred to the ED for further evaluation.  Patient reports over the last month she has been eating very minimally, lost roughly 30 pounds. ?In the ED,  Labs includes Alkaline phosphatase 959, lipase 45, AST 269, ALT 343, total bilirubin 39.3.  Ammonia level 46.  INR 1.3.  Tylenol level less than 10.  COVID-19 PCR negative.  Urinalysis unrevealing.  CT abdomen/pelvis with marked intra and extrahepatic biliary dilation with marked dilation of the pancreatic duct and distended gallbladder,  ill-defined soft tissue mass at the pancreatic head/neck region concerning for neoplasm/pancreatic carcinoma. GI was consulted , underwent ERCP found to have a mass in the pancreatic head, fine-needle aspiration completed. ?Post ERCP lipase was 1600, consistent with post ERCP pancreatitis.  GI were following.  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  Oncology consulted . ?  ? ?Assessment & Plan: ? ?Principal Problem: ?  Pancreatic mass ?Active Problems: ?  Post-ERCP acute pancreatitis ?  DM (diabetes mellitus) ([place]) ?  Elevated liver enzymes ?  HTN (hypertension) ?  Hypokalemia ?  Weakness generalized ?  Malnutrition of moderate degree ?  Normocytic anemia ? ? ?Assessment and Plan: ?* Pancreatic mass ?Patient presented with progressive weight loss 30 pounds+, poor appetite and skin discoloration over last 1 month.  She was referred to outpatient gastroenterology and has labs performed and due to abnormality she was sent in the ED for further  evaluation. ?Significant LFT, bilirubin and alk phos elevation on admission.   ?CT abdomen/pelvis with marked intra and extrahepatic biliary dilation with marked dilation of the pancreatic duct and distended gallbladder,  ill-defined soft tissue mass at the pancreatic head/neck region  concerning for neoplasm/pancreatic carcinoma. ?Gastroenterology consulted.  Patient underwent ERCP. She was found to have a mass in the pancreatic head.  As per GI,her FNA was highly suggestive of  pancreatic adenocarcinoma.  Oncology consulted.  CT chest done for staging purposes did not show any metastatic disease. ?Outpatient follow-up has been scheduled with oncology ? ?Post-ERCP acute pancreatitis ?S/p ERCP,she developed RUQ pain,Lipase 1600 consistent with post ERCP pancreatitis ?Abd pain has improved.Diet advanced to soft.  Her current abdominal pain is most likely contributed by pancreatic cancer.  Abdomen pain is better today. ? ?Elevated liver enzymes ?Likely secondary to pancreatic mass, post ERCP pancreatitis.  Elevated ALP, AST, ALT.  Continue monitoring.  Lipase level has significantly improved ,Liver enzymes also improving along with bilirubin ? ?DM (diabetes mellitus) ([place]) ?HbA1c 4.6.  Well-controlled ?Diet controlled.  Currently on sliding scale insulin ? ?HTN (hypertension) ?Currently normotensive on amlodipine. ? ?Normocytic anemia ?Likely secondary to malignancy.  Continue monitoring.  Hemoglobin stable in the range of 7-8.  Iron studies did not show any iron deficiency ? ?Weakness generalized ?No follow-up recommended by PT ? ?Hypokalemia ?Being monitored and supplemented as needed ? ? ? ? ?  ? ? ?Nutrition Problem: Moderate Malnutrition ?Etiology: chronic illness (suspected pancreatic cancer) ?  ? ?DVT prophylaxis: Lovenox ? ? ?  Code Status: Full Code ? ?Family Communication: None at bedside.  Patient is asking not to call any of her family  members until she had discussion with oncology.  But he will call  tomorrow to family. ? ?Patient status:Inpatient ? ?Patient is from :Home ? ?Anticipated discharge [id number] ? ?Estimated DC date: Tomorrow, patient says she does not have a ride today. ? ?Consultants: Gastroenterology ? ?Procedures: ERCP ? ?Antimicrobials:  ?Anti-infectives (From admission, onward)  ? ? None  ? ?  ? ? ?Subjective: ? ?Patient seen and examined at the bedside this morning.  Feels better today.  Abdominal pain is better and she is tolerating diet.  No nausea, vomiting.  We discussed about discharge planning, thoroughly explained that this she will follow-up with oncology as an outpatient.  She states she does not have a ride today and her husband is not able to pick her up so requested to stay till tomorrow morning ? ?Objective: ?Vitals:  ? 01/23/22 2014 01/24/22 0000 01/24/22 0300 01/24/22 0749  ?BP: 125/72 118/82 116/82 126/78  ?Pulse:    69  ?Resp: $Remov'18 17 15 13  'VkizHc$ ?Temp: 98.5 ?F (36.9 ?C) 98 ?F (36.7 ?C)  98.4 ?F (36.9 ?C)  ?TempSrc: Oral Oral    ?SpO2: 99% 96% 99% 99%  ?Weight:      ?Height:      ? ? ?Intake/Output Summary (Last 24 hours) at 01/24/2022 1318 ?Last data filed at 01/24/2022 0100 ?Gross per 24 hour  ?Intake 1046.75 ml  ?Output --  ?Net 1046.75 ml  ? ?Filed Weights  ? 01/17/22 0404  ?Weight: 72.5 kg  ? ? ?Examination: ? ?General exam: Overall comfortable, not in distress ?HEENT: PERRL,icterus ?Respiratory system:  no wheezes or crackles  ?Cardiovascular system: S1 & S2 heard, RRR.  ?Gastrointestinal system: Abdomen is nondistended, soft and nontender. ?Central nervous system: Alert and oriented ?Extremities: No edema, no clubbing ,no cyanosis ?Skin: No rashes, no ulcers,Icterus present   ? ?Data Reviewed: I have personally reviewed following labs and imaging studies ? ?CBC: ?Recent Labs  ?Lab 01/20/22 ?3832 01/21/22 ?0031 01/22/22 ?0411 01/23/22 ?9191 01/24/22 ?0244  ?WBC 12.4* 13.3* 11.3* 11.5* 12.4*  ?NEUTROABS  --  10.6* 8.6* 8.0* 7.9*  ?HGB 8.9* 8.7* 8.2* 7.8* 7.2*  ?HCT 25.2* 25.1* 23.2*  23.1* 21.0*  ?MCV 84.0 85.4 85.6 88.2 90.5  ?PLT 174 182 177 191 190  ? ?Basic Metabolic Panel: ?Recent Labs  ?Lab 01/19/22 ?0327 01/20/22 ?6606 01/21/22 ?0031 01/22/22 ?0411 01/23/22 ?0045 01/24/22 ?0244  ?NA 134* 132* 133* 134* 134* 135  ?K 3.3* 3.5 3.2* 3.1* 3.2* 3.2*  ?CL 101 103 103 101 102 103  ?CO2 23 21* 20* $Remov'25 24 23  'SvQBhh$ ?GLUCOSE 196* 156* 155* 136* 159* 174*  ?BUN $Rem'19 16 16 18 12 13  'TURE$ ?CREATININE 1.15* 0.85 0.91 0.91 0.74 0.67  ?CALCIUM 9.1 8.8* 8.6* 8.5* 8.4* 8.2*  ?MG 1.9 1.8  --  1.9  --   --   ?PHOS 5.5* 4.5  --   --   --   --   ? ? ? ?Recent Results (from the past 240 hour(s))  ?Resp Panel by RT-PCR (Flu A&B, Covid) Nasopharyngeal Swab     Status: None  ? Collection Time: 01/16/22 10:50 PM  ? Specimen: Nasopharyngeal Swab; Nasopharyngeal(NP) swabs in vial transport medium  ?Result Value Ref Range Status  ? SARS Coronavirus 2 by RT PCR NEGATIVE NEGATIVE Final  ?  Comment: (NOTE) ?SARS-CoV-2 target nucleic acids are NOT DETECTED. ? ?The SARS-CoV-2 RNA is generally detectable in upper respiratory ?specimens during the acute phase of infection. The lowest ?concentration of SARS-CoV-2 viral copies this assay  can detect is ?138 copies/mL. A negative result does not preclude SARS-Cov-2 ?infection and should not be used as the sole basis for treatment or ?other patient management decisions. A negative result may occur with  ?improper specimen collection/handling, submission of specimen other ?than nasopharyngeal swab, presence of viral mutation(s) within the ?areas targeted by this assay, and inadequate number of viral ?copies(<138 copies/mL). A negative result must be combined with ?clinical observations, patient history, and epidemiological ?information. The expected result is Negative. ? ?Fact Sheet for Patients:  ?EntrepreneurPulse.com.au ? ?Fact Sheet for Healthcare Providers:  ?IncredibleEmployment.be ? ?This test is no t yet approved or cleared by the [place] FDA and   ?has been authorized for detection and/or diagnosis of SARS-CoV-2 by ?FDA under an Emergency Use Authorization (EUA). This EUA will remain  ?in effect (meaning this test can be used) for the duration of the ?COVID-19 de

## 2022-01-24 NOTE — Progress Notes (Signed)
ANTICOAGULATION CONSULT NOTE - Initial Consult ? ?Pharmacy Consult for Lovenox ?Indication: VTE prophylaxis ? ?No Known Allergies ? ?Patient Measurements: ?Height: '5\' 3"'$  (160 cm) ?Weight: 72.5 kg (159 lb 13.3 oz) ?IBW/kg (Calculated) : 52.4 ?Lovenox Dosing Weight: 72.5 kg ? ?Vital Signs: ?Temp: 98.4 ?F (36.9 ?C) (03/15 0749) ?BP: 126/78 (03/15 0749) ?Pulse Rate: 69 (03/15 0749) ? ?Labs: ?Recent Labs  ?  01/22/22 ?0411 01/23/22 ?0220 01/24/22 ?0244  ?HGB 8.2* 7.8* 7.2*  ?HCT 23.2* 23.1* 21.0*  ?PLT 177 191 190  ?CREATININE 0.91 0.74 0.67  ? ? ?Estimated Creatinine Clearance: 58.8 mL/min (by C-G formula based on SCr of 0.67 mg/dL). ? ? ?Medical History: ?Past Medical History:  ?Diagnosis Date  ? Blood transfusion   ? Diabetes mellitus   ? Heart murmur   ? Hypertension   ? ?Assessment: ? 76 yr old female to begin Lovenox for VTE prophlyaxis.  SCDs currently in place.  Pancreatic mass, suspicious for pancreatic cancer. S/p biliary stent 01/18/22, biopsy pending.  ? ? Hgb has trended down since admitted. No bleeding noted. ? ?Goal of Therapy:  ?VTE prophylactic dose of Lovenox ?Monitor platelets by anticoagulation protocol: Yes ?  ?Plan:  ?Lovenox 40 mg SQ q24h. ?Follow up CBC in am and at least q72h. ?Monitor for signs/symptoms of bleeding. ? ?Arty Baumgartner, RPh ?01/24/2022,1:47 PM ? ? ?

## 2022-01-24 NOTE — Telephone Encounter (Signed)
Scheduled appt per 3/14 referral. Pt is aware of appt date and time. Pt is aware to arrive 15 mins prior to appt time and to bring and updated insurance card. Pt is aware of appt location.   ?

## 2022-01-25 DIAGNOSIS — K8689 Other specified diseases of pancreas: Secondary | ICD-10-CM | POA: Diagnosis not present

## 2022-01-25 LAB — COMPREHENSIVE METABOLIC PANEL
ALT: 203 U/L — ABNORMAL HIGH (ref 0–44)
AST: 52 U/L — ABNORMAL HIGH (ref 15–41)
Albumin: 1.7 g/dL — ABNORMAL LOW (ref 3.5–5.0)
Alkaline Phosphatase: 852 U/L — ABNORMAL HIGH (ref 38–126)
Anion gap: 8 (ref 5–15)
BUN: 10 mg/dL (ref 8–23)
CO2: 23 mmol/L (ref 22–32)
Calcium: 8.2 mg/dL — ABNORMAL LOW (ref 8.9–10.3)
Chloride: 101 mmol/L (ref 98–111)
Creatinine, Ser: 0.66 mg/dL (ref 0.44–1.00)
GFR, Estimated: >60 mL/min (ref >60)
Glucose, Bld: 231 mg/dL — ABNORMAL HIGH (ref 70–99)
Potassium: 3.5 mmol/L (ref 3.5–5.1)
Sodium: 132 mmol/L — ABNORMAL LOW (ref 135–145)
Total Bilirubin: 9.1 mg/dL — ABNORMAL HIGH (ref 0.3–1.2)
Total Protein: 4.8 g/dL — ABNORMAL LOW (ref 6.5–8.1)

## 2022-01-25 LAB — CBC WITH DIFFERENTIAL/PLATELET
Abs Immature Granulocytes: 1.14 10*3/uL — ABNORMAL HIGH (ref 0.00–0.07)
Basophils Absolute: 0.1 10*3/uL (ref 0.0–0.1)
Basophils Relative: 1 %
Eosinophils Absolute: 0.2 10*3/uL (ref 0.0–0.5)
Eosinophils Relative: 1 %
HCT: 19.9 % — ABNORMAL LOW (ref 36.0–46.0)
Hemoglobin: 6.4 g/dL — CL (ref 12.0–15.0)
Immature Granulocytes: 9 %
Lymphocytes Relative: 20 %
Lymphs Abs: 2.5 10*3/uL (ref 0.7–4.0)
MCH: 30.5 pg (ref 26.0–34.0)
MCHC: 32.2 g/dL (ref 30.0–36.0)
MCV: 94.8 fL (ref 80.0–100.0)
Monocytes Absolute: 0.9 10*3/uL (ref 0.1–1.0)
Monocytes Relative: 7 %
Neutro Abs: 7.6 10*3/uL (ref 1.7–7.7)
Neutrophils Relative %: 62 %
Platelets: 184 10*3/uL (ref 150–400)
RBC: 2.1 MIL/uL — ABNORMAL LOW (ref 3.87–5.11)
RDW: 24.9 % — ABNORMAL HIGH (ref 11.5–15.5)
Smear Review: ADEQUATE
WBC: 12.4 10*3/uL — ABNORMAL HIGH (ref 4.0–10.5)
nRBC: 1.5 % — ABNORMAL HIGH (ref 0.0–0.2)

## 2022-01-25 LAB — GLUCOSE, CAPILLARY
Glucose-Capillary: 188 mg/dL — ABNORMAL HIGH (ref 70–99)
Glucose-Capillary: 171 mg/dL — ABNORMAL HIGH (ref 70–99)
Glucose-Capillary: 214 mg/dL — ABNORMAL HIGH (ref 70–99)
Glucose-Capillary: 175 mg/dL — ABNORMAL HIGH (ref 70–99)
Glucose-Capillary: 242 mg/dL — ABNORMAL HIGH (ref 70–99)

## 2022-01-25 LAB — PREPARE RBC (CROSSMATCH)

## 2022-01-25 LAB — HEMOGLOBIN AND HEMATOCRIT, BLOOD
HCT: 24.5 % — ABNORMAL LOW (ref 36.0–46.0)
Hemoglobin: 8.4 g/dL — ABNORMAL LOW (ref 12.0–15.0)

## 2022-01-25 LAB — ABO/RH: ABO/RH(D): B POS

## 2022-01-25 MED ORDER — POTASSIUM CHLORIDE CRYS ER 20 MEQ PO TBCR
40.0000 meq | EXTENDED_RELEASE_TABLET | Freq: Once | ORAL | Status: AC
  Administered 2022-01-25: 40 meq via ORAL
  Filled 2022-01-25: qty 2

## 2022-01-25 MED ORDER — CHOLESTYRAMINE LIGHT 4 G PO PACK: 4.0000 g | PACK | Freq: Two times a day (BID) | ORAL | 0 refills | Status: DC

## 2022-01-25 MED ORDER — SODIUM CHLORIDE 0.9% IV SOLUTION: Freq: Once | INTRAVENOUS | Status: AC

## 2022-01-25 MED ORDER — OXYCODONE HCL 5 MG PO TABS: 5.0000 mg | ORAL_TABLET | ORAL | 0 refills | Status: DC | PRN

## 2022-01-25 MED ORDER — POLYETHYLENE GLYCOL 3350 17 G PO PACK: 17.0000 g | PACK | Freq: Every day | ORAL | 0 refills | Status: DC

## 2022-01-25 NOTE — Care Management Important Message (Signed)
Important Message ? ?Patient Details  ?Name: Miranda Francis ?MRN: [id number] ?Date of Birth: 02-19-1946 ? ? ?Medicare Important Message Given:  Yes ? ? ? ? ?Levada Dy  Gervase Colberg-Martin ?01/25/2022, 1:34 PM ?

## 2022-01-25 NOTE — Discharge Summary (Signed)
Physician Discharge Summary  ?Miranda Francis WUJ:[id number] DOB: 09/03/1946 DOA: 01/16/2022 ? ?PCP: [name], MD ? ?Admit date: 01/16/2022 ?Discharge date: 01/25/2022 ? ?Admitted From: Home ?Disposition:  Home ? ?Discharge Condition:Stable ?CODE STATUS:FULL ?Diet recommendation: Regular  ? ? ?Brief/Interim Summary: ?Miranda Francis is a 76 year old female with PMH significant for essential hypertension, type 2 diabetes mellitus who presented to [facility] ED on 3/7 for progressive skin changes, poor appetite and weight loss. ?Lab work showed Alkaline phosphatase 959, lipase 45, AST 269, ALT 343, total bilirubin 39.3.  Ammonia level 46.  INR 1.3.    CT abdomen/pelvis with marked intra and extrahepatic biliary dilation with marked dilation of the pancreatic duct and distended gallbladder,  ill-defined soft tissue mass at the pancreatic head/neck region concerning for neoplasm/pancreatic carcinoma. GI was consulted , underwent ERCP found to have a mass in the pancreatic head, fine-needle aspiration completed.Post ERCP lipase was 1600, consistent with post ERCP pancreatitis.  GI were following.  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  Oncology consulted, recommended outpatient follow-up.  Hospital course also remarkable for anemia requiring a unit of blood transfusion.  She is medically stable for discharge today.  She has an appointment with oncology next week. ? ?Following problems were addressed during her hospitalization: ? ? ?Pancreatic mass ?Patient presented with progressive weight loss 30 pounds+, poor appetite and skin discoloration over last 1 month.  She was referred to outpatient gastroenterology and has labs performed and due to abnormality she was sent in the ED for further evaluation. ?Significant LFT, bilirubin and alk phos elevation on admission.   ?CT abdomen/pelvis with marked intra and extrahepatic biliary dilation with marked dilation of the pancreatic duct and distended gallbladder,   ill-defined soft tissue mass at the pancreatic head/neck region  concerning for neoplasm/pancreatic carcinoma. ?Gastroenterology consulted.  Patient underwent ERCP. She was found to have a mass in the pancreatic head.  As per GI,her FNA was highly suggestive of  pancreatic adenocarcinoma.  Oncology consulted.  CT chest done for staging purposes did not show any metastatic disease. ?Outpatient follow-up has been scheduled with oncology ?  ?Post-ERCP acute pancreatitis ?S/p ERCP,she developed RUQ pain,Lipase 1600 consistent with post ERCP pancreatitis ?Abd pain has improved.Diet advanced to solid.  Her current abdominal pain is most likely contributed by pancreatic cancer.  Abdomen pain is better today. ?  ?Elevated liver enzymes ?Likely secondary to pancreatic mass, post ERCP pancreatitis.  Elevated ALP, AST, ALT.  Continue monitoring.  Lipase level has significantly improved ,Liver enzymes also improving along with bilirubin ?  ?DM (diabetes mellitus) ([place]) ?HbA1c 4.6.  Well-controlled ?Takes glipizide at home ?  ?HTN (hypertension) ?Currently normotensive on amlodipine. ?  ?Normocytic anemia ?Likely secondary to malignancy.    Iron studies did not show any iron deficiency.  Hemoglobin dropped to 6, given a unit of blood transfusion here.  Follow-up CBC in a week at cancer center ?  ?Weakness generalized ?No follow-up recommended by PT ?  ?Hypokalemia ?Continue supplementation at home ?  ?  ? ?Discharge Diagnoses:  ?Principal Problem: ?  Pancreatic mass ?Active Problems: ?  Post-ERCP acute pancreatitis ?  DM (diabetes mellitus) ([place]) ?  Elevated liver enzymes ?  HTN (hypertension) ?  Hypokalemia ?  Weakness generalized ?  Malnutrition of moderate degree ?  Normocytic anemia ? ? ? ?Discharge Instructions ? ?Discharge Instructions   ? ? Diet general   Complete by: As directed ?  ? Discharge instructions   Complete by: As directed ?  ?  1)Please take prescribed medications as instructed ?2)Follow up with oncology as an  outpatient as soon as possible. ?3)Do a CBC test to check your Hemoglobin in  a week  ? Increase activity slowly   Complete by: As directed ?  ? ?  ? ?Allergies as of 01/25/2022   ?No Known Allergies ?  ? ?  ?Medication List  ?  ? ?STOP taking these medications   ? ?aspirin 81 MG chewable tablet ?  ?losartan 100 MG tablet ?Commonly known as: Cozaar ?  ? ?  ? ?TAKE these medications   ? ?acetaminophen 500 MG tablet ?Commonly known as: TYLENOL ?Take 1,000 mg by mouth every 6 (six) hours as needed for moderate pain or headache. ?  ?amLODipine 5 MG tablet ?Commonly known as: NORVASC ?Take 5 mg by mouth daily. ?  ?cholestyramine light 4 g packet ?Commonly known as: PREVALITE ?Take 1 packet (4 g total) by mouth 2 (two) times daily. ?  ?glipiZIDE 5 MG tablet ?Commonly known as: GLUCOTROL ?Take 2.5 mg by mouth 2 (two) times daily with a meal. ?  ?ibuprofen 200 MG tablet ?Commonly known as: ADVIL ?Take 400 mg by mouth every 6 (six) hours as needed for moderate pain or headache. ?  ?Klor-Con M20 20 MEQ tablet ?Generic drug: potassium chloride SA ?Take 20 mEq by mouth 2 (two) times daily. ?  ?oxyCODONE 5 MG immediate release tablet ?Commonly known as: Oxy IR/ROXICODONE ?Take 1 tablet (5 mg total) by mouth every 3 (three) hours as needed for severe pain. ?  ?polyethylene glycol 17 g packet ?Commonly known as: MIRALAX / GLYCOLAX ?Take 17 g by mouth daily. ?Start taking on: January 26, 2022 ?  ?QC Vitamin D3 50 MCG (2000 UT) Tabs ?Generic drug: Cholecalciferol ?Take 2,000 Units by mouth daily. ?  ? ?  ? ? Follow-up Information   ? ? [name], MD .   ?Specialty: Internal Medicine ?Contact information: ?[address] ?[place] [address] ?[phone] ? ? ?  ?  ? ?  ?  ? ?  ? ?No Known Allergies ? ?Consultations: ?Oncology,GI ? ? ?Procedures/Studies: ?CT CHEST WO CONTRAST ? ?Result Date: 01/24/2022 ?CLINICAL DATA:  Pancreatic cancer staging EXAM: CT CHEST WITHOUT CONTRAST TECHNIQUE: Multidetector CT imaging of the chest was  performed following the standard protocol without IV contrast. RADIATION DOSE REDUCTION: This exam was performed according to the departmental dose-optimization program which includes automated exposure control, adjustment of the mA and/or kV according to patient size and/or use of iterative reconstruction technique. COMPARISON:  CT abdomen and pelvis 01/16/2022. Chest radiograph 10/29/2011 FINDINGS: Cardiovascular: Normal heart size. No pericardial effusions. Normal caliber thoracic aorta. Scattered aortic and coronary artery calcifications. Mediastinum/Nodes: Esophagus is decompressed. No significant lymphadenopathy demonstrated in the chest. Scattered lymph nodes are not pathologically enlarged. Thyroid gland is unremarkable. Lungs/Pleura: Atelectasis or consolidation in the right lung base. No pulmonary nodules are identified. Airways appear patent. No pleural effusions. Upper Abdomen: Mass in the pancreatic head as seen on prior CT abdomen and pelvis, incompletely included within the field of view. A bile duct stent is present. Gas in the gallbladder and bile ducts is consistent with instrumentation. Musculoskeletal: Degenerative changes in the spine. Healing fractures of the right anterolateral fifth and sixth ribs. No destructive bone lesions. IMPRESSION: 1. No evidence of metastatic disease in the chest. 2. Atelectasis or consolidation in the right lower lung. 3. Healing right rib fractures. 4. Mass in the head of the pancreas. See previous report of CT  abdomen and pelvis 01/16/2022. Interval placement of the biliary stent. Electronically Signed   By: Lucienne Capers M.D.   On: 01/24/2022 02:58  ? ?CT ABDOMEN PELVIS W CONTRAST ? ?Result Date: 01/16/2022 ?CLINICAL DATA:  Jaundice EXAM: CT ABDOMEN AND PELVIS WITH CONTRAST TECHNIQUE: Multidetector CT imaging of the abdomen and pelvis was performed using the standard protocol following bolus administration of intravenous contrast. RADIATION DOSE REDUCTION: This  exam was performed according to the departmental dose-optimization program which includes automated exposure control, adjustment of the mA and/or kV according to patient size and/or use of iterative re

## 2022-01-25 NOTE — Progress Notes (Signed)
[facility] notified of hgb 6.4 awaiting order. Arthor Captain LPN ?

## 2022-01-25 NOTE — Progress Notes (Signed)
Delay in response was due to NP not having a pager. Quick response using the Chat. Arthor Captain LPN ?

## 2022-01-25 NOTE — Progress Notes (Signed)
ANTICOAGULATION CONSULT NOTE - Initial Consult ? ?Pharmacy Consult for Lovenox ?Indication: VTE prophylaxis ? ?No Known Allergies ? ?Patient Measurements: ?Height: '5\' 3"'$  (160 cm) ?Weight: 72.5 kg (159 lb 13.3 oz) ?IBW/kg (Calculated) : 52.4 ?Lovenox Dosing Weight: 72.5 kg ? ?Vital Signs: ?Temp: 98.3 ?F (36.8 ?C) (03/16 0454) ?BP: 119/67 (03/16 0454) ?Pulse Rate: 69 (03/16 0454) ? ?Labs: ?Recent Labs  ?  01/23/22 ?0220 01/24/22 ?0244 01/25/22 ?0226  ?HGB 7.8* 7.2* 6.4*  ?HCT 23.1* 21.0* 19.9*  ?PLT 191 190 184  ?CREATININE 0.74 0.67 0.66  ? ? ? ?Estimated Creatinine Clearance: 58.8 mL/min (by C-G formula based on SCr of 0.66 mg/dL). ? ? ?Medical History: ?Past Medical History:  ?Diagnosis Date  ? Blood transfusion   ? Diabetes mellitus   ? Heart murmur   ? Hypertension   ? ?Assessment: ? 76 yr old female to begin Lovenox for VTE prophlyaxis.  SCDs currently in place.  Pancreatic mass, suspicious for pancreatic cancer. S/p biliary stent 01/18/22, biopsy pending.  ? ?Hgb 7.2>>6.4 this am. D/W primary MD, will hold lovenox for now while source of drop is investigated.  ? ?Goal of Therapy:  ?VTE prophylactic dose of Lovenox ?Monitor platelets by anticoagulation protocol: Yes ?  ?Plan:  ?Hold Lovenox 40 mg SQ q24h. ?Follow up CBC in am and at least q72h. ?Monitor for signs/symptoms of bleeding. ? ?Torria Fromer A. Levada Dy, PharmD, BCPS, FNKF ?Clinical Pharmacist ?[place] ?Please utilize Amion for appropriate phone number to reach the unit pharmacist ([place]) ? ?01/25/2022,7:32 AM ? ? ?

## 2022-01-25 NOTE — Plan of Care (Signed)
Reviewed discharge instructions with patient. H&H rechecked after blood transfusion. Provider made aware. Awaiting for ride ? ?Problem: Clinical Measurements: ?Goal: Will remain free from infection ?Outcome: Adequate for Discharge ?Goal: Diagnostic test results will improve ?Outcome: Adequate for Discharge ?Goal: Cardiovascular complication will be avoided ?Outcome: Adequate for Discharge ?  ?Problem: Activity: ?Goal: Risk for activity intolerance will decrease ?Outcome: Adequate for Discharge ?  ?Problem: Nutrition: ?Goal: Adequate nutrition will be maintained ?Outcome: Adequate for Discharge ?  ?Problem: Coping: ?Goal: Level of anxiety will decrease ?Outcome: Adequate for Discharge ?  ?Problem: Elimination: ?Goal: Will not experience complications related to bowel motility ?Outcome: Adequate for Discharge ?Goal: Will not experience complications related to urinary retention ?Outcome: Adequate for Discharge ?  ?Problem: Pain Managment: ?Goal: General experience of comfort will improve ?Outcome: Adequate for Discharge ?  ?Problem: Safety: ?Goal: Ability to remain free from injury will improve ?Outcome: Adequate for Discharge ?  ?Problem: Skin Integrity: ?Goal: Risk for impaired skin integrity will decrease ?Outcome: Adequate for Discharge ?  ?

## 2022-01-26 ENCOUNTER — Telehealth: Payer: Self-pay | Admitting: *Deleted

## 2022-01-26 ENCOUNTER — Encounter: Payer: Self-pay | Admitting: *Deleted

## 2022-01-26 LAB — TYPE AND SCREEN
ABO/RH(D): B POS
Antibody Screen: NEGATIVE
Unit division: 0

## 2022-01-26 LAB — BPAM RBC
Blood Product Expiration Date: 202304142359
ISSUE DATE / TIME: 202303160924
Unit Type and Rh: 7300

## 2022-01-26 NOTE — Progress Notes (Signed)
Oncology Discharge Planning Note ? ?[facility] ?Address: [address], [place], [place] [address] ?Hours of Operation:  8am - 5pm, Monday - Friday  ?Clinic Contact Information:  [phone]) [phone] ? ?Oncology Care Team: ?Medical Oncologist:  Dr. Truitt Merle ? ?Patient Details: ?Name:  Miranda Francis, Miranda Francis ?MRN:   [id number] ?DOB:   12-15-1945 ?Reason for Current Admission: '@PPROB'$ @ ? ?Discharge Planning Narrative: ?Discharge follow-up appointments for oncology are current and available on the AVS and MyChart.   ?Upon discharge from the hospital, hematology/oncology's post discharge plan of care for the outpatient setting is: OV with Dr Burr Medico 01/29/22 @ 3:30. ? ?[name] will be called within two business days after discharge to review hematology/oncology's plan of care for full understanding.   ? ?Outpatient Oncology Specific Care Only: ?Oncology appointment transportation needs addressed?:  yes ?Oncology medication management for symptom management addressed?:  not applicable ?Chemo Alert Card reviewed?:  not applicable ?Immunotherapy Alert Card reviewed?:  not applicable ?

## 2022-01-26 NOTE — Telephone Encounter (Signed)
Vanessa Sweis was contacted by telephone to verify understanding of discharge instructions status post their most recent discharge from the hospital on the date:  01/20/22.  Inpatient discharge AVS was re-reviewed with patient, along with cancer center appointments.  Verification of understanding for oncology specific follow-up was validated using the Teach Back method. F/U appointment was made on 3/15. ? ?Transportation to appointments were confirmed for the patient as being self/caregiver. ? ?Ludella Herberger?s questions were addressed to their satisfaction upon completion of this post discharge follow-up call for outpatient oncology.  ?

## 2022-01-28 DIAGNOSIS — C259 Malignant neoplasm of pancreas, unspecified: Secondary | ICD-10-CM | POA: Insufficient documentation

## 2022-01-29 ENCOUNTER — Inpatient Hospital Stay: Payer: Medicare HMO | Attending: Hematology | Admitting: Hematology

## 2022-01-29 ENCOUNTER — Other Ambulatory Visit: Payer: Self-pay

## 2022-01-29 ENCOUNTER — Encounter: Payer: Self-pay | Admitting: Hematology

## 2022-01-29 VITALS — BP 164/95 | HR 98 | Temp 98.6°F | Resp 18 | Ht 63.0 in | Wt 158.6 lb

## 2022-01-29 DIAGNOSIS — D649 Anemia, unspecified: Secondary | ICD-10-CM | POA: Insufficient documentation

## 2022-01-29 DIAGNOSIS — C250 Malignant neoplasm of head of pancreas: Secondary | ICD-10-CM | POA: Diagnosis present

## 2022-01-29 DIAGNOSIS — R630 Anorexia: Secondary | ICD-10-CM | POA: Diagnosis not present

## 2022-01-29 DIAGNOSIS — K831 Obstruction of bile duct: Secondary | ICD-10-CM | POA: Insufficient documentation

## 2022-01-29 DIAGNOSIS — R634 Abnormal weight loss: Secondary | ICD-10-CM | POA: Insufficient documentation

## 2022-01-29 NOTE — Progress Notes (Signed)
?[place]   ?Telephone:([phone]) [phone] [phone]   ?Clinic Follow up Note  ? ?Patient Care Team: ?[name], MD as PCP - General (Internal Medicine) ?Truitt Merle, MD as Consulting Physician (Oncology) ?Earl Gala, Deliah Goody, RN as Sales executive (Oncology) ? ?Date of Service:  01/29/2022 ? ?CHIEF COMPLAINT: f/u of pancreatic cancer ? ?CURRENT THERAPY:  ?PENDING neoadjuvant chemo  ? ?ASSESSMENT & PLAN:  ?Kamden Reber is a 76 y.o. female with  ? ?1. Pancreatic Cancer, stage IB c(T2, N0, M0), borderline resectable, MMR pending  ?-presented to PCP with jaundice for one month. Also complained of epigastric pain, poor appetite, weight loss, and light-colored stool. Found to have elevated liver enzymes and referred to ED on 01/16/22. CMP in ED showed tbili at 39.3. CT AP showed: marked biliary dilatation; soft tissue mass at pancreatic head with marked narrowing of SMV; diverticular disease of colon.  No distant metastasis on CT scan. ?-baseline CA 19-9 on 01/16/22 was elevated at 9,561. ?-MRCP abdomen MRI on 01/17/22 showed: obstructing mass within pancreatic head with low-level enhancement, causing significant extrinsic narrowing at SMV and dilatation. ?-ERCP on 01/18/22 by Dr. Benson Norway, FNA of pancreas head mass showed  ?-we reviewed the findings and work up thus far. I discussed treatment options with them today.  Due to the significant SMV invasion from the tumor, this is borderline resectable, I will refer her to see pancreatobiliary surgeon Dr. Zenia Resides  ?-I recommend neoadjuvant chemotherapy followed by surgery. We will discuss her case in our multidisciplinary tumor conference to confirm these recommendations. We reviewed standard chemotherapy regomens for pancreatic cancer, including FOLFIRINOX, gemcitabine and Abraxane, or gemcitabine alone.  Due to her age and limited performance status, I do not think she is a candidate for FOLFIRINOX, I recommend her to try gemcitabine and Abraxane. I  also recommend port placement, which was reviewed by our nurse navigator Santiago Glad. ?--Chemotherapy consent: Side effects including but does not not limited to, fatigue, nausea, vomiting, diarrhea, hair loss, neuropathy, fluid retention, renal and kidney dysfunction, neutropenic fever, needed for blood transfusion, bleeding, were discussed with patient in great detail. She agrees to proceed.  Plan to start in about 2 weeks when her hyperbilirubinemia improves.  If her tbili remains elevated, we will start her on gemcitabine alone for first cycle. ? ?2. Symptom Management: decreased appetite with weight loss, abdominal discomfort ?-she reports a ~40 lb weight loss over the last 4 months or so. She endorses trying to supplement with Ensure and Boost. She notes trying to obtain Glucerna due to her DM. ?-referral to dietician ? ?3. Obstructive jaundice ?-presented with hyperbilirubinemia at 39.3 ?-s/p stent placement on 01/18/22 ?-tbili improving, down to 9.1 on 01/25/22 ? ?4. Anemia ?-hgb dropped to 6.4 by 01/25/22 while in the hospital. She received blood transfusion that day, and hgb recovered to 8.4 the same day. ? ?5. Genetic Testing ?-she reports colon cancer in a brother (age 76) and a sister (age 75). ?-she is interested in testing; I will refer her. ? ? ?PLAN: ?-referral to Dr. Zenia Resides ?-urgent genetics referral ?-dietician referral ?-will order baseline labs to be done with genetic testing lab this ir next week ?-port placement by Dr. Zenia Resides ?-chemo class, and first cycle chemo gem/abraxane in 2 weeks  ? ? ?No problem-specific Assessment & Plan notes found for this encounter. ? ? ?SUMMARY OF ONCOLOGIC HISTORY: ?Oncology History Overview Note  ? Cancer Staging  ?Pancreatic cancer ([place]) ?Staging form: Exocrine Pancreas, AJCC 8th Edition ?- Clinical  stage from 01/18/2022: Stage IB (cT2, cN0, cM0) - Signed by Truitt Merle, MD on 01/28/2022 ? ?  ?Pancreatic cancer [facility])  ?01/16/2022 Imaging  ? CLINICAL DATA:  Jaundice ?  ?EXAM: ?CT  ABDOMEN AND PELVIS WITH CONTRAST ? ?IMPRESSION: ?1. Marked intra and extrahepatic biliary dilatation with marked ?dilatation of pancreatic duct and distended gallbladder. There is ?significant atrophy of the body and tail of pancreas. Ill-defined ?soft tissue mass at the pancreatic head neck region with marked ?narrowing of the SMV by ill-defined mass, constellation of findings ?concerning for neoplasm/pancreas carcinoma. ?2. Diverticular disease of the colon without acute wall thickening ?3. Mild endometrial thickening up to 9 mm, recommend nonemergent pelvic ultrasound for further evaluation ?  ?01/17/2022 Imaging  ? EXAM: ?MRI ABDOMEN WITHOUT AND WITH CONTRAST (INCLUDING MRCP) ? ?IMPRESSION: ?1. Obstructing mass within the pancreatic head with an intraluminal ?component demonstrating low level enhancement. 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 ?2. This mass causes significant extrinsic narrowing at the ?portal/superior mesenteric vein confluence as well as marked intra ?and extrahepatic biliary dilatation. ?3. No distant metastases identified. ?4. Endoscopy for endoscopic ultrasound, tissue sampling and biliary decompression recommended. ?  ?01/18/2022 Cancer Staging  ? Staging form: Exocrine Pancreas, AJCC 8th Edition ?- Clinical stage from 01/18/2022: Stage IB (cT2, cN0, cM0) - Signed by Truitt Merle, MD on 01/28/2022 ?Stage prefix: Initial diagnosis ?Total positive nodes: 0 ? ?  ?01/18/2022 Initial Biopsy  ? FINAL MICROSCOPIC DIAGNOSIS:  ?A. PANCREAS, HEAD, FINE NEEDLE ASPIRATION:  ?- Malignant cells present consistent with adenocarcinoma ?  ?01/24/2022 Imaging  ? EXAM: ?CT CHEST WITHOUT CONTRAST ? ?IMPRESSION: ?1. No evidence of metastatic disease in the chest. ?2. Atelectasis or consolidation in the right lower lung. ?3. Healing right rib fractures. ?4.  Mass in the head of the pancreas. See previous report of CT ?abdomen and pelvis 01/16/2022. Interval placement of the biliary ?stent. ?  ?01/28/2022 Initial Diagnosis  ? Pancreatic cancer ([place]) ?  ? ? ? ?INTERVAL HISTORY:  ?Blondell Laperle is here for a follow up of pancreatic cancer. She was last seen by Dr. Alen Blew on 01/23/22 while she was in the hospital. She presents to the clinic accompanied by her husband and daughter. ?She reports she is feeling better since her discharge.  ?They report they reached out to GI after a month of symptoms and notes they saw her a month later. She reports her appetite went down, and she had increased gas. She denies abdominal discomfort and bloating. She reports she has developed abdominal discomfort since stent placement. She reports her energy level is down to 50%. ?  ?All other systems were reviewed with the patient and are negative. ? ?MEDICAL HISTORY:  ?Past Medical History:  ?Diagnosis Date  ? Blood transfusion   ? Diabetes mellitus   ? Heart murmur   ? Hypertension   ? ? ?SURGICAL HISTORY: ?Past Surgical History:  ?Procedure Laterality Date  ? BILIARY STENT PLACEMENT  01/18/2022  ? Procedure: BILIARY STENT PLACEMENT;  Surgeon: Carol Ada, MD;  Location: [place];  Service: Gastroenterology;;  ? DILATION AND CURETTAGE OF UTERUS    ? ERCP N/A 01/18/2022  ? Procedure: ENDOSCOPIC RETROGRADE CHOLANGIOPANCREATOGRAPHY (ERCP);  Surgeon: Carol Ada, MD;  Location: [place];  Service: Gastroenterology;  Laterality: N/A;  ? ESOPHAGOGASTRODUODENOSCOPY N/A 01/18/2022  ? Procedure: ESOPHAGOGASTRODUODENOSCOPY (EGD);  Surgeon: Carol Ada, MD;  Location: [facility]  ENDOSCOPY;  Service: Gastroenterology;  Laterality: N/A;  ? EUS  01/18/2022  ? Procedure: UPPER ENDOSCOPIC ULTRASOUND (EUS) LINEAR;  Surgeon: Carol Ada, MD;  Location: [place];  Service: Gastroenterology;;  ? FINE NEEDLE ASPIRATION  01/18/2022  ? Procedure: FINE NEEDLE ASPIRATION (FNA) LINEAR;  Surgeon: Carol Ada, MD;   Location: [place];  Service: Gastroenterology;;  ? NO PAST SURGERIES    ? SPHINCTEROTOMY  01/18/2022  ? Procedure: SPHINCTEROTOMY;  Surgeon: Carol Ada, MD;  Location: [facility] ENDOSCOPY;  Service: [place] [place]

## 2022-01-29 NOTE — Progress Notes (Signed)
I met with Miranda Francis, her husband, and her daughter before her consultation with Dr Burr Medico.  I explained my role as a nurse navigator and provided my contact information. ?I explained the services provided at [facility] and provided written information.  I explained the alight grant and let  them know one of the financial advisors will reach out to  her at the time of her chemo education class. ?I briefly explained insertion and care of a port a cath.  I showed a sample of the port a cath.  I briefly reviewed common side effects associated with the chemotherapy regimen,  gemzar/abraxe and FOLFOX.  I told them  that her will be scheduled for chemotherapy education class prior to receiving chemotherapy.  I told her our schedulers will call her with those appts.  All questions were answered.  She verbalized understanding. ? ?

## 2022-01-30 ENCOUNTER — Other Ambulatory Visit: Payer: Self-pay

## 2022-01-30 ENCOUNTER — Encounter: Payer: Self-pay | Admitting: Hematology

## 2022-01-30 ENCOUNTER — Other Ambulatory Visit (HOSPITAL_BASED_OUTPATIENT_CLINIC_OR_DEPARTMENT_OTHER): Payer: Self-pay

## 2022-01-30 ENCOUNTER — Other Ambulatory Visit (HOSPITAL_COMMUNITY): Payer: Self-pay

## 2022-01-30 DIAGNOSIS — C250 Malignant neoplasm of head of pancreas: Secondary | ICD-10-CM

## 2022-01-30 MED ORDER — PROCHLORPERAZINE MALEATE 10 MG PO TABS
10.0000 mg | ORAL_TABLET | Freq: Four times a day (QID) | ORAL | 1 refills | Status: DC | PRN
  Filled 2022-01-30: qty 30, 8d supply, fill #0
  Filled 2022-04-20: qty 30, 8d supply, fill #1

## 2022-01-30 MED ORDER — LIDOCAINE-PRILOCAINE 2.5-2.5 % EX CREA
TOPICAL_CREAM | CUTANEOUS | 3 refills | Status: DC
  Filled 2022-01-30: qty 30, 15d supply, fill #0

## 2022-01-30 MED ORDER — ONDANSETRON HCL 8 MG PO TABS
8.0000 mg | ORAL_TABLET | Freq: Two times a day (BID) | ORAL | 1 refills | Status: DC | PRN
  Filled 2022-01-30: qty 30, 15d supply, fill #0
  Filled 2022-04-20: qty 30, 15d supply, fill #1

## 2022-01-30 NOTE — Progress Notes (Signed)
Nutrition referral placed.

## 2022-01-30 NOTE — Progress Notes (Signed)
START ON PATHWAY REGIMEN - Pancreatic Adenocarcinoma ° ° °  A cycle is every 28 days: °    Nab-paclitaxel (protein bound)  °    Gemcitabine  ° °**Always confirm dose/schedule in your pharmacy ordering system** ° °Patient Characteristics: °Preoperative (Clinical Staging), Borderline Resectable, PS ? 2, BRCA1/2 and PALB2 Mutation Absent/Unknown °Therapeutic Status: Preoperative (Clinical Staging) °AJCC T Category: cT2 °AJCC N Category: cN0 °Resectability Status: Borderline Resectable °AJCC M Category: cM0 °AJCC 8 Stage Grouping: IB °ECOG Performance Status: 2 °BRCA1/2 Mutation Status: Awaiting Test Results °PALB2 Mutation Status: Awaiting Test Results °Intent of Therapy: °Curative Intent, Discussed with Patient °

## 2022-01-31 ENCOUNTER — Telehealth: Payer: Self-pay | Admitting: Hematology

## 2022-01-31 ENCOUNTER — Other Ambulatory Visit: Payer: Self-pay

## 2022-01-31 NOTE — Telephone Encounter (Signed)
.  Called patient to schedule appointment per 3/21 inbasket, patient is aware of date and time.   ?

## 2022-01-31 NOTE — Progress Notes (Signed)
The proposed treatment discussed in conference is for discussion purpose only and is not a binding recommendation.  The patients have not been physically examined, or presented with their treatment options.  Therefore, final treatment plans cannot be decided.  

## 2022-02-01 ENCOUNTER — Other Ambulatory Visit (HOSPITAL_COMMUNITY): Payer: Self-pay

## 2022-02-02 DIAGNOSIS — C250 Malignant neoplasm of head of pancreas: Secondary | ICD-10-CM

## 2022-02-02 LAB — CYTOLOGY - NON PAP

## 2022-02-07 ENCOUNTER — Inpatient Hospital Stay: Payer: Medicare HMO

## 2022-02-07 ENCOUNTER — Other Ambulatory Visit: Payer: Self-pay | Admitting: Genetic Counselor

## 2022-02-07 ENCOUNTER — Encounter: Payer: Self-pay | Admitting: Genetic Counselor

## 2022-02-07 ENCOUNTER — Inpatient Hospital Stay (HOSPITAL_BASED_OUTPATIENT_CLINIC_OR_DEPARTMENT_OTHER): Payer: Medicare HMO | Admitting: Genetic Counselor

## 2022-02-07 ENCOUNTER — Other Ambulatory Visit: Payer: Self-pay

## 2022-02-07 DIAGNOSIS — C250 Malignant neoplasm of head of pancreas: Secondary | ICD-10-CM

## 2022-02-07 DIAGNOSIS — Z800 Family history of malignant neoplasm of digestive organs: Secondary | ICD-10-CM

## 2022-02-07 DIAGNOSIS — Z8042 Family history of malignant neoplasm of prostate: Secondary | ICD-10-CM | POA: Diagnosis not present

## 2022-02-07 DIAGNOSIS — Z803 Family history of malignant neoplasm of breast: Secondary | ICD-10-CM

## 2022-02-07 LAB — CBC WITH DIFFERENTIAL (CANCER CENTER ONLY)
Abs Immature Granulocytes: 0.01 10*3/uL (ref 0.00–0.07)
Basophils Absolute: 0.1 10*3/uL (ref 0.0–0.1)
Basophils Relative: 1 %
Eosinophils Absolute: 0.1 10*3/uL (ref 0.0–0.5)
Eosinophils Relative: 1 %
HCT: 29.3 % — ABNORMAL LOW (ref 36.0–46.0)
Hemoglobin: 9.3 g/dL — ABNORMAL LOW (ref 12.0–15.0)
Immature Granulocytes: 0 %
Lymphocytes Relative: 38 %
Lymphs Abs: 2.4 10*3/uL (ref 0.7–4.0)
MCH: 31.3 pg (ref 26.0–34.0)
MCHC: 31.7 g/dL (ref 30.0–36.0)
MCV: 98.7 fL (ref 80.0–100.0)
Monocytes Absolute: 0.3 10*3/uL (ref 0.1–1.0)
Monocytes Relative: 4 %
Neutro Abs: 3.5 10*3/uL (ref 1.7–7.7)
Neutrophils Relative %: 56 %
Platelet Count: 222 10*3/uL (ref 150–400)
RBC: 2.97 MIL/uL — ABNORMAL LOW (ref 3.87–5.11)
RDW: 17.5 % — ABNORMAL HIGH (ref 11.5–15.5)
WBC Count: 6.3 10*3/uL (ref 4.0–10.5)
nRBC: 0 % (ref 0.0–0.2)

## 2022-02-07 LAB — CMP (CANCER CENTER ONLY)
ALT: 35 U/L (ref 0–44)
AST: 29 U/L (ref 15–41)
Albumin: 3.4 g/dL — ABNORMAL LOW (ref 3.5–5.0)
Alkaline Phosphatase: 469 U/L — ABNORMAL HIGH (ref 38–126)
Anion gap: 7 (ref 5–15)
BUN: 12 mg/dL (ref 8–23)
CO2: 27 mmol/L (ref 22–32)
Calcium: 9.2 mg/dL (ref 8.9–10.3)
Chloride: 105 mmol/L (ref 98–111)
Creatinine: 0.58 mg/dL (ref 0.44–1.00)
GFR, Estimated: >60 mL/min (ref >60)
Glucose, Bld: 182 mg/dL — ABNORMAL HIGH (ref 70–99)
Potassium: 4.1 mmol/L (ref 3.5–5.1)
Sodium: 139 mmol/L (ref 135–145)
Total Bilirubin: 6.8 mg/dL (ref 0.3–1.2)
Total Protein: 6.8 g/dL (ref 6.5–8.1)

## 2022-02-07 LAB — GENETIC SCREENING ORDER

## 2022-02-07 NOTE — Progress Notes (Signed)
REFERRING PROVIDER: ?Truitt Merle, MD ?[address] ?[place],  [place] [address] ? ?PRIMARY PROVIDER:  ?[name], MD ? ?PRIMARY REASON FOR VISIT:  ?1. Family history of colon cancer   ?2. Family history of breast cancer   ?3. Family history of prostate cancer   ?4. Malignant neoplasm of head of pancreas ([place])   ? ? ? ?HISTORY OF PRESENT ILLNESS:   ?Miranda Francis, a 76 y.o. female, was seen for a [facility] cancer genetics consultation at the request of Dr. Burr Medico due to a personal and family history of cancer.  [name] presents to clinic today to discuss the possibility of a hereditary predisposition to cancer, genetic testing, and to further clarify her future cancer risks, as well as potential cancer risks for family members.  ? ?In March 2023, at the age of 81, [name] was diagnosed with pancreatic cancer.  ? ? ?CANCER HISTORY:  ?Oncology History Overview Note  ? Cancer Staging  ?Pancreatic cancer ([place]) ?Staging form: Exocrine Pancreas, AJCC 8th Edition ?- Clinical stage from 01/18/2022: Stage IB (cT2, cN0, cM0) - Signed by Truitt Merle, MD on 01/28/2022 ? ?  ?Pancreatic cancer [facility])  ?01/16/2022 Imaging  ? CLINICAL DATA:  Jaundice ?  ?EXAM: ?CT ABDOMEN AND PELVIS WITH CONTRAST ? ?IMPRESSION: ?1. Marked intra and extrahepatic biliary dilatation with marked ?dilatation of pancreatic duct and distended gallbladder. There is ?significant atrophy of the body and tail of pancreas. Ill-defined ?soft tissue mass at the pancreatic head neck region with marked ?narrowing of the SMV by ill-defined mass, constellation of findings ?concerning for neoplasm/pancreas carcinoma. ?2. Diverticular disease of the colon without acute wall thickening ?3. Mild endometrial thickening up to 9 mm, recommend nonemergent pelvic ultrasound for further evaluation ?  ?01/17/2022 Imaging  ? EXAM: ?MRI ABDOMEN WITHOUT AND WITH CONTRAST (INCLUDING MRCP) ? ?IMPRESSION: ?1. Obstructing mass within the pancreatic head with an  intraluminal ?component demonstrating low level enhancement. 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 ?2. This mass causes significant extrinsic narrowing at the ?portal/superior mesenteric vein confluence as well as marked intra ?and extrahepatic biliary dilatation. ?3. No distant metastases identified. ?4. Endoscopy for endoscopic ultrasound, tissue sampling and biliary decompression recommended. ?  ?01/18/2022 Cancer Staging  ? Staging form: Exocrine Pancreas, AJCC 8th Edition ?- Clinical stage from 01/18/2022: Stage IB (cT2, cN0, cM0) - Signed by Truitt Merle, MD on 01/28/2022 ?Stage prefix: Initial diagnosis ?Total positive nodes: 0 ? ?  ?01/18/2022 Initial Biopsy  ? FINAL MICROSCOPIC DIAGNOSIS:  ?A. PANCREAS, HEAD, FINE NEEDLE ASPIRATION:  ?- Malignant cells present consistent with adenocarcinoma ?  ?01/24/2022 Imaging  ? EXAM: ?CT CHEST WITHOUT CONTRAST ? ?IMPRESSION: ?1. No evidence of metastatic disease in the chest. ?2. Atelectasis or consolidation in the right lower lung. ?3. Healing right rib fractures. ?4. Mass in the head of the pancreas. See previous report of CT ?abdomen and pelvis 01/16/2022. Interval placement of the biliary ?stent. ?  ?01/28/2022 Initial Diagnosis  ? Pancreatic cancer [facility]) ?  ?02/12/2022 -  Chemotherapy  ? Patient is on Treatment Plan : PANCREATIC Abraxane / Gemcitabine D1,8 q21d  ?   ? ? ? ?RISK FACTORS:  ?Menarche was at age 76.  ?First live birth at age 89.  ?OCP use for approximately 0 years.  ?Ovaries intact: yes.  ?Hysterectomy: no.  ?Menopausal status: postmenopausal.  ?HRT use: 0 years. ?  Colonoscopy: yes; normal. ?Mammogram within the last year: yes. ?Number of breast biopsies: 0. ?Up to date with pelvic exams: yes. ?Any excessive radiation exposure in the past: no ? ?Past Medical History:  ?Diagnosis Date  ? Blood  transfusion   ? Diabetes mellitus   ? Family history of breast cancer   ? Family history of colon cancer   ? Family history of prostate cancer   ? Heart murmur   ? Hypertension   ? ? ?Past Surgical History:  ?Procedure Laterality Date  ? BILIARY STENT PLACEMENT  01/18/2022  ? Procedure: BILIARY STENT PLACEMENT;  Surgeon: Carol Ada, MD;  Location: [place];  Service: Gastroenterology;;  ? DILATION AND CURETTAGE OF UTERUS    ? ERCP N/A 01/18/2022  ? Procedure: ENDOSCOPIC RETROGRADE CHOLANGIOPANCREATOGRAPHY (ERCP);  Surgeon: Carol Ada, MD;  Location: [place];  Service: Gastroenterology;  Laterality: N/A;  ? ESOPHAGOGASTRODUODENOSCOPY N/A 01/18/2022  ? Procedure: ESOPHAGOGASTRODUODENOSCOPY (EGD);  Surgeon: Carol Ada, MD;  Location: [place];  Service: Gastroenterology;  Laterality: N/A;  ? EUS  01/18/2022  ? Procedure: UPPER ENDOSCOPIC ULTRASOUND (EUS) LINEAR;  Surgeon: Carol Ada, MD;  Location: [place];  Service: Gastroenterology;;  ? FINE NEEDLE ASPIRATION  01/18/2022  ? Procedure: FINE NEEDLE ASPIRATION (FNA) LINEAR;  Surgeon: Carol Ada, MD;  Location: [place];  Service: Gastroenterology;;  ? NO PAST SURGERIES    ? SPHINCTEROTOMY  01/18/2022  ? Procedure: SPHINCTEROTOMY;  Surgeon: Carol Ada, MD;  Location: [place];  Service: Gastroenterology;;  ? ? ?Social History  ? ?Socioeconomic History  ? Marital status: Married  ?  Spouse name: Not on file  ? Number of children: 2  ? Years of education: Not on file  ? Highest education level: Not on file  ?Occupational History  ? Not on file  ?Tobacco Use  ? Smoking status: Never  ? Smokeless tobacco: Never  ?Substance and Sexual Activity  ? Alcohol use: Never  ? Drug use: Never  ? Sexual activity: Not on file  ?Other Topics Concern  ? Not on file  ?Social History Narrative  ? Not on file  ? ?Social Determinants of Health  ? ?Financial Resource Strain: Not on file  ?Food Insecurity: Not on file  ?Transportation Needs: Not on file   ?Physical Activity: Not on file  ?Stress: Not on file  ?Social Connections: Not on file  ?  ? ?FAMILY HISTORY:  ?We obtained a detailed, 4-generation family history.  Significant diagnoses are listed below: ?Family History  ?Problem Relation Age of Onset  ? Diabetes type II Mother   ? Stroke Father   ? Diabetes type II Sister   ? Colon cancer Sister 49  ? Colon cancer Brother 12  ? Breast cancer Maternal Aunt 90  ? Breast cancer Maternal Aunt 39  ? Prostate cancer Maternal Uncle   ? Heart attack Maternal Grandmother   ? ? ?The patient has a son and daughter who are cancer free.  She has five sisters and four brothers.  One sister and one brother had colon cancer in their 50's.  The patient's parents are deceased. ? ?The patient's father died of a stroke.  The patient does not have a lot of information about the paternal side of the family. ? ?The patient's mother died from complications of diabetes. She had multiple siblings, one brother had prostate cancer and two sisters had breast cancer in their 46's.  There is no other cancer known in the family. ? ?Ms. Fischbach is unaware of  previous family history of genetic testing for hereditary cancer risks. Patient's maternal ancestors are of African American descent, and paternal ancestors are of African American descent. There is no reported Ashkenazi Jewish ancestry. There is no known consanguinity. ? ?GENETIC COUNSELING ASSESSMENT: Ms. Tozer is a 76 y.o. female with a personal and family history of cancer which is somewhat suggestive of a hereditary cancer syndrome and predisposition to cancer given the combination of cancer. We, therefore, discussed and recommended the following at today's visit.  ? ?DISCUSSION: We discussed that, in general, most cancer is not inherited in families, but instead is sporadic or familial. Sporadic cancers occur by chance and typically happen at older ages (>50 years) as this type of cancer is caused by genetic changes acquired  during an individual?s lifetime. Some families have more cancers than would be expected by chance; however, the ages or types of cancer are not consistent with a known genetic mutation or known genetic mutations have been rule

## 2022-02-08 ENCOUNTER — Other Ambulatory Visit (HOSPITAL_BASED_OUTPATIENT_CLINIC_OR_DEPARTMENT_OTHER): Payer: Self-pay

## 2022-02-14 ENCOUNTER — Inpatient Hospital Stay: Payer: Medicare HMO | Attending: Hematology | Admitting: Dietician

## 2022-02-14 ENCOUNTER — Other Ambulatory Visit: Payer: Self-pay

## 2022-02-14 ENCOUNTER — Inpatient Hospital Stay: Payer: Medicare HMO

## 2022-02-14 ENCOUNTER — Other Ambulatory Visit: Payer: Self-pay | Admitting: Radiology

## 2022-02-14 DIAGNOSIS — R748 Abnormal levels of other serum enzymes: Secondary | ICD-10-CM | POA: Insufficient documentation

## 2022-02-14 DIAGNOSIS — R630 Anorexia: Secondary | ICD-10-CM | POA: Insufficient documentation

## 2022-02-14 DIAGNOSIS — Z7984 Long term (current) use of oral hypoglycemic drugs: Secondary | ICD-10-CM | POA: Insufficient documentation

## 2022-02-14 DIAGNOSIS — K5730 Diverticulosis of large intestine without perforation or abscess without bleeding: Secondary | ICD-10-CM | POA: Insufficient documentation

## 2022-02-14 DIAGNOSIS — D649 Anemia, unspecified: Secondary | ICD-10-CM | POA: Insufficient documentation

## 2022-02-14 DIAGNOSIS — K831 Obstruction of bile duct: Secondary | ICD-10-CM | POA: Insufficient documentation

## 2022-02-14 DIAGNOSIS — C250 Malignant neoplasm of head of pancreas: Secondary | ICD-10-CM | POA: Insufficient documentation

## 2022-02-14 DIAGNOSIS — I10 Essential (primary) hypertension: Secondary | ICD-10-CM | POA: Insufficient documentation

## 2022-02-14 DIAGNOSIS — Z79899 Other long term (current) drug therapy: Secondary | ICD-10-CM | POA: Insufficient documentation

## 2022-02-14 DIAGNOSIS — Z5111 Encounter for antineoplastic chemotherapy: Secondary | ICD-10-CM | POA: Insufficient documentation

## 2022-02-14 DIAGNOSIS — E119 Type 2 diabetes mellitus without complications: Secondary | ICD-10-CM | POA: Insufficient documentation

## 2022-02-14 NOTE — Progress Notes (Signed)
Nutrition Assessment ? ? ?Reason for Assessment: Poor appetite  ? ? ?ASSESSMENT: 76 year old female with newly diagnosed borderline resectable pancreatic cancer. She is pending start of neoadjuvant chemotherapy with abraxane/gemcitabine q21d. First treatment planned for 4/7. Patient is under the care of Dr. Burr Medico.  ? ?Met with patient and husband in office. Patient reports good days and bad days. Today is not a good day for her as she is having pain. Patient is not eating much, recalls 2 small meals is typical for her. This morning she ate a piece of toast with glass of juice. Patient had fried fish and shrimp with piece of whole grain bread and a glass of water for supper. She is asking what oral nutrition supplement would be best to drink with her diabetes. She has been drinking Glucerna, but not consistently. Patient expresses concern about her sugar. Patient reports PCP switched her diabetes medications ~one month ago after presenting to office with yellowing of her hands. She is now taking glipizide which she was instructed to take prior to meal times. Patient reports she is unclear of how much or if she should be taking medication at all with decreased appetite. Patient reports previously on metformin once daily. This was working well for her, noted recent A1c of 4.6 on 01/17/22.  ? ?Nutrition Focused Physical Exam:  ? ?Orbital Region: mild ?Buccal Region: mild  ?Temple Region: mild ?Dorsal Hand: mild  ?Hair: reviewed  ?Eyes: reviewed  ?Mouth: reviewed  ?Skin: reviewed  ?Nails: reviewed  ? ? ?Medications: zofran, compazine, oxycodone, miralax, D3, glipizide, klor-con ? ? ?Labs: 3/29 - glucose 182 ?01/18/22 HgbA1c - 4.6 (indicates excellent control) ? ? ?Anthropometrics: Patient is 21% (42 lbs) under reported usual weight; significant  ? ?Height: 5'3" ?Weight: 158 lb 9.6 oz (01/29/22) ?UBW: 200 lb (per pt - no recent wt history for review, noted 210 lb in 2019) ?BMI: 28.09 ? ? ?NUTRITION DIAGNOSIS: Food and nutrition  related knowledge deficit related to newly diagnosed pancreatic cancer as evidenced by no prior need for related nutrition information  ? ? ?INTERVENTION:  ?Educated on the importance of adequate calories and protein to maintain strength/weights ?Discussed strategies for poor appetite and encouraged small frequent meals and snacks - handout with tips and snack ideas provided ?Suggested soft moist high protein foods for ease of intake given fatigue - handout with ideas provided ?Educated on nutrient profiles of oral nutrition supplements, recommend 2 Ensure Plus/equivalent - samples of Ensure Plus and D[employer] [place] provided ?Recommend patient contact her PCP to clarify dosing for glipizide and discuss if switching back to metformin would be appropriate  ?Support and encouragement provided ?Contact information given  ? ? ?MONITORING, EVALUATION, GOAL: Patient will tolerate increased calories and protein to minimize weight loss  ? ? ?Next Visit: To be scheduled  ? ? ? ? ?

## 2022-02-15 ENCOUNTER — Encounter (HOSPITAL_COMMUNITY): Payer: Self-pay

## 2022-02-15 ENCOUNTER — Ambulatory Visit (HOSPITAL_COMMUNITY)
Admission: RE | Admit: 2022-02-15 | Discharge: 2022-02-15 | Disposition: A | Discharge status: Home / Self Care | Payer: Medicare HMO | Source: Ambulatory Visit | Attending: Hematology | Admitting: Hematology

## 2022-02-15 ENCOUNTER — Other Ambulatory Visit: Payer: Self-pay

## 2022-02-15 ENCOUNTER — Encounter: Payer: Self-pay | Admitting: Hematology

## 2022-02-15 DIAGNOSIS — E119 Type 2 diabetes mellitus without complications: Secondary | ICD-10-CM | POA: Insufficient documentation

## 2022-02-15 DIAGNOSIS — Z7984 Long term (current) use of oral hypoglycemic drugs: Secondary | ICD-10-CM | POA: Insufficient documentation

## 2022-02-15 DIAGNOSIS — Z79899 Other long term (current) drug therapy: Secondary | ICD-10-CM | POA: Diagnosis not present

## 2022-02-15 DIAGNOSIS — C259 Malignant neoplasm of pancreas, unspecified: Secondary | ICD-10-CM | POA: Insufficient documentation

## 2022-02-15 DIAGNOSIS — I10 Essential (primary) hypertension: Secondary | ICD-10-CM | POA: Insufficient documentation

## 2022-02-15 DIAGNOSIS — C250 Malignant neoplasm of head of pancreas: Secondary | ICD-10-CM

## 2022-02-15 HISTORY — PX: IR IMAGING GUIDED PORT INSERTION: IMG5740

## 2022-02-15 LAB — GLUCOSE, CAPILLARY: Glucose-Capillary: 190 mg/dL — ABNORMAL HIGH (ref 70–99)

## 2022-02-15 MED ORDER — FENTANYL CITRATE (PF) 100 MCG/2ML IJ SOLN
INTRAMUSCULAR | Status: AC | PRN
  Administered 2022-02-15: 50 ug via INTRAVENOUS

## 2022-02-15 MED ORDER — HEPARIN SOD (PORK) LOCK FLUSH 100 UNIT/ML IV SOLN
INTRAVENOUS | Status: AC | PRN
  Administered 2022-02-15: 500 [IU] via INTRAVENOUS

## 2022-02-15 MED ORDER — FENTANYL CITRATE (PF) 100 MCG/2ML IJ SOLN
INTRAMUSCULAR | Status: AC
  Filled 2022-02-15: qty 2

## 2022-02-15 MED ORDER — HEPARIN SOD (PORK) LOCK FLUSH 100 UNIT/ML IV SOLN
INTRAVENOUS | Status: AC
  Filled 2022-02-15: qty 5

## 2022-02-15 MED ORDER — LIDOCAINE-EPINEPHRINE 1 %-1:100000 IJ SOLN
INTRAMUSCULAR | Status: AC | PRN
  Administered 2022-02-15: 10 mL via INTRADERMAL

## 2022-02-15 MED ORDER — SODIUM CHLORIDE 0.9 % IV SOLN: INTRAVENOUS | Status: DC

## 2022-02-15 MED ORDER — MIDAZOLAM HCL 2 MG/2ML IJ SOLN
INTRAMUSCULAR | Status: AC | PRN
  Administered 2022-02-15: 1 mg via INTRAVENOUS

## 2022-02-15 MED ORDER — MIDAZOLAM HCL 2 MG/2ML IJ SOLN
INTRAMUSCULAR | Status: DC
Start: 2022-02-15 — End: 2022-02-16
  Filled 2022-02-15: qty 2

## 2022-02-15 MED ORDER — LIDOCAINE-EPINEPHRINE 1 %-1:100000 IJ SOLN
INTRAMUSCULAR | Status: AC
  Filled 2022-02-15: qty 1

## 2022-02-15 NOTE — Procedures (Signed)
Vascular and Interventional Radiology Procedure Note ? ?Patient: Miranda Francis ?DOB: 24-Oct-1946 ?Medical Record Number: [id number] ?Note Date/Time: 02/15/22 4:09 PM  ? ?Performing Physician: Michaelle Birks, MD ?Assistant(s): None ? ?Diagnosis: Pancreatic cancer ? ?Procedure: PORT PLACEMENT ? ?Anesthesia: Conscious Sedation ?Complications: None ?Estimated Blood Loss: Minimal ? ?Findings:  ?Successful right-sided port placement, with the tip of the catheter in the proximal right atrium. ? ?Plan: Catheter ready for use. ? ?See detailed procedure note with images in PACS. ?The patient tolerated the procedure well without incident or complication and was returned to Recovery in stable condition.  ? ? ?Michaelle Birks, MD ?[facility] ?[facility] ? ? ?Pager. [phone] ?Clinic. [phone]  ?

## 2022-02-15 NOTE — Consult Note (Signed)
? ?Chief Complaint: ?Patient was seen in consultation today for port a cath placement ? ?Referring Physician(s): ?Feng,Yan ? ?Supervising Physician: Michaelle Birks ? ?Patient Status: [place] [place] ? ?History of Present Illness: ?Miranda Francis is a 76 y.o. female with past medical history of diabetes, hypertension and now with newly diagnosed pancreatic carcinoma who presents today for Port-A-Cath placement for chemotherapy. ? ?Past Medical History:  ?Diagnosis Date  ? Blood transfusion   ? Diabetes mellitus   ? Family history of breast cancer   ? Family history of colon cancer   ? Family history of prostate cancer   ? Heart murmur   ? Hypertension   ? ? ?Past Surgical History:  ?Procedure Laterality Date  ? BILIARY STENT PLACEMENT  01/18/2022  ? Procedure: BILIARY STENT PLACEMENT;  Surgeon: Carol Ada, MD;  Location: [facility];  Service: Gastroenterology;;  ? DILATION AND CURETTAGE OF UTERUS    ? ERCP N/A 01/18/2022  ? Procedure: ENDOSCOPIC RETROGRADE CHOLANGIOPANCREATOGRAPHY (ERCP);  Surgeon: Carol Ada, MD;  Location: [facility];  Service: Gastroenterology;  Laterality: N/A;  ? ESOPHAGOGASTRODUODENOSCOPY N/A 01/18/2022  ? Procedure: ESOPHAGOGASTRODUODENOSCOPY (EGD);  Surgeon: Carol Ada, MD;  Location: [facility];  Service: Gastroenterology;  Laterality: N/A;  ? EUS  01/18/2022  ? Procedure: UPPER ENDOSCOPIC ULTRASOUND (EUS) LINEAR;  Surgeon: Carol Ada, MD;  Location: [facility];  Service: Gastroenterology;;  ? FINE NEEDLE ASPIRATION  01/18/2022  ? Procedure: FINE NEEDLE ASPIRATION (FNA) LINEAR;  Surgeon: Carol Ada, MD;  Location: [facility];  Service: Gastroenterology;;  ? NO PAST SURGERIES    ? SPHINCTEROTOMY  01/18/2022  ? Procedure: SPHINCTEROTOMY;  Surgeon: Carol Ada, MD;  Location: [facility];  Service: Gastroenterology;;  ? ? ?Allergies: ?Patient has no known allergies. ? ?Medications: ?Prior to Admission medications   ?Medication Sig Start Date End Date Taking? Authorizing Provider   ?acetaminophen (TYLENOL) 500 MG tablet Take 1,000 mg by mouth every 6 (six) hours as needed for moderate pain or headache.   Yes [provider]  ?amLODipine (NORVASC) 5 MG tablet Take 5 mg by mouth daily. 10/17/21  Yes [provider]  ?Cholecalciferol (QC VITAMIN D3) 50 MCG (2000 UT) TABS Take 2,000 Units by mouth daily.   Yes [provider]  ?cholestyramine light (PREVALITE) 4 g packet Take 1 packet (4 g total) by mouth 2 (two) times daily. 01/25/22 02/24/22 Yes Shelly Coss, MD  ?glipiZIDE (GLUCOTROL) 5 MG tablet Take 2.5 mg by mouth 2 (two) times daily with a meal. 12/18/21  Yes [provider]  ?ibuprofen (ADVIL) 200 MG tablet Take 400 mg by mouth every 6 (six) hours as needed for moderate pain or headache.   Yes [provider]  ?KLOR-CON M20 20 MEQ tablet Take 20 mEq by mouth 2 (two) times daily. 01/14/22  Yes [provider]  ?oxyCODONE (OXY IR/ROXICODONE) 5 MG immediate release tablet Take 1 tablet (5 mg total) by mouth every 3 (three) hours as needed for severe pain. 01/25/22  Yes Shelly Coss, MD  ?polyethylene glycol (MIRALAX / GLYCOLAX) 17 g packet Take 17 g by mouth daily. 01/26/22  Yes Shelly Coss, MD  ?lidocaine-prilocaine (EMLA) cream Apply to affected area once 01/30/22   Truitt Merle, MD  ?ondansetron (ZOFRAN) 8 MG tablet Take 1 tablet (8 mg total) by mouth 2 (two) times daily as needed (Nausea or vomiting). 01/30/22   Truitt Merle, MD  ?prochlorperazine (COMPAZINE) 10 MG tablet Take 1 tablet (10 mg total) by mouth every 6 (six) hours as needed (Nausea  or vomiting). 01/30/22   Truitt Merle, MD  ?  ? ?Family History  ?Problem Relation Age of Onset  ? Diabetes type II Mother   ? Stroke Father   ? Diabetes type II Sister   ? Colon cancer Sister 58  ? Colon cancer Brother 42  ? Breast cancer Maternal Aunt 90  ? Breast cancer Maternal Aunt 16  ? Prostate cancer Maternal Uncle   ? Heart attack Maternal Grandmother   ? ? ?Social History  ? ?Socioeconomic  History  ? Marital status: Married  ?  Spouse name: Not on file  ? Number of children: 2  ? Years of education: Not on file  ? Highest education level: Not on file  ?Occupational History  ? Not on file  ?Tobacco Use  ? Smoking status: Never  ? Smokeless tobacco: Never  ?Substance and Sexual Activity  ? Alcohol use: Never  ? Drug use: Never  ? Sexual activity: Not on file  ?Other Topics Concern  ? Not on file  ?Social History Narrative  ? Not on file  ? ?Social Determinants of Health  ? ?Financial Resource Strain: Not on file  ?Food Insecurity: Not on file  ?Transportation Needs: Not on file  ?Physical Activity: Not on file  ?Stress: Not on file  ?Social Connections: Not on file  ? ? ? ? ? ?Review of Systems currently denies fever, headache, chest pain, dyspnea, cough, back pain, nausea, vomiting or bleeding.  Does have jaundice and some intermittent abdominal pain ? ?Vital Signs: ?BP (!) 145/85   Pulse 63   Temp 98.5 ?F (36.9 ?C) (Oral)   Resp 16   Ht '5\' 3"'$  (1.6 m)   Wt 154 lb (69.9 kg)   SpO2 100%   BMI 27.28 kg/m?  ? ?Physical Exam awake, alert.  Scleral icterus noted.  Chest clear to auscultation bilaterally.  Heart with regular rate and rhythm.  Abdomen soft, positive bowel sounds, mild epigastric tenderness to palpation; no significant lower extremity edema ? ?Imaging: ?CT CHEST WO CONTRAST ? ?Result Date: 01/24/2022 ?CLINICAL DATA:  Pancreatic cancer staging EXAM: CT CHEST WITHOUT CONTRAST TECHNIQUE: Multidetector CT imaging of the chest was performed following the standard protocol without IV contrast. RADIATION DOSE REDUCTION: This exam was performed according to the departmental dose-optimization program which includes automated exposure control, adjustment of the mA and/or kV according to patient size and/or use of iterative reconstruction technique. COMPARISON:  CT abdomen and pelvis 01/16/2022. Chest radiograph 10/29/2011 FINDINGS: Cardiovascular: Normal heart size. No pericardial effusions. Normal  caliber thoracic aorta. Scattered aortic and coronary artery calcifications. Mediastinum/Nodes: Esophagus is decompressed. No significant lymphadenopathy demonstrated in the chest. Scattered lymph nodes are not pathologically enlarged. Thyroid gland is unremarkable. Lungs/Pleura: Atelectasis or consolidation in the right lung base. No pulmonary nodules are identified. Airways appear patent. No pleural effusions. Upper Abdomen: Mass in the pancreatic head as seen on prior CT abdomen and pelvis, incompletely included within the field of view. A bile duct stent is present. Gas in the gallbladder and bile ducts is consistent with instrumentation. Musculoskeletal: Degenerative changes in the spine. Healing fractures of the right anterolateral fifth and sixth ribs. No destructive bone lesions. IMPRESSION: 1. No evidence of metastatic disease in the chest. 2. Atelectasis or consolidation in the right lower lung. 3. Healing right rib fractures. 4. Mass in the head of the pancreas. See previous report of CT abdomen and pelvis 01/16/2022. Interval placement of the biliary stent. Electronically Signed   By: Lucienne Capers  M.D.   On: 01/24/2022 02:58  ? ?CT ABDOMEN PELVIS W CONTRAST ? ?Result Date: 01/16/2022 ?CLINICAL DATA:  Jaundice EXAM: CT ABDOMEN AND PELVIS WITH CONTRAST TECHNIQUE: Multidetector CT imaging of the abdomen and pelvis was performed using the standard protocol following bolus administration of intravenous contrast. RADIATION DOSE REDUCTION: This exam was performed according to the departmental dose-optimization program which includes automated exposure control, adjustment of the mA and/or kV according to patient size and/or use of iterative reconstruction technique. CONTRAST:  169m OMNIPAQUE IOHEXOL 300 MG/ML  SOLN COMPARISON:  CT 10/31/2018 FINDINGS: Lower chest: Lung bases demonstrate no acute consolidation or effusion. Normal cardiac size. Hepatobiliary: Marked intra hepatic biliary dilatation. Dilated  common bile duct measuring up to 16 mm. Distended gallbladder without stones. Pancreas: Atrophic body and tail with marked pancreatic ductal dilatation up to 13 mm. Ill-defined hypodense mass at the pancreatic he

## 2022-02-15 NOTE — Progress Notes (Signed)
Patient informed that IR is running about 1 hour behind, patient voiced understanding, gave patient another blanket at her request.  Called her husband and left voicemail that IR running behind. ?

## 2022-02-15 NOTE — Discharge Instructions (Signed)
For questions /concerns may call Interventional Radiology at [phone]  You may remove your dressing and shower tomorrow afternoon  DO NOT use EMLA cream for 2 weeks after port placement as the cream will remove surgical glue on your incision.     Moderate Conscious Sedation, Adult, Care After This sheet gives you information about how to care for yourself after your procedure. Your health care provider may also give you more specific instructions. If you have problems or questions, contact your health careprovider. What can I expect after the procedure? After the procedure, it is common to have: Sleepiness for several hours. Impaired judgment for several hours. Difficulty with balance. Vomiting if you eat too soon. Follow these instructions at home: For the time period you were told by your health care provider: Rest. Do not participate in activities where you could fall or become injured. Do not drive or use machinery. Do not drink alcohol. Do not take sleeping pills or medicines that cause drowsiness. Do not make important decisions or sign legal documents. Do not take care of children on your own. Eating and drinking  Follow the diet recommended by your health care provider. Drink enough fluid to keep your urine pale yellow. If you vomit: Drink water, juice, or soup when you can drink without vomiting. Make sure you have little or no nausea before eating solid foods.  General instructions Take over-the-counter and prescription medicines only as told by your health care provider. Have a responsible adult stay with you for the time you are told. It is important to have someone help care for you until you are awake and alert. Do not smoke. Keep all follow-up visits as told by your health care provider. This is important. Contact a health care provider if: You are still sleepy or having trouble with balance after 24 hours. You feel light-headed. You keep feeling nauseous or  you keep vomiting. You develop a rash. You have a fever. You have redness or swelling around the IV site. Get help right away if: You have trouble breathing. You have new-onset confusion at home. Summary After the procedure, it is common to feel sleepy, have impaired judgment, or feel nauseous if you eat too soon. Rest after you get home. Know the things you should not do after the procedure. Follow the diet recommended by your health care provider and drink enough fluid to keep your urine pale yellow. Get help right away if you have trouble breathing or new-onset confusion at home. This information is not intended to replace advice given to you by your health care provider. Make sure you discuss any questions you have with your healthcare provider.  Implanted Port Insertion, Care After This sheet gives you information about how to care for yourself after your procedure. Your health care provider may also give you more specific instructions. If you have problems or questions, contact your health careprovider. What can I expect after the procedure? After the procedure, it is common to have: Discomfort at the port insertion site. Bruising on the skin over the port. This should improve over 3-4 days. Follow these instructions at home: Port care After your port is placed, you will get a manufacturer's information card. The card has information about your port. Keep this card with you at all times. Take care of the port as told by your health care provider. Ask your health care provider if you or a family member can get training for taking care of the port at home. A   home health care nurse may also take care of the port. Make sure to remember what type of port you have. Incision care Follow instructions from your health care provider about how to take care of your port insertion site. Make sure you: Wash your hands with soap and water before and after you change your bandage (dressing). If soap  and water are not available, use hand sanitizer. Change your dressing as told by your health care provider. Leave skin glue, or adhesive strips in place. These skin closures may need to stay in place for 2 weeks or longer.  Check your port insertion site every day for signs of infection. Check for:      - Redness, swelling, or pain.                     - Fluid or blood.      - Warmth.      - Pus or a bad smell. Activity Return to your normal activities as told by your health care provider. Ask your health care provider what activities are safe for you. Do not lift anything that is heavier than 10 lb (4.5 kg), or the limit that you are told, until your health care provider says that it is safe. General instructions Take over-the-counter and prescription medicines only as told by your health care provider. Do not take baths, swim, or use a hot tub until your health care provider approves. Ask your health care provider if you may take showers. You may only be allowed to take sponge baths. Do not drive for 24 hours if you were given a sedative during your procedure. Wear a medical alert bracelet in case of an emergency. This will tell any health care providers that you have a port. Keep all follow-up visits as told by your health care provider. This is important. Contact a health care provider if: You cannot flush your port with saline as directed, or you cannot draw blood from the port. You have a fever or chills. You have redness, swelling, or pain around your port insertion site. You have fluid or blood coming from your port insertion site. Your port insertion site feels warm to the touch. You have pus or a bad smell coming from the port insertion site. Get help right away if: You have chest pain or shortness of breath. You have bleeding from your port that you cannot control. Summary Take care of the port as told by your health care provider. Keep the manufacturer's information card with  you at all times. Change your dressing as told by your health care provider. Contact a health care provider if you have a fever or chills or if you have redness, swelling, or pain around your port insertion site. Keep all follow-up visits as told by your health care provider. This information is not intended to replace advice given to you by your health care provider. Make sure you discuss any questions you have with your healthcare provider. Document Revised: 05/27/2018 Document Reviewed: 05/27/2018  

## 2022-02-16 ENCOUNTER — Inpatient Hospital Stay: Payer: Medicare HMO

## 2022-02-16 ENCOUNTER — Encounter: Payer: Self-pay | Admitting: Genetic Counselor

## 2022-02-16 ENCOUNTER — Encounter: Payer: Self-pay | Admitting: Hematology

## 2022-02-16 ENCOUNTER — Telehealth: Payer: Self-pay | Admitting: Genetic Counselor

## 2022-02-16 ENCOUNTER — Inpatient Hospital Stay: Payer: Medicare HMO | Admitting: Hematology

## 2022-02-16 VITALS — BP 154/105 | HR 84 | Temp 99.3°F | Resp 16 | Wt 154.5 lb

## 2022-02-16 VITALS — BP 144/89 | HR 70 | Resp 18

## 2022-02-16 DIAGNOSIS — K831 Obstruction of bile duct: Secondary | ICD-10-CM | POA: Diagnosis not present

## 2022-02-16 DIAGNOSIS — R748 Abnormal levels of other serum enzymes: Secondary | ICD-10-CM | POA: Diagnosis not present

## 2022-02-16 DIAGNOSIS — Z95828 Presence of other vascular implants and grafts: Secondary | ICD-10-CM

## 2022-02-16 DIAGNOSIS — K5730 Diverticulosis of large intestine without perforation or abscess without bleeding: Secondary | ICD-10-CM | POA: Diagnosis not present

## 2022-02-16 DIAGNOSIS — E119 Type 2 diabetes mellitus without complications: Secondary | ICD-10-CM | POA: Diagnosis not present

## 2022-02-16 DIAGNOSIS — R630 Anorexia: Secondary | ICD-10-CM | POA: Diagnosis not present

## 2022-02-16 DIAGNOSIS — C250 Malignant neoplasm of head of pancreas: Secondary | ICD-10-CM

## 2022-02-16 DIAGNOSIS — Z5111 Encounter for antineoplastic chemotherapy: Secondary | ICD-10-CM | POA: Diagnosis present

## 2022-02-16 DIAGNOSIS — Z1379 Encounter for other screening for genetic and chromosomal anomalies: Secondary | ICD-10-CM | POA: Insufficient documentation

## 2022-02-16 DIAGNOSIS — I10 Essential (primary) hypertension: Secondary | ICD-10-CM | POA: Diagnosis not present

## 2022-02-16 DIAGNOSIS — Z79899 Other long term (current) drug therapy: Secondary | ICD-10-CM | POA: Diagnosis not present

## 2022-02-16 DIAGNOSIS — Z7984 Long term (current) use of oral hypoglycemic drugs: Secondary | ICD-10-CM | POA: Diagnosis not present

## 2022-02-16 DIAGNOSIS — D649 Anemia, unspecified: Secondary | ICD-10-CM | POA: Diagnosis not present

## 2022-02-16 LAB — CBC WITH DIFFERENTIAL (CANCER CENTER ONLY)
Abs Immature Granulocytes: 0.01 10*3/uL (ref 0.00–0.07)
Basophils Absolute: 0 10*3/uL (ref 0.0–0.1)
Basophils Relative: 1 %
Eosinophils Absolute: 0.1 10*3/uL (ref 0.0–0.5)
Eosinophils Relative: 2 %
HCT: 26.5 % — ABNORMAL LOW (ref 36.0–46.0)
Hemoglobin: 8.6 g/dL — ABNORMAL LOW (ref 12.0–15.0)
Immature Granulocytes: 0 %
Lymphocytes Relative: 43 %
Lymphs Abs: 2.4 10*3/uL (ref 0.7–4.0)
MCH: 32.2 pg (ref 26.0–34.0)
MCHC: 32.5 g/dL (ref 30.0–36.0)
MCV: 99.3 fL (ref 80.0–100.0)
Monocytes Absolute: 0.3 10*3/uL (ref 0.1–1.0)
Monocytes Relative: 6 %
Neutro Abs: 2.7 10*3/uL (ref 1.7–7.7)
Neutrophils Relative %: 48 %
Platelet Count: 177 10*3/uL (ref 150–400)
RBC: 2.67 MIL/uL — ABNORMAL LOW (ref 3.87–5.11)
RDW: 16.6 % — ABNORMAL HIGH (ref 11.5–15.5)
WBC Count: 5.6 10*3/uL (ref 4.0–10.5)
nRBC: 0 % (ref 0.0–0.2)

## 2022-02-16 LAB — CMP (CANCER CENTER ONLY)
ALT: 17 U/L (ref 0–44)
AST: 21 U/L (ref 15–41)
Albumin: 3.4 g/dL — ABNORMAL LOW (ref 3.5–5.0)
Alkaline Phosphatase: 299 U/L — ABNORMAL HIGH (ref 38–126)
Anion gap: 7 (ref 5–15)
BUN: 11 mg/dL (ref 8–23)
CO2: 25 mmol/L (ref 22–32)
Calcium: 9 mg/dL (ref 8.9–10.3)
Chloride: 107 mmol/L (ref 98–111)
Creatinine: 0.47 mg/dL (ref 0.44–1.00)
GFR, Estimated: >60 mL/min (ref >60)
Glucose, Bld: 223 mg/dL — ABNORMAL HIGH (ref 70–99)
Potassium: 3.8 mmol/L (ref 3.5–5.1)
Sodium: 139 mmol/L (ref 135–145)
Total Bilirubin: 4.2 mg/dL (ref 0.3–1.2)
Total Protein: 6.8 g/dL (ref 6.5–8.1)

## 2022-02-16 MED ORDER — SODIUM CHLORIDE 0.9 % IV SOLN
800.0000 mg/m2 | Freq: Once | INTRAVENOUS | Status: AC
  Administered 2022-02-16: 1444 mg via INTRAVENOUS
  Filled 2022-02-16: qty 37.98

## 2022-02-16 MED ORDER — SODIUM CHLORIDE 0.9% FLUSH
10.0000 mL | INTRAVENOUS | Status: DC | PRN
  Administered 2022-02-16: 10 mL

## 2022-02-16 MED ORDER — SODIUM CHLORIDE 0.9 % IV SOLN: Freq: Once | INTRAVENOUS | Status: AC

## 2022-02-16 MED ORDER — HEPARIN SOD (PORK) LOCK FLUSH 100 UNIT/ML IV SOLN
500.0000 [IU] | Freq: Once | INTRAVENOUS | Status: AC | PRN
  Administered 2022-02-16: 500 [IU]

## 2022-02-16 MED ORDER — SODIUM CHLORIDE 0.9% FLUSH
10.0000 mL | INTRAVENOUS | Status: DC | PRN
  Administered 2022-02-16: 10 mL via INTRAVENOUS

## 2022-02-16 MED ORDER — PROCHLORPERAZINE MALEATE 10 MG PO TABS
10.0000 mg | ORAL_TABLET | Freq: Once | ORAL | Status: AC
  Administered 2022-02-16: 10 mg via ORAL
  Filled 2022-02-16: qty 1

## 2022-02-16 NOTE — Telephone Encounter (Signed)
LM on VM that results are back and to please call. 

## 2022-02-16 NOTE — Patient Instructions (Signed)
[facility]  Discharge Instructions: ?Thank you for choosing [facility] to provide your oncology and hematology care.  ? ?If you have a lab appointment with the [facility], please go directly to the [facility] and check in at the registration area. ?  ?Wear comfortable clothing and clothing appropriate for easy access to any Portacath or PICC line.  ? ?We strive to give you quality time with your provider. You may need to reschedule your appointment if you arrive late (15 or more minutes).  Arriving late affects you and other patients whose appointments are after yours.  Also, if you miss three or more appointments without notifying the office, you may be dismissed from the clinic at the provider?s discretion.    ?  ?For prescription refill requests, have your pharmacy contact our office and allow 72 hours for refills to be completed.   ? ?Today you received the following chemotherapy and/or immunotherapy agents Gemzar    ?  ?To help prevent nausea and vomiting after your treatment, we encourage you to take your nausea medication as directed. ? ?BELOW ARE SYMPTOMS THAT SHOULD BE REPORTED IMMEDIATELY: ?*FEVER GREATER THAN 100.4 F (38 ?C) OR HIGHER ?*CHILLS OR SWEATING ?*NAUSEA AND VOMITING THAT IS NOT CONTROLLED WITH YOUR NAUSEA MEDICATION ?*UNUSUAL SHORTNESS OF BREATH ?*UNUSUAL BRUISING OR BLEEDING ?*URINARY PROBLEMS (pain or burning when urinating, or frequent urination) ?*BOWEL PROBLEMS (unusual diarrhea, constipation, pain near the anus) ?TENDERNESS IN MOUTH AND THROAT WITH OR WITHOUT PRESENCE OF ULCERS (sore throat, sores in mouth, or a toothache) ?UNUSUAL RASH, SWELLING OR PAIN  ?UNUSUAL VAGINAL DISCHARGE OR ITCHING  ? ?Items with * indicate a potential emergency and should be followed up as soon as possible or go to the Emergency Department if any problems should occur. ? ?Please show the CHEMOTHERAPY ALERT CARD or IMMUNOTHERAPY ALERT CARD at check-in to the  Emergency Department and triage nurse. ? ?Should you have questions after your visit or need to cancel or reschedule your appointment, please contact [facility]  Dept: [phone]  and follow the prompts.  Office hours are 8:00 a.m. to 4:30 p.m. Monday - Friday. Please note that voicemails left after 4:00 p.m. may not be returned until the following business day.  We are closed weekends and major holidays. You have access to a nurse at all times for urgent questions. Please call the main number to the clinic Dept: [phone] and follow the prompts. ? ? ?For any non-urgent questions, you may also contact your provider using MyChart. We now offer e-Visits for anyone 44 and older to request care online for non-urgent symptoms. For details visit [email or web address]. ?  ?Also download the MyChart app! Go to the app store, search "MyChart", open the app, select [facility], and log in with your MyChart username and password. ? ?Due to Covid, a mask is required upon entering the hospital/clinic. If you do not have a mask, one will be given to you upon arrival. For doctor visits, patients may have 1 support person aged 67 or older with them. For treatment visits, patients cannot have anyone with them due to current Covid guidelines and our immunocompromised population.  ? ?Gemcitabine injection ?What is this medication? ?GEMCITABINE (jem SYE ta been) is a chemotherapy drug. This medicine is used to treat many types of cancer like breast cancer, lung cancer, pancreatic cancer, and ovarian cancer. ?This medicine may be used for other purposes; ask your health care provider or  pharmacist if you have questions. ?COMMON BRAND NAME(S): Gemzar, Infugem ?What should I tell my care team before I take this medication? ?They need to know if you have any of these conditions: ?blood disorders ?infection ?kidney disease ?liver disease ?lung or breathing disease, like asthma ?recent or ongoing  radiation therapy ?an unusual or allergic reaction to gemcitabine, other chemotherapy, other medicines, foods, dyes, or preservatives ?pregnant or trying to get pregnant ?breast-feeding ?How should I use this medication? ?This drug is given as an infusion into a vein. It is administered in a hospital or clinic by a specially trained health care professional. ?Talk to your pediatrician regarding the use of this medicine in children. Special care may be needed. ?Overdosage: If you think you have taken too much of this medicine contact a poison control center or emergency room at once. ?NOTE: This medicine is only for you. Do not share this medicine with others. ?What if I miss a dose? ?It is important not to miss your dose. Call your doctor or health care professional if you are unable to keep an appointment. ?What may interact with this medication? ?medicines to increase blood counts like filgrastim, pegfilgrastim, sargramostim ?some other chemotherapy drugs like cisplatin ?vaccines ?Talk to your doctor or health care professional before taking any of these medicines: ?acetaminophen ?aspirin ?ibuprofen ?ketoprofen ?naproxen ?This list may not describe all possible interactions. Give your health care provider a list of all the medicines, herbs, non-prescription drugs, or dietary supplements you use. Also tell them if you smoke, drink alcohol, or use illegal drugs. Some items may interact with your medicine. ?What should I watch for while using this medication? ?Visit your doctor for checks on your progress. This drug may make you feel generally unwell. This is not uncommon, as chemotherapy can affect healthy cells as well as cancer cells. Report any side effects. Continue your course of treatment even though you feel ill unless your doctor tells you to stop. ?In some cases, you may be given additional medicines to help with side effects. Follow all directions for their use. ?Call your doctor or health care  professional for advice if you get a fever, chills or sore throat, or other symptoms of a cold or flu. Do not treat yourself. This drug decreases your body's ability to fight infections. Try to avoid being around people who are sick. ?This medicine may increase your risk to bruise or bleed. Call your doctor or health care professional if you notice any unusual bleeding. ?Be careful brushing and flossing your teeth or using a toothpick because you may get an infection or bleed more easily. If you have any dental work done, tell your dentist you are receiving this medicine. ?Avoid taking products that contain aspirin, acetaminophen, ibuprofen, naproxen, or ketoprofen unless instructed by your doctor. These medicines may hide a fever. ?Do not become pregnant while taking this medicine or for 6 months after stopping it. Women should inform their doctor if they wish to become pregnant or think they might be pregnant. Men should not father a child while taking this medicine and for 3 months after stopping it. There is a potential for serious side effects to an unborn child. Talk to your health care professional or pharmacist for more information. Do not breast-feed an infant while taking this medicine or for at least 1 week after stopping it. ?Men should inform their doctors if they wish to father a child. This medicine may lower sperm counts. Talk with your doctor or health care  professional if you are concerned about your fertility. ?What side effects may I notice from receiving this medication? ?Side effects that you should report to your doctor or health care professional as soon as possible: ?allergic reactions like skin rash, itching or hives, swelling of the face, lips, or tongue ?breathing problems ?pain, redness, or irritation at site where injected ?signs and symptoms of a dangerous change in heartbeat or heart rhythm like chest pain; dizziness; fast or irregular heartbeat; palpitations; feeling faint or  lightheaded, falls; breathing problems ?signs of decreased platelets or bleeding - bruising, pinpoint red spots on the skin, black, tarry stools, blood in the urine ?signs of decreased red blood cells - unusually weak or tire

## 2022-02-16 NOTE — Progress Notes (Signed)
?[place]   ?[phone] [phone]   ?Clinic Follow up Note  ? ?Patient Care Team: ?[name], MD as PCP - General (Internal Medicine) ?Truitt Merle, MD as Consulting Physician (Oncology) ?Earl Gala, Deliah Goody, RN as Sales executive (Oncology) ? ?Date of Service:  02/16/2022 ? ?CHIEF COMPLAINT: f/u of pancreatic cancer ? ?CURRENT THERAPY:  ?Neoadjuvant Gemcitabine/Abraxane, days 1 and 8 q21d, starting 02/16/22 ? ?ASSESSMENT & PLAN:  ?Miranda Miranda Francis is a 76 y.o. female with  ? ?1. Pancreatic Cancer, stage IB c(T2, N0, M0), borderline resectable, MMR normal   ?-presented to PCP with jaundice for one month. Also complained of epigastric pain, poor appetite, weight loss, and light-colored stool. Found to have elevated liver enzymes and referred to ED on 01/16/22. CMP in ED showed tbili at 39.3. CT AP showed: marked biliary dilatation; soft tissue mass at pancreatic head with marked narrowing of SMV; diverticular disease of colon.  No distant metastasis on CT scan. ?-baseline CA 19-9 on 01/16/22 was elevated at 9,561. ?-MRCP abdomen MRI on 01/17/22 showed: obstructing mass within pancreatic head with low-level enhancement, causing significant extrinsic narrowing at SMV and dilatation. ?-ERCP on 01/18/22 by Dr. Benson Norway, FNA of pancreas head mass showed malignant cells consistent with adenocarcinoma. MMR normal. ?-she had port placed yesterday, 02/15/22, and she is scheduled to start neoadjuvant chemo with gemcitabine and abraxane today, 02/16/22. Labs reviewed, tbili down to 4.2 today. Given persistent elevation, we will hold abraxane today and recheck next week for day 8. ?  ?2. Symptom Management: decreased appetite with weight loss, abdominal discomfort ?-she reports a ~40 lb weight loss over the last 4 months or so. She endorses trying to supplement with Ensure and Boost. She notes trying to obtain Glucerna due to her DM. ?-she met with dietician on 02/14/22. ?  ?3. Obstructive  jaundice ?-presented with hyperbilirubinemia at 39.3 ?-s/p stent placement on 01/18/22 ?-tbili improving, down to 4.2 today (02/16/22) ?  ?4. Anemia ?-hgb dropped to 6.4 by 01/25/22 while in the hospital. She received blood transfusion that day, and hgb recovered to 8.4 the same day. ?-hgb 8.6 today (02/16/22) ?  ?5. Genetic Testing ?-she reports colon cancer in a brother (age 24) and a sister (age 80). ?-she met with counselor and underwent testing on 02/07/22. Results are pending. ?  ?  ?PLAN: ?-proceed with gemcitabine alone today due to elevated tbili and reduce dose to $Remov'800mg'yuxQTZ$ /m2 ?-lab, flush, f/u, and gem in a week ?-add abraxane if tbili is close to normal  ?-lab, flush, f/u, and gem/abraxane in 3 and 4 weeks ? -she prefers Thursday late morning or later ? ? ?No problem-specific Assessment & Plan notes found for this encounter. ? ? ?SUMMARY OF ONCOLOGIC HISTORY: ?Oncology History Overview Note  ? Cancer Staging  ?Pancreatic cancer ([place]) ?Staging form: Exocrine Pancreas, AJCC 8th Edition ?- Clinical stage from 01/18/2022: Stage IB (cT2, cN0, cM0) - Signed by Truitt Merle, MD on 01/28/2022 ? ?  ?Pancreatic cancer [facility])  ?01/16/2022 Imaging  ? CLINICAL DATA:  Jaundice ?  ?EXAM: ?CT ABDOMEN AND PELVIS WITH CONTRAST ? ?IMPRESSION: ?1. Marked intra and extrahepatic biliary dilatation with marked ?dilatation of pancreatic duct and distended gallbladder. There is ?significant atrophy of the body and tail of pancreas. Ill-defined ?soft tissue mass at the pancreatic head neck region with marked ?narrowing of the SMV by ill-defined mass, constellation of findings ?concerning for neoplasm/pancreas carcinoma. ?2. Diverticular disease of the colon without acute wall thickening ?3. Mild endometrial thickening  up to 9 mm, recommend nonemergent pelvic ultrasound for further evaluation ?  ?01/17/2022 Imaging  ? EXAM: ?MRI ABDOMEN WITHOUT AND WITH CONTRAST (INCLUDING MRCP) ? ?IMPRESSION: ?1. Obstructing mass within the pancreatic head with an  intraluminal ?component demonstrating low level enhancement. 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 ?2. This mass causes significant extrinsic narrowing at the ?portal/superior mesenteric vein confluence as well as marked intra ?and extrahepatic biliary dilatation. ?3. No distant metastases identified. ?4. Endoscopy for endoscopic ultrasound, tissue sampling and biliary decompression recommended. ?  ?01/18/2022 Cancer Staging  ? Staging form: Exocrine Pancreas, AJCC 8th Edition ?- Clinical stage from 01/18/2022: Stage IB (cT2, cN0, cM0) - Signed by Truitt Merle, MD on 01/28/2022 ?Stage prefix: Initial diagnosis ?Total positive nodes: 0 ? ?  ?01/18/2022 Initial Biopsy  ? FINAL MICROSCOPIC DIAGNOSIS:  ?A. PANCREAS, HEAD, Miranda Francis NEEDLE ASPIRATION:  ?- Malignant cells present consistent with adenocarcinoma ?  ?01/24/2022 Imaging  ? EXAM: ?CT CHEST WITHOUT CONTRAST ? ?IMPRESSION: ?1. No evidence of metastatic disease in the chest. ?2. Atelectasis or consolidation in the right lower lung. ?3. Healing right rib fractures. ?4. Mass in the head of the pancreas. See previous report of CT ?abdomen and pelvis 01/16/2022. Interval placement of the biliary ?stent. ?  ?01/28/2022 Initial Diagnosis  ? Pancreatic cancer [facility]) ?  ?02/16/2022 -  Chemotherapy  ? Patient is on Treatment Plan : PANCREATIC Abraxane / Gemcitabine D1,8 q21d  ?   ? ? ? ?INTERVAL HISTORY:  ?Miranda Miranda Francis is here for a follow up of pancreatic cancer. She was last seen by me on 01/29/22. She presents to the clinic alone. She notes her husband is in the waiting room. ?She reports she is doing well overall, no new changes. ?  ?All other systems were reviewed with the patient and are negative. ? ?MEDICAL HISTORY:  ?Past Medical History:  ?Diagnosis Date  ? Blood transfusion   ? Diabetes mellitus   ? Family  history of breast cancer   ? Family history of colon cancer   ? Family history of prostate cancer   ? Heart murmur   ? Hypertension   ? ? ?SURGICAL HISTORY: ?Past Surgical History:  ?Procedure Laterality Date  ? BILIARY STENT PLACEMENT  01/18/2022  ? Procedure: BILIARY STENT PLACEMENT;  Surgeon: Carol Ada, MD;  Location: [place];  Service: Gastroenterology;;  ? DILATION AND CURETTAGE OF UTERUS    ? ERCP N/A 01/18/2022  ? Procedure: ENDOSCOPIC RETROGRADE CHOLANGIOPANCREATOGRAPHY (ERCP);  Surgeon: Carol Ada, MD;  Location: [place];  Service: Gastroenterology;  Laterality: N/A;  ? ESOPHAGOGASTRODUODENOSCOPY N/A 01/18/2022  ? Procedure: ESOPHAGOGASTRODUODENOSCOPY (EGD);  Surgeon: Carol Ada, MD;  Location: [place];  Service: Gastroenterology;  Laterality: N/A;  ? EUS  01/18/2022  ? Procedure: UPPER ENDOSCOPIC ULTRASOUND (EUS) LINEAR;  Surgeon: Carol Ada, MD;  Location: [place];  Service: Gastroenterology;;  ? Miranda Francis NEEDLE ASPIRATION  01/18/2022  ? Procedure: Miranda Francis NEEDLE ASPIRATION (FNA) LINEAR;  Surgeon: Carol Ada, MD;  Location: [place];  Service: Gastroenterology;;  ? IR IMAGING GUIDED PORT INSERTION  02/15/2022  ? NO PAST SURGERIES    ? SPHINCTEROTOMY  01/18/2022  ? Procedure: SPHINCTEROTOMY;  Surgeon: Carol Ada, MD;  Location: [place];  Service: Gastroenterology;;  ? ? ?I have reviewed the social history and family history with the patient and they are unchanged from previous note. ? ?ALLERGIES:  has  No Known Allergies. ? ?MEDICATIONS:  ?Current Outpatient Medications  ?Medication Sig Dispense Refill  ? acetaminophen (TYLENOL) 500 MG tablet Take 1,000 mg by mouth every 6 (six) hours as needed for moderate pain or headache.    ? amLODipine (NORVASC) 5 MG tablet Take 5 mg by mouth daily.    ? Cholecalciferol (QC VITAMIN D3) 50 MCG (2000 UT) TABS Take 2,000 Units by mouth daily.    ? cholestyramine light (PREVALITE) 4 g packet Take 1 packet (4 g total) by mouth 2 (two) times daily.  60 packet 0  ? glipiZIDE (GLUCOTROL) 5 MG tablet Take 2.5 mg by mouth 2 (two) times daily with a meal.    ? ibuprofen (ADVIL) 200 MG tablet Take 400 mg by mouth every 6 (six) hours as needed for moderate pain or headache.

## 2022-02-19 ENCOUNTER — Encounter: Payer: Self-pay | Admitting: Hematology

## 2022-02-19 ENCOUNTER — Ambulatory Visit: Payer: Self-pay | Admitting: Genetic Counselor

## 2022-02-19 ENCOUNTER — Telehealth: Payer: Self-pay

## 2022-02-19 DIAGNOSIS — Z1379 Encounter for other screening for genetic and chromosomal anomalies: Secondary | ICD-10-CM

## 2022-02-19 NOTE — Progress Notes (Signed)
HPI:  Miranda Francis was previously seen in the [place] clinic due to a personal and family history of cancer and concerns regarding a hereditary predisposition to cancer. Please refer to our prior cancer genetics clinic note for more information regarding our discussion, assessment and recommendations, at the time. [name] recent genetic test results were disclosed to her, as were recommendations warranted by these results. These results and recommendations are discussed in more detail below. ? ?CANCER HISTORY:  ?Oncology History Overview Note  ? Cancer Staging  ?Pancreatic cancer ([place]) ?Staging form: Exocrine Pancreas, AJCC 8th Edition ?- Clinical stage from 01/18/2022: Stage IB (cT2, cN0, cM0) - Signed by Truitt Merle, MD on 01/28/2022 ? ?  ?Pancreatic cancer [facility])  ?01/16/2022 Imaging  ? CLINICAL DATA:  Jaundice ?  ?EXAM: ?CT ABDOMEN AND PELVIS WITH CONTRAST ? ?IMPRESSION: ?1. Marked intra and extrahepatic biliary dilatation with marked ?dilatation of pancreatic duct and distended gallbladder. There is ?significant atrophy of the body and tail of pancreas. Ill-defined ?soft tissue mass at the pancreatic head neck region with marked ?narrowing of the SMV by ill-defined mass, constellation of findings ?concerning for neoplasm/pancreas carcinoma. ?2. Diverticular disease of the colon without acute wall thickening ?3. Mild endometrial thickening up to 9 mm, recommend nonemergent pelvic ultrasound for further evaluation ?  ?01/17/2022 Imaging  ? EXAM: ?MRI ABDOMEN WITHOUT AND WITH CONTRAST (INCLUDING MRCP) ? ?IMPRESSION: ?1. Obstructing mass within the pancreatic head with an intraluminal ?component demonstrating low level enhancement. 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 ?2. This mass causes significant extrinsic narrowing at  the ?portal/superior mesenteric vein confluence as well as marked intra ?and extrahepatic biliary dilatation. ?3. No distant metastases identified. ?4. Endoscopy for endoscopic ultrasound, tissue sampling and biliary decompression recommended. ?  ?01/18/2022 Cancer Staging  ? Staging form: Exocrine Pancreas, AJCC 8th Edition ?- Clinical stage from 01/18/2022: Stage IB (cT2, cN0, cM0) - Signed by Truitt Merle, MD on 01/28/2022 ?Stage prefix: Initial diagnosis ?Total positive nodes: 0 ? ?  ?01/18/2022 Initial Biopsy  ? FINAL MICROSCOPIC DIAGNOSIS:  ?A. PANCREAS, HEAD, FINE NEEDLE ASPIRATION:  ?- Malignant cells present consistent with adenocarcinoma ?  ?01/24/2022 Imaging  ? EXAM: ?CT CHEST WITHOUT CONTRAST ? ?IMPRESSION: ?1. No evidence of metastatic disease in the chest. ?2. Atelectasis or consolidation in the right lower lung. ?3. Healing right rib fractures. ?4. Mass in the head of the pancreas. See previous report of CT ?abdomen and pelvis 01/16/2022. Interval placement of the biliary ?stent. ?  ?01/28/2022 Initial Diagnosis  ? Pancreatic cancer [facility]) ?  ?02/16/2022 -  Chemotherapy  ? Patient is on Treatment Plan : PANCREATIC Abraxane / Gemcitabine D1,8 q21d  ?   ?02/16/2022 Genetic Testing  ? Negative genetic testing on the CancerNext-Expanded+RNAinsight panel.  The report date is February 16, 2022. ? ?The CancerNext-Expanded gene panel offered by [facility] and includes sequencing and rearrangement analysis for the following 77 genes: AIP, ALK, APC*, ATM*, AXIN2, BAP1, BARD1, BLM, BMPR1A, BRCA1*, BRCA2*, BRIP1*, CDC73, CDH1*, CDK4, CDKN1B, CDKN2A, CHEK2*, CTNNA1, DICER1, FANCC, FH, FLCN, GALNT12, KIF1B, LZTR1, MAX, MEN1, MET, MLH1*, MSH2*, MSH3, MSH6*, MUTYH*, NBN, NF1*, NF2, NTHL1, PALB2*, PHOX2B, PMS2*, POT1, PRKAR1A, PTCH1, PTEN*, RAD51C*, RAD51D*, RB1, RECQL, RET, SDHA, SDHAF2, SDHB, SDHC, SDHD, SMAD4, SMARCA4, SMARCB1, SMARCE1, STK11, SUFU, TMEM127, TP53*, TSC1, TSC2, VHL and XRCC2 (sequencing and deletion/duplication);  EGFR, EGLN1, HOXB13, KIT, MITF,  PDGFRA, POLD1, and POLE (sequencing only); EPCAM and GREM1 (deletion/duplication only). DNA and RNA analyses performed for * genes.  ?  ? ? ?FAMILY HISTORY:  ?We obtained a detailed, 4-generation family history.  Significant diagnoses are listed below: ?Family History  ?Problem Relation Age of Onset  ? Diabetes type II Mother   ? Stroke Father   ? Diabetes type II Sister   ? Colon cancer Sister 20  ? Colon cancer Brother 50  ? Breast cancer Maternal Aunt 90  ? Breast cancer Maternal Aunt 67  ? Prostate cancer Maternal Uncle   ? Heart attack Maternal Grandmother   ? ? ?The patient has a son and daughter who are cancer free.  She has five sisters and four brothers.  One sister and one brother had colon cancer in their 29's.  The patient's parents are deceased. ?  ?The patient's father died of a stroke.  The patient does not have a lot of information about the paternal side of the family. ?  ?The patient's mother died from complications of diabetes. She had multiple siblings, one brother had prostate cancer and two sisters had breast cancer in their 108's.  There is no other cancer known in the family. ?  ?[name] is unaware of previous family history of genetic testing for hereditary cancer risks. Patient's maternal ancestors are of African American descent, and paternal ancestors are of African American descent. There is no reported Ashkenazi Jewish ancestry. There is no known consanguinity ? ?GENETIC TEST RESULTS: Genetic testing reported out on February 15, 2022 through the CancerNext-Expanded+RNAinsight cancer panel found no pathogenic mutations. The CancerNext-Expanded gene panel offered by [facility] and includes sequencing and rearrangement analysis for the following 77 genes: AIP, ALK, APC*, ATM*, AXIN2, BAP1, BARD1, BLM, BMPR1A, BRCA1*, BRCA2*, BRIP1*, CDC73, CDH1*, CDK4, CDKN1B, CDKN2A, CHEK2*, CTNNA1, DICER1, FANCC, FH, FLCN, GALNT12, KIF1B, LZTR1, MAX, MEN1, MET, MLH1*,  MSH2*, MSH3, MSH6*, MUTYH*, NBN, NF1*, NF2, NTHL1, PALB2*, PHOX2B, PMS2*, POT1, PRKAR1A, PTCH1, PTEN*, RAD51C*, RAD51D*, RB1, RECQL, RET, SDHA, SDHAF2, SDHB, SDHC, SDHD, SMAD4, SMARCA4, SMARCB1, SMARCE1, STK11, SUFU, TMEM127, TP53*, TSC1, TSC2, VHL and XRCC2 (sequencing and deletion/duplication); EGFR, EGLN1, HOXB13, KIT, MITF, PDGFRA, POLD1, and POLE (sequencing only); EPCAM and GREM1 (deletion/duplication only). DNA and RNA analyses performed for * genes. The test report has been scanned into EPIC and is located under the Molecular Pathology section of the Results Review tab.  A portion of the result report is included below for reference.  ? ? ? ?We discussed with Ms. Malbrough that because current genetic testing is not perfect, it is possible there may be a gene mutation in one of these genes that current testing cannot detect, but that chance is small.  We also discussed, that there could be another gene that has not yet been discovered, or that we have not yet tested, that is responsible for the cancer diagnoses in the family. It is also possible there is a hereditary cause for the cancer in the family that Ms. Pais did not inherit and therefore was not identified in her testing.  Therefore, it is important to remain in touch with cancer genetics in the future so that we can continue to offer Ms. Sol the most up to date genetic testing.  ? ?ADDITIONAL GENETIC TESTING: We discussed with Ms. Denunzio that her genetic testing was fairly extensive.  If there are genes identified to increase cancer risk that can be analyzed in the future, we would be happy to discuss and  coordinate this testing at that time.   ? ?CANCER SCREENING RECOMMENDATIONS: Ms. Pollitt test result is considered negative (normal).  This means that we have not identified a hereditary cause for her personal and family history of cancer at this time. Most cancers happen by chance and this negative test suggests that her cancer  may fall into this category.   ? ?While reassuring, this does not definitively rule out a hereditary predisposition to cancer. It is still possible that there could be genetic mutations that are undetectable

## 2022-02-19 NOTE — Telephone Encounter (Signed)
Revealed negative genetic testing.  Discussed that we do not know why she has pancreatic cancer or why there is cancer in the family. It could be due to a different gene that we are not testing, or maybe our current technology may not be able to pick something up.  It will be important for her to keep in contact with genetics to keep up with whether additional testing may be needed.  

## 2022-02-19 NOTE — Telephone Encounter (Signed)
-----   Message from Rolene Course, RN sent at 02/16/2022 12:50 PM EDT ----- ?Regarding: Feng 1st Tx F/U call - gemzar ?Feng 1st Tx F/U call - gemzar.  Tolerated well. ? ?

## 2022-02-19 NOTE — Telephone Encounter (Signed)
LM for patient that this nurse was calling to see how they were doing after their treatment. Please call back to Dr.  Feng's nurse at [phone] if they have any questions or concerns regarding the treatment. 

## 2022-02-19 NOTE — Progress Notes (Signed)
Received call from patient after receiving my card per my request. ? ?Introduced myself as Arboriculturist and to offer available resources. ? ?Discussed one-time $1000 Radio broadcast assistant to assist with personal expenses while going through treatment. Provided verbally income guidelines. She will check with spouse and return my call at her earliest convenience. ? ?She has my card for any additional financial questions or concerns. ?

## 2022-02-21 ENCOUNTER — Telehealth: Payer: Self-pay | Admitting: Hematology

## 2022-02-21 NOTE — Telephone Encounter (Signed)
Scheduled follow-up appointments per 4/7 los. Patient is aware. 

## 2022-02-23 ENCOUNTER — Inpatient Hospital Stay: Payer: Medicare HMO | Admitting: Hematology

## 2022-02-23 ENCOUNTER — Other Ambulatory Visit: Payer: Self-pay

## 2022-02-23 ENCOUNTER — Inpatient Hospital Stay: Payer: Medicare HMO

## 2022-02-23 ENCOUNTER — Encounter: Payer: Self-pay | Admitting: Hematology

## 2022-02-23 VITALS — BP 144/94 | HR 79 | Temp 98.6°F | Resp 17 | Ht 63.0 in | Wt 153.4 lb

## 2022-02-23 DIAGNOSIS — Z95828 Presence of other vascular implants and grafts: Secondary | ICD-10-CM

## 2022-02-23 DIAGNOSIS — Z5111 Encounter for antineoplastic chemotherapy: Secondary | ICD-10-CM | POA: Diagnosis not present

## 2022-02-23 DIAGNOSIS — C250 Malignant neoplasm of head of pancreas: Secondary | ICD-10-CM

## 2022-02-23 LAB — CMP (CANCER CENTER ONLY)
ALT: 17 U/L (ref 0–44)
AST: 19 U/L (ref 15–41)
Albumin: 3.4 g/dL — ABNORMAL LOW (ref 3.5–5.0)
Alkaline Phosphatase: 281 U/L — ABNORMAL HIGH (ref 38–126)
Anion gap: 4 — ABNORMAL LOW (ref 5–15)
BUN: 11 mg/dL (ref 8–23)
CO2: 27 mmol/L (ref 22–32)
Calcium: 8.9 mg/dL (ref 8.9–10.3)
Chloride: 104 mmol/L (ref 98–111)
Creatinine: 0.48 mg/dL (ref 0.44–1.00)
GFR, Estimated: >60 mL/min (ref >60)
Glucose, Bld: 228 mg/dL — ABNORMAL HIGH (ref 70–99)
Potassium: 3.7 mmol/L (ref 3.5–5.1)
Sodium: 135 mmol/L (ref 135–145)
Total Bilirubin: 3 mg/dL — ABNORMAL HIGH (ref 0.3–1.2)
Total Protein: 6.8 g/dL (ref 6.5–8.1)

## 2022-02-23 LAB — CBC WITH DIFFERENTIAL (CANCER CENTER ONLY)
Abs Immature Granulocytes: 0.02 10*3/uL (ref 0.00–0.07)
Basophils Absolute: 0 10*3/uL (ref 0.0–0.1)
Basophils Relative: 0 %
Eosinophils Absolute: 0 10*3/uL (ref 0.0–0.5)
Eosinophils Relative: 1 %
HCT: 25.9 % — ABNORMAL LOW (ref 36.0–46.0)
Hemoglobin: 8.5 g/dL — ABNORMAL LOW (ref 12.0–15.0)
Immature Granulocytes: 0 %
Lymphocytes Relative: 61 %
Lymphs Abs: 3.2 10*3/uL (ref 0.7–4.0)
MCH: 32.2 pg (ref 26.0–34.0)
MCHC: 32.8 g/dL (ref 30.0–36.0)
MCV: 98.1 fL (ref 80.0–100.0)
Monocytes Absolute: 0.3 10*3/uL (ref 0.1–1.0)
Monocytes Relative: 5 %
Neutro Abs: 1.7 10*3/uL (ref 1.7–7.7)
Neutrophils Relative %: 33 %
Platelet Count: 120 10*3/uL — ABNORMAL LOW (ref 150–400)
RBC: 2.64 MIL/uL — ABNORMAL LOW (ref 3.87–5.11)
RDW: 15.4 % (ref 11.5–15.5)
WBC Count: 5.2 10*3/uL (ref 4.0–10.5)
nRBC: 0 % (ref 0.0–0.2)

## 2022-02-23 MED ORDER — SODIUM CHLORIDE 0.9% FLUSH
10.0000 mL | INTRAVENOUS | Status: DC | PRN
  Administered 2022-02-23: 10 mL

## 2022-02-23 MED ORDER — SODIUM CHLORIDE 0.9% FLUSH
10.0000 mL | INTRAVENOUS | Status: DC | PRN
  Administered 2022-02-23: 10 mL via INTRAVENOUS

## 2022-02-23 MED ORDER — SODIUM CHLORIDE 0.9 % IV SOLN: Freq: Once | INTRAVENOUS | Status: AC

## 2022-02-23 MED ORDER — HEPARIN SOD (PORK) LOCK FLUSH 100 UNIT/ML IV SOLN
500.0000 [IU] | Freq: Once | INTRAVENOUS | Status: AC | PRN
  Administered 2022-02-23: 500 [IU]

## 2022-02-23 MED ORDER — PROCHLORPERAZINE MALEATE 10 MG PO TABS
10.0000 mg | ORAL_TABLET | Freq: Once | ORAL | Status: AC
  Administered 2022-02-23: 10 mg via ORAL
  Filled 2022-02-23: qty 1

## 2022-02-23 MED ORDER — SODIUM CHLORIDE 0.9 % IV SOLN
1000.0000 mg/m2 | Freq: Once | INTRAVENOUS | Status: AC
  Administered 2022-02-23: 1786 mg via INTRAVENOUS
  Filled 2022-02-23: qty 46.97

## 2022-02-23 NOTE — Patient Instructions (Signed)
Miranda Francis  Discharge Instructions: ?Thank you for choosing [facility] to provide your oncology and hematology care.  ? ?If you have a lab appointment with the [facility], please go directly to the [facility] and check in at the registration area. ?  ?Wear comfortable clothing and clothing appropriate for easy access to any Portacath or PICC line.  ? ?We strive to give you quality time with your provider. You may need to reschedule your appointment if you arrive late (15 or more minutes).  Arriving late affects you and other patients whose appointments are after yours.  Also, if you miss three or more appointments without notifying the office, you may be dismissed from the clinic at the provider?s discretion.    ?  ?For prescription refill requests, have your pharmacy contact our office and allow 72 hours for refills to be completed.   ? ?Today you received the following chemotherapy and/or immunotherapy agents Gemzar    ?  ?To help prevent nausea and vomiting after your treatment, we encourage you to take your nausea medication as directed. ? ?BELOW ARE SYMPTOMS THAT SHOULD BE REPORTED IMMEDIATELY: ?*FEVER GREATER THAN 100.4 F (38 ?C) OR HIGHER ?*CHILLS OR SWEATING ?*NAUSEA AND VOMITING THAT IS NOT CONTROLLED WITH YOUR NAUSEA MEDICATION ?*UNUSUAL SHORTNESS OF BREATH ?*UNUSUAL BRUISING OR BLEEDING ?*URINARY PROBLEMS (pain or burning when urinating, or frequent urination) ?*BOWEL PROBLEMS (unusual diarrhea, constipation, pain near the anus) ?TENDERNESS IN MOUTH AND THROAT WITH OR WITHOUT PRESENCE OF ULCERS (sore throat, sores in mouth, or a toothache) ?UNUSUAL RASH, SWELLING OR PAIN  ?UNUSUAL VAGINAL DISCHARGE OR ITCHING  ? ?Items with * indicate a potential emergency and should be followed up as soon as possible or go to the Emergency Department if any problems should occur. ? ?Please show the CHEMOTHERAPY ALERT CARD or IMMUNOTHERAPY ALERT CARD at check-in to the  Emergency Department and triage nurse. ? ?Should you have questions after your visit or need to cancel or reschedule your appointment, please contact [facility]  Dept: [phone]  and follow the prompts.  Office hours are 8:00 a.m. to 4:30 p.m. Monday - Friday. Please note that voicemails left after 4:00 p.m. may not be returned until the following business day.  We are closed weekends and major holidays. You have access to a nurse at all times for urgent questions. Please call the main number to the clinic Dept: [phone] and follow the prompts. ? ? ?For any non-urgent questions, you may also contact your provider using MyChart. We now offer e-Visits for anyone 78 and older to request care online for non-urgent symptoms. For details visit [email or web address]. ?  ?Also download the MyChart app! Go to the app store, search "MyChart", open the app, select [facility], and log in with your MyChart username and password. ? ?Due to Covid, a mask is required upon entering the hospital/clinic. If you do not have a mask, one will be given to you upon arrival. For doctor visits, patients may have 1 support person aged 74 or older with them. For treatment visits, patients cannot have anyone with them due to current Covid guidelines and our immunocompromised population.  ? ?Gemcitabine injection ?What is this medication? ?GEMCITABINE (jem SYE ta been) is a chemotherapy drug. This medicine is used to treat many types of cancer like breast cancer, lung cancer, pancreatic cancer, and ovarian cancer. ?This medicine may be used for other purposes; ask your health care provider or  pharmacist if you have questions. ?COMMON BRAND NAME(S): Gemzar, Infugem ?What should I tell my care team before I take this medication? ?They need to know if you have any of these conditions: ?blood disorders ?infection ?kidney disease ?liver disease ?lung or breathing disease, like asthma ?recent or ongoing  radiation therapy ?an unusual or allergic reaction to gemcitabine, other chemotherapy, other medicines, foods, dyes, or preservatives ?pregnant or trying to get pregnant ?breast-feeding ?How should I use this medication? ?This drug is given as an infusion into a vein. It is administered in a hospital or clinic by a specially trained health care professional. ?Talk to your pediatrician regarding the use of this medicine in children. Special care may be needed. ?Overdosage: If you think you have taken too much of this medicine contact a poison control center or emergency room at once. ?NOTE: This medicine is only for you. Do not share this medicine with others. ?What if I miss a dose? ?It is important not to miss your dose. Call your doctor or health care professional if you are unable to keep an appointment. ?What may interact with this medication? ?medicines to increase blood counts like filgrastim, pegfilgrastim, sargramostim ?some other chemotherapy drugs like cisplatin ?vaccines ?Talk to your doctor or health care professional before taking any of these medicines: ?acetaminophen ?aspirin ?ibuprofen ?ketoprofen ?naproxen ?This list may not describe all possible interactions. Give your health care provider a list of all the medicines, herbs, non-prescription drugs, or dietary supplements you use. Also tell them if you smoke, drink alcohol, or use illegal drugs. Some items may interact with your medicine. ?What should I watch for while using this medication? ?Visit your doctor for checks on your progress. This drug may make you feel generally unwell. This is not uncommon, as chemotherapy can affect healthy cells as well as cancer cells. Report any side effects. Continue your course of treatment even though you feel ill unless your doctor tells you to stop. ?In some cases, you may be given additional medicines to help with side effects. Follow all directions for their use. ?Call your doctor or health care  professional for advice if you get a fever, chills or sore throat, or other symptoms of a cold or flu. Do not treat yourself. This drug decreases your body's ability to fight infections. Try to avoid being around people who are sick. ?This medicine may increase your risk to bruise or bleed. Call your doctor or health care professional if you notice any unusual bleeding. ?Be careful brushing and flossing your teeth or using a toothpick because you may get an infection or bleed more easily. If you have any dental work done, tell your dentist you are receiving this medicine. ?Avoid taking products that contain aspirin, acetaminophen, ibuprofen, naproxen, or ketoprofen unless instructed by your doctor. These medicines may hide a fever. ?Do not become pregnant while taking this medicine or for 6 months after stopping it. Women should inform their doctor if they wish to become pregnant or think they might be pregnant. Men should not father a child while taking this medicine and for 3 months after stopping it. There is a potential for serious side effects to an unborn child. Talk to your health care professional or pharmacist for more information. Do not breast-feed an infant while taking this medicine or for at least 1 week after stopping it. ?Men should inform their doctors if they wish to father a child. This medicine may lower sperm counts. Talk with your doctor or health care  professional if you are concerned about your fertility. ?What side effects may I notice from receiving this medication? ?Side effects that you should report to your doctor or health care professional as soon as possible: ?allergic reactions like skin rash, itching or hives, swelling of the face, lips, or tongue ?breathing problems ?pain, redness, or irritation at site where injected ?signs and symptoms of a dangerous change in heartbeat or heart rhythm like chest pain; dizziness; fast or irregular heartbeat; palpitations; feeling faint or  lightheaded, falls; breathing problems ?signs of decreased platelets or bleeding - bruising, pinpoint red spots on the skin, black, tarry stools, blood in the urine ?signs of decreased red blood cells - unusually weak or tire

## 2022-02-23 NOTE — Progress Notes (Signed)
?[facility]   ?Telephone:([phone]) [phone] [phone]   ?Clinic Follow up Note  ? ?Patient Care Team: ?[name], MD as PCP - General (Internal Medicine) ?Truitt Merle, MD as Consulting Physician (Oncology) ?Earl Gala, Deliah Goody, RN as Sales executive (Oncology) ? ?Date of Service:  02/23/2022 ? ?CHIEF COMPLAINT: f/u of pancreatic cancer ? ?CURRENT THERAPY:  ?Neoadjuvant Gemcitabine/Abraxane, days 1 and 8 q21d, starting 02/16/22 ? ?ASSESSMENT & PLAN:  ?Miranda Francis is a 76 y.o. female with  ? ?1. Pancreatic Cancer, stage IB c(T2, N0, M0), borderline resectable, MMR normal   ?-presented to PCP with jaundice for one month. Also complained of epigastric pain, poor appetite, weight loss, and light-colored stool. Found to have elevated liver enzymes and referred to ED on 01/16/22. CMP in ED showed tbili at 39.3. CT AP showed: marked biliary dilatation; soft tissue mass at pancreatic head with marked narrowing of SMV; diverticular disease of colon.  No distant metastasis on CT scan. ?-baseline CA 19-9 on 01/16/22 was elevated at 9,561. ?-MRCP abdomen MRI on 01/17/22 showed: obstructing mass within pancreatic head with low-level enhancement, causing significant extrinsic narrowing at SMV and dilatation. ?-ERCP on 01/18/22 by Dr. Benson Norway, FNA of pancreas head mass showed malignant cells consistent with adenocarcinoma. MMR normal. ?-she began neoadjuvant chemo on 02/16/22. She received gemcitabine alone due to persistently elevated tbili. Labs reviewed, tbili continues to slowly drop, down to 3 today. ?-She tolerated first dose gemcitabine well last week, lab reviewed, adequate for treatment, will proceed to cycle 1 day 8 of gemcitabine at full dose today. ?-If her total bilirubin came down to below 2, will start second cycle of chemotherapy with gemcitabine and Abraxane in 2 weeks ?  ?2. Symptom Management: decreased appetite with weight loss, abdominal discomfort ?-she reports a ~40 lb weight loss over the  last 4 months or so. She endorses trying to supplement with Ensure and Boost. She notes trying to obtain Glucerna due to her DM. ?-she met with dietician on 02/14/22. ?  ?3. Obstructive jaundice ?-presented with hyperbilirubinemia at 39.3 ?-s/p stent placement on 01/18/22 ?-tbili improving, down to 3 today (02/23/22) ?  ?4. Anemia ?-hgb dropped to 6.4 by 01/25/22 while in the hospital. She received blood transfusion that day, and hgb recovered to 8.4 the same day. ?-hgb stable at 8.5 today (02/23/22) ?  ?5. Genetic Testing ?-she reports colon cancer in a brother (age 64) and a sister (age 46). ?-she proceeded with testing on 02/07/22. Results were negative. ?  ?  ?PLAN: ?-proceed with gemcitabine at full dose today  ?-lab, flush, f/u, and gem/abraxane in 2 and 3 weeks, if tbil<2 ?            -she prefers Thursday late morning or later ? ? ?No problem-specific Assessment & Plan notes found for this encounter. ? ? ?SUMMARY OF ONCOLOGIC HISTORY: ?Oncology History Overview Note  ? Cancer Staging  ?Pancreatic cancer ([place]) ?Staging form: Exocrine Pancreas, AJCC 8th Edition ?- Clinical stage from 01/18/2022: Stage IB (cT2, cN0, cM0) - Signed by Truitt Merle, MD on 01/28/2022 ? ?  ?Pancreatic cancer [facility])  ?01/16/2022 Imaging  ? CLINICAL DATA:  Jaundice ?  ?EXAM: ?CT ABDOMEN AND PELVIS WITH CONTRAST ? ?IMPRESSION: ?1. Marked intra and extrahepatic biliary dilatation with marked ?dilatation of pancreatic duct and distended gallbladder. There is ?significant atrophy of the body and tail of pancreas. Ill-defined ?soft tissue mass at the pancreatic head neck region with marked ?narrowing of the SMV by ill-defined mass,  constellation of findings ?concerning for neoplasm/pancreas carcinoma. ?2. Diverticular disease of the colon without acute wall thickening ?3. Mild endometrial thickening up to 9 mm, recommend nonemergent pelvic ultrasound for further evaluation ?  ?01/17/2022 Imaging  ? EXAM: ?MRI ABDOMEN WITHOUT AND WITH CONTRAST (INCLUDING  MRCP) ? ?IMPRESSION: ?1. Obstructing mass within the pancreatic head with an intraluminal ?component demonstrating low level enhancement. 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 ?2. This mass causes significant extrinsic narrowing at the ?portal/superior mesenteric vein confluence as well as marked intra ?and extrahepatic biliary dilatation. ?3. No distant metastases identified. ?4. Endoscopy for endoscopic ultrasound, tissue sampling and biliary decompression recommended. ?  ?01/18/2022 Cancer Staging  ? Staging form: Exocrine Pancreas, AJCC 8th Edition ?- Clinical stage from 01/18/2022: Stage IB (cT2, cN0, cM0) - Signed by Truitt Merle, MD on 01/28/2022 ?Stage prefix: Initial diagnosis ?Total positive nodes: 0 ? ?  ?01/18/2022 Initial Biopsy  ? FINAL MICROSCOPIC DIAGNOSIS:  ?A. PANCREAS, HEAD, FINE NEEDLE ASPIRATION:  ?- Malignant cells present consistent with adenocarcinoma ?  ?01/24/2022 Imaging  ? EXAM: ?CT CHEST WITHOUT CONTRAST ? ?IMPRESSION: ?1. No evidence of metastatic disease in the chest. ?2. Atelectasis or consolidation in the right lower lung. ?3. Healing right rib fractures. ?4. Mass in the head of the pancreas. See previous report of CT ?abdomen and pelvis 01/16/2022. Interval placement of the biliary ?stent. ?  ?01/28/2022 Initial Diagnosis  ? Pancreatic cancer ([place]) ? ?  ?02/16/2022 -  Chemotherapy  ? Patient is on Treatment Plan : PANCREATIC Abraxane / Gemcitabine D1,8 q21d  ? ?  ?  ?02/16/2022 Genetic Testing  ? Negative genetic testing on the CancerNext-Expanded+RNAinsight panel.  The report date is February 16, 2022. ? ?The CancerNext-Expanded gene panel offered by [facility] and includes sequencing and rearrangement analysis for the following 77 genes: AIP, ALK, APC*, ATM*, AXIN2, BAP1, BARD1, BLM, BMPR1A, BRCA1*, BRCA2*, BRIP1*, CDC73, CDH1*,  CDK4, CDKN1B, CDKN2A, CHEK2*, CTNNA1, DICER1, FANCC, FH, FLCN, GALNT12, KIF1B, LZTR1, MAX, MEN1, MET, MLH1*, MSH2*, MSH3, MSH6*, MUTYH*, NBN, NF1*, NF2, NTHL1, PALB2*, PHOX2B, PMS2*, POT1, PRKAR1A, PTCH1, PTEN*, RAD51C*, RAD51D*, RB1, RECQL, RET, SDHA, SDHAF2, SDHB, SDHC, SDHD, SMAD4, SMARCA4, SMARCB1, SMARCE1, STK11, SUFU, TMEM127, TP53*, TSC1, TSC2, VHL and XRCC2 (sequencing and deletion/duplication); EGFR, EGLN1, HOXB13, KIT, MITF, PDGFRA, POLD1, and POLE (sequencing only); EPCAM and GREM1 (deletion/duplication only). DNA and RNA analyses performed for * genes.  ?  ? ? ? ?INTERVAL HISTORY:  ?France Lusty is here for a follow up of pancreatic cancer. She was last seen by me on 02/16/22. She presents to the clinic accompanied by her husband.  She tolerated first dose gemcitabine last week very well, no noticeable side effect.  She still has mild to moderate fatigue, but function adequately at home, and does go out for shopping etc.  No fever, chills, or other new complaints. ?  ?All other systems were reviewed with the patient and are negative. ? ?MEDICAL HISTORY:  ?Past Medical History:  ?Diagnosis Date  ? Blood transfusion   ? Diabetes mellitus   ? Family history of breast cancer   ? Family history of colon cancer   ? Family history of prostate cancer   ? Heart murmur   ? Hypertension   ? ? ?SURGICAL HISTORY: ?Past Surgical History:  ?Procedure Laterality Date  ? BILIARY STENT PLACEMENT  01/18/2022  ? Procedure: BILIARY STENT PLACEMENT;  Surgeon: Carol Ada, MD;  Location: [facility];  Service: Gastroenterology;;  ? DILATION AND CURETTAGE OF UTERUS    ? ERCP N/A 01/18/2022  ? Procedure: ENDOSCOPIC RETROGRADE CHOLANGIOPANCREATOGRAPHY (ERCP);  Surgeon: Carol Ada, MD;  Location: [facility];  Service: Gastroenterology;  Laterality: N/A;  ? ESOPHAGOGASTRODUODENOSCOPY N/A 01/18/2022  ? Procedure: ESOPHAGOGASTRODUODENOSCOPY (EGD);  Surgeon: Carol Ada, MD;  Location: [facility];  Service: Gastroenterology;   Laterality: N/A;  ? EUS  01/18/2022  ? Procedure: UPPER ENDOSCOPIC ULTRASOUND (EUS) LINEAR;  Surgeon: Carol Ada, MD;  Location: [facility];  Service: Gastroenterology;;  ? FINE NEEDLE ASPIRATION  3/9/2

## 2022-02-26 LAB — CANCER ANTIGEN 19-9: CA 19-9: 15453 U/mL — ABNORMAL HIGH (ref 0–35)

## 2022-03-07 NOTE — Progress Notes (Signed)
[facility] ?OFFICE PROGRESS NOTE ? ?Miranda Spanish, MD ?[place] ?[place] [address] ? ?DIAGNOSIS: f/u of pancreatic cancer ? ?Oncology History Overview Note  ? Cancer Staging  ?Pancreatic cancer ([facility]) ?Staging form: Exocrine Pancreas, AJCC 8th Edition ?- Clinical stage from 01/18/2022: Stage IB (cT2, cN0, cM0) - Signed by Truitt Merle, MD on 01/28/2022 ? ?  ?Pancreatic cancer [facility])  ?01/16/2022 Imaging  ? CLINICAL DATA:  Jaundice ?  ?EXAM: ?CT ABDOMEN AND PELVIS WITH CONTRAST ? ?IMPRESSION: ?1. Marked intra and extrahepatic biliary dilatation with marked ?dilatation of pancreatic duct and distended gallbladder. There is ?significant atrophy of the body and tail of pancreas. Ill-defined ?soft tissue mass at the pancreatic head neck region with marked ?narrowing of the SMV by ill-defined mass, constellation of findings ?concerning for neoplasm/pancreas carcinoma. ?2. Diverticular disease of the colon without acute wall thickening ?3. Mild endometrial thickening up to 9 mm, recommend nonemergent pelvic ultrasound for further evaluation ?  ?01/17/2022 Imaging  ? EXAM: ?MRI ABDOMEN WITHOUT AND WITH CONTRAST (INCLUDING MRCP) ? ?IMPRESSION: ?1. Obstructing mass within the pancreatic head with an intraluminal ?component demonstrating low level enhancement. 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 ?2. This mass causes significant extrinsic narrowing at the ?portal/superior mesenteric vein confluence as well as marked intra ?and extrahepatic biliary dilatation. ?3. No distant metastases identified. ?4. Endoscopy for endoscopic ultrasound, tissue sampling and biliary decompression recommended. ?  ?01/18/2022 Cancer Staging  ? Staging form: Exocrine Pancreas, AJCC 8th Edition ?- Clinical stage from 01/18/2022: Stage IB (cT2, cN0, cM0) - Signed by Truitt Merle, MD on 01/28/2022 ?Stage prefix: Initial diagnosis ?Total positive nodes: 0 ? ?  ?01/18/2022 Initial Biopsy  ? FINAL MICROSCOPIC DIAGNOSIS:  ?A. PANCREAS, HEAD, FINE NEEDLE ASPIRATION:  ?- Malignant cells present consistent with adenocarcinoma ?  ?01/24/2022 Imaging  ? EXAM: ?CT CHEST WITHOUT CONTRAST ? ?IMPRESSION: ?1. No evidence of metastatic disease in the chest. ?2. Atelectasis or consolidation in the right lower lung. ?3. Healing right rib fractures. ?4. Mass in the head of the pancreas. See previous report of CT ?abdomen and pelvis 01/16/2022. Interval placement of the biliary ?stent. ?  ?01/28/2022 Initial Diagnosis  ? Pancreatic cancer ([facility]) ? ?  ?02/16/2022 -  Chemotherapy  ? Patient is on Treatment Plan : PANCREATIC Abraxane / Gemcitabine D1,8 q21d  ? ?  ?  ?02/16/2022 Genetic Testing  ? Negative genetic testing on the CancerNext-Expanded+RNAinsight panel.  The report date is February 16, 2022. ? ?The CancerNext-Expanded gene panel offered by [facility] and includes sequencing and rearrangement analysis for the following 77 genes: AIP, ALK, APC*, ATM*, AXIN2, BAP1, BARD1, BLM, BMPR1A, BRCA1*, BRCA2*, BRIP1*, CDC73, CDH1*, CDK4, CDKN1B, CDKN2A, CHEK2*, CTNNA1, DICER1, FANCC, FH, FLCN, GALNT12, KIF1B, LZTR1, MAX, MEN1, MET, MLH1*, MSH2*, MSH3, MSH6*, MUTYH*, NBN, NF1*, NF2, NTHL1, PALB2*, PHOX2B, PMS2*, POT1, PRKAR1A, PTCH1, PTEN*, RAD51C*, RAD51D*, RB1, RECQL, RET, SDHA, SDHAF2, SDHB, SDHC, SDHD, SMAD4, SMARCA4, SMARCB1, SMARCE1, STK11, SUFU, TMEM127, TP53*, TSC1, TSC2, VHL and XRCC2 (sequencing and deletion/duplication); EGFR, EGLN1, HOXB13, KIT, MITF, PDGFRA, POLD1, and POLE (sequencing only); EPCAM and GREM1 (deletion/duplication only). DNA and RNA analyses performed for * genes.  ?  ? ? ?CURRENT THERAPY: Neoadjuvant Gemcitabine/Abraxane, days 1 and 8 q21d, starting 02/16/22. Abraxane held with C1 due to elevated bilirubin.  ? ?INTERVAL HISTORY: ?Miranda Francis 76 y.o. female returns to the  clinic today for a  follow-up visit accompanied by her husband.  The patient was recently diagnosed with pancreatic cancer.  The patient is currently undergoing neoadjuvant chemotherapy.  She is status post her first cycle and tolerated it fairly well.  Abraxane was held from cycle #1 due to persistently elevated but significantly improved/downtrending bilirubin.  When the patient presented initially with abdominal pain, weight loss, and jaundice, her T bili was significantly elevated at 39.3.  The patient met with Dr. Zenia Resides from [facility] surgery on 02/26/2022 since last being seen.  Dr. Zenia Resides recommended that she completes 6-8 cycles of neoadjuvant chemotherapy if able to tolerate.  Then she recommends repeat imaging and to see her back at that time to make decision about possible surgery via Whipple procedure. ? ?Since undergoing her first cycle of chemotherapy, her main concern is related to have decreased appetite and weight loss.  She is followed closely by member the nutritionist team.  The patient's weight is 150 lbs today, 3 lbs less then last visit. She is drinking supplemental drinks. She will start drinking Glucerna due to her diabetes. She would be interested in an appetite stimulant. She also mentions she has trouble sleeping. She notes that food tastes like cardboard. She has an aversion to the smell of certain foods. She denies significant nausea/queasiness, although her husband believes she has had nausea. She had one episode of vomiting in the interval after eating pizza.  ? ?  She continues to have stable fatigue.  She does have report some chills/cold intolerance but takes her temperature at home and has not had a fever. She denies night sweats. Denies any chest pain, shortness of breath, cough, or hemoptysis.  Denies any peripheral neuropathy.  Denies any vomiting, diarrhea, or constipation. She has a BM once every 3 days. She is drinking Mirilax and a stool softener per PCP 2x per day. She has occasional LUQ  abdominal pain after she eats a lot. She takes one $RemoveB'5mg'ANeIyJjE$  tab of oxycodone for abdominal pain. She takes on average 1 pill of oxycodone per day. She believes her pain is controlled.  She denies any back pain.  Denies any increased jaundice or itching. She states her jaundice has cleared up.  She is here today for evaluation and repeat blood work before starting cycle #2. ? ? ?MEDICAL HISTORY: ?Past Medical History:  ?Diagnosis Date  ? Blood transfusion   ? Diabetes mellitus   ? Family history of breast cancer   ? Family history of colon cancer   ? Family history of prostate cancer   ? Heart murmur   ? Hypertension   ? ? ?ALLERGIES:  has No Known Allergies. ? ?MEDICATIONS:  ?Current Outpatient Medications  ?Medication Sig Dispense Refill  ? mirtazapine (REMERON) 7.5 MG tablet Take 1 tablet (7.5 mg total) by mouth at bedtime. 30 tablet 2  ? acetaminophen (TYLENOL) 500 MG tablet Take 1,000 mg by mouth every 6 (six) hours as needed for moderate pain or headache.    ? amLODipine (NORVASC) 5 MG tablet Take 5 mg by mouth daily.    ? Cholecalciferol (QC VITAMIN D3) 50 MCG (2000 UT) TABS Take 2,000 Units by mouth daily.    ? cholestyramine light (PREVALITE) 4 g packet Take 1 packet (4 g total) by mouth 2 (two) times daily. 60 packet 0  ? glipiZIDE (GLUCOTROL) 5 MG tablet Take 2.5 mg by mouth 2 (two) times daily with a meal.    ? KLOR-CON M20 20 MEQ tablet  Take 20 mEq by mouth 2 (two) times daily.    ? lidocaine-prilocaine (EMLA) cream Apply to affected area once 30 g 3  ? metFORMIN (GLUCOPHAGE) 500 MG tablet Take by mouth daily with breakfast.    ? ondansetron (ZOFRAN) 8 MG tablet Take 1 tablet (8 mg total) by mouth 2 (two) times daily as needed (Nausea or vomiting). 30 tablet 1  ? oxyCODONE (OXY IR/ROXICODONE) 5 MG immediate release tablet Take 1 tablet (5 mg total) by mouth every 3 (three) hours as needed for severe pain. 20 tablet 0  ? polyethylene glycol (MIRALAX / GLYCOLAX) 17 g packet Take 17 g by mouth daily. 14 each 0   ? prochlorperazine (COMPAZINE) 10 MG tablet Take 1 tablet (10 mg total) by mouth every 6 (six) hours as needed (Nausea or vomiting). 30 tablet 1  ? ?No current facility-administered medications for

## 2022-03-08 ENCOUNTER — Other Ambulatory Visit: Payer: Self-pay

## 2022-03-08 ENCOUNTER — Inpatient Hospital Stay: Payer: Medicare HMO

## 2022-03-08 ENCOUNTER — Inpatient Hospital Stay: Payer: Medicare HMO | Admitting: Physician Assistant

## 2022-03-08 VITALS — BP 140/89 | HR 64 | Temp 97.5°F | Resp 20 | Wt 150.7 lb

## 2022-03-08 DIAGNOSIS — C250 Malignant neoplasm of head of pancreas: Secondary | ICD-10-CM | POA: Diagnosis not present

## 2022-03-08 DIAGNOSIS — R630 Anorexia: Secondary | ICD-10-CM

## 2022-03-08 DIAGNOSIS — Z5111 Encounter for antineoplastic chemotherapy: Secondary | ICD-10-CM | POA: Diagnosis not present

## 2022-03-08 LAB — CBC WITH DIFFERENTIAL (CANCER CENTER ONLY)
Abs Immature Granulocytes: 0.01 10*3/uL (ref 0.00–0.07)
Basophils Absolute: 0 10*3/uL (ref 0.0–0.1)
Basophils Relative: 1 %
Eosinophils Absolute: 0.1 10*3/uL (ref 0.0–0.5)
Eosinophils Relative: 1 %
HCT: 29.3 % — ABNORMAL LOW (ref 36.0–46.0)
Hemoglobin: 9.6 g/dL — ABNORMAL LOW (ref 12.0–15.0)
Immature Granulocytes: 0 %
Lymphocytes Relative: 46 %
Lymphs Abs: 2.9 10*3/uL (ref 0.7–4.0)
MCH: 31.9 pg (ref 26.0–34.0)
MCHC: 32.8 g/dL (ref 30.0–36.0)
MCV: 97.3 fL (ref 80.0–100.0)
Monocytes Absolute: 0.4 10*3/uL (ref 0.1–1.0)
Monocytes Relative: 6 %
Neutro Abs: 2.9 10*3/uL (ref 1.7–7.7)
Neutrophils Relative %: 46 %
Platelet Count: 302 10*3/uL (ref 150–400)
RBC: 3.01 MIL/uL — ABNORMAL LOW (ref 3.87–5.11)
RDW: 16.4 % — ABNORMAL HIGH (ref 11.5–15.5)
WBC Count: 6.3 10*3/uL (ref 4.0–10.5)
nRBC: 0 % (ref 0.0–0.2)

## 2022-03-08 LAB — CMP (CANCER CENTER ONLY)
ALT: 12 U/L (ref 0–44)
AST: 14 U/L — ABNORMAL LOW (ref 15–41)
Albumin: 3.6 g/dL (ref 3.5–5.0)
Alkaline Phosphatase: 185 U/L — ABNORMAL HIGH (ref 38–126)
Anion gap: 7 (ref 5–15)
BUN: 11 mg/dL (ref 8–23)
CO2: 25 mmol/L (ref 22–32)
Calcium: 9.3 mg/dL (ref 8.9–10.3)
Chloride: 105 mmol/L (ref 98–111)
Creatinine: 0.5 mg/dL (ref 0.44–1.00)
GFR, Estimated: >60 mL/min (ref >60)
Glucose, Bld: 188 mg/dL — ABNORMAL HIGH (ref 70–99)
Potassium: 4 mmol/L (ref 3.5–5.1)
Sodium: 137 mmol/L (ref 135–145)
Total Bilirubin: 1.8 mg/dL — ABNORMAL HIGH (ref 0.3–1.2)
Total Protein: 6.7 g/dL (ref 6.5–8.1)

## 2022-03-08 MED ORDER — PACLITAXEL PROTEIN-BOUND CHEMO INJECTION 100 MG
125.0000 mg/m2 | Freq: Once | INTRAVENOUS | Status: AC
  Administered 2022-03-08: 225 mg via INTRAVENOUS
  Filled 2022-03-08: qty 45

## 2022-03-08 MED ORDER — PROCHLORPERAZINE MALEATE 10 MG PO TABS
10.0000 mg | ORAL_TABLET | Freq: Once | ORAL | Status: AC
  Administered 2022-03-08: 10 mg via ORAL
  Filled 2022-03-08: qty 1

## 2022-03-08 MED ORDER — HEPARIN SOD (PORK) LOCK FLUSH 100 UNIT/ML IV SOLN
500.0000 [IU] | Freq: Once | INTRAVENOUS | Status: AC | PRN
  Administered 2022-03-08: 500 [IU]

## 2022-03-08 MED ORDER — SODIUM CHLORIDE 0.9% FLUSH
10.0000 mL | INTRAVENOUS | Status: DC | PRN
  Administered 2022-03-08: 10 mL

## 2022-03-08 MED ORDER — MIRTAZAPINE 7.5 MG PO TABS: 7.5000 mg | ORAL_TABLET | Freq: Every day | ORAL | 2 refills | Status: DC

## 2022-03-08 MED ORDER — SODIUM CHLORIDE 0.9 % IV SOLN: Freq: Once | INTRAVENOUS | Status: AC

## 2022-03-08 MED ORDER — SODIUM CHLORIDE 0.9 % IV SOLN
1000.0000 mg/m2 | Freq: Once | INTRAVENOUS | Status: AC
  Administered 2022-03-08: 1786 mg via INTRAVENOUS
  Filled 2022-03-08: qty 46.97

## 2022-03-08 NOTE — Patient Instructions (Signed)
[facility]  Discharge Instructions: ?Thank you for choosing [facility] to provide your oncology and hematology care.  ? ?If you have a lab appointment with the [facility], please go directly to the [facility] and check in at the registration area. ?  ?Wear comfortable clothing and clothing appropriate for easy access to any Portacath or PICC line.  ? ?We strive to give you quality time with your provider. You may need to reschedule your appointment if you arrive late (15 or more minutes).  Arriving late affects you and other patients whose appointments are after yours.  Also, if you miss three or more appointments without notifying the office, you may be dismissed from the clinic at the provider?s discretion.    ?  ?For prescription refill requests, have your pharmacy contact our office and allow 72 hours for refills to be completed.   ? ?Today you received the following chemotherapy and/or immunotherapy agents: Abraxane & Gemzar ?  ?To help prevent nausea and vomiting after your treatment, we encourage you to take your nausea medication as directed. ? ?BELOW ARE SYMPTOMS THAT SHOULD BE REPORTED IMMEDIATELY: ?*FEVER GREATER THAN 100.4 F (38 ?C) OR HIGHER ?*CHILLS OR SWEATING ?*NAUSEA AND VOMITING THAT IS NOT CONTROLLED WITH YOUR NAUSEA MEDICATION ?*UNUSUAL SHORTNESS OF BREATH ?*UNUSUAL BRUISING OR BLEEDING ?*URINARY PROBLEMS (pain or burning when urinating, or frequent urination) ?*BOWEL PROBLEMS (unusual diarrhea, constipation, pain near the anus) ?TENDERNESS IN MOUTH AND THROAT WITH OR WITHOUT PRESENCE OF ULCERS (sore throat, sores in mouth, or a toothache) ?UNUSUAL RASH, SWELLING OR PAIN  ?UNUSUAL VAGINAL DISCHARGE OR ITCHING  ? ?Items with * indicate a potential emergency and should be followed up as soon as possible or go to the Emergency Department if any problems should occur. ? ?Please show the CHEMOTHERAPY ALERT CARD or IMMUNOTHERAPY ALERT CARD at  check-in to the Emergency Department and triage nurse. ? ?Should you have questions after your visit or need to cancel or reschedule your appointment, please contact [facility]  Dept: [phone]  and follow the prompts.  Office hours are 8:00 a.m. to 4:30 p.m. Monday - Friday. Please note that voicemails left after 4:00 p.m. may not be returned until the following business day.  We are closed weekends and major holidays. You have access to a nurse at all times for urgent questions. Please call the main number to the clinic Dept: [phone] and follow the prompts. ? ? ?For any non-urgent questions, you may also contact your provider using MyChart. We now offer e-Visits for anyone 38 and older to request care online for non-urgent symptoms. For details visit [email or web address]. ?  ?Also download the MyChart app! Go to the app store, search "MyChart", open the app, select [facility], and log in with your MyChart username and password. ? ?Due to Covid, a mask is required upon entering the hospital/clinic. If you do not have a mask, one will be given to you upon arrival. For doctor visits, patients may have 1 support person aged 69 or older with them. For treatment visits, patients cannot have anyone with them due to current Covid guidelines and our immunocompromised population.  ? ?

## 2022-03-09 ENCOUNTER — Telehealth: Payer: Self-pay | Admitting: *Deleted

## 2022-03-09 NOTE — Telephone Encounter (Signed)
Spoke with pt. to follow up for first time Abraxane treatment. Pt. states she is feeling "fine". She states she had one episode of diarrhea, but it went away quickly. Instructed pt. to call with any issues or concerns. ?

## 2022-03-14 DIAGNOSIS — Z95828 Presence of other vascular implants and grafts: Secondary | ICD-10-CM | POA: Insufficient documentation

## 2022-03-15 ENCOUNTER — Other Ambulatory Visit: Payer: Self-pay | Admitting: Hematology

## 2022-03-15 ENCOUNTER — Inpatient Hospital Stay: Payer: Medicare HMO | Attending: Physician Assistant

## 2022-03-15 ENCOUNTER — Inpatient Hospital Stay: Payer: Medicare HMO

## 2022-03-15 VITALS — BP 142/97 | HR 86 | Temp 98.8°F | Resp 18 | Wt 149.5 lb

## 2022-03-15 DIAGNOSIS — D649 Anemia, unspecified: Secondary | ICD-10-CM | POA: Diagnosis not present

## 2022-03-15 DIAGNOSIS — C250 Malignant neoplasm of head of pancreas: Secondary | ICD-10-CM

## 2022-03-15 DIAGNOSIS — K831 Obstruction of bile duct: Secondary | ICD-10-CM | POA: Diagnosis not present

## 2022-03-15 DIAGNOSIS — Z79899 Other long term (current) drug therapy: Secondary | ICD-10-CM | POA: Diagnosis not present

## 2022-03-15 DIAGNOSIS — Z95828 Presence of other vascular implants and grafts: Secondary | ICD-10-CM

## 2022-03-15 DIAGNOSIS — Z5111 Encounter for antineoplastic chemotherapy: Secondary | ICD-10-CM | POA: Diagnosis not present

## 2022-03-15 LAB — CBC WITH DIFFERENTIAL (CANCER CENTER ONLY)
Abs Immature Granulocytes: 0.02 10*3/uL (ref 0.00–0.07)
Basophils Absolute: 0 10*3/uL (ref 0.0–0.1)
Basophils Relative: 1 %
Eosinophils Absolute: 0 10*3/uL (ref 0.0–0.5)
Eosinophils Relative: 0 %
HCT: 29.4 % — ABNORMAL LOW (ref 36.0–46.0)
Hemoglobin: 9.8 g/dL — ABNORMAL LOW (ref 12.0–15.0)
Immature Granulocytes: 0 %
Lymphocytes Relative: 70 %
Lymphs Abs: 3.4 10*3/uL (ref 0.7–4.0)
MCH: 32 pg (ref 26.0–34.0)
MCHC: 33.3 g/dL (ref 30.0–36.0)
MCV: 96.1 fL (ref 80.0–100.0)
Monocytes Absolute: 0.2 10*3/uL (ref 0.1–1.0)
Monocytes Relative: 4 %
Neutro Abs: 1.2 10*3/uL — ABNORMAL LOW (ref 1.7–7.7)
Neutrophils Relative %: 25 %
Platelet Count: 325 10*3/uL (ref 150–400)
RBC: 3.06 MIL/uL — ABNORMAL LOW (ref 3.87–5.11)
RDW: 15.5 % (ref 11.5–15.5)
Smear Review: NORMAL
WBC Count: 4.8 10*3/uL (ref 4.0–10.5)
nRBC: 0 % (ref 0.0–0.2)

## 2022-03-15 LAB — COMPREHENSIVE METABOLIC PANEL
ALT: 12 U/L (ref 0–44)
AST: 15 U/L (ref 15–41)
Albumin: 3.7 g/dL (ref 3.5–5.0)
Alkaline Phosphatase: 167 U/L — ABNORMAL HIGH (ref 38–126)
Anion gap: 6 (ref 5–15)
BUN: 10 mg/dL (ref 8–23)
CO2: 26 mmol/L (ref 22–32)
Calcium: 9.3 mg/dL (ref 8.9–10.3)
Chloride: 106 mmol/L (ref 98–111)
Creatinine, Ser: 0.42 mg/dL — ABNORMAL LOW (ref 0.44–1.00)
GFR, Estimated: >60 mL/min (ref >60)
Glucose, Bld: 129 mg/dL — ABNORMAL HIGH (ref 70–99)
Potassium: 3.8 mmol/L (ref 3.5–5.1)
Sodium: 138 mmol/L (ref 135–145)
Total Bilirubin: 1.5 mg/dL — ABNORMAL HIGH (ref 0.3–1.2)
Total Protein: 7 g/dL (ref 6.5–8.1)

## 2022-03-15 MED ORDER — HEPARIN SOD (PORK) LOCK FLUSH 100 UNIT/ML IV SOLN
500.0000 [IU] | Freq: Once | INTRAVENOUS | Status: AC | PRN
  Administered 2022-03-15: 500 [IU]

## 2022-03-15 MED ORDER — ACETAMINOPHEN 325 MG PO TABS
650.0000 mg | ORAL_TABLET | Freq: Once | ORAL | Status: AC
  Administered 2022-03-15: 650 mg via ORAL
  Filled 2022-03-15: qty 2

## 2022-03-15 MED ORDER — ACETAMINOPHEN 325 MG PO TABS: 650.0000 mg | ORAL_TABLET | Freq: Once | ORAL | Status: DC

## 2022-03-15 MED ORDER — SODIUM CHLORIDE 0.9 % IV SOLN: Freq: Once | INTRAVENOUS | Status: AC

## 2022-03-15 MED ORDER — PROCHLORPERAZINE MALEATE 10 MG PO TABS
10.0000 mg | ORAL_TABLET | Freq: Once | ORAL | Status: AC
  Administered 2022-03-15: 10 mg via ORAL
  Filled 2022-03-15: qty 1

## 2022-03-15 MED ORDER — SODIUM CHLORIDE 0.9% FLUSH
10.0000 mL | INTRAVENOUS | Status: DC | PRN
  Administered 2022-03-15: 10 mL

## 2022-03-15 MED ORDER — SODIUM CHLORIDE 0.9 % IV SOLN
1000.0000 mg/m2 | Freq: Once | INTRAVENOUS | Status: AC
  Administered 2022-03-15: 1786 mg via INTRAVENOUS
  Filled 2022-03-15: qty 46.97

## 2022-03-15 MED ORDER — SODIUM CHLORIDE 0.9% FLUSH
10.0000 mL | INTRAVENOUS | Status: DC | PRN
  Administered 2022-03-15: 10 mL via INTRAVENOUS

## 2022-03-15 MED ORDER — PACLITAXEL PROTEIN-BOUND CHEMO INJECTION 100 MG
100.0000 mg/m2 | Freq: Once | INTRAVENOUS | Status: AC
  Administered 2022-03-15: 175 mg via INTRAVENOUS
  Filled 2022-03-15: qty 35

## 2022-03-15 NOTE — Patient Instructions (Signed)
[facility]  Discharge Instructions: ?Thank you for choosing [facility] to provide your oncology and hematology care.  ? ?If you have a lab appointment with the [facility], please go directly to the [facility] and check in at the registration area. ?  ?Wear comfortable clothing and clothing appropriate for easy access to any Portacath or PICC line.  ? ?We strive to give you quality time with your provider. You may need to reschedule your appointment if you arrive late (15 or more minutes).  Arriving late affects you and other patients whose appointments are after yours.  Also, if you miss three or more appointments without notifying the office, you may be dismissed from the clinic at the provider?s discretion.    ?  ?For prescription refill requests, have your pharmacy contact our office and allow 72 hours for refills to be completed.   ? ?Today you received the following chemotherapy and/or immunotherapy agents: Gemzar and Abraxane    ?  ?To help prevent nausea and vomiting after your treatment, we encourage you to take your nausea medication as directed. ? ?BELOW ARE SYMPTOMS THAT SHOULD BE REPORTED IMMEDIATELY: ?*FEVER GREATER THAN 100.4 F (38 ?C) OR HIGHER ?*CHILLS OR SWEATING ?*NAUSEA AND VOMITING THAT IS NOT CONTROLLED WITH YOUR NAUSEA MEDICATION ?*UNUSUAL SHORTNESS OF BREATH ?*UNUSUAL BRUISING OR BLEEDING ?*URINARY PROBLEMS (pain or burning when urinating, or frequent urination) ?*BOWEL PROBLEMS (unusual diarrhea, constipation, pain near the anus) ?TENDERNESS IN MOUTH AND THROAT WITH OR WITHOUT PRESENCE OF ULCERS (sore throat, sores in mouth, or a toothache) ?UNUSUAL RASH, SWELLING OR PAIN  ?UNUSUAL VAGINAL DISCHARGE OR ITCHING  ? ?Items with * indicate a potential emergency and should be followed up as soon as possible or go to the Emergency Department if any problems should occur. ? ?Please show the CHEMOTHERAPY ALERT CARD or IMMUNOTHERAPY ALERT CARD at  check-in to the Emergency Department and triage nurse. ? ?Should you have questions after your visit or need to cancel or reschedule your appointment, please contact [facility]  Dept: [phone]  and follow the prompts.  Office hours are 8:00 a.m. to 4:30 p.m. Monday - Friday. Please note that voicemails left after 4:00 p.m. may not be returned until the following business day.  We are closed weekends and major holidays. You have access to a nurse at all times for urgent questions. Please call the main number to the clinic Dept: [phone] and follow the prompts. ? ? ?For any non-urgent questions, you may also contact your provider using MyChart. We now offer e-Visits for anyone 56 and older to request care online for non-urgent symptoms. For details visit [email or web address]. ?  ?Also download the MyChart app! Go to the app store, search "MyChart", open the app, select [facility], and log in with your MyChart username and password. ? ?Due to Covid, a mask is required upon entering the hospital/clinic. If you do not have a mask, one will be given to you upon arrival. For doctor visits, patients may have 1 support person aged 57 or older with them. For treatment visits, patients cannot have anyone with them due to current Covid guidelines and our immunocompromised population.  ? ?

## 2022-03-15 NOTE — Progress Notes (Signed)
Ok to treat with ANC 1.2 K/uL per Dr Burr Medico.  ?Pt reporting sharp pain on R side of head while in infusion and states she has been unable to eat or drink much, MD aware and to chair side. Ok to continue with treatment, MD lowered abraxane dose and tylenol ordered.  ?

## 2022-03-17 LAB — CANCER ANTIGEN 19-9: CA 19-9: 12030 U/mL — ABNORMAL HIGH (ref 0–35)

## 2022-03-29 ENCOUNTER — Other Ambulatory Visit: Payer: Self-pay

## 2022-03-29 ENCOUNTER — Inpatient Hospital Stay: Payer: Medicare HMO

## 2022-03-29 ENCOUNTER — Encounter: Payer: Self-pay | Admitting: Hematology

## 2022-03-29 ENCOUNTER — Inpatient Hospital Stay: Payer: Medicare HMO | Admitting: Hematology

## 2022-03-29 VITALS — BP 149/82 | HR 71 | Temp 98.7°F | Resp 18 | Ht 63.0 in | Wt 148.9 lb

## 2022-03-29 DIAGNOSIS — Z5111 Encounter for antineoplastic chemotherapy: Secondary | ICD-10-CM | POA: Diagnosis not present

## 2022-03-29 DIAGNOSIS — C250 Malignant neoplasm of head of pancreas: Secondary | ICD-10-CM

## 2022-03-29 DIAGNOSIS — Z95828 Presence of other vascular implants and grafts: Secondary | ICD-10-CM

## 2022-03-29 LAB — CBC WITH DIFFERENTIAL (CANCER CENTER ONLY)
Abs Immature Granulocytes: 0.03 10*3/uL (ref 0.00–0.07)
Basophils Absolute: 0 10*3/uL (ref 0.0–0.1)
Basophils Relative: 0 %
Eosinophils Absolute: 0.1 10*3/uL (ref 0.0–0.5)
Eosinophils Relative: 1 %
HCT: 29.4 % — ABNORMAL LOW (ref 36.0–46.0)
Hemoglobin: 9.9 g/dL — ABNORMAL LOW (ref 12.0–15.0)
Immature Granulocytes: 0 %
Lymphocytes Relative: 18 %
Lymphs Abs: 1.7 10*3/uL (ref 0.7–4.0)
MCH: 32.2 pg (ref 26.0–34.0)
MCHC: 33.7 g/dL (ref 30.0–36.0)
MCV: 95.8 fL (ref 80.0–100.0)
Monocytes Absolute: 0.4 10*3/uL (ref 0.1–1.0)
Monocytes Relative: 4 %
Neutro Abs: 7.2 10*3/uL (ref 1.7–7.7)
Neutrophils Relative %: 77 %
Platelet Count: 247 10*3/uL (ref 150–400)
RBC: 3.07 MIL/uL — ABNORMAL LOW (ref 3.87–5.11)
RDW: 16 % — ABNORMAL HIGH (ref 11.5–15.5)
WBC Count: 9.5 10*3/uL (ref 4.0–10.5)
nRBC: 0 % (ref 0.0–0.2)

## 2022-03-29 LAB — CMP (CANCER CENTER ONLY)
ALT: 12 U/L (ref 0–44)
AST: 16 U/L (ref 15–41)
Albumin: 3.7 g/dL (ref 3.5–5.0)
Alkaline Phosphatase: 157 U/L — ABNORMAL HIGH (ref 38–126)
Anion gap: 9 (ref 5–15)
BUN: 11 mg/dL (ref 8–23)
CO2: 24 mmol/L (ref 22–32)
Calcium: 9.2 mg/dL (ref 8.9–10.3)
Chloride: 105 mmol/L (ref 98–111)
Creatinine: 0.5 mg/dL (ref 0.44–1.00)
GFR, Estimated: >60 mL/min (ref >60)
Glucose, Bld: 177 mg/dL — ABNORMAL HIGH (ref 70–99)
Potassium: 3.5 mmol/L (ref 3.5–5.1)
Sodium: 138 mmol/L (ref 135–145)
Total Bilirubin: 1.1 mg/dL (ref 0.3–1.2)
Total Protein: 7.2 g/dL (ref 6.5–8.1)

## 2022-03-29 MED ORDER — PACLITAXEL PROTEIN-BOUND CHEMO INJECTION 100 MG
100.0000 mg/m2 | Freq: Once | INTRAVENOUS | Status: AC
  Administered 2022-03-29: 175 mg via INTRAVENOUS
  Filled 2022-03-29: qty 35

## 2022-03-29 MED ORDER — SODIUM CHLORIDE 0.9% FLUSH
10.0000 mL | INTRAVENOUS | Status: DC | PRN
  Administered 2022-03-29: 10 mL

## 2022-03-29 MED ORDER — PROCHLORPERAZINE MALEATE 10 MG PO TABS
10.0000 mg | ORAL_TABLET | Freq: Once | ORAL | Status: AC
  Administered 2022-03-29: 10 mg via ORAL
  Filled 2022-03-29: qty 1

## 2022-03-29 MED ORDER — HEPARIN SOD (PORK) LOCK FLUSH 100 UNIT/ML IV SOLN
500.0000 [IU] | Freq: Once | INTRAVENOUS | Status: AC | PRN
  Administered 2022-03-29: 500 [IU]

## 2022-03-29 MED ORDER — SODIUM CHLORIDE 0.9 % IV SOLN: Freq: Once | INTRAVENOUS | Status: AC

## 2022-03-29 MED ORDER — SODIUM CHLORIDE 0.9 % IV SOLN
1000.0000 mg/m2 | Freq: Once | INTRAVENOUS | Status: AC
  Administered 2022-03-29: 1786 mg via INTRAVENOUS
  Filled 2022-03-29: qty 46.97

## 2022-03-29 MED ORDER — SODIUM CHLORIDE 0.9% FLUSH
10.0000 mL | Freq: Once | INTRAVENOUS | Status: AC
  Administered 2022-03-29: 10 mL

## 2022-03-29 NOTE — Patient Instructions (Signed)
[facility]  Discharge Instructions: Thank you for choosing [facility] to provide your oncology and hematology care.   If you have a lab appointment with the [facility], please go directly to the [facility] and check in at the registration area.   Wear comfortable clothing and clothing appropriate for easy access to any Portacath or PICC line.   We strive to give you quality time with your provider. You may need to reschedule your appointment if you arrive late (15 or more minutes).  Arriving late affects you and other patients whose appointments are after yours.  Also, if you miss three or more appointments without notifying the office, you may be dismissed from the clinic at the provider's discretion.      For prescription refill requests, have your pharmacy contact our office and allow 72 hours for refills to be completed.    Today you received the following chemotherapy and/or immunotherapy agents: Abraxane/Gemzar      To help prevent nausea and vomiting after your treatment, we encourage you to take your nausea medication as directed.  BELOW ARE SYMPTOMS THAT SHOULD BE REPORTED IMMEDIATELY: *FEVER GREATER THAN 100.4 F (38 C) OR HIGHER *CHILLS OR SWEATING *NAUSEA AND VOMITING THAT IS NOT CONTROLLED WITH YOUR NAUSEA MEDICATION *UNUSUAL SHORTNESS OF BREATH *UNUSUAL BRUISING OR BLEEDING *URINARY PROBLEMS (pain or burning when urinating, or frequent urination) *BOWEL PROBLEMS (unusual diarrhea, constipation, pain near the anus) TENDERNESS IN MOUTH AND THROAT WITH OR WITHOUT PRESENCE OF ULCERS (sore throat, sores in mouth, or a toothache) UNUSUAL RASH, SWELLING OR PAIN  UNUSUAL VAGINAL DISCHARGE OR ITCHING   Items with * indicate a potential emergency and should be followed up as soon as possible or go to the Emergency Department if any problems should occur.  Please show the CHEMOTHERAPY ALERT CARD or IMMUNOTHERAPY ALERT CARD at  check-in to the Emergency Department and triage nurse.  Should you have questions after your visit or need to cancel or reschedule your appointment, please contact [facility]  Dept: [phone]  and follow the prompts.  Office hours are 8:00 a.m. to 4:30 p.m. Monday - Friday. Please note that voicemails left after 4:00 p.m. may not be returned until the following business day.  We are closed weekends and major holidays. You have access to a nurse at all times for urgent questions. Please call the main number to the clinic Dept: [phone] and follow the prompts.   For any non-urgent questions, you may also contact your provider using MyChart. We now offer e-Visits for anyone 1 and older to request care online for non-urgent symptoms. For details visit [email or web address].   Also download the MyChart app! Go to the app store, search "MyChart", open the app, select [facility], and log in with your MyChart username and password.  Due to Covid, a mask is required upon entering the hospital/clinic. If you do not have a mask, one will be given to you upon arrival. For doctor visits, patients may have 1 support person aged 50 or older with them. For treatment visits, patients cannot have anyone with them due to current Covid guidelines and our immunocompromised population.

## 2022-03-29 NOTE — Progress Notes (Signed)
[facility]   Telephone:([phone]) [phone] Fax:(336) [phone]   Clinic Follow up Note   Patient Care Team: [name], MD as PCP - General (Internal Medicine) Truitt Merle, MD as Consulting Physician (Oncology) Royston Bake, RN as Oncology Nurse Navigator (Oncology)  Date of Service:  03/29/2022  CHIEF COMPLAINT: f/u of pancreatic cancer  CURRENT THERAPY:  Neoadjuvant Gemcitabine/Abraxane, days 1 and 8 q21d, starting 02/16/22  ASSESSMENT & PLAN:  Miranda Francis is a 76 y.o. female with   1. Pancreatic Cancer, stage IB c(T2, N0, M0), borderline resectable, MMR normal   -presented to PCP with jaundice for one month. Also complained of epigastric pain, poor appetite, weight loss, and light-colored stool. Found to have elevated liver enzymes and referred to ED on 01/16/22. CMP in ED showed tbili at 39.3. CT AP showed: marked biliary dilatation; soft tissue mass at pancreatic head with marked narrowing of SMV; diverticular disease of colon.  No distant metastasis on CT scan. -baseline CA 19-9 on 01/16/22 was elevated at 9,561. -MRCP abdomen MRI on 01/17/22 showed: obstructing mass within pancreatic head with low-level enhancement, causing significant extrinsic narrowing at SMV and dilatation. -ERCP on 01/18/22 by Dr. Benson Norway, FNA of pancreas head mass showed malignant cells consistent with adenocarcinoma. MMR normal. -she began neoadjuvant gemcitabine alone on 02/16/22, abraxane was added with C2 (03/08/22).  She is tolerating well overall, but still has moderate fatigue, not physically active.  -her CA 19-9 initially rose but dropped again on 03/15/22, still above baseline level at 12,030. -she is tolerating treatment well overall. Labs reviewed, overall stable. Adequate to continue treatment.   2. Symptom Management: decreased appetite with weight loss, abdominal discomfort -she reports a ~40 lb weight loss over the last 4 months or so. She endorses trying to supplement with Ensure and  Boost. She notes trying to obtain Glucerna due to her DM. -she met with dietician on 02/14/22. -she was prescribed marinol by her PCP   3. Obstructive jaundice -presented with hyperbilirubinemia at 39.3 -s/p stent placement on 01/18/22 -tbili improving, down to 1.5 on 03/15/22, today's result is pending.   4. Anemia -hgb dropped to 6.4 by 01/25/22 while in the hospital. She received blood transfusion that day, and hgb recovered to 8.4 the same day. -hgb stable at 9.9 today (03/29/22)   5. Genetic Testing -she reports colon cancer in a brother (age 69) and a sister (age 42). -she proceeded with testing on 02/07/22. Results were negative.     PLAN: -proceed with C3 gem/Abraxane today and in 1 week -lab, flush, and gem/abraxane in 3 and 4 weeks             -she prefers Thursday late morning or later -f/u in 3 weeks as scheduled   No problem-specific Assessment & Plan notes found for this encounter.   SUMMARY OF ONCOLOGIC HISTORY: Oncology History Overview Note   Cancer Staging  Pancreatic cancer [facility]) Staging form: Exocrine Pancreas, AJCC 8th Edition - Clinical stage from 01/18/2022: Stage IB (cT2, cN0, cM0) - Signed by Truitt Merle, MD on 01/28/2022    Pancreatic cancer ([place])  01/16/2022 Imaging   CLINICAL DATA:  Jaundice   EXAM: CT ABDOMEN AND PELVIS WITH CONTRAST  IMPRESSION: 1. Marked intra and extrahepatic biliary dilatation with marked dilatation of pancreatic duct and distended gallbladder. There is significant atrophy of the body and tail of pancreas. Ill-defined soft tissue mass at the pancreatic head neck region with marked narrowing of the SMV by ill-defined mass, constellation  of findings concerning for neoplasm/pancreas carcinoma. 2. Diverticular disease of the colon without acute wall thickening 3. Mild endometrial thickening up to 9 mm, recommend nonemergent pelvic ultrasound for further evaluation   01/17/2022 Imaging   EXAM: MRI ABDOMEN WITHOUT AND WITH CONTRAST  (INCLUDING MRCP)  IMPRESSION: 1. Obstructing mass within the pancreatic head with an intraluminal component demonstrating low level enhancement. 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 2. This mass causes significant extrinsic narrowing at the portal/superior mesenteric vein confluence as well as marked intra and extrahepatic biliary dilatation. 3. No distant metastases identified. 4. Endoscopy for endoscopic ultrasound, tissue sampling and biliary decompression recommended.   01/18/2022 Cancer Staging   Staging form: Exocrine Pancreas, AJCC 8th Edition - Clinical stage from 01/18/2022: Stage IB (cT2, cN0, cM0) - Signed by Truitt Merle, MD on 01/28/2022 Stage prefix: Initial diagnosis Total positive nodes: 0    01/18/2022 Initial Biopsy   FINAL MICROSCOPIC DIAGNOSIS:  A. PANCREAS, HEAD, FINE NEEDLE ASPIRATION:  - Malignant cells present consistent with adenocarcinoma   01/24/2022 Imaging   EXAM: CT CHEST WITHOUT CONTRAST  IMPRESSION: 1. No evidence of metastatic disease in the chest. 2. Atelectasis or consolidation in the right lower lung. 3. Healing right rib fractures. 4. Mass in the head of the pancreas. See previous report of CT abdomen and pelvis 01/16/2022. Interval placement of the biliary stent.   01/28/2022 Initial Diagnosis   Pancreatic cancer ([place])    02/16/2022 -  Chemotherapy   Patient is on Treatment Plan : PANCREATIC Abraxane / Gemcitabine D1,8 q21d      02/16/2022 Genetic Testing   Negative genetic testing on the CancerNext-Expanded+RNAinsight panel.  The report date is February 16, 2022.  The CancerNext-Expanded gene panel offered by [facility] and includes sequencing and rearrangement analysis for the following 77 genes: AIP, ALK, APC*, ATM*, AXIN2, BAP1, BARD1, BLM, BMPR1A, BRCA1*, BRCA2*, BRIP1*,  CDC73, CDH1*, CDK4, CDKN1B, CDKN2A, CHEK2*, CTNNA1, DICER1, FANCC, FH, FLCN, GALNT12, KIF1B, LZTR1, MAX, MEN1, MET, MLH1*, MSH2*, MSH3, MSH6*, MUTYH*, NBN, NF1*, NF2, NTHL1, PALB2*, PHOX2B, PMS2*, POT1, PRKAR1A, PTCH1, PTEN*, RAD51C*, RAD51D*, RB1, RECQL, RET, SDHA, SDHAF2, SDHB, SDHC, SDHD, SMAD4, SMARCA4, SMARCB1, SMARCE1, STK11, SUFU, TMEM127, TP53*, TSC1, TSC2, VHL and XRCC2 (sequencing and deletion/duplication); EGFR, EGLN1, HOXB13, KIT, MITF, PDGFRA, POLD1, and POLE (sequencing only); EPCAM and GREM1 (deletion/duplication only). DNA and RNA analyses performed for * genes.       INTERVAL HISTORY:  Cicley Ganesh is here for a follow up of pancreatic cancer. She was last seen by PA Cassie on 02/23/22. She presents to the clinic accompanied by her husband. She reports she did well overall with her last two cycles since I saw her last. She reports she experienced nausea for the first time this morning when she woke up. Her husband reports she has no energy; she adds that she is able to care for herself and do some small chores around the house. She notes the lack of energy was present prior to the start of chemo. She denies fevers or chills but does note her temperature will fluctuate. She tells me she was prescribed marinol by her PCP, which her husband feels helps her.   All other systems were reviewed with the patient and are negative.  MEDICAL HISTORY:  Past Medical History:  Diagnosis Date   Blood transfusion    Diabetes mellitus    Family  history of breast cancer    Family history of colon cancer    Family history of prostate cancer    Heart murmur    Hypertension     SURGICAL HISTORY: Past Surgical History:  Procedure Laterality Date   BILIARY STENT PLACEMENT  01/18/2022   Procedure: BILIARY STENT PLACEMENT;  Surgeon: Jeani Hawking, MD;  Location: [facility] ENDOSCOPY;  Service: Gastroenterology;;   DILATION AND CURETTAGE OF UTERUS     ERCP N/A 01/18/2022   Procedure: ENDOSCOPIC RETROGRADE  CHOLANGIOPANCREATOGRAPHY (ERCP);  Surgeon: Jeani Hawking, MD;  Location: [facility] ENDOSCOPY;  Service: Gastroenterology;  Laterality: N/A;   ESOPHAGOGASTRODUODENOSCOPY N/A 01/18/2022   Procedure: ESOPHAGOGASTRODUODENOSCOPY (EGD);  Surgeon: Jeani Hawking, MD;  Location: [facility] ENDOSCOPY;  Service: Gastroenterology;  Laterality: N/A;   EUS  01/18/2022   Procedure: UPPER ENDOSCOPIC ULTRASOUND (EUS) LINEAR;  Surgeon: Jeani Hawking, MD;  Location: [facility] ENDOSCOPY;  Service: Gastroenterology;;   FINE NEEDLE ASPIRATION  01/18/2022   Procedure: FINE NEEDLE ASPIRATION (FNA) LINEAR;  Surgeon: Jeani Hawking, MD;  Location: [facility] ENDOSCOPY;  Service: Gastroenterology;;   IR IMAGING GUIDED PORT INSERTION  02/15/2022   NO PAST SURGERIES     SPHINCTEROTOMY  01/18/2022   Procedure: Dennison Mascot;  Surgeon: Jeani Hawking, MD;  Location: [facility] ENDOSCOPY;  Service: Gastroenterology;;    I have reviewed the social history and family history with the patient and they are unchanged from previous note.  ALLERGIES:  has No Known Allergies.  MEDICATIONS:  Current Outpatient Medications  Medication Sig Dispense Refill   acetaminophen (TYLENOL) 500 MG tablet Take 1,000 mg by mouth every 6 (six) hours as needed for moderate pain or headache.     amLODipine (NORVASC) 5 MG tablet Take 5 mg by mouth daily.     Cholecalciferol (QC VITAMIN D3) 50 MCG (2000 UT) TABS Take 2,000 Units by mouth daily.     dronabinol (MARINOL) 5 MG capsule Take 5 mg by mouth 2 (two) times daily before a meal.     glipiZIDE (GLUCOTROL) 5 MG tablet Take 2.5 mg by mouth 2 (two) times daily with a meal.     KLOR-CON M20 20 MEQ tablet Take 20 mEq by mouth 2 (two) times daily.     lidocaine-prilocaine (EMLA) cream Apply to affected area once 30 g 3   metFORMIN (GLUCOPHAGE) 500 MG tablet Take by mouth daily with breakfast.     mirtazapine (REMERON) 7.5 MG tablet Take 1 tablet (7.5 mg total) by mouth at bedtime. 30 tablet 2   ondansetron (ZOFRAN) 8 MG tablet Take 1 tablet (8  mg total) by mouth 2 (two) times daily as needed (Nausea or vomiting). 30 tablet 1   oxyCODONE (OXY IR/ROXICODONE) 5 MG immediate release tablet Take 1 tablet (5 mg total) by mouth every 3 (three) hours as needed for severe pain. 20 tablet 0   polyethylene glycol (MIRALAX / GLYCOLAX) 17 g packet Take 17 g by mouth daily. 14 each 0   prochlorperazine (COMPAZINE) 10 MG tablet Take 1 tablet (10 mg total) by mouth every 6 (six) hours as needed (Nausea or vomiting). 30 tablet 1   cholestyramine light (PREVALITE) 4 g packet Take 1 packet (4 g total) by mouth 2 (two) times daily. 60 packet 0   No current facility-administered medications for this visit.   Facility-Administered Medications Ordered in Other Visits  Medication Dose Route Frequency Provider Last Rate Last Admin   sodium chloride flush (NS) 0.9 % injection 10 mL  10 mL Intracatheter PRN Malachy Mood, MD  10 mL at 03/29/22 1334    PHYSICAL EXAMINATION: ECOG PERFORMANCE STATUS: 2 - Symptomatic, <50% confined to bed  Vitals:   03/29/22 1022  BP: (!) 149/82  Pulse: 71  Resp: 18  Temp: 98.7 F (37.1 C)  SpO2: 98%   Wt Readings from Last 3 Encounters:  03/29/22 148 lb 14.4 oz (67.5 kg)  03/15/22 149 lb 8 oz (67.8 kg)  03/08/22 150 lb 11.2 oz (68.4 kg)     GENERAL:alert, no distress and comfortable SKIN: skin color normal, no rashes or significant lesions EYES: normal, Conjunctiva are pink and non-injected, sclera clear  NEURO: alert & oriented x 3 with fluent speech  LABORATORY DATA:  I have reviewed the data as listed    Latest Ref Rng & Units 03/29/2022    9:56 AM 03/15/2022    1:08 PM 03/08/2022    1:12 PM  CBC  WBC 4.0 - 10.5 K/uL 9.5   4.8   6.3    Hemoglobin 12.0 - 15.0 g/dL 9.9   9.8   9.6    Hematocrit 36.0 - 46.0 % 29.4   29.4   29.3    Platelets 150 - 400 K/uL 247   325   302          Latest Ref Rng & Units 03/29/2022    9:56 AM 03/15/2022    1:08 PM 03/08/2022    1:12 PM  CMP  Glucose 70 - 99 mg/dL 177   129    188    BUN 8 - 23 mg/dL $Remove'11   10   11    'FbTguEe$ Creatinine 0.44 - 1.00 mg/dL 0.50   0.42   0.50    Sodium 135 - 145 mmol/L 138   138   137    Potassium 3.5 - 5.1 mmol/L 3.5   3.8   4.0    Chloride 98 - 111 mmol/L 105   106   105    CO2 22 - 32 mmol/L $RemoveB'24   26   25    'nMNliSxo$ Calcium 8.9 - 10.3 mg/dL 9.2   9.3   9.3    Total Protein 6.5 - 8.1 g/dL 7.2   7.0   6.7    Total Bilirubin 0.3 - 1.2 mg/dL 1.1   1.5   1.8    Alkaline Phos 38 - 126 U/L 157   167   185    AST 15 - 41 U/L $Remo'16   15   14    'njtLp$ ALT 0 - 44 U/L $Remo'12   12   12        'nmQpm$ RADIOGRAPHIC STUDIES: I have personally reviewed the radiological images as listed and agreed with the findings in the report. No results found.    No orders of the defined types were placed in this encounter.  All questions were answered. The patient knows to call the clinic with any problems, questions or concerns. No barriers to learning was detected. The total time spent in the appointment was 30 minutes.     Truitt Merle, MD 03/29/2022   I, Wilburn Mylar, am acting as scribe for Truitt Merle, MD.   I have reviewed the above documentation for accuracy and completeness, and I agree with the above.

## 2022-04-01 ENCOUNTER — Other Ambulatory Visit: Payer: Self-pay | Admitting: Physician Assistant

## 2022-04-01 DIAGNOSIS — R630 Anorexia: Secondary | ICD-10-CM

## 2022-04-02 ENCOUNTER — Encounter: Payer: Self-pay | Admitting: Hematology

## 2022-04-05 ENCOUNTER — Inpatient Hospital Stay: Payer: Medicare HMO

## 2022-04-05 ENCOUNTER — Other Ambulatory Visit: Payer: Self-pay

## 2022-04-05 ENCOUNTER — Encounter: Payer: Self-pay | Admitting: General Practice

## 2022-04-05 VITALS — BP 128/73 | HR 56 | Temp 98.5°F | Resp 16 | Ht 63.0 in | Wt 148.2 lb

## 2022-04-05 DIAGNOSIS — Z5111 Encounter for antineoplastic chemotherapy: Secondary | ICD-10-CM | POA: Diagnosis not present

## 2022-04-05 DIAGNOSIS — Z95828 Presence of other vascular implants and grafts: Secondary | ICD-10-CM

## 2022-04-05 DIAGNOSIS — C250 Malignant neoplasm of head of pancreas: Secondary | ICD-10-CM

## 2022-04-05 LAB — CMP (CANCER CENTER ONLY)
ALT: 11 U/L (ref 0–44)
AST: 14 U/L — ABNORMAL LOW (ref 15–41)
Albumin: 3.7 g/dL (ref 3.5–5.0)
Alkaline Phosphatase: 149 U/L — ABNORMAL HIGH (ref 38–126)
Anion gap: 6 (ref 5–15)
BUN: 7 mg/dL — ABNORMAL LOW (ref 8–23)
CO2: 25 mmol/L (ref 22–32)
Calcium: 9.2 mg/dL (ref 8.9–10.3)
Chloride: 104 mmol/L (ref 98–111)
Creatinine: 0.54 mg/dL (ref 0.44–1.00)
GFR, Estimated: >60 mL/min (ref >60)
Glucose, Bld: 199 mg/dL — ABNORMAL HIGH (ref 70–99)
Potassium: 3.8 mmol/L (ref 3.5–5.1)
Sodium: 135 mmol/L (ref 135–145)
Total Bilirubin: 0.9 mg/dL (ref 0.3–1.2)
Total Protein: 6.9 g/dL (ref 6.5–8.1)

## 2022-04-05 LAB — CBC WITH DIFFERENTIAL (CANCER CENTER ONLY)
Abs Immature Granulocytes: 0.02 10*3/uL (ref 0.00–0.07)
Basophils Absolute: 0.1 10*3/uL (ref 0.0–0.1)
Basophils Relative: 1 %
Eosinophils Absolute: 0 10*3/uL (ref 0.0–0.5)
Eosinophils Relative: 1 %
HCT: 28.4 % — ABNORMAL LOW (ref 36.0–46.0)
Hemoglobin: 9.7 g/dL — ABNORMAL LOW (ref 12.0–15.0)
Immature Granulocytes: 1 %
Lymphocytes Relative: 70 %
Lymphs Abs: 3 10*3/uL (ref 0.7–4.0)
MCH: 32 pg (ref 26.0–34.0)
MCHC: 34.2 g/dL (ref 30.0–36.0)
MCV: 93.7 fL (ref 80.0–100.0)
Monocytes Absolute: 0.2 10*3/uL (ref 0.1–1.0)
Monocytes Relative: 4 %
Neutro Abs: 1 10*3/uL — ABNORMAL LOW (ref 1.7–7.7)
Neutrophils Relative %: 23 %
Platelet Count: 242 10*3/uL (ref 150–400)
RBC: 3.03 MIL/uL — ABNORMAL LOW (ref 3.87–5.11)
RDW: 15.1 % (ref 11.5–15.5)
WBC Count: 4.2 10*3/uL (ref 4.0–10.5)
nRBC: 0 % (ref 0.0–0.2)

## 2022-04-05 MED ORDER — SODIUM CHLORIDE 0.9 % IV SOLN
1000.0000 mg/m2 | Freq: Once | INTRAVENOUS | Status: AC
  Administered 2022-04-05: 1786 mg via INTRAVENOUS
  Filled 2022-04-05: qty 46.97

## 2022-04-05 MED ORDER — SODIUM CHLORIDE 0.9 % IV SOLN: Freq: Once | INTRAVENOUS | Status: AC

## 2022-04-05 MED ORDER — PACLITAXEL PROTEIN-BOUND CHEMO INJECTION 100 MG
100.0000 mg/m2 | Freq: Once | INTRAVENOUS | Status: AC
  Administered 2022-04-05: 175 mg via INTRAVENOUS
  Filled 2022-04-05: qty 35

## 2022-04-05 MED ORDER — PROCHLORPERAZINE MALEATE 10 MG PO TABS
10.0000 mg | ORAL_TABLET | Freq: Once | ORAL | Status: AC
  Administered 2022-04-05: 10 mg via ORAL
  Filled 2022-04-05: qty 1

## 2022-04-05 MED ORDER — HEPARIN SOD (PORK) LOCK FLUSH 100 UNIT/ML IV SOLN
500.0000 [IU] | Freq: Once | INTRAVENOUS | Status: AC | PRN
  Administered 2022-04-05: 500 [IU]

## 2022-04-05 MED ORDER — SODIUM CHLORIDE 0.9% FLUSH
10.0000 mL | INTRAVENOUS | Status: DC | PRN
  Administered 2022-04-05: 10 mL

## 2022-04-05 MED ORDER — SODIUM CHLORIDE 0.9% FLUSH
10.0000 mL | Freq: Once | INTRAVENOUS | Status: AC
  Administered 2022-04-05: 10 mL

## 2022-04-05 NOTE — Patient Instructions (Signed)
[facility]  Discharge Instructions: Thank you for choosing [facility] to provide your oncology and hematology care.   If you have a lab appointment with the [facility], please go directly to the [facility] and check in at the registration area.   Wear comfortable clothing and clothing appropriate for easy access to any Portacath or PICC line.   We strive to give you quality time with your provider. You may need to reschedule your appointment if you arrive late (15 or more minutes).  Arriving late affects you and other patients whose appointments are after yours.  Also, if you miss three or more appointments without notifying the office, you may be dismissed from the clinic at the provider's discretion.      For prescription refill requests, have your pharmacy contact our office and allow 72 hours for refills to be completed.    Today you received the following chemotherapy and/or immunotherapy agents: Abraxane/Gemzar      To help prevent nausea and vomiting after your treatment, we encourage you to take your nausea medication as directed.  BELOW ARE SYMPTOMS THAT SHOULD BE REPORTED IMMEDIATELY: *FEVER GREATER THAN 100.4 F (38 C) OR HIGHER *CHILLS OR SWEATING *NAUSEA AND VOMITING THAT IS NOT CONTROLLED WITH YOUR NAUSEA MEDICATION *UNUSUAL SHORTNESS OF BREATH *UNUSUAL BRUISING OR BLEEDING *URINARY PROBLEMS (pain or burning when urinating, or frequent urination) *BOWEL PROBLEMS (unusual diarrhea, constipation, pain near the anus) TENDERNESS IN MOUTH AND THROAT WITH OR WITHOUT PRESENCE OF ULCERS (sore throat, sores in mouth, or a toothache) UNUSUAL RASH, SWELLING OR PAIN  UNUSUAL VAGINAL DISCHARGE OR ITCHING   Items with * indicate a potential emergency and should be followed up as soon as possible or go to the Emergency Department if any problems should occur.  Please show the CHEMOTHERAPY ALERT CARD or IMMUNOTHERAPY ALERT CARD at  check-in to the Emergency Department and triage nurse.  Should you have questions after your visit or need to cancel or reschedule your appointment, please contact [facility]  Dept: [phone]  and follow the prompts.  Office hours are 8:00 a.m. to 4:30 p.m. Monday - Friday. Please note that voicemails left after 4:00 p.m. may not be returned until the following business day.  We are closed weekends and major holidays. You have access to a nurse at all times for urgent questions. Please call the main number to the clinic Dept: [phone] and follow the prompts.   For any non-urgent questions, you may also contact your provider using MyChart. We now offer e-Visits for anyone 75 and older to request care online for non-urgent symptoms. For details visit [email or web address].   Also download the MyChart app! Go to the app store, search "MyChart", open the app, select [facility], and log in with your MyChart username and password.  Due to Covid, a mask is required upon entering the hospital/clinic. If you do not have a mask, one will be given to you upon arrival. For doctor visits, patients may have 1 support person aged 29 or older with them. For treatment visits, patients cannot have anyone with them due to current Covid guidelines and our immunocompromised population.

## 2022-04-05 NOTE — Progress Notes (Signed)
[facility] Spiritual Care Note  Referred by infusion team for emotional support. Brought handmade blessing blanket as a tangible sign of encouragement, introducing Spiritual Care as part of Ms Jacobson's support team. Also provided Spiritual Care brochure and ensured that she is aware of ongoing chaplain availability. She reports no other needs at this time.   [place], [place], [facility] Pager [phone] Voicemail [phone]

## 2022-04-05 NOTE — Progress Notes (Signed)
D 8/ C3 Abraxane and gemzar. OK to treat with ANC 1.0 per Dr. Burr Medico

## 2022-04-06 LAB — CANCER ANTIGEN 19-9: CA 19-9: 5426 U/mL — ABNORMAL HIGH (ref 0–35)

## 2022-04-17 NOTE — Progress Notes (Signed)
[place]   [phone] Fax:([phone]) [phone]   Clinic Follow up Note   Patient Care Team: [name], MD as PCP - General (Internal Medicine) Truitt Merle, MD as Consulting Physician (Oncology) Royston Bake, RN as Oncology Nurse Navigator (Oncology) 04/19/2022  CHIEF COMPLAINT: Follow up pancreatic cancer   SUMMARY OF ONCOLOGIC HISTORY: Oncology History Overview Note   Cancer Staging  Pancreatic cancer [facility]) Staging form: Exocrine Pancreas, AJCC 8th Edition - Clinical stage from 01/18/2022: Stage IB (cT2, cN0, cM0) - Signed by Truitt Merle, MD on 01/28/2022    Pancreatic cancer ([place])  01/16/2022 Imaging   CLINICAL DATA:  Jaundice   EXAM: CT ABDOMEN AND PELVIS WITH CONTRAST  IMPRESSION: 1. Marked intra and extrahepatic biliary dilatation with marked dilatation of pancreatic duct and distended gallbladder. There is significant atrophy of the body and tail of pancreas. 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 2. Diverticular disease of the colon without acute wall thickening 3. Mild endometrial thickening up to 9 mm, recommend nonemergent pelvic ultrasound for further evaluation   01/17/2022 Imaging   EXAM: MRI ABDOMEN WITHOUT AND WITH CONTRAST (INCLUDING MRCP)  IMPRESSION: 1. Obstructing mass within the pancreatic head with an intraluminal component demonstrating low level enhancement. 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 2. This mass causes significant extrinsic narrowing at the portal/superior mesenteric vein confluence as well as marked intra and extrahepatic biliary dilatation. 3. No distant metastases identified. 4. Endoscopy for  endoscopic ultrasound, tissue sampling and biliary decompression recommended.   01/18/2022 Cancer Staging   Staging form: Exocrine Pancreas, AJCC 8th Edition - Clinical stage from 01/18/2022: Stage IB (cT2, cN0, cM0) - Signed by Truitt Merle, MD on 01/28/2022 Stage prefix: Initial diagnosis Total positive nodes: 0   01/18/2022 Initial Biopsy   FINAL MICROSCOPIC DIAGNOSIS:  A. PANCREAS, HEAD, FINE NEEDLE ASPIRATION:  - Malignant cells present consistent with adenocarcinoma   01/24/2022 Imaging   EXAM: CT CHEST WITHOUT CONTRAST  IMPRESSION: 1. No evidence of metastatic disease in the chest. 2. Atelectasis or consolidation in the right lower lung. 3. Healing right rib fractures. 4. Mass in the head of the pancreas. See previous report of CT abdomen and pelvis 01/16/2022. Interval placement of the biliary stent.   01/28/2022 Initial Diagnosis   Pancreatic cancer ([place])   02/16/2022 -  Chemotherapy   Patient is on Treatment Plan : PANCREATIC Abraxane / Gemcitabine D1,8 q21d     02/16/2022 Genetic Testing   Negative genetic testing on the CancerNext-Expanded+RNAinsight panel.  The report date is February 16, 2022.  The CancerNext-Expanded gene panel offered by [facility] and includes sequencing and rearrangement analysis for the following 77 genes: AIP, ALK, APC*, ATM*, AXIN2, BAP1, BARD1, BLM, BMPR1A, BRCA1*, BRCA2*, BRIP1*, CDC73, CDH1*, CDK4, CDKN1B, CDKN2A, CHEK2*, CTNNA1, DICER1, FANCC, FH, FLCN, GALNT12, KIF1B, LZTR1, MAX, MEN1, MET, MLH1*, MSH2*, MSH3, MSH6*, MUTYH*, NBN, NF1*, NF2, NTHL1, PALB2*, PHOX2B, PMS2*, POT1, PRKAR1A, PTCH1, PTEN*, RAD51C*, RAD51D*, RB1, RECQL, RET, SDHA, SDHAF2, SDHB, SDHC, SDHD, SMAD4, SMARCA4, SMARCB1, SMARCE1, STK11, SUFU, TMEM127, TP53*, TSC1, TSC2, VHL and XRCC2 (sequencing and deletion/duplication); EGFR, EGLN1, HOXB13, KIT, MITF, PDGFRA, POLD1, and POLE (sequencing only); EPCAM and GREM1 (deletion/duplication only). DNA and RNA analyses performed for * genes.  CURRENT THERAPY: Neoadjuvant Gemcitabine days 1 and 8 q21 days, starting 02/16/22; Abraxane added with C2 03/08/22  INTERVAL HISTORY: Miranda Francis returns for follow up and treatment as scheduled. Last seen by Dr. Burr Medico 03/29/22 and completed cycle 3.  The patient remembers having 1 bad day with nausea and vomiting but her husband reports she throws up "all day for 4 days after each treatment."  She is not able to eat or drink much.  She takes Zofran twice daily which is not very effective.  She eventually recovers well after that time.  Mirtazapine has helped her appetite but she has lost 4 pounds since last visit.  Bowels moving better on Senokot.  Abdominal pain is stable, mainly at night and well managed with oxycodone once daily.  She has noticed some tingling in her legs and feet, she did not use cryotherapy with last infusion.  She stays cold but denies fever or chills.  Denies cough, chest pain, dyspnea, leg edema, or any other new specific complaints.  All other systems were reviewed with the patient and are negative.  MEDICAL HISTORY:  Past Medical History:  Diagnosis Date   Blood transfusion    Diabetes mellitus    Family history of breast cancer    Family history of colon cancer    Family history of prostate cancer    Heart murmur    Hypertension     SURGICAL HISTORY: Past Surgical History:  Procedure Laterality Date   BILIARY STENT PLACEMENT  01/18/2022   Procedure: BILIARY STENT PLACEMENT;  Surgeon: Carol Ada, MD;  Location: [place];  Service: Gastroenterology;;   DILATION AND CURETTAGE OF UTERUS     ERCP N/A 01/18/2022   Procedure: ENDOSCOPIC RETROGRADE CHOLANGIOPANCREATOGRAPHY (ERCP);  Surgeon: Carol Ada, MD;  Location: [place];  Service: Gastroenterology;  Laterality: N/A;   ESOPHAGOGASTRODUODENOSCOPY N/A 01/18/2022   Procedure: ESOPHAGOGASTRODUODENOSCOPY (EGD);  Surgeon: Carol Ada, MD;  Location: [place];  Service: Gastroenterology;  Laterality:  N/A;   EUS  01/18/2022   Procedure: UPPER ENDOSCOPIC ULTRASOUND (EUS) LINEAR;  Surgeon: Carol Ada, MD;  Location: [facility] ENDOSCOPY;  Service: Gastroenterology;;   FINE NEEDLE ASPIRATION  01/18/2022   Procedure: FINE NEEDLE ASPIRATION (FNA) LINEAR;  Surgeon: Carol Ada, MD;  Location: [facility] ENDOSCOPY;  Service: Gastroenterology;;   IR IMAGING GUIDED PORT INSERTION  02/15/2022   NO PAST SURGERIES     SPHINCTEROTOMY  01/18/2022   Procedure: Joan Mayans;  Surgeon: Carol Ada, MD;  Location: [place];  Service: Gastroenterology;;    I have reviewed the social history and family history with the patient and they are unchanged from previous note.  ALLERGIES:  has No Known Allergies.  MEDICATIONS:  Current Outpatient Medications  Medication Sig Dispense Refill   acetaminophen (TYLENOL) 500 MG tablet Take 1,000 mg by mouth every 6 (six) hours as needed for moderate pain or headache.     amLODipine (NORVASC) 5 MG tablet Take 5 mg by mouth daily.     Cholecalciferol (QC VITAMIN D3) 50 MCG (2000 UT) TABS Take 2,000 Units by mouth daily.     cholestyramine light (PREVALITE) 4 g packet Take 1 packet (4 g total) by mouth 2 (two) times daily. 60 packet 0   dronabinol (MARINOL) 5 MG capsule Take 5 mg by mouth 2 (two) times daily before a meal.     glipiZIDE (GLUCOTROL) 5 MG tablet Take 2.5 mg by mouth 2 (two) times daily with a meal.     KLOR-CON M20 20 MEQ tablet Take 20  mEq by mouth 2 (two) times daily.     lidocaine-prilocaine (EMLA) cream Apply to affected area once 30 g 3   metFORMIN (GLUCOPHAGE) 500 MG tablet Take by mouth daily with breakfast.     mirtazapine (REMERON) 7.5 MG tablet TAKE 1 TABLET BY MOUTH AT BEDTIME. 90 tablet 1   ondansetron (ZOFRAN) 8 MG tablet Take 1 tablet (8 mg total) by mouth 2 (two) times daily as needed (Nausea or vomiting). 30 tablet 1   oxyCODONE (OXY IR/ROXICODONE) 5 MG immediate release tablet Take 1 tablet (5 mg total) by mouth every 3 (three) hours as needed for  severe pain. 20 tablet 0   polyethylene glycol (MIRALAX / GLYCOLAX) 17 g packet Take 17 g by mouth daily. 14 each 0   prochlorperazine (COMPAZINE) 10 MG tablet Take 1 tablet (10 mg total) by mouth every 6 (six) hours as needed (Nausea or vomiting). 30 tablet 1   No current facility-administered medications for this visit.   Facility-Administered Medications Ordered in Other Visits  Medication Dose Route Frequency Provider Last Rate Last Admin   sodium chloride flush (NS) 0.9 % injection 10 mL  10 mL Intracatheter PRN Truitt Merle, MD   10 mL at 04/19/22 1317    PHYSICAL EXAMINATION: ECOG PERFORMANCE STATUS: 1 - Symptomatic but completely ambulatory  Vitals:   04/19/22 0916  BP: 132/83  Pulse: 67  Resp: 18  Temp: 98.2 F (36.8 C)  SpO2: 96%   Filed Weights   04/19/22 0916  Weight: 144 lb (65.3 kg)    GENERAL:alert, no distress and comfortable SKIN: No rash EYES: sclera clear LUNGS: clear with normal breathing effort HEART: regular rate & rhythm, no lower extremity edema ABDOMEN:abdomen soft, non-tender and normal bowel sounds NEURO: alert & oriented x 3 with fluent speech, no focal motor/sensory deficits PAC without erythema  LABORATORY DATA:  I have reviewed the data as listed    Latest Ref Rng & Units 04/19/2022    9:01 AM 04/05/2022    1:27 PM 03/29/2022    9:56 AM  CBC  WBC 4.0 - 10.5 K/uL 7.0  4.2  9.5   Hemoglobin 12.0 - 15.0 g/dL 10.0  9.7  9.9   Hematocrit 36.0 - 46.0 % 29.7  28.4  29.4   Platelets 150 - 400 K/uL 236  242  247         Latest Ref Rng & Units 04/19/2022    9:01 AM 04/05/2022    1:27 PM 03/29/2022    9:56 AM  CMP  Glucose 70 - 99 mg/dL 248  199  177   BUN 8 - 23 mg/dL $Remove'10  7  11   'NsQNBkb$ Creatinine 0.44 - 1.00 mg/dL 0.56  0.54  0.50   Sodium 135 - 145 mmol/L 138  135  138   Potassium 3.5 - 5.1 mmol/L 3.5  3.8  3.5   Chloride 98 - 111 mmol/L 108  104  105   CO2 22 - 32 mmol/L $RemoveB'23  25  24   'FnxIbCMR$ Calcium 8.9 - 10.3 mg/dL 9.3  9.2  9.2   Total Protein 6.5 -  8.1 g/dL 6.4  6.9  7.2   Total Bilirubin 0.3 - 1.2 mg/dL 0.6  0.9  1.1   Alkaline Phos 38 - 126 U/L 130  149  157   AST 15 - 41 U/L $Remo'13  14  16   'BGTzj$ ALT 0 - 44 U/L $Remo'10  11  12       'xmLZa$ RADIOGRAPHIC  STUDIES: I have personally reviewed the radiological images as listed and agreed with the findings in the report. No results found.   ASSESSMENT & PLAN: Miranda Francis is a 76 y.o. female with    1. Pancreatic Cancer, stage IB c(T2, N0, M0), borderline resectable, MMR normal   -presented to PCP with jaundice for one month. Also complained of epigastric pain, poor appetite, weight loss, and light-colored stool. Found to have elevated liver enzymes and referred to ED on 01/16/22. CMP in ED showed tbili at 39.3. CT AP showed: marked biliary dilatation; soft tissue mass at pancreatic head with marked narrowing of SMV; diverticular disease of colon.  No distant metastasis on CT scan. -baseline CA 19-9 on 01/16/22 was elevated at 9,561. -MRCP abdomen MRI on 01/17/22 showed: obstructing mass within pancreatic head with low-level enhancement, causing significant extrinsic narrowing at SMV and dilatation. -ERCP on 01/18/22 by Dr. Benson Norway, FNA of pancreas head mass showed malignant cells consistent with adenocarcinoma. MMR normal. -She was seen by Dr. Michaelle Birks for felt her to be borderline resectable; she agreed with neoadjuvant chemo for 6-8 cycles then reassess -she began neoadjuvant gemcitabine alone on 02/16/22, abraxane was added with C2 (03/08/22).  She has tolerated well except moderate fatigue -her CA 19-9 initially increased > 15K but is currently trending down on treatment (12K then 5K) -Miranda Francis appears stable.  She completed 3 cycles of neoadjuvant gem/Abraxane.  She tolerated this last dose poorly with nausea/vomiting and weight loss for 4 days after days 1 and 8.  Side effects were not well managed with Zofran alone, she did not call us.  She was able to recover well on her week off and eat better. -Labs  reviewed, CBC and CMP are stable, adequate to proceed with cycle 4-day 1 gem/Abraxane today as planned.  I discussed a dose reduction given her n/v, she declined and wants to push forward on the same dose -I am adding Aloxi, Decadron, and Emend premeds.  I encouraged her to add Compazine at home.  I offered her IV fluids on Saturday, she declined.   -Add follow-up next week with C4 D8 for close monitoring   2. Symptom Management: decreased appetite with weight loss, abdominal discomfort -she reports a ~40 lb weight loss over the last 4 months or so. Trying ensure, boost, and glucerna -f/up dietician today -marinol prescribed by PCP was not helpful, mirtazapine works better -increase chemo premeds, and add compazine at home, continue zofran -she does not want to decrease chemo dose and declined IVF. Will monitor her closely    3. Obstructive jaundice -presented with hyperbilirubinemia at 39.3 -s/p stent placement on 01/18/22 -tbili improving, down to 1.5 on 03/15/22, normalized   4. Anemia -hgb dropped to 6.4 by 01/25/22 while in the hospital. She received blood transfusion that day, and hgb recovered to 8.4 the same day. -stable now   5. Genetic Testing -she reports colon cancer in a brother (age 11) and a sister (age 34). -she proceeded with testing on 02/07/22. Results were negative.     PLAN: -Labs reviewed, adequate for cycle 4-day 1 gemcitabine and Abraxane -Change premeds to Aloxi, Decadron, and Emend (authorization was obtained); continue Zofran and add Compazine at home -I offered IV fluids on Saturday, she declined for now -Follow-up next week prior to C4D8 for close monitoring of nausea/vomiting -Follow-up and cycle 5 in 3 weeks   All questions were answered. The patient knows to call the clinic with any problems, questions or concerns. No  barriers to learning was detected. I spent 20 minutes counseling the patient face to face. The total time spent in the appointment was 30  minutes and more than 50% was on counseling and review of test results     Alla Feeling, NP 04/19/22

## 2022-04-19 ENCOUNTER — Encounter: Payer: Self-pay | Admitting: Nurse Practitioner

## 2022-04-19 ENCOUNTER — Inpatient Hospital Stay (HOSPITAL_BASED_OUTPATIENT_CLINIC_OR_DEPARTMENT_OTHER): Payer: Medicare HMO | Admitting: Nurse Practitioner

## 2022-04-19 ENCOUNTER — Other Ambulatory Visit: Payer: Self-pay

## 2022-04-19 ENCOUNTER — Inpatient Hospital Stay: Payer: Medicare HMO | Attending: Physician Assistant

## 2022-04-19 ENCOUNTER — Inpatient Hospital Stay: Payer: Medicare HMO

## 2022-04-19 ENCOUNTER — Inpatient Hospital Stay: Payer: Medicare HMO | Admitting: Nutrition

## 2022-04-19 VITALS — BP 132/83 | HR 67 | Temp 98.2°F | Resp 18 | Ht 63.0 in | Wt 144.0 lb

## 2022-04-19 DIAGNOSIS — D649 Anemia, unspecified: Secondary | ICD-10-CM | POA: Insufficient documentation

## 2022-04-19 DIAGNOSIS — Z5111 Encounter for antineoplastic chemotherapy: Secondary | ICD-10-CM | POA: Insufficient documentation

## 2022-04-19 DIAGNOSIS — Z95828 Presence of other vascular implants and grafts: Secondary | ICD-10-CM

## 2022-04-19 DIAGNOSIS — E1165 Type 2 diabetes mellitus with hyperglycemia: Secondary | ICD-10-CM | POA: Insufficient documentation

## 2022-04-19 DIAGNOSIS — C250 Malignant neoplasm of head of pancreas: Secondary | ICD-10-CM | POA: Diagnosis present

## 2022-04-19 DIAGNOSIS — Z79899 Other long term (current) drug therapy: Secondary | ICD-10-CM | POA: Insufficient documentation

## 2022-04-19 DIAGNOSIS — K831 Obstruction of bile duct: Secondary | ICD-10-CM | POA: Insufficient documentation

## 2022-04-19 LAB — CBC WITH DIFFERENTIAL (CANCER CENTER ONLY)
Abs Immature Granulocytes: 0.01 10*3/uL (ref 0.00–0.07)
Basophils Absolute: 0.1 10*3/uL (ref 0.0–0.1)
Basophils Relative: 1 %
Eosinophils Absolute: 0.2 10*3/uL (ref 0.0–0.5)
Eosinophils Relative: 2 %
HCT: 29.7 % — ABNORMAL LOW (ref 36.0–46.0)
Hemoglobin: 10 g/dL — ABNORMAL LOW (ref 12.0–15.0)
Immature Granulocytes: 0 %
Lymphocytes Relative: 31 %
Lymphs Abs: 2.2 10*3/uL (ref 0.7–4.0)
MCH: 31.3 pg (ref 26.0–34.0)
MCHC: 33.7 g/dL (ref 30.0–36.0)
MCV: 93.1 fL (ref 80.0–100.0)
Monocytes Absolute: 0.5 10*3/uL (ref 0.1–1.0)
Monocytes Relative: 7 %
Neutro Abs: 4.1 10*3/uL (ref 1.7–7.7)
Neutrophils Relative %: 59 %
Platelet Count: 236 10*3/uL (ref 150–400)
RBC: 3.19 MIL/uL — ABNORMAL LOW (ref 3.87–5.11)
RDW: 15.7 % — ABNORMAL HIGH (ref 11.5–15.5)
WBC Count: 7 10*3/uL (ref 4.0–10.5)
nRBC: 0 % (ref 0.0–0.2)

## 2022-04-19 LAB — CMP (CANCER CENTER ONLY)
ALT: 10 U/L (ref 0–44)
AST: 13 U/L — ABNORMAL LOW (ref 15–41)
Albumin: 3.6 g/dL (ref 3.5–5.0)
Alkaline Phosphatase: 130 U/L — ABNORMAL HIGH (ref 38–126)
Anion gap: 7 (ref 5–15)
BUN: 10 mg/dL (ref 8–23)
CO2: 23 mmol/L (ref 22–32)
Calcium: 9.3 mg/dL (ref 8.9–10.3)
Chloride: 108 mmol/L (ref 98–111)
Creatinine: 0.56 mg/dL (ref 0.44–1.00)
GFR, Estimated: >60 mL/min (ref >60)
Glucose, Bld: 248 mg/dL — ABNORMAL HIGH (ref 70–99)
Potassium: 3.5 mmol/L (ref 3.5–5.1)
Sodium: 138 mmol/L (ref 135–145)
Total Bilirubin: 0.6 mg/dL (ref 0.3–1.2)
Total Protein: 6.4 g/dL — ABNORMAL LOW (ref 6.5–8.1)

## 2022-04-19 MED ORDER — SODIUM CHLORIDE 0.9 % IV SOLN: Freq: Once | INTRAVENOUS | Status: AC

## 2022-04-19 MED ORDER — SODIUM CHLORIDE 0.9 % IV SOLN
150.0000 mg | Freq: Once | INTRAVENOUS | Status: AC
  Administered 2022-04-19: 150 mg via INTRAVENOUS
  Filled 2022-04-19: qty 150

## 2022-04-19 MED ORDER — SODIUM CHLORIDE 0.9 % IV SOLN
10.0000 mg | Freq: Once | INTRAVENOUS | Status: AC
  Administered 2022-04-19: 10 mg via INTRAVENOUS
  Filled 2022-04-19: qty 10

## 2022-04-19 MED ORDER — PALONOSETRON HCL INJECTION 0.25 MG/5ML
0.2500 mg | Freq: Once | INTRAVENOUS | Status: AC
  Administered 2022-04-19: 0.25 mg via INTRAVENOUS
  Filled 2022-04-19: qty 5

## 2022-04-19 MED ORDER — SODIUM CHLORIDE 0.9% FLUSH
10.0000 mL | Freq: Once | INTRAVENOUS | Status: AC
  Administered 2022-04-19: 10 mL

## 2022-04-19 MED ORDER — PACLITAXEL PROTEIN-BOUND CHEMO INJECTION 100 MG
100.0000 mg/m2 | Freq: Once | INTRAVENOUS | Status: AC
  Administered 2022-04-19: 175 mg via INTRAVENOUS
  Filled 2022-04-19: qty 35

## 2022-04-19 MED ORDER — HEPARIN SOD (PORK) LOCK FLUSH 100 UNIT/ML IV SOLN
500.0000 [IU] | Freq: Once | INTRAVENOUS | Status: AC | PRN
  Administered 2022-04-19: 500 [IU]

## 2022-04-19 MED ORDER — SODIUM CHLORIDE 0.9 % IV SOLN
1000.0000 mg/m2 | Freq: Once | INTRAVENOUS | Status: AC
  Administered 2022-04-19: 1786 mg via INTRAVENOUS
  Filled 2022-04-19: qty 46.97

## 2022-04-19 MED ORDER — SODIUM CHLORIDE 0.9% FLUSH
10.0000 mL | INTRAVENOUS | Status: DC | PRN
  Administered 2022-04-19: 10 mL

## 2022-04-19 NOTE — Progress Notes (Signed)
Nutrition follow-up completed with patient during infusion for pancreatic cancer.  CURRENT THERAPY: Neoadjuvant Gemcitabine days 1 and 8 q21 days, starting 02/16/22; Abraxane added with C2 03/08/22  Weight documented as 144 pounds June 8, decreased from 159 pounds 13 oz on March 8. This is a 9% wt loss over 3 months which is significant.  Labs noted: Glucose 248  Per provider note: "The patient remembers having 1 bad day with nausea and vomiting but her husband reports she throws up "all day for 4 days after each treatment."  She is not able to eat or drink much.  She takes Zofran twice daily which is not very effective.  She eventually recovers well after that time.  Mirtazapine has helped her appetite but she has lost 4 pounds since last visit.  Bowels moving better on Senokot.  Abdominal pain is stable. Provider will escalate premeds to Aloxi, Decadron, and Emend.  She will continue Zofran and add Compazine at home."  Patient tells this RD she is having both taste and smell alterations which are limiting her oral intake.  She continues to drink Glucerna, 1 carton daily.  Nutrition diagnosis: Food and nutrition related knowledge deficit continues.  Intervention: Enforced importance of taking antiemetics as prescribed by physician. Educated patient on strategies for continuing smaller more frequent meals with snacks. Educated on strategies for improving taste and smell alterations. Continue Glucerna or equivalent at least 1 carton daily.  May need to change to Ensure Plus or equivalent to provide additional calories if weight continues to decrease. Provided coupons.  Questions were answered.  Contact information provided.  Monitoring, evaluation, goals: Patient will work to increase calories and protein to minimize further weight loss.  Next visit: Thursday, June 15 during infusion.  **Disclaimer: This note was dictated with voice recognition software. Similar sounding words can  inadvertently be transcribed and this note may contain transcription errors which may not have been corrected upon publication of note.**

## 2022-04-19 NOTE — Patient Instructions (Signed)
[facility] MEDICAL ONCOLOGY  Discharge Instructions: ?Thank you for choosing [facility] to provide your oncology and hematology care.  ? ?If you have a lab appointment with the Cancer Center, please go directly to the Cancer Center and check in at the registration area. ?  ?Wear comfortable clothing and clothing appropriate for easy access to any Portacath or PICC line.  ? ?We strive to give you quality time with your provider. You may need to reschedule your appointment if you arrive late (15 or more minutes).  Arriving late affects you and other patients whose appointments are after yours.  Also, if you miss three or more appointments without notifying the office, you may be dismissed from the clinic at the provider?s discretion.    ?  ?For prescription refill requests, have your pharmacy contact our office and allow 72 hours for refills to be completed.   ? ?Today you received the following chemotherapy and/or immunotherapy agents: Abraxane and Gemzar    ?  ?To help prevent nausea and vomiting after your treatment, we encourage you to take your nausea medication as directed. ? ?BELOW ARE SYMPTOMS THAT SHOULD BE REPORTED IMMEDIATELY: ?*FEVER GREATER THAN 100.4 F (38 ?C) OR HIGHER ?*CHILLS OR SWEATING ?*NAUSEA AND VOMITING THAT IS NOT CONTROLLED WITH YOUR NAUSEA MEDICATION ?*UNUSUAL SHORTNESS OF BREATH ?*UNUSUAL BRUISING OR BLEEDING ?*URINARY PROBLEMS (pain or burning when urinating, or frequent urination) ?*BOWEL PROBLEMS (unusual diarrhea, constipation, pain near the anus) ?TENDERNESS IN MOUTH AND THROAT WITH OR WITHOUT PRESENCE OF ULCERS (sore throat, sores in mouth, or a toothache) ?UNUSUAL RASH, SWELLING OR PAIN  ?UNUSUAL VAGINAL DISCHARGE OR ITCHING  ? ?Items with * indicate a potential emergency and should be followed up as soon as possible or go to the Emergency Department if any problems should occur. ? ?Please show the CHEMOTHERAPY ALERT CARD or IMMUNOTHERAPY ALERT CARD at  check-in to the Emergency Department and triage nurse. ? ?Should you have questions after your visit or need to cancel or reschedule your appointment, please contact [facility]COL[facility]  Dept: [phone]  and follow the prompts.  Office hours are 8:00 a.m. to 4:30 p.m. Monday - Friday. Please note that voicemails left after 4:00 p.m. may not be returned until the following business day.  We are closed weekends and major holidays. You have access to a nurse at all times for urgent questions. Please call the main number to the clinic Dept: [phone] and follow the prompts. ? ? ?For any non-urgent questions, you may also contact your provider using MyChart. We now offer e-Visits for anyone 18 and older to request care online for non-urgent symptoms. For details visit [email or web address]. ?  ?Also download the MyChart app! Go to the app store, search "MyChart", open the app, select [place], and log in with your MyChart username and password. ? ?Due to Covid, a mask is required upon entering the hospital/clinic. If you do not have a mask, one will be given to you upon arrival. For doctor visits, patients may have 1 support person aged 18 or older with them. For treatment visits, patients cannot have anyone with them due to current Covid guidelines and our immunocompromised population.  ? ?

## 2022-04-20 ENCOUNTER — Other Ambulatory Visit (HOSPITAL_BASED_OUTPATIENT_CLINIC_OR_DEPARTMENT_OTHER): Payer: Self-pay

## 2022-04-24 ENCOUNTER — Telehealth: Payer: Self-pay | Admitting: Hematology

## 2022-04-24 ENCOUNTER — Encounter: Payer: Self-pay | Admitting: Hematology

## 2022-04-24 NOTE — Telephone Encounter (Signed)
Scheduled follow-up appointment per 6/8 los. Patient is aware.

## 2022-04-24 NOTE — Addendum Note (Signed)
Addended by: Truitt Merle on: 04/24/2022 02:08 PM   Modules accepted: Orders

## 2022-04-25 ENCOUNTER — Other Ambulatory Visit: Payer: Self-pay | Admitting: Hematology

## 2022-04-26 ENCOUNTER — Inpatient Hospital Stay: Payer: Medicare HMO

## 2022-04-26 ENCOUNTER — Inpatient Hospital Stay: Payer: Medicare HMO | Admitting: Nutrition

## 2022-04-26 ENCOUNTER — Other Ambulatory Visit: Payer: Self-pay

## 2022-04-26 ENCOUNTER — Inpatient Hospital Stay (HOSPITAL_BASED_OUTPATIENT_CLINIC_OR_DEPARTMENT_OTHER): Payer: Medicare HMO | Admitting: Oncology

## 2022-04-26 VITALS — BP 160/87 | HR 68 | Temp 97.9°F | Resp 17 | Ht 63.0 in | Wt 143.4 lb

## 2022-04-26 DIAGNOSIS — C250 Malignant neoplasm of head of pancreas: Secondary | ICD-10-CM

## 2022-04-26 DIAGNOSIS — Z5111 Encounter for antineoplastic chemotherapy: Secondary | ICD-10-CM | POA: Diagnosis not present

## 2022-04-26 DIAGNOSIS — Z95828 Presence of other vascular implants and grafts: Secondary | ICD-10-CM

## 2022-04-26 LAB — CMP (CANCER CENTER ONLY)
ALT: 18 U/L (ref 0–44)
AST: 20 U/L (ref 15–41)
Albumin: 3.5 g/dL (ref 3.5–5.0)
Alkaline Phosphatase: 185 U/L — ABNORMAL HIGH (ref 38–126)
Anion gap: 5 (ref 5–15)
BUN: 11 mg/dL (ref 8–23)
CO2: 25 mmol/L (ref 22–32)
Calcium: 9.2 mg/dL (ref 8.9–10.3)
Chloride: 108 mmol/L (ref 98–111)
Creatinine: 0.48 mg/dL (ref 0.44–1.00)
GFR, Estimated: >60 mL/min (ref >60)
Glucose, Bld: 275 mg/dL — ABNORMAL HIGH (ref 70–99)
Potassium: 3.8 mmol/L (ref 3.5–5.1)
Sodium: 138 mmol/L (ref 135–145)
Total Bilirubin: 0.5 mg/dL (ref 0.3–1.2)
Total Protein: 6.5 g/dL (ref 6.5–8.1)

## 2022-04-26 LAB — CBC WITH DIFFERENTIAL (CANCER CENTER ONLY)
Abs Immature Granulocytes: 0.01 10*3/uL (ref 0.00–0.07)
Basophils Absolute: 0 10*3/uL (ref 0.0–0.1)
Basophils Relative: 1 %
Eosinophils Absolute: 0.1 10*3/uL (ref 0.0–0.5)
Eosinophils Relative: 1 %
HCT: 26.8 % — ABNORMAL LOW (ref 36.0–46.0)
Hemoglobin: 9.1 g/dL — ABNORMAL LOW (ref 12.0–15.0)
Immature Granulocytes: 0 %
Lymphocytes Relative: 64 %
Lymphs Abs: 2.4 10*3/uL (ref 0.7–4.0)
MCH: 31.6 pg (ref 26.0–34.0)
MCHC: 34 g/dL (ref 30.0–36.0)
MCV: 93.1 fL (ref 80.0–100.0)
Monocytes Absolute: 0.1 10*3/uL (ref 0.1–1.0)
Monocytes Relative: 3 %
Neutro Abs: 1.2 10*3/uL — ABNORMAL LOW (ref 1.7–7.7)
Neutrophils Relative %: 31 %
Platelet Count: 171 10*3/uL (ref 150–400)
RBC: 2.88 MIL/uL — ABNORMAL LOW (ref 3.87–5.11)
RDW: 15.8 % — ABNORMAL HIGH (ref 11.5–15.5)
WBC Count: 3.8 10*3/uL — ABNORMAL LOW (ref 4.0–10.5)
nRBC: 0 % (ref 0.0–0.2)

## 2022-04-26 MED ORDER — SODIUM CHLORIDE 0.9 % IV SOLN: Freq: Once | INTRAVENOUS | Status: AC

## 2022-04-26 MED ORDER — SODIUM CHLORIDE 0.9% FLUSH
10.0000 mL | Freq: Once | INTRAVENOUS | Status: AC
  Administered 2022-04-26: 10 mL

## 2022-04-26 MED ORDER — SODIUM CHLORIDE 0.9 % IV SOLN
1000.0000 mg/m2 | Freq: Once | INTRAVENOUS | Status: AC
  Administered 2022-04-26: 1786 mg via INTRAVENOUS
  Filled 2022-04-26: qty 46.97

## 2022-04-26 MED ORDER — SODIUM CHLORIDE 0.9 % IV SOLN
10.0000 mg | Freq: Once | INTRAVENOUS | Status: AC
  Administered 2022-04-26: 10 mg via INTRAVENOUS
  Filled 2022-04-26: qty 10

## 2022-04-26 MED ORDER — SODIUM CHLORIDE 0.9 % IV SOLN
150.0000 mg | Freq: Once | INTRAVENOUS | Status: AC
  Administered 2022-04-26: 150 mg via INTRAVENOUS
  Filled 2022-04-26: qty 150

## 2022-04-26 MED ORDER — HEPARIN SOD (PORK) LOCK FLUSH 100 UNIT/ML IV SOLN
500.0000 [IU] | Freq: Once | INTRAVENOUS | Status: AC | PRN
  Administered 2022-04-26: 500 [IU]

## 2022-04-26 MED ORDER — PACLITAXEL PROTEIN-BOUND CHEMO INJECTION 100 MG
100.0000 mg/m2 | Freq: Once | INTRAVENOUS | Status: AC
  Administered 2022-04-26: 175 mg via INTRAVENOUS
  Filled 2022-04-26: qty 35

## 2022-04-26 MED ORDER — PALONOSETRON HCL INJECTION 0.25 MG/5ML
0.2500 mg | Freq: Once | INTRAVENOUS | Status: AC
  Administered 2022-04-26: 0.25 mg via INTRAVENOUS
  Filled 2022-04-26: qty 5

## 2022-04-26 MED ORDER — SODIUM CHLORIDE 0.9% FLUSH
10.0000 mL | INTRAVENOUS | Status: DC | PRN
  Administered 2022-04-26: 10 mL

## 2022-04-26 NOTE — Progress Notes (Unsigned)
Per Dr. Burr Medico, okay for patient to receive treatment today with [place] 1.2.

## 2022-04-26 NOTE — Progress Notes (Signed)
Nutrition follow-up completed with patient during infusion for pancreas cancer.  Weight relatively stable and was documented as 143 pounds 6.4 ounces today.  This is down slightly from 144 pounds June 8.  Labs noted: Glucose 275.  Patient reports she is eating better.  States she does take her nausea medications.  She denies other nutrition impact symptoms.  She has been drinking Glucerna, 1 carton daily but would like to try Ensure complete.  Nutrition diagnosis:  Food and nutrition related knowledge deficit improved.  Intervention: Continue taking antiemetics as prescribed. Continue small, frequent meals and snacks with high-calorie, high-protein foods. Provided 1 carton of Ensure complete for patient to try.  This will provide 350 cal and 30 g of protein. Recommend MD adjust diabetes medications if blood sugars are not well controlled.  Monitoring, evaluation, goals: Patient will tolerate adequate calories and protein for weight maintenance.  Next visit: Thursday, June 29 during infusion.  **Disclaimer: This note was dictated with voice recognition software. Similar sounding words can inadvertently be transcribed and this note may contain transcription errors which may not have been corrected upon publication of note.**

## 2022-04-26 NOTE — Addendum Note (Signed)
Addended by: Faythe Casa E on: 04/26/2022 03:12 PM   Modules accepted: Orders

## 2022-04-26 NOTE — Progress Notes (Signed)
[place]   [phone] [phone]   Clinic Follow up Note   Patient Care Team: [name], MD as PCP - General (Internal Medicine) Truitt Merle, MD as Consulting Physician (Oncology) Royston Bake, RN as Oncology Nurse Navigator (Oncology) 04/26/2022  CHIEF COMPLAINT: Follow up pancreatic cancer   SUMMARY OF ONCOLOGIC HISTORY: Oncology History Overview Note   Cancer Staging  Pancreatic cancer [facility]) Staging form: Exocrine Pancreas, AJCC 8th Edition - Clinical stage from 01/18/2022: Stage IB (cT2, cN0, cM0) - Signed by Truitt Merle, MD on 01/28/2022    Pancreatic cancer ([place])  01/16/2022 Imaging   CLINICAL DATA:  Jaundice   EXAM: CT ABDOMEN AND PELVIS WITH CONTRAST  IMPRESSION: 1. Marked intra and extrahepatic biliary dilatation with marked dilatation of pancreatic duct and distended gallbladder. There is significant atrophy of the body and tail of pancreas. 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 2. Diverticular disease of the colon without acute wall thickening 3. Mild endometrial thickening up to 9 mm, recommend nonemergent pelvic ultrasound for further evaluation   01/17/2022 Imaging   EXAM: MRI ABDOMEN WITHOUT AND WITH CONTRAST (INCLUDING MRCP)  IMPRESSION: 1. Obstructing mass within the pancreatic head with an intraluminal component demonstrating low level enhancement. 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 2. This mass causes significant extrinsic narrowing at the portal/superior mesenteric vein confluence as well as marked intra and extrahepatic biliary dilatation. 3. No distant metastases identified. 4. Endoscopy for  endoscopic ultrasound, tissue sampling and biliary decompression recommended.   01/18/2022 Cancer Staging   Staging form: Exocrine Pancreas, AJCC 8th Edition - Clinical stage from 01/18/2022: Stage IB (cT2, cN0, cM0) - Signed by Truitt Merle, MD on 01/28/2022 Stage prefix: Initial diagnosis Total positive nodes: 0   01/18/2022 Initial Biopsy   FINAL MICROSCOPIC DIAGNOSIS:  A. PANCREAS, HEAD, FINE NEEDLE ASPIRATION:  - Malignant cells present consistent with adenocarcinoma   01/24/2022 Imaging   EXAM: CT CHEST WITHOUT CONTRAST  IMPRESSION: 1. No evidence of metastatic disease in the chest. 2. Atelectasis or consolidation in the right lower lung. 3. Healing right rib fractures. 4. Mass in the head of the pancreas. See previous report of CT abdomen and pelvis 01/16/2022. Interval placement of the biliary stent.   01/28/2022 Initial Diagnosis   Pancreatic cancer ([place])   02/16/2022 -  Chemotherapy   Patient is on Treatment Plan : PANCREATIC Abraxane / Gemcitabine D1,8 q21d     02/16/2022 Genetic Testing   Negative genetic testing on the CancerNext-Expanded+RNAinsight panel.  The report date is February 16, 2022.  The CancerNext-Expanded gene panel offered by [facility] and includes sequencing and rearrangement analysis for the following 77 genes: AIP, ALK, APC*, ATM*, AXIN2, BAP1, BARD1, BLM, BMPR1A, BRCA1*, BRCA2*, BRIP1*, CDC73, CDH1*, CDK4, CDKN1B, CDKN2A, CHEK2*, CTNNA1, DICER1, FANCC, FH, FLCN, GALNT12, KIF1B, LZTR1, MAX, MEN1, MET, MLH1*, MSH2*, MSH3, MSH6*, MUTYH*, NBN, NF1*, NF2, NTHL1, PALB2*, PHOX2B, PMS2*, POT1, PRKAR1A, PTCH1, PTEN*, RAD51C*, RAD51D*, RB1, RECQL, RET, SDHA, SDHAF2, SDHB, SDHC, SDHD, SMAD4, SMARCA4, SMARCB1, SMARCE1, STK11, SUFU, TMEM127, TP53*, TSC1, TSC2, VHL and XRCC2 (sequencing and deletion/duplication); EGFR, EGLN1, HOXB13, KIT, MITF, PDGFRA, POLD1, and POLE (sequencing only); EPCAM and GREM1 (deletion/duplication only). DNA and RNA analyses performed for * genes.  CURRENT THERAPY: Neoadjuvant Gemcitabine days 1 and 8 q21 days, starting 02/16/22; Abraxane added with C2 03/08/22  INTERVAL HISTORY: Miranda Francis returns for follow up and treatment as scheduled.  She was last seen in clinic on 04/19/2022 by Garry Heater, NP for cycle 4-day 1 of gemcitabine and Abraxane.  Nausea and vomiting has tremendously improved since her last treatment with her new premeds.  Denies any additional nausea or vomiting following last treatment.  Appetite has picked up and feels she is drinking enough fluids.  Had some constipation for 2 to 3 days but had a good bowel movement this morning.  She is using MiraLAX, Senokot and Colace as needed.  Abdominal pain is stable occurring mostly at nighttime which is resolved with oxycodone.  No fevers, shortness of breath or chest pain.  Review of Systems  Constitutional:  Positive for malaise/fatigue and weight loss.  Gastrointestinal:  Positive for abdominal pain and constipation.  All other systems reviewed and are negative.   All other systems were reviewed with the patient and are negative.  MEDICAL HISTORY:  Past Medical History:  Diagnosis Date   Blood transfusion    Diabetes mellitus    Family history of breast cancer    Family history of colon cancer    Family history of prostate cancer    Heart murmur    Hypertension     SURGICAL HISTORY: Past Surgical History:  Procedure Laterality Date   BILIARY STENT PLACEMENT  01/18/2022   Procedure: BILIARY STENT PLACEMENT;  Surgeon: Carol Ada, MD;  Location: [place];  Service: Gastroenterology;;   DILATION AND CURETTAGE OF UTERUS     ERCP N/A 01/18/2022   Procedure: ENDOSCOPIC RETROGRADE CHOLANGIOPANCREATOGRAPHY (ERCP);  Surgeon: Carol Ada, MD;  Location: [place];  Service: Gastroenterology;  Laterality: N/A;   ESOPHAGOGASTRODUODENOSCOPY N/A 01/18/2022   Procedure: ESOPHAGOGASTRODUODENOSCOPY (EGD);  Surgeon: Carol Ada, MD;  Location: [place];  Service:  Gastroenterology;  Laterality: N/A;   EUS  01/18/2022   Procedure: UPPER ENDOSCOPIC ULTRASOUND (EUS) LINEAR;  Surgeon: Carol Ada, MD;  Location: [facility] ENDOSCOPY;  Service: Gastroenterology;;   FINE NEEDLE ASPIRATION  01/18/2022   Procedure: FINE NEEDLE ASPIRATION (FNA) LINEAR;  Surgeon: Carol Ada, MD;  Location: [facility] ENDOSCOPY;  Service: Gastroenterology;;   IR IMAGING GUIDED PORT INSERTION  02/15/2022   NO PAST SURGERIES     SPHINCTEROTOMY  01/18/2022   Procedure: Joan Mayans;  Surgeon: Carol Ada, MD;  Location: [place];  Service: Gastroenterology;;    I have reviewed the social history and family history with the patient and they are unchanged from previous note.  ALLERGIES:  has No Known Allergies.  MEDICATIONS:  Current Outpatient Medications  Medication Sig Dispense Refill   acetaminophen (TYLENOL) 500 MG tablet Take 1,000 mg by mouth every 6 (six) hours as needed for moderate pain or headache.     amLODipine (NORVASC) 5 MG tablet Take 5 mg by mouth daily.     Cholecalciferol (QC VITAMIN D3) 50 MCG (2000 UT) TABS Take 2,000 Units by mouth daily.     cholestyramine light (PREVALITE) 4 g packet Take 1 packet (4 g total) by mouth 2 (two) times daily. 60 packet 0   dronabinol (MARINOL) 5 MG capsule Take 5 mg by mouth 2 (two) times daily before a meal.     glipiZIDE (GLUCOTROL) 5 MG tablet Take 2.5 mg by mouth 2 (two) times daily with a meal.     KLOR-CON M20 20 MEQ tablet Take 20 mEq by mouth 2 (two)  times daily.     lidocaine-prilocaine (EMLA) cream Apply to affected area once 30 g 3   metFORMIN (GLUCOPHAGE) 500 MG tablet Take by mouth daily with breakfast.     mirtazapine (REMERON) 7.5 MG tablet TAKE 1 TABLET BY MOUTH AT BEDTIME. 90 tablet 1   ondansetron (ZOFRAN) 8 MG tablet Take 1 tablet (8 mg total) by mouth 2 (two) times daily as needed (Nausea or vomiting). 30 tablet 1   oxyCODONE (OXY IR/ROXICODONE) 5 MG immediate release tablet Take 1 tablet (5 mg total) by mouth every 3  (three) hours as needed for severe pain. 20 tablet 0   polyethylene glycol (MIRALAX / GLYCOLAX) 17 g packet Take 17 g by mouth daily. 14 each 0   prochlorperazine (COMPAZINE) 10 MG tablet Take 1 tablet (10 mg total) by mouth every 6 (six) hours as needed (Nausea or vomiting). 30 tablet 1   No current facility-administered medications for this visit.    PHYSICAL EXAMINATION: ECOG PERFORMANCE STATUS: 1 - Symptomatic but completely ambulatory  Vitals:   04/26/22 1238  BP: (!) 160/87  Pulse: 68  Resp: 17  Temp: 97.9 F (36.6 C)  SpO2: 99%   Filed Weights   04/26/22 1238  Weight: 143 lb 6.4 oz (65 kg)    Physical Exam Constitutional:      Appearance: Normal appearance.  Cardiovascular:     Rate and Rhythm: Normal rate and regular rhythm.  Pulmonary:     Effort: Pulmonary effort is normal.     Breath sounds: Normal breath sounds.  Abdominal:     General: Bowel sounds are normal.     Palpations: Abdomen is soft.  Musculoskeletal:        General: No swelling. Normal range of motion.  Neurological:     Mental Status: She is alert and oriented to person, place, and time. Mental status is at baseline.     LABORATORY DATA:  I have reviewed the data as listed    Latest Ref Rng & Units 04/26/2022   12:14 PM 04/19/2022    9:01 AM 04/05/2022    1:27 PM  CBC  WBC 4.0 - 10.5 K/uL 3.8  7.0  4.2   Hemoglobin 12.0 - 15.0 g/dL 9.1  10.0  9.7   Hematocrit 36.0 - 46.0 % 26.8  29.7  28.4   Platelets 150 - 400 K/uL 171  236  242         Latest Ref Rng & Units 04/26/2022   12:14 PM 04/19/2022    9:01 AM 04/05/2022    1:27 PM  CMP  Glucose 70 - 99 mg/dL 275  248  199   BUN 8 - 23 mg/dL $Remove'11  10  7   'xqovaPu$ Creatinine 0.44 - 1.00 mg/dL 0.48  0.56  0.54   Sodium 135 - 145 mmol/L 138  138  135   Potassium 3.5 - 5.1 mmol/L 3.8  3.5  3.8   Chloride 98 - 111 mmol/L 108  108  104   CO2 22 - 32 mmol/L $RemoveB'25  23  25   'KvRlFhIe$ Calcium 8.9 - 10.3 mg/dL 9.2  9.3  9.2   Total Protein 6.5 - 8.1 g/dL 6.5  6.4  6.9    Total Bilirubin 0.3 - 1.2 mg/dL 0.5  0.6  0.9   Alkaline Phos 38 - 126 U/L 185  130  149   AST 15 - 41 U/L $Remo'20  13  14   'XydTv$ ALT 0 - 44 U/L 18  10  11       RADIOGRAPHIC STUDIES: I have personally reviewed the radiological images as listed and agreed with the findings in the report. No results found.   ASSESSMENT & PLAN: Miranda Francis is a 76 y.o. female with    1. Pancreatic Cancer, stage IB c(T2, N0, M0), borderline resectable, MMR normal   -presented to PCP with jaundice for one month. Also complained of epigastric pain, poor appetite, weight loss, and light-colored stool. Found to have elevated liver enzymes and referred to ED on 01/16/22. CMP in ED showed tbili at 39.3. CT AP showed: marked biliary dilatation; soft tissue mass at pancreatic head with marked narrowing of SMV; diverticular disease of colon.  No distant metastasis on CT scan. -baseline CA 19-9 on 01/16/22 was elevated at 9,561. -MRCP abdomen MRI on 01/17/22 showed: obstructing mass within pancreatic head with low-level enhancement, causing significant extrinsic narrowing at SMV and dilatation. -ERCP on 01/18/22 by Dr. Benson Norway, FNA of pancreas head mass showed malignant cells consistent with adenocarcinoma. MMR normal. -She was seen by Dr. Michaelle Birks for felt her to be borderline resectable; she agreed with neoadjuvant chemo for 6-8 cycles then reassess -she began neoadjuvant gemcitabine alone on 02/16/22, abraxane was added with C2 (03/08/22).  She has tolerated well except moderate fatigue -her CA 19-9 initially increased > 15K but is currently trending down on treatment (12K then 5K) -Miranda Francis appears stable today.  Premeds were changed to dexamethasone, Emend and Aloxi due to nausea and vomiting after her last cycle of chemo.  She was instructed to use her Zofran and was given a prescription for Compazine.  She denies any additional nausea and vomiting post her last chemo treatment.  She has been eating better.  Continues to  have abdominal pain later in the day and takes oxycodone which helps with her pain and sleep.  Had some concern for constipation but had a good bowel movement this morning.  Uses MiraLAX daily.   2. Symptom Management: decreased appetite with weight loss, abdominal discomfort -Reports a ~40 lb weight loss over the last 4 months or so. Trying ensure, boost, and glucerna -Has follow-up with dietitian intermittently. -marinol prescribed by PCP was not helpful, mirtazapine works better -Oral intake has improved since changing premeds and adding Compazine and Zofran to prescriptions. -Weight is down 1 pound since last week.  3. Obstructive jaundice -presented with hyperbilirubinemia at 39.3 -s/p stent placement on 01/18/22 -tbili improving, down to 1.5 on 03/15/22, normalized   4. Anemia -hgb dropped to 6.4 by 01/25/22 while in the hospital. She received blood transfusion that day, and hgb recovered to 8.4 the same day. -Hemoglobin 9.1 today -Stable continue to monitor.   5. Genetic Testing -she reports colon cancer in a brother (age 51) and a sister (age 68). -she proceeded with testing on 02/07/22. Results were negative.  6.  Constipation -Continue MiraLAX, Senokot and Colace as needed. -Do not recommend suppositories at this time given low white count.  7.  Neutropenia -ANC 1200 today. -Discussed neutropenic precautions. -Proceed with treatment.  8.  Hyperglycemia -Currently on glipizide and metformin for diabetes. -Advised that she reach out to her PCP regarding elevated blood sugars -Receives steroids with premedications which are certainly contributing. -Continue to check blood sugars at home.   PLAN: -Reviewed labs with patient and husband.  Adequate for treatment.  Proceed with cycle 4-day 8 gemcitabine and Abraxane.  -Continue premeds with Aloxi, Decadron and Emend -Continue Zofran and Compazine at home. -Check blood sugars  at home and get in touch with PCP regarding elevated  levels. -Keep scheduled follow-up for cycle 5-day 1 with Dr. Burr Medico on 05/10/2022.  I spent 25 minutes dedicated to the care of this patient (face-to-face and non-face-to-face) on the date of the encounter to include what is described in the assessment and plan.     Jacquelin Hawking, NP 04/26/22

## 2022-04-26 NOTE — Patient Instructions (Signed)
[facility]COL[facility]  Discharge Instructions: Thank you for choosing [facility] to provide your oncology and hematology care.   If you have a lab appointment with the [facility], please go directly to the [facility] and check in at the registration area.   Wear comfortable clothing and clothing appropriate for easy access to any Portacath or PICC line.   We strive to give you quality time with your provider. You may need to reschedule your appointment if you arrive late (15 or more minutes).  Arriving late affects you and other patients whose appointments are after yours.  Also, if you miss three or more appointments without notifying the office, you may be dismissed from the clinic at the provider's discretion.      For prescription refill requests, have your pharmacy contact our office and allow 72 hours for refills to be completed.    Today you received the following chemotherapy and/or immunotherapy agents abraxane, gemcitabine      To help prevent nausea and vomiting after your treatment, we encourage you to take your nausea medication as directed.  BELOW ARE SYMPTOMS THAT SHOULD BE REPORTED IMMEDIATELY: *FEVER GREATER THAN 100.4 F (38 C) OR HIGHER *CHILLS OR SWEATING *NAUSEA AND VOMITING THAT IS NOT CONTROLLED WITH YOUR NAUSEA MEDICATION *UNUSUAL SHORTNESS OF BREATH *UNUSUAL BRUISING OR BLEEDING *URINARY PROBLEMS (pain or burning when urinating, or frequent urination) *BOWEL PROBLEMS (unusual diarrhea, constipation, pain near the anus) TENDERNESS IN MOUTH AND THROAT WITH OR WITHOUT PRESENCE OF ULCERS (sore throat, sores in mouth, or a toothache) UNUSUAL RASH, SWELLING OR PAIN  UNUSUAL VAGINAL DISCHARGE OR ITCHING   Items with * indicate a potential emergency and should be followed up as soon as possible or go to the Emergency Department if any problems should occur.  Please show the CHEMOTHERAPY ALERT CARD or IMMUNOTHERAPY ALERT CARD at  check-in to the Emergency Department and triage nurse.  Should you have questions after your visit or need to cancel or reschedule your appointment, please contact [facility]  Dept: [phone]  and follow the prompts.  Office hours are 8:00 a.m. to 4:30 p.m. Monday - Friday. Please note that voicemails left after 4:00 p.m. may not be returned until the following business day.  We are closed weekends and major holidays. You have access to a nurse at all times for urgent questions. Please call the main number to the clinic Dept: [phone] and follow the prompts.   For any non-urgent questions, you may also contact your provider using MyChart. We now offer e-Visits for anyone 75 and older to request care online for non-urgent symptoms. For details visit [email or web address].   Also download the MyChart app! Go to the app store, search "MyChart", open the app, select [facility], and log in with your MyChart username and password.  Masks are optional in the cancer centers. If you would like for your care team to wear a mask while they are taking care of you, please let them know. For doctor visits, patients may have with them one support person who is at least 75 years old. At this time, visitors are not allowed in the infusion area.

## 2022-04-27 LAB — CANCER ANTIGEN 19-9: CA 19-9: 3801 U/mL — ABNORMAL HIGH (ref 0–35)

## 2022-05-08 ENCOUNTER — Encounter (HOSPITAL_COMMUNITY): Payer: Self-pay

## 2022-05-08 ENCOUNTER — Ambulatory Visit (HOSPITAL_COMMUNITY)
Admission: RE | Admit: 2022-05-08 | Discharge: 2022-05-08 | Disposition: A | Discharge status: Home / Self Care | Payer: Medicare HMO | Source: Ambulatory Visit | Attending: Oncology | Admitting: Oncology

## 2022-05-08 DIAGNOSIS — C250 Malignant neoplasm of head of pancreas: Secondary | ICD-10-CM | POA: Insufficient documentation

## 2022-05-08 MED ORDER — SODIUM CHLORIDE (PF) 0.9 % IJ SOLN
INTRAMUSCULAR | Status: AC
  Filled 2022-05-08: qty 50

## 2022-05-08 MED ORDER — IOHEXOL 350 MG/ML SOLN
100.0000 mL | Freq: Once | INTRAVENOUS | Status: AC | PRN
  Administered 2022-05-08: 100 mL via INTRAVENOUS

## 2022-05-09 MED FILL — Fosaprepitant Dimeglumine For IV Infusion 150 MG (Base Eq): INTRAVENOUS | Qty: 5 | Status: AC

## 2022-05-09 MED FILL — Dexamethasone Sodium Phosphate Inj 100 MG/10ML: INTRAMUSCULAR | Qty: 1 | Status: AC

## 2022-05-10 ENCOUNTER — Inpatient Hospital Stay: Payer: Medicare HMO | Admitting: Nutrition

## 2022-05-10 ENCOUNTER — Other Ambulatory Visit: Payer: Self-pay

## 2022-05-10 ENCOUNTER — Inpatient Hospital Stay: Payer: Medicare HMO

## 2022-05-10 ENCOUNTER — Encounter: Payer: Self-pay | Admitting: Hematology

## 2022-05-10 ENCOUNTER — Inpatient Hospital Stay (HOSPITAL_BASED_OUTPATIENT_CLINIC_OR_DEPARTMENT_OTHER): Payer: Medicare HMO | Admitting: Hematology

## 2022-05-10 VITALS — BP 146/83 | HR 71 | Temp 98.7°F | Resp 18 | Ht 63.0 in | Wt 144.0 lb

## 2022-05-10 DIAGNOSIS — C250 Malignant neoplasm of head of pancreas: Secondary | ICD-10-CM | POA: Diagnosis not present

## 2022-05-10 DIAGNOSIS — Z5111 Encounter for antineoplastic chemotherapy: Secondary | ICD-10-CM | POA: Diagnosis not present

## 2022-05-10 DIAGNOSIS — Z95828 Presence of other vascular implants and grafts: Secondary | ICD-10-CM

## 2022-05-10 LAB — CBC WITH DIFFERENTIAL (CANCER CENTER ONLY)
Abs Immature Granulocytes: 0.02 10*3/uL (ref 0.00–0.07)
Basophils Absolute: 0 10*3/uL (ref 0.0–0.1)
Basophils Relative: 0 %
Eosinophils Absolute: 0.2 10*3/uL (ref 0.0–0.5)
Eosinophils Relative: 2 %
HCT: 27.5 % — ABNORMAL LOW (ref 36.0–46.0)
Hemoglobin: 9.2 g/dL — ABNORMAL LOW (ref 12.0–15.0)
Immature Granulocytes: 0 %
Lymphocytes Relative: 41 %
Lymphs Abs: 2.9 10*3/uL (ref 0.7–4.0)
MCH: 31.3 pg (ref 26.0–34.0)
MCHC: 33.5 g/dL (ref 30.0–36.0)
MCV: 93.5 fL (ref 80.0–100.0)
Monocytes Absolute: 0.6 10*3/uL (ref 0.1–1.0)
Monocytes Relative: 8 %
Neutro Abs: 3.4 10*3/uL (ref 1.7–7.7)
Neutrophils Relative %: 49 %
Platelet Count: 261 10*3/uL (ref 150–400)
RBC: 2.94 MIL/uL — ABNORMAL LOW (ref 3.87–5.11)
RDW: 17.6 % — ABNORMAL HIGH (ref 11.5–15.5)
WBC Count: 7 10*3/uL (ref 4.0–10.5)
nRBC: 0 % (ref 0.0–0.2)

## 2022-05-10 LAB — CMP (CANCER CENTER ONLY)
ALT: 17 U/L (ref 0–44)
AST: 17 U/L (ref 15–41)
Albumin: 3.5 g/dL (ref 3.5–5.0)
Alkaline Phosphatase: 157 U/L — ABNORMAL HIGH (ref 38–126)
Anion gap: 5 (ref 5–15)
BUN: 10 mg/dL (ref 8–23)
CO2: 26 mmol/L (ref 22–32)
Calcium: 9 mg/dL (ref 8.9–10.3)
Chloride: 108 mmol/L (ref 98–111)
Creatinine: 0.49 mg/dL (ref 0.44–1.00)
GFR, Estimated: >60 mL/min (ref >60)
Glucose, Bld: 171 mg/dL — ABNORMAL HIGH (ref 70–99)
Potassium: 3.9 mmol/L (ref 3.5–5.1)
Sodium: 139 mmol/L (ref 135–145)
Total Bilirubin: 0.4 mg/dL (ref 0.3–1.2)
Total Protein: 6.1 g/dL — ABNORMAL LOW (ref 6.5–8.1)

## 2022-05-10 MED ORDER — SODIUM CHLORIDE 0.9% FLUSH
10.0000 mL | Freq: Once | INTRAVENOUS | Status: AC
  Administered 2022-05-10: 10 mL

## 2022-05-10 MED ORDER — PACLITAXEL PROTEIN-BOUND CHEMO INJECTION 100 MG
100.0000 mg/m2 | Freq: Once | INTRAVENOUS | Status: AC
  Administered 2022-05-10: 175 mg via INTRAVENOUS
  Filled 2022-05-10: qty 35

## 2022-05-10 MED ORDER — SODIUM CHLORIDE 0.9 % IV SOLN
1000.0000 mg/m2 | Freq: Once | INTRAVENOUS | Status: AC
  Administered 2022-05-10: 1786 mg via INTRAVENOUS
  Filled 2022-05-10: qty 46.97

## 2022-05-10 MED ORDER — HEPARIN SOD (PORK) LOCK FLUSH 100 UNIT/ML IV SOLN
500.0000 [IU] | Freq: Once | INTRAVENOUS | Status: AC | PRN
  Administered 2022-05-10: 500 [IU]

## 2022-05-10 MED ORDER — SODIUM CHLORIDE 0.9 % IV SOLN
10.0000 mg | Freq: Once | INTRAVENOUS | Status: AC
  Administered 2022-05-10: 10 mg via INTRAVENOUS
  Filled 2022-05-10: qty 10

## 2022-05-10 MED ORDER — SODIUM CHLORIDE 0.9 % IV SOLN
150.0000 mg | Freq: Once | INTRAVENOUS | Status: AC
  Administered 2022-05-10: 150 mg via INTRAVENOUS
  Filled 2022-05-10: qty 150

## 2022-05-10 MED ORDER — SODIUM CHLORIDE 0.9% FLUSH
10.0000 mL | INTRAVENOUS | Status: DC | PRN
  Administered 2022-05-10: 10 mL

## 2022-05-10 MED ORDER — PALONOSETRON HCL INJECTION 0.25 MG/5ML
0.2500 mg | Freq: Once | INTRAVENOUS | Status: AC
  Administered 2022-05-10: 0.25 mg via INTRAVENOUS
  Filled 2022-05-10: qty 5

## 2022-05-10 MED ORDER — SODIUM CHLORIDE 0.9 % IV SOLN: Freq: Once | INTRAVENOUS | Status: AC

## 2022-05-10 NOTE — Patient Instructions (Signed)
[facility]  Discharge Instructions: Thank you for choosing [facility] to provide your oncology and hematology care.   If you have a lab appointment with the [facility], please go directly to the [facility] and check in at the registration area.   Wear comfortable clothing and clothing appropriate for easy access to any Portacath or PICC line.   We strive to give you quality time with your provider. You may need to reschedule your appointment if you arrive late (15 or more minutes).  Arriving late affects you and other patients whose appointments are after yours.  Also, if you miss three or more appointments without notifying the office, you may be dismissed from the clinic at the provider's discretion.      For prescription refill requests, have your pharmacy contact our office and allow 72 hours for refills to be completed.    Today you received the following chemotherapy and/or immunotherapy agents: Abraxane/Gemzar      To help prevent nausea and vomiting after your treatment, we encourage you to take your nausea medication as directed.  BELOW ARE SYMPTOMS THAT SHOULD BE REPORTED IMMEDIATELY: *FEVER GREATER THAN 100.4 F (38 C) OR HIGHER *CHILLS OR SWEATING *NAUSEA AND VOMITING THAT IS NOT CONTROLLED WITH YOUR NAUSEA MEDICATION *UNUSUAL SHORTNESS OF BREATH *UNUSUAL BRUISING OR BLEEDING *URINARY PROBLEMS (pain or burning when urinating, or frequent urination) *BOWEL PROBLEMS (unusual diarrhea, constipation, pain near the anus) TENDERNESS IN MOUTH AND THROAT WITH OR WITHOUT PRESENCE OF ULCERS (sore throat, sores in mouth, or a toothache) UNUSUAL RASH, SWELLING OR PAIN  UNUSUAL VAGINAL DISCHARGE OR ITCHING   Items with * indicate a potential emergency and should be followed up as soon as possible or go to the Emergency Department if any problems should occur.  Please show the CHEMOTHERAPY ALERT CARD or IMMUNOTHERAPY ALERT CARD at  check-in to the Emergency Department and triage nurse.  Should you have questions after your visit or need to cancel or reschedule your appointment, please contact [facility]  Dept: [phone]  and follow the prompts.  Office hours are 8:00 a.m. to 4:30 p.m. Monday - Friday. Please note that voicemails left after 4:00 p.m. may not be returned until the following business day.  We are closed weekends and major holidays. You have access to a nurse at all times for urgent questions. Please call the main number to the clinic Dept: [phone] and follow the prompts.   For any non-urgent questions, you may also contact your provider using MyChart. We now offer e-Visits for anyone 64 and older to request care online for non-urgent symptoms. For details visit [email or web address].   Also download the MyChart app! Go to the app store, search "MyChart", open the app, select [facility], and log in with your MyChart username and password.  Masks are optional in the cancer centers. If you would like for your care team to wear a mask while they are taking care of you, please let them know. For doctor visits, patients may have with them one support person who is at least 75 years old. At this time, visitors are not allowed in the infusion area.

## 2022-05-10 NOTE — Progress Notes (Signed)
OK to treat today with symptoms- jaw tremor and hearing issues. Aware of constipation- med maintenance

## 2022-05-10 NOTE — Progress Notes (Signed)
Brief nutrition follow-up completed with patient during infusion for pancreas cancer. Weight documented as 144 pounds today increased slightly from 143 pounds 6.4 ounces June 15. Noted glucose has decreased to 171 after patient has started on insulin.  Patient reports she feels better and her appetite has returned.  States she is so happy that she is able to eat.  She is not sure what happened but she is very happy about it.  She has no concerns today.  Nutrition diagnosis: Food and nutrition related knowledge deficit resolved.  Patient agrees to contact this RD with questions or concerns.  I will monitor weight and I am available to see patient as needed.  **Disclaimer: This note was dictated with voice recognition software. Similar sounding words can inadvertently be transcribed and this note may contain transcription errors which may not have been corrected upon publication of note.**

## 2022-05-10 NOTE — Progress Notes (Signed)
Miranda Francis   [phone] [phone]   Clinic Follow up Note   Patient Care Team: Miranda Spanish, MD as PCP - General (Internal Medicine) Truitt Merle, MD as Consulting Physician (Oncology) Miranda Bake, RN as Oncology Nurse Navigator (Oncology)  Date of Service:  05/10/2022  CHIEF COMPLAINT: f/u of pancreatic cancer  CURRENT THERAPY:  Neoadjuvant Gemcitabine, days 1 and 8 q21d, starting 02/16/22 -Abraxane added with C2 (03/08/22)  ASSESSMENT & PLAN:  Miranda Francis is a 76 y.o. female with   1. Pancreatic Cancer, stage IB c(T2, N0, M0), borderline resectable, MMR normal   -presented to PCP with jaundice for one month. Also complained of epigastric pain, poor appetite, weight loss, and light-colored stool. Found to have elevated liver enzymes and referred to ED on 01/16/22. CMP in ED showed tbili at 39.3. CT AP showed: marked biliary dilatation; soft tissue mass at pancreatic head with marked narrowing of SMV; diverticular disease of colon.  No distant metastasis on CT scan. -baseline CA 19-9 on 01/16/22 was elevated at 9,561. -MRCP abdomen MRI on 01/17/22 showed: obstructing mass within pancreatic head with low-level enhancement, causing significant extrinsic narrowing at SMV and dilatation. -ERCP on 01/18/22 by Dr. Benson Norway, FNA of pancreas head mass showed malignant cells consistent with adenocarcinoma. MMR normal. -she began neoadjuvant gemcitabine alone on 02/16/22, abraxane was added with C2 (03/08/22).  She is tolerating well overall, but still has moderate fatigue, not physically active.  -her CA 19-9 has dropped significantly since she started chemo treatment, most recently 3,801 on 04/26/22. -restaging CT AP on 05/08/22 showed: positive response to treatment to pancreatic head and neck; persistent extrinsic mass effect; possible new small right liver lesions. I reviewed the results with them today.  I recommend abdominal MRI in the next few months.  Given her  very high CA 19.9 level diagnosis, her risk of microscopic metastasis are very high, I recommend continue to monitor therapy for 4 to 6 months before proceeding to surgery.  I will reach out to Dr. Zenia Francis to review her case again. -she is tolerating treatment well overall. Labs reviewed, overall stable. Adequate to continue treatment.   2. Symptom Management: decreased appetite with weight loss, abdominal discomfort -she reports a ~40 lb weight loss over 4 months or so prior to diagnosis. She endorses trying to supplement with Ensure and Boost. She notes trying to obtain Glucerna due to her DM. -she met with dietician on 02/14/22. -she is taking mirtazapine -she has lost brain MRI with and without contrast lbs on chemo, stable in the last month.   3. Obstructive jaundice -presented with hyperbilirubinemia at 39.3 -s/p stent placement on 01/18/22 -tbili now WNL.   4. Anemia -hgb dropped to 6.4 by 01/25/22 while in the hospital. She received blood transfusion that day, and hgb recovered to 8.4 the same day. -hgb stable at 9.2 today (05/10/22)   5. Genetic Testing -she reports colon cancer in a brother (age 24) and a sister (age 14). -she proceeded with testing on 02/07/22. Results were negative.  6. DM, Hyperglycemia -previously on glipizide and metformin -BG has gone up with steroids/chemo, so her PCP started her on insulin.   7. New mouth tremor  -she report new onset mouth tremor in the past months, no other neurological symptoms, neuro exam was unremarkable today. -will obtain brain MRI with and without contrast in next week, and refer her to neuro oncologist Dr.   Kathyrn Francis: -lab and scan reviewed, I will review in  GI conference and discuss with Dr. Zenia Francis  -proceed with C5 gem/Abraxane today and in 1 week -lab, flush, and gem/abraxane in 3 and 4 weeks             -she prefers Thursday late morning or later -f/u in 3 weeks -brain MRI and referral to Dr. Mickeal Francis in next few weeks    No  problem-specific Assessment & Plan notes found for this encounter.   SUMMARY OF ONCOLOGIC HISTORY: Oncology History Overview Note   Cancer Staging  Pancreatic cancer [facility]) Staging form: Exocrine Pancreas, AJCC 8th Edition - Clinical stage from 01/18/2022: Stage IB (cT2, cN0, cM0) - Signed by Truitt Merle, MD on 01/28/2022    Pancreatic cancer ([place])  01/16/2022 Imaging   CLINICAL DATA:  Jaundice   EXAM: CT ABDOMEN AND PELVIS WITH CONTRAST  IMPRESSION: 1. Marked intra and extrahepatic biliary dilatation with marked dilatation of pancreatic duct and distended gallbladder. There is significant atrophy of the body and tail of pancreas. 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 2. Diverticular disease of the colon without acute wall thickening 3. Mild endometrial thickening up to 9 mm, recommend nonemergent pelvic ultrasound for further evaluation   01/17/2022 Imaging   EXAM: MRI ABDOMEN WITHOUT AND WITH CONTRAST (INCLUDING MRCP)  IMPRESSION: 1. Obstructing mass within the pancreatic head with an intraluminal component demonstrating low level enhancement. 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 2. This mass causes significant extrinsic narrowing at the portal/superior mesenteric vein confluence as well as marked intra and extrahepatic biliary dilatation. 3. No distant metastases identified. 4. Endoscopy for endoscopic ultrasound, tissue sampling and biliary decompression recommended.   01/18/2022 Cancer Staging   Staging form: Exocrine Pancreas, AJCC 8th Edition - Clinical stage from 01/18/2022: Stage IB (cT2, cN0, cM0) - Signed by Truitt Merle, MD on 01/28/2022 Stage prefix: Initial  diagnosis Total positive nodes: 0   01/18/2022 Initial Biopsy   FINAL MICROSCOPIC DIAGNOSIS:  A. PANCREAS, HEAD, FINE NEEDLE ASPIRATION:  - Malignant cells present consistent with adenocarcinoma   01/24/2022 Imaging   EXAM: CT CHEST WITHOUT CONTRAST  IMPRESSION: 1. No evidence of metastatic disease in the chest. 2. Atelectasis or consolidation in the right lower lung. 3. Healing right rib fractures. 4. Mass in the head of the pancreas. See previous report of CT abdomen and pelvis 01/16/2022. Interval placement of the biliary stent.   01/28/2022 Initial Diagnosis   Pancreatic cancer ([place])   02/16/2022 -  Chemotherapy   Patient is on Treatment Plan : PANCREATIC Abraxane / Gemcitabine D1,8 q21d     02/16/2022 Genetic Testing   Negative genetic testing on the CancerNext-Expanded+RNAinsight panel.  The report date is February 16, 2022.  The CancerNext-Expanded gene panel offered by [employer] and includes sequencing and rearrangement analysis for the following 77 genes: AIP, ALK, APC*, ATM*, AXIN2, BAP1, BARD1, BLM, BMPR1A, BRCA1*, BRCA2*, BRIP1*, CDC73, CDH1*, CDK4, CDKN1B, CDKN2A, CHEK2*, CTNNA1, DICER1, FANCC, FH, FLCN, GALNT12, KIF1B, LZTR1, MAX, MEN1, MET, MLH1*, MSH2*, MSH3, MSH6*, MUTYH*, NBN, NF1*, NF2, NTHL1, PALB2*, PHOX2B, PMS2*, POT1, PRKAR1A, PTCH1, PTEN*, RAD51C*, RAD51D*, RB1, RECQL, RET, SDHA, SDHAF2, SDHB, SDHC, SDHD, SMAD4, SMARCA4, SMARCB1, SMARCE1, STK11, SUFU, TMEM127, TP53*, TSC1, TSC2, VHL and XRCC2 (sequencing and deletion/duplication); EGFR, EGLN1, HOXB13, KIT, MITF, PDGFRA, POLD1, and POLE (sequencing  only); EPCAM and GREM1 (deletion/duplication only). DNA and RNA analyses performed for * genes.       INTERVAL HISTORY:  Weslie Rasmus is here for a follow up of pancreatic cancer. She was last seen by NP Anderson Malta on 04/26/22. She presents to the clinic accompanied by her husband. She reports she was started on insulin by her PCP due to her elevated sugars. However,  they report she has had low BG levels recently. She notes her appetite has returned.  She reports she feels overall better since starting treatment. She reports some continued lower-body weakness.   All other systems were reviewed with the patient and are negative.  MEDICAL HISTORY:  Past Medical History:  Diagnosis Date   Blood transfusion    Diabetes mellitus    Family history of breast cancer    Family history of colon cancer    Family history of prostate cancer    Heart murmur    Hypertension     SURGICAL HISTORY: Past Surgical History:  Procedure Laterality Date   BILIARY STENT PLACEMENT  01/18/2022   Procedure: BILIARY STENT PLACEMENT;  Surgeon: Carol Ada, MD;  Location: [place];  Service: Gastroenterology;;   DILATION AND CURETTAGE OF UTERUS     ERCP N/A 01/18/2022   Procedure: ENDOSCOPIC RETROGRADE CHOLANGIOPANCREATOGRAPHY (ERCP);  Surgeon: Carol Ada, MD;  Location: [facility];  Service: Gastroenterology;  Laterality: N/A;   ESOPHAGOGASTRODUODENOSCOPY N/A 01/18/2022   Procedure: ESOPHAGOGASTRODUODENOSCOPY (EGD);  Surgeon: Carol Ada, MD;  Location: [facility];  Service: Gastroenterology;  Laterality: N/A;   EUS  01/18/2022   Procedure: UPPER ENDOSCOPIC ULTRASOUND (EUS) LINEAR;  Surgeon: Carol Ada, MD;  [facility];  Service: Gastroenterology;;   FINE NEEDLE ASPIRATION  01/18/2022   Procedure: FINE NEEDLE ASPIRATION (FNA) LINEAR;  Surgeon: Carol Ada, MD;  Location: [facility] ENDOSCOPY;  Service: Gastroenterology;;   IR IMAGING GUIDED PORT INSERTION  02/15/2022   NO PAST SURGERIES     SPHINCTEROTOMY  01/18/2022   Procedure: Joan Mayans;  Surgeon: Carol Ada, MD;  Location: [place];  Service: Gastroenterology;;    I have reviewed the social history and family history with the patient and they are unchanged from previous note.  ALLERGIES:  has No Known Allergies.  MEDICATIONS:  Current Outpatient Medications  Medication Sig Dispense Refill    acetaminophen (TYLENOL) 500 MG tablet Take 1,000 mg by mouth every 6 (six) hours as needed for moderate pain or headache.     amLODipine (NORVASC) 5 MG tablet Take 5 mg by mouth daily.     Cholecalciferol (QC VITAMIN D3) 50 MCG (2000 UT) TABS Take 2,000 Units by mouth daily.     cholestyramine light (PREVALITE) 4 g packet Take 1 packet (4 g total) by mouth 2 (two) times daily. 60 packet 0   dronabinol (MARINOL) 5 MG capsule Take 5 mg by mouth 2 (two) times daily before a meal.     glipiZIDE (GLUCOTROL) 5 MG tablet Take 2.5 mg by mouth 2 (two) times daily with a meal.     KLOR-CON M20 20 MEQ tablet Take 20 mEq by mouth 2 (two) times daily.     lidocaine-prilocaine (EMLA) cream Apply to affected area once 30 g 3   metFORMIN (GLUCOPHAGE) 500 MG tablet Take by mouth daily with breakfast.     mirtazapine (REMERON) 7.5 MG tablet TAKE 1 TABLET BY MOUTH AT BEDTIME. 90 tablet 1   ondansetron (ZOFRAN) 8 MG tablet Take 1 tablet (8 mg total) by mouth 2 (two) times daily  as needed (Nausea or vomiting). 30 tablet 1   oxyCODONE (OXY IR/ROXICODONE) 5 MG immediate release tablet Take 1 tablet (5 mg total) by mouth every 3 (three) hours as needed for severe pain. 20 tablet 0   polyethylene glycol (MIRALAX / GLYCOLAX) 17 g packet Take 17 g by mouth daily. 14 each 0   prochlorperazine (COMPAZINE) 10 MG tablet Take 1 tablet (10 mg total) by mouth every 6 (six) hours as needed (Nausea or vomiting). 30 tablet 1   No current facility-administered medications for this visit.   Facility-Administered Medications Ordered in Other Visits  Medication Dose Route Frequency Provider Last Rate Last Admin   gemcitabine (GEMZAR) 1,786 mg in sodium chloride 0.9 % 250 mL chemo infusion  1,000 mg/m2 (Treatment Plan Recorded) Intravenous Once Truitt Merle, MD       heparin lock flush 100 unit/mL  500 Units Intracatheter Once PRN Truitt Merle, MD       PACLitaxel-protein bound (ABRAXANE) chemo infusion 175 mg  100 mg/m2 (Treatment Plan  Recorded) Intravenous Once Truitt Merle, MD 70 mL/hr at 05/10/22 1602 175 mg at 05/10/22 1602   sodium chloride flush (NS) 0.9 % injection 10 mL  10 mL Intracatheter PRN Truitt Merle, MD   10 mL at 04/26/22 1636   sodium chloride flush (NS) 0.9 % injection 10 mL  10 mL Intracatheter PRN Truitt Merle, MD        PHYSICAL EXAMINATION: ECOG PERFORMANCE STATUS: 1 - Symptomatic but completely ambulatory  Vitals:   05/10/22 1250  BP: (!) 146/83  Pulse: 71  Resp: 18  Temp: 98.7 F (37.1 C)  SpO2: 100%   Wt Readings from Last 3 Encounters:  05/10/22 144 lb (65.3 kg)  04/26/22 143 lb 6.4 oz (65 kg)  04/19/22 144 lb (65.3 kg)     GENERAL:alert, no distress and comfortable SKIN: skin color normal, no rashes or significant lesions EYES: normal, Conjunctiva are pink and non-injected, sclera clear  NEURO: alert & oriented x 3 with fluent speech, neuro exam today is unremarkable   LABORATORY DATA:  I have reviewed the data as listed    Latest Ref Rng & Units 05/10/2022   12:31 PM 04/26/2022   12:14 PM 04/19/2022    9:01 AM  CBC  WBC 4.0 - 10.5 K/uL 7.0  3.8  7.0   Hemoglobin 12.0 - 15.0 g/dL 9.2  9.1  10.0   Hematocrit 36.0 - 46.0 % 27.5  26.8  29.7   Platelets 150 - 400 K/uL 261  171  236         Latest Ref Rng & Units 05/10/2022   12:31 PM 04/26/2022   12:14 PM 04/19/2022    9:01 AM  CMP  Glucose 70 - 99 mg/dL 171  275  248   BUN 8 - 23 mg/dL $Remove'10  11  10   'utYcxgP$ Creatinine 0.44 - 1.00 mg/dL 0.49  0.48  0.56   Sodium 135 - 145 mmol/L 139  138  138   Potassium 3.5 - 5.1 mmol/L 3.9  3.8  3.5   Chloride 98 - 111 mmol/L 108  108  108   CO2 22 - 32 mmol/L $RemoveB'26  25  23   'OnFqdkZu$ Calcium 8.9 - 10.3 mg/dL 9.0  9.2  9.3   Total Protein 6.5 - 8.1 g/dL 6.1  6.5  6.4   Total Bilirubin 0.3 - 1.2 mg/dL 0.4  0.5  0.6   Alkaline Phos 38 - 126 U/L 157  185  130  AST 15 - 41 U/L $Remo'17  20  13   'eXqsU$ ALT 0 - 44 U/L $Remo'17  18  10       'qDVPk$ RADIOGRAPHIC STUDIES: I have personally reviewed the radiological images as listed and agreed  with the findings in the report. No results found.    No orders of the defined types were placed in this encounter.  All questions were answered. The patient knows to call the clinic with any problems, questions or concerns. No barriers to learning was detected. The total time spent in the appointment was 40 minutes.     Truitt Merle, MD 05/10/2022   I, Wilburn Mylar, am acting as scribe for Truitt Merle, MD.   I have reviewed the above documentation for accuracy and completeness, and I agree with the above.

## 2022-05-11 ENCOUNTER — Telehealth: Payer: Self-pay | Admitting: Internal Medicine

## 2022-05-11 NOTE — Addendum Note (Signed)
Addended by: Estella Husk on: 05/11/2022 02:22 PM   Modules accepted: Orders

## 2022-05-11 NOTE — Telephone Encounter (Signed)
Scheduled appt per 6/28 referral. Pt is aware of appt date and time. Pt is aware to arrive 15 mins prior to appt time and to bring and updated insurance card. Pt is aware of appt location.

## 2022-05-16 ENCOUNTER — Telehealth: Payer: Self-pay | Admitting: Hematology

## 2022-05-16 MED FILL — Dexamethasone Sodium Phosphate Inj 100 MG/10ML: INTRAMUSCULAR | Qty: 1 | Status: AC

## 2022-05-16 MED FILL — Fosaprepitant Dimeglumine For IV Infusion 150 MG (Base Eq): INTRAVENOUS | Qty: 5 | Status: AC

## 2022-05-16 NOTE — Telephone Encounter (Signed)
Scheduled follow-up appointments per 6/29 los. Patient is aware.

## 2022-05-17 ENCOUNTER — Inpatient Hospital Stay: Payer: Medicare HMO | Attending: Physician Assistant

## 2022-05-17 ENCOUNTER — Other Ambulatory Visit: Payer: Self-pay

## 2022-05-17 ENCOUNTER — Inpatient Hospital Stay: Payer: Medicare HMO

## 2022-05-17 VITALS — BP 131/79 | HR 65 | Temp 97.9°F | Resp 18 | Wt 140.2 lb

## 2022-05-17 DIAGNOSIS — D649 Anemia, unspecified: Secondary | ICD-10-CM | POA: Diagnosis not present

## 2022-05-17 DIAGNOSIS — K5900 Constipation, unspecified: Secondary | ICD-10-CM | POA: Diagnosis not present

## 2022-05-17 DIAGNOSIS — C250 Malignant neoplasm of head of pancreas: Secondary | ICD-10-CM | POA: Insufficient documentation

## 2022-05-17 DIAGNOSIS — Z5111 Encounter for antineoplastic chemotherapy: Secondary | ICD-10-CM | POA: Diagnosis not present

## 2022-05-17 DIAGNOSIS — K831 Obstruction of bile duct: Secondary | ICD-10-CM | POA: Insufficient documentation

## 2022-05-17 DIAGNOSIS — Z800 Family history of malignant neoplasm of digestive organs: Secondary | ICD-10-CM | POA: Diagnosis not present

## 2022-05-17 DIAGNOSIS — Z79899 Other long term (current) drug therapy: Secondary | ICD-10-CM | POA: Insufficient documentation

## 2022-05-17 DIAGNOSIS — Z95828 Presence of other vascular implants and grafts: Secondary | ICD-10-CM

## 2022-05-17 LAB — CBC WITH DIFFERENTIAL (CANCER CENTER ONLY)
Abs Immature Granulocytes: 0.04 10*3/uL (ref 0.00–0.07)
Basophils Absolute: 0 10*3/uL (ref 0.0–0.1)
Basophils Relative: 1 %
Eosinophils Absolute: 0 10*3/uL (ref 0.0–0.5)
Eosinophils Relative: 1 %
HCT: 26.6 % — ABNORMAL LOW (ref 36.0–46.0)
Hemoglobin: 9.2 g/dL — ABNORMAL LOW (ref 12.0–15.0)
Immature Granulocytes: 1 %
Lymphocytes Relative: 64 %
Lymphs Abs: 2.9 10*3/uL (ref 0.7–4.0)
MCH: 31.7 pg (ref 26.0–34.0)
MCHC: 34.6 g/dL (ref 30.0–36.0)
MCV: 91.7 fL (ref 80.0–100.0)
Monocytes Absolute: 0.1 10*3/uL (ref 0.1–1.0)
Monocytes Relative: 3 %
Neutro Abs: 1.3 10*3/uL — ABNORMAL LOW (ref 1.7–7.7)
Neutrophils Relative %: 30 %
Platelet Count: 254 10*3/uL (ref 150–400)
RBC: 2.9 MIL/uL — ABNORMAL LOW (ref 3.87–5.11)
RDW: 16.8 % — ABNORMAL HIGH (ref 11.5–15.5)
WBC Count: 4.4 10*3/uL (ref 4.0–10.5)
nRBC: 0.5 % — ABNORMAL HIGH (ref 0.0–0.2)

## 2022-05-17 LAB — CMP (CANCER CENTER ONLY)
ALT: 13 U/L (ref 0–44)
AST: 14 U/L — ABNORMAL LOW (ref 15–41)
Albumin: 3.5 g/dL (ref 3.5–5.0)
Alkaline Phosphatase: 160 U/L — ABNORMAL HIGH (ref 38–126)
Anion gap: 4 — ABNORMAL LOW (ref 5–15)
BUN: 11 mg/dL (ref 8–23)
CO2: 27 mmol/L (ref 22–32)
Calcium: 9.1 mg/dL (ref 8.9–10.3)
Chloride: 106 mmol/L (ref 98–111)
Creatinine: 0.51 mg/dL (ref 0.44–1.00)
GFR, Estimated: >60 mL/min (ref >60)
Glucose, Bld: 176 mg/dL — ABNORMAL HIGH (ref 70–99)
Potassium: 3.9 mmol/L (ref 3.5–5.1)
Sodium: 137 mmol/L (ref 135–145)
Total Bilirubin: 0.4 mg/dL (ref 0.3–1.2)
Total Protein: 6.4 g/dL — ABNORMAL LOW (ref 6.5–8.1)

## 2022-05-17 MED ORDER — SODIUM CHLORIDE 0.9 % IV SOLN
1000.0000 mg/m2 | Freq: Once | INTRAVENOUS | Status: AC
  Administered 2022-05-17: 1786 mg via INTRAVENOUS
  Filled 2022-05-17: qty 46.97

## 2022-05-17 MED ORDER — PACLITAXEL PROTEIN-BOUND CHEMO INJECTION 100 MG
100.0000 mg/m2 | Freq: Once | INTRAVENOUS | Status: AC
  Administered 2022-05-17: 175 mg via INTRAVENOUS
  Filled 2022-05-17: qty 35

## 2022-05-17 MED ORDER — SODIUM CHLORIDE 0.9 % IV SOLN
10.0000 mg | Freq: Once | INTRAVENOUS | Status: AC
  Administered 2022-05-17: 10 mg via INTRAVENOUS
  Filled 2022-05-17: qty 10

## 2022-05-17 MED ORDER — SODIUM CHLORIDE 0.9 % IV SOLN
150.0000 mg | Freq: Once | INTRAVENOUS | Status: AC
  Administered 2022-05-17: 150 mg via INTRAVENOUS
  Filled 2022-05-17: qty 150

## 2022-05-17 MED ORDER — PALONOSETRON HCL INJECTION 0.25 MG/5ML
0.2500 mg | Freq: Once | INTRAVENOUS | Status: AC
  Administered 2022-05-17: 0.25 mg via INTRAVENOUS
  Filled 2022-05-17: qty 5

## 2022-05-17 MED ORDER — SODIUM CHLORIDE 0.9% FLUSH
10.0000 mL | INTRAVENOUS | Status: DC | PRN
  Administered 2022-05-17: 10 mL

## 2022-05-17 MED ORDER — HEPARIN SOD (PORK) LOCK FLUSH 100 UNIT/ML IV SOLN
500.0000 [IU] | Freq: Once | INTRAVENOUS | Status: AC | PRN
  Administered 2022-05-17: 500 [IU]

## 2022-05-17 MED ORDER — SODIUM CHLORIDE 0.9% FLUSH
10.0000 mL | Freq: Once | INTRAVENOUS | Status: AC
  Administered 2022-05-17: 10 mL

## 2022-05-17 MED ORDER — SODIUM CHLORIDE 0.9 % IV SOLN: Freq: Once | INTRAVENOUS | Status: AC

## 2022-05-17 NOTE — Progress Notes (Signed)
Ok to treat today with ANC of 1.3 per Dr. Burr Medico

## 2022-05-17 NOTE — Patient Instructions (Signed)
[facility]  Discharge Instructions: Thank you for choosing [facility] to provide your oncology and hematology care.   If you have a lab appointment with the [facility], please go directly to the [facility] and check in at the registration area.   Wear comfortable clothing and clothing appropriate for easy access to any Portacath or PICC line.   We strive to give you quality time with your provider. You may need to reschedule your appointment if you arrive late (15 or more minutes).  Arriving late affects you and other patients whose appointments are after yours.  Also, if you miss three or more appointments without notifying the office, you may be dismissed from the clinic at the provider's discretion.      For prescription refill requests, have your pharmacy contact our office and allow 72 hours for refills to be completed.    Today you received the following chemotherapy and/or immunotherapy agents Gemzar, Abraxane      To help prevent nausea and vomiting after your treatment, we encourage you to take your nausea medication as directed.  BELOW ARE SYMPTOMS THAT SHOULD BE REPORTED IMMEDIATELY: *FEVER GREATER THAN 100.4 F (38 C) OR HIGHER *CHILLS OR SWEATING *NAUSEA AND VOMITING THAT IS NOT CONTROLLED WITH YOUR NAUSEA MEDICATION *UNUSUAL SHORTNESS OF BREATH *UNUSUAL BRUISING OR BLEEDING *URINARY PROBLEMS (pain or burning when urinating, or frequent urination) *BOWEL PROBLEMS (unusual diarrhea, constipation, pain near the anus) TENDERNESS IN MOUTH AND THROAT WITH OR WITHOUT PRESENCE OF ULCERS (sore throat, sores in mouth, or a toothache) UNUSUAL RASH, SWELLING OR PAIN  UNUSUAL VAGINAL DISCHARGE OR ITCHING   Items with * indicate a potential emergency and should be followed up as soon as possible or go to the Emergency Department if any problems should occur.  Please show the CHEMOTHERAPY ALERT CARD or IMMUNOTHERAPY ALERT CARD at  check-in to the Emergency Department and triage nurse.  Should you have questions after your visit or need to cancel or reschedule your appointment, please contact [facility]  Dept: [phone]  and follow the prompts.  Office hours are 8:00 a.m. to 4:30 p.m. Monday - Friday. Please note that voicemails left after 4:00 p.m. may not be returned until the following business day.  We are closed weekends and major holidays. You have access to a nurse at all times for urgent questions. Please call the main number to the clinic Dept: [phone] and follow the prompts.   For any non-urgent questions, you may also contact your provider using MyChart. We now offer e-Visits for anyone 70 and older to request care online for non-urgent symptoms. For details visit [email or web address].   Also download the MyChart app! Go to the app store, search "MyChart", open the app, select [facility], and log in with your MyChart username and password.  Masks are optional in the cancer centers. If you would like for your care team to wear a mask while they are taking care of you, please let them know. For doctor visits, patients may have with them one support person who is at least 75 years old. At this time, visitors are not allowed in the infusion area.

## 2022-05-18 LAB — CANCER ANTIGEN 19-9: CA 19-9: 2399 U/mL — ABNORMAL HIGH (ref 0–35)

## 2022-05-23 ENCOUNTER — Other Ambulatory Visit: Payer: Self-pay

## 2022-05-23 NOTE — Progress Notes (Signed)
The proposed treatment discussed in conference is for discussion purpose only and is not a binding recommendation.  The patients have not been physically examined, or presented with their treatment options.  Therefore, final treatment plans cannot be decided.  

## 2022-05-24 ENCOUNTER — Inpatient Hospital Stay (HOSPITAL_BASED_OUTPATIENT_CLINIC_OR_DEPARTMENT_OTHER): Payer: Medicare HMO | Admitting: Internal Medicine

## 2022-05-24 ENCOUNTER — Other Ambulatory Visit: Payer: Self-pay

## 2022-05-24 ENCOUNTER — Other Ambulatory Visit (HOSPITAL_BASED_OUTPATIENT_CLINIC_OR_DEPARTMENT_OTHER): Payer: Self-pay

## 2022-05-24 DIAGNOSIS — Z5111 Encounter for antineoplastic chemotherapy: Secondary | ICD-10-CM | POA: Diagnosis not present

## 2022-05-24 DIAGNOSIS — G244 Idiopathic orofacial dystonia: Secondary | ICD-10-CM | POA: Diagnosis not present

## 2022-05-24 DIAGNOSIS — C250 Malignant neoplasm of head of pancreas: Secondary | ICD-10-CM

## 2022-05-24 MED ORDER — ONDANSETRON HCL 8 MG PO TABS
8.0000 mg | ORAL_TABLET | Freq: Two times a day (BID) | ORAL | 1 refills | Status: DC | PRN
  Filled 2022-05-24: qty 30, 15d supply, fill #0

## 2022-05-24 MED ORDER — PROCHLORPERAZINE MALEATE 10 MG PO TABS
10.0000 mg | ORAL_TABLET | Freq: Four times a day (QID) | ORAL | 1 refills | Status: DC | PRN
  Filled 2022-05-24: qty 30, 8d supply, fill #0

## 2022-05-24 MED ORDER — AMITRIPTYLINE HCL 50 MG PO TABS
50.0000 mg | ORAL_TABLET | Freq: Every day | ORAL | 2 refills | Status: DC
  Filled 2022-05-24: qty 30, 30d supply, fill #0

## 2022-05-24 NOTE — Progress Notes (Signed)
[facility] [address]  [place], [place] [address] [phone]   New Patient Evaluation  Date of Service: 05/24/22 Patient Name: Dannie Woolen Patient MRN: [id number] Patient DOB: 1946-06-07 Provider: Henreitta Leber, MD  Identifying Statement:  Sherina Stammer is a 76 y.o. 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    Referring Provider: Malachy Mood, MD [address] [place],  [place] [address]  Primary Cancer:  Oncologic History: Oncology History Overview Note   Cancer Staging  Pancreatic cancer [facility]) Staging form: Exocrine Pancreas, AJCC 8th Edition - Clinical stage from 01/18/2022: Stage IB (cT2, cN0, cM0) - Signed by Malachy Mood, MD on 01/28/2022    Pancreatic cancer (HCC)  01/16/2022 Imaging   CLINICAL DATA:  Jaundice   EXAM: CT ABDOMEN AND PELVIS WITH CONTRAST  IMPRESSION: 1. Marked intra and extrahepatic biliary dilatation with marked dilatation of pancreatic duct and distended gallbladder. There is significant atrophy of the body and tail of pancreas. 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 2. Diverticular disease of the colon without acute wall thickening 3. Mild endometrial thickening up to 9 mm, recommend nonemergent pelvic ultrasound for further evaluation   01/17/2022 Imaging   EXAM: MRI ABDOMEN WITHOUT AND WITH CONTRAST (INCLUDING MRCP)  IMPRESSION: 1. Obstructing mass within the pancreatic head with an intraluminal component demonstrating low level enhancement. 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 2. This  mass causes significant extrinsic narrowing at the portal/superior mesenteric vein confluence as well as marked intra and extrahepatic biliary dilatation. 3. No distant metastases identified. 4. Endoscopy for endoscopic ultrasound, tissue sampling and biliary decompression recommended.   01/18/2022 Cancer Staging   Staging form: Exocrine Pancreas, AJCC 8th Edition - Clinical stage from 01/18/2022: Stage IB (cT2, cN0, cM0) - Signed by Malachy Mood, MD on 01/28/2022 Stage prefix: Initial diagnosis Total positive nodes: 0   01/18/2022 Initial Biopsy   FINAL MICROSCOPIC DIAGNOSIS:  A. PANCREAS, HEAD, FINE NEEDLE ASPIRATION:  - Malignant cells present consistent with adenocarcinoma   01/24/2022 Imaging   EXAM: CT CHEST WITHOUT CONTRAST  IMPRESSION: 1. No evidence of metastatic disease in the chest. 2. Atelectasis or consolidation in the right lower lung. 3. Healing right rib fractures. 4. Mass in the head of the pancreas. See previous report of CT abdomen and pelvis 01/16/2022. Interval placement of the biliary stent.   01/28/2022 Initial Diagnosis   Pancreatic cancer (HCC)   02/16/2022 -  Chemotherapy   Patient is on Treatment Plan : PANCREATIC Abraxane / Gemcitabine D1,8 q21d     02/16/2022 Genetic Testing   Negative genetic testing on the CancerNext-Expanded+RNAinsight panel.  The report date is February 16, 2022.  The CancerNext-Expanded gene panel offered by [facility] and includes sequencing and rearrangement analysis for the following 77 genes: AIP, ALK, APC*, ATM*, AXIN2, BAP1, BARD1, BLM, BMPR1A, BRCA1*, BRCA2*, BRIP1*, CDC73, CDH1*, CDK4, CDKN1B, CDKN2A, CHEK2*, CTNNA1, DICER1, FANCC, FH, FLCN, GALNT12, KIF1B, LZTR1, MAX, MEN1, MET, MLH1*, MSH2*, MSH3, MSH6*, MUTYH*, NBN, NF1*, NF2, NTHL1, PALB2*, PHOX2B, PMS2*, POT1, PRKAR1A, PTCH1, PTEN*, RAD51C*, RAD51D*, RB1, RECQL, RET, SDHA, SDHAF2, SDHB, SDHC, SDHD, SMAD4, SMARCA4, SMARCB1, SMARCE1, STK11, SUFU, TMEM127, TP53*, TSC1, TSC2, VHL and  XRCC2 (sequencing  and deletion/duplication); EGFR, EGLN1, HOXB13, KIT, MITF, PDGFRA, POLD1, and POLE (sequencing only); EPCAM and GREM1 (deletion/duplication only). DNA and RNA analyses performed for * genes.      History of Present Illness: The patient's records from the referring physician were obtained and reviewed and the patient interviewed to confirm this HPI.  Quandra Collister presents today to discuss involuntary face/jaw movements.  She describes and demonstrates "chattering" of teeth and movement of the lower jaw.  This comes and goes, has been present in some form for several years at least.  It is more severe and burdensome ever since starting the chemotherapy treatments.  Denies any issues with gait, vision, weakness, numbness of limbs.  She does complain of poor sleep despite lifestyle interventions and dosing melatonin.  She has been dosing standing compazine for nausea each day, despite very rarely experiencing any nausea.  Medications: Current Outpatient Medications on File Prior to Visit  Medication Sig Dispense Refill   acetaminophen (TYLENOL) 500 MG tablet Take 1,000 mg by mouth every 6 (six) hours as needed for moderate pain or headache.     amLODipine (NORVASC) 5 MG tablet Take 5 mg by mouth daily.     Cholecalciferol (QC VITAMIN D3) 50 MCG (2000 UT) TABS Take 2,000 Units by mouth daily.     dronabinol (MARINOL) 5 MG capsule Take 5 mg by mouth 2 (two) times daily before a meal.     KLOR-CON M20 20 MEQ tablet Take 20 mEq by mouth 2 (two) times daily.     NOVOLOG FLEXPEN 100 UNIT/ML FlexPen Inject 15 Units into the skin 2 (two) times daily.     oxyCODONE (OXY IR/ROXICODONE) 5 MG immediate release tablet Take 1 tablet (5 mg total) by mouth every 3 (three) hours as needed for severe pain. 20 tablet 0   polyethylene glycol (MIRALAX / GLYCOLAX) 17 g packet Take 17 g by mouth daily. 14 each 0   lidocaine-prilocaine (EMLA) cream Apply to affected area once (Patient not taking: Reported  on 05/11/2022) 30 g 3   Current Facility-Administered Medications on File Prior to Visit  Medication Dose Route Frequency Provider Last Rate Last Admin   sodium chloride flush (NS) 0.9 % injection 10 mL  10 mL Intracatheter PRN Truitt Merle, MD   10 mL at 04/26/22 1636    Allergies: No Known Allergies Past Medical History:  Past Medical History:  Diagnosis Date   Blood transfusion    Diabetes mellitus    Family history of breast cancer    Family history of colon cancer    Family history of prostate cancer    Heart murmur    Hypertension    Past Surgical History:  Past Surgical History:  Procedure Laterality Date   BILIARY STENT PLACEMENT  01/18/2022   Procedure: BILIARY STENT PLACEMENT;  Surgeon: Carol Ada, MD;  Location: [place];  Service: Gastroenterology;;   DILATION AND CURETTAGE OF UTERUS     ERCP N/A 01/18/2022   Procedure: ENDOSCOPIC RETROGRADE CHOLANGIOPANCREATOGRAPHY (ERCP);  Surgeon: Carol Ada, MD;  Location: [place];  Service: Gastroenterology;  Laterality: N/A;   ESOPHAGOGASTRODUODENOSCOPY N/A 01/18/2022   Procedure: ESOPHAGOGASTRODUODENOSCOPY (EGD);  Surgeon: Carol Ada, MD;  Location: [place];  Service: Gastroenterology;  Laterality: N/A;   EUS  01/18/2022   Procedure: UPPER ENDOSCOPIC ULTRASOUND (EUS) LINEAR;  Surgeon: Carol Ada, MD;  Location[facility];  Service: Gastroenterology;;   FINE NEEDLE ASPIRATION  01/18/2022   Procedure: FINE NEEDLE ASPIRATION (FNA) LINEAR;  Surgeon: Carol Ada, MD;  Location: [facility];  Service: Gastroenterology;;   IR IMAGING GUIDED PORT INSERTION  02/15/2022   NO PAST SURGERIES     SPHINCTEROTOMY  01/18/2022   Procedure: SPHINCTEROTOMY;  Surgeon: Carol Ada, MD;  Location: [facility] END[facility]COPY;  Service: Gastroenterology;;   Social History:  Social History   Socioeconomic History   Marital status: Married    Spouse name: Not on file   Number of children: 2   Years of education: Not on file   Highest  education level: Not on file  Occupational History   Not on file  Tobacco Use   Smoking status: Never   Smokeless tobacco: Never  Substance and Sexual Activity   Alcohol use: Never   Drug use: Never   Sexual activity: Not on file  Other Topics Concern   Not on file  Social History Narrative   Not on file   Social Determinants of Health   Financial Resource Strain: Not on file  Food Insecurity: Not on file  Transportation Needs: Not on file  Physical Activity: Not on file  Stress: Not on file  Social Connections: Not on file  Intimate Partner Violence: Not on file   Family History:  Family History  Problem Relation Age of Onset   Diabetes type II Mother    Stroke Father    Diabetes type II Sister    Colon cancer Sister 24   Colon cancer Brother 19   Breast cancer Maternal Aunt 90   Breast cancer Maternal Aunt 94   Prostate cancer Maternal Uncle    Heart attack Maternal Grandmother     Review of Systems: Constitutional: Doesn't report fevers, chills or abnormal weight loss Eyes: Doesn't report blurriness of vision Ears, nose, mouth, throat, and face: Doesn't report sore throat Respiratory: Doesn't report cough, dyspnea or wheezes Cardiovascular: Doesn't report palpitation, chest discomfort  Gastrointestinal:  Doesn't report nausea, constipation, diarrhea GU: Doesn't report incontinence Skin: Doesn't report skin rashes Neurological: Per HPI Musculoskeletal: Doesn't report joint pain Behavioral/Psych: Doesn't report anxiety  Physical Exam: Vitals:   05/24/22 1425  BP: (!) 146/91  Pulse: 80  Resp: 17  Temp: 98.3 F (36.8 C)  SpO2: 98%   KPS: 80. General: Alert, cooperative, pleasant, in no acute distress Head: Normal EENT: No conjunctival injection or scleral icterus.  Lungs: Resp effort normal Cardiac: Regular rate Abdomen: Non-distended abdomen Skin: No rashes cyanosis or petechiae. Extremities: No clubbing or edema  Neurologic Exam: Mental  Status: Awake, alert, attentive to examiner. Oriented to self and environment. Language is fluent with intact comprehension.  Cranial Nerves: Visual acuity is grossly normal. Visual fields are full. Extra-ocular movements intact. No ptosis. Face is symmetric Motor: Tone and bulk are normal. Power is full in both arms and legs. Noted jaw dyskinesia Reflexes are symmetric, no pathologic reflexes present.  Sensory: Intact to light touch Gait: Normal.   Labs: I have reviewed the data as listed    Component Value Date/Time   NA 137 05/17/2022 1319   K 3.9 05/17/2022 1319   CL 106 05/17/2022 1319   CO2 27 05/17/2022 1319   GLUCOSE 176 (H) 05/17/2022 1319   BUN 11 05/17/2022 1319   CREATININE 0.51 05/17/2022 1319   CALCIUM 9.1 05/17/2022 1319   PROT 6.4 (L) 05/17/2022 1319   ALBUMIN 3.5 05/17/2022 1319   AST 14 (L) 05/17/2022 1319   ALT 13 05/17/2022 1319   ALKPHOS 160 (H) 05/17/2022 1319   BILITOT 0.4 05/17/2022 1319   GFRNONAA >60 05/17/2022 1319  GFRAA >60 10/31/2018 1610   Lab Results  Component Value Date   WBC 4.4 05/17/2022   NEUTROABS 1.3 (L) 05/17/2022   HGB 9.2 (L) 05/17/2022   HCT 26.6 (L) 05/17/2022   MCV 91.7 05/17/2022   PLT 254 05/17/2022    Imaging:  CT ABD PELVIS W/WO CM ONCOLOGY PANCREATIC PROTOCOL  Result Date: 05/09/2022 CLINICAL DATA:  Malignant neoplasm of the head of the pancreas. Restaging. * Tracking Code: BO * EXAM: CT ABDOMEN AND PELVIS WITHOUT AND WITH CONTRAST TECHNIQUE: Multidetector CT imaging of the abdomen and pelvis was performed following the standard protocol before and following the bolus administration of intravenous contrast. RADIATION DOSE REDUCTION: This exam was performed according to the departmental dose-optimization program which includes automated exposure control, adjustment of the mA and/or kV according to patient size and/or use of iterative reconstruction technique. CONTRAST:  175mL OMNIPAQUE IOHEXOL 350 MG/ML SOLN COMPARISON:   Abdominal MRI 01/17/2022. Abdominopelvic CT 01/16/2022. FINDINGS: Lower chest: Clear lung bases. No significant pleural or pericardial effusion. Hepatobiliary: Interval metallic biliary stenting with biliary decompression and pneumobilia. Gas and small stones are present within the gallbladder lumen. There are small low-density lesions within the right hepatic lobe measuring up to 7 mm on image 20/9 and 5 mm on image 31/9, not clearly seen previously. While these may relate to residual dilated biliary radicles, small hepatic metastases are difficult to exclude. No other focal hepatic abnormalities are seen. Pancreas: The previously demonstrated heterogeneous mass within the pancreatic neck is not well defined. Ductal dilatation within the pancreatic tail has improved. Persistent soft tissue encasement of the celiac trunk, common hepatic artery and porta splenic confluence. Spleen: Normal in size without focal abnormality. Adrenals/Urinary Tract: Both adrenal glands appear normal. Stable small renal cysts bilaterally. No evidence of enhancing renal mass, urinary tract calculus or hydronephrosis. The bladder appears unremarkable for its degree of distention. Stomach/Bowel: Enteric contrast was administered and has passed into the distal small bowel. The stomach appears unremarkable for its degree of distension. No evidence of bowel wall thickening, distention or surrounding inflammatory change. The appendix appears normal. Vascular/Lymphatic: There are no enlarged abdominal or pelvic lymph nodes. Aortic and branch vessel atherosclerosis. Persistent extrinsic compression of the portal and superior mesenteric veins near the porta splenic confluence. No evidence of large vessel occlusion or intraluminal thrombus. Reproductive: Possible mild focal endometrial thickening as previously demonstrated. The uterus and ovaries otherwise appear unremarkable. No adnexal mass. Other: No ascites or peritoneal nodularity.  Intact  abdominal wall. Musculoskeletal: No acute or significant osseous findings. Multilevel lumbar facet arthropathy with sacroiliac degenerative changes bilaterally. IMPRESSION: 1. Interval biliary stenting with decompression of the biliary system and pneumobilia. 2. Previously demonstrated heterogeneous mass involving the pancreatic head and neck is less well-defined, and there is less pancreatic ductal dilatation, consistent with response to therapy. Persistent extrinsic mass effect on the portal/SMV confluence without evidence of acute intravascular thrombus. 3. Possible new small lesions in the right hepatic lobe which could reflect early metastases. Consider further evaluation with abdominal MRI or short-term CT follow-up. No other evidence of metastatic disease. 4. Persistent possible focal endometrial thickening for which pelvic ultrasound is again recommended unless already performed. Electronically Signed   By: Richardean Sale M.D.   On: 05/09/2022 14:57      Assessment/Plan Jaw dyskinesia  Adelee Nored presents with dyskinetic movements affecting the face and jaw.  These had been present, mildly, prior to initiation of cancer treatments.  There are no other focal neurologic deficits, nothing localizing  on exam.  We suspect movements may be partly an effect of anti-dopaminergic properties of compazine.  She does not feel she needs to continue this medicine for nausea.  We recommended she hold it and use zofran PRN instead.    Recommended trial of Elavil $RemoveB'50mg'[id number]$  HS for sleep and chemotherapy associated neuropathy symptoms.  She is agreeable with this.  May discontinue MRI brain given non localizing exam.  We spent twenty additional minutes teaching regarding the natural history, biology, and historical experience in the treatment of neurologic complications of cancer.   We appreciate the opportunity to participate in the care of Lashane Klaus.  She may follow up with [place] as needed.  All  questions were answered. The patient knows to call the clinic with any problems, questions or concerns. No barriers to learning were detected.  The total time spent in the encounter was 40 minutes and more than 50% was on counseling and review of test results   Ventura Sellers, MD Medical Director of Neuro-Oncology [facility] [place] 05/24/22 3:11 PM

## 2022-05-25 ENCOUNTER — Other Ambulatory Visit (HOSPITAL_BASED_OUTPATIENT_CLINIC_OR_DEPARTMENT_OTHER): Payer: Self-pay

## 2022-05-29 ENCOUNTER — Other Ambulatory Visit: Payer: Self-pay

## 2022-05-29 DIAGNOSIS — C250 Malignant neoplasm of head of pancreas: Secondary | ICD-10-CM

## 2022-05-30 MED FILL — Fosaprepitant Dimeglumine For IV Infusion 150 MG (Base Eq): INTRAVENOUS | Qty: 5 | Status: AC

## 2022-05-30 MED FILL — Dexamethasone Sodium Phosphate Inj 100 MG/10ML: INTRAMUSCULAR | Qty: 1 | Status: AC

## 2022-05-30 NOTE — Progress Notes (Signed)
[place]   [phone] [phone]   Clinic Follow up Note   Patient Care Team: [name], MD as PCP - General (Internal Medicine) Truitt Merle, MD as Consulting Physician (Oncology) Royston Bake, RN as Oncology Nurse Navigator (Oncology) 05/31/2022  CHIEF COMPLAINT: Follow up pancreas cancer   SUMMARY OF ONCOLOGIC HISTORY: Oncology History Overview Note   Cancer Staging  Pancreatic cancer [facility]) Staging form: Exocrine Pancreas, AJCC 8th Edition - Clinical stage from 01/18/2022: Stage IB (cT2, cN0, cM0) - Signed by Truitt Merle, MD on 01/28/2022    Pancreatic cancer ([place])  01/16/2022 Imaging   CLINICAL DATA:  Jaundice   EXAM: CT ABDOMEN AND PELVIS WITH CONTRAST  IMPRESSION: 1. Marked intra and extrahepatic biliary dilatation with marked dilatation of pancreatic duct and distended gallbladder. There is significant atrophy of the body and tail of pancreas. 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 2. Diverticular disease of the colon without acute wall thickening 3. Mild endometrial thickening up to 9 mm, recommend nonemergent pelvic ultrasound for further evaluation   01/17/2022 Imaging   EXAM: MRI ABDOMEN WITHOUT AND WITH CONTRAST (INCLUDING MRCP)  IMPRESSION: 1. Obstructing mass within the pancreatic head with an intraluminal component demonstrating low level enhancement. 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 2. This mass causes significant extrinsic narrowing at the portal/superior mesenteric vein confluence as well as marked intra and extrahepatic biliary dilatation. 3. No distant metastases identified. 4. Endoscopy for  endoscopic ultrasound, tissue sampling and biliary decompression recommended.   01/18/2022 Cancer Staging   Staging form: Exocrine Pancreas, AJCC 8th Edition - Clinical stage from 01/18/2022: Stage IB (cT2, cN0, cM0) - Signed by Truitt Merle, MD on 01/28/2022 Stage prefix: Initial diagnosis Total positive nodes: 0   01/18/2022 Initial Biopsy   FINAL MICROSCOPIC DIAGNOSIS:  A. PANCREAS, HEAD, FINE NEEDLE ASPIRATION:  - Malignant cells present consistent with adenocarcinoma   01/24/2022 Imaging   EXAM: CT CHEST WITHOUT CONTRAST  IMPRESSION: 1. No evidence of metastatic disease in the chest. 2. Atelectasis or consolidation in the right lower lung. 3. Healing right rib fractures. 4. Mass in the head of the pancreas. See previous report of CT abdomen and pelvis 01/16/2022. Interval placement of the biliary stent.   01/28/2022 Initial Diagnosis   Pancreatic cancer ([place])   02/16/2022 -  Chemotherapy   Patient is on Treatment Plan : PANCREATIC Abraxane / Gemcitabine D1,8 q21d     02/16/2022 Genetic Testing   Negative genetic testing on the CancerNext-Expanded+RNAinsight panel.  The report date is February 16, 2022.  The CancerNext-Expanded gene panel offered by [facility] and includes sequencing and rearrangement analysis for the following 77 genes: AIP, ALK, APC*, ATM*, AXIN2, BAP1, BARD1, BLM, BMPR1A, BRCA1*, BRCA2*, BRIP1*, CDC73, CDH1*, CDK4, CDKN1B, CDKN2A, CHEK2*, CTNNA1, DICER1, FANCC, FH, FLCN, GALNT12, KIF1B, LZTR1, MAX, MEN1, MET, MLH1*, MSH2*, MSH3, MSH6*, MUTYH*, NBN, NF1*, NF2, NTHL1, PALB2*, PHOX2B, PMS2*, POT1, PRKAR1A, PTCH1, PTEN*, RAD51C*, RAD51D*, RB1, RECQL, RET, SDHA, SDHAF2, SDHB, SDHC, SDHD, SMAD4, SMARCA4, SMARCB1, SMARCE1, STK11, SUFU, TMEM127, TP53*, TSC1, TSC2, VHL and XRCC2 (sequencing and deletion/duplication); EGFR, EGLN1, HOXB13, KIT, MITF, PDGFRA, POLD1, and POLE (sequencing only); EPCAM and GREM1 (deletion/duplication only). DNA and RNA analyses performed for * genes.  CURRENT THERAPY:  Neoadjuvant Gemcitabine, days 1 and 8 q21d, starting 02/16/22 -Abraxane added with C2 (03/08/22)  INTERVAL HISTORY: Miranda Francis returns for follow up as scheduled. Last seen by Dr. Burr Medico 05/10/22 and completed another cycle of gem/abraxane.  She feels well and continues to tolerate treatment.  Appetite increased recently.  Fatigue fluctuates but overall stable and she remains out of bed and active at home.  She has occasional constipation which she manages.  Her temperature fluctuates and she has occasional teeth chattering.  She stays cold, Tmax 99.4.  She has a slight intermittent cough she attributes to more salivating.  She has mild tingling in her toes, none in hands.  Dr. Mickeal Skinner recommended Elavil which she did not take due to side effect profile.  Otherwise denies fever, cough, chest pain, dyspnea, leg edema, nausea/vomiting, pain, dysuria, or any other new specific complaints.  All other systems were reviewed with the patient and are negative.  MEDICAL HISTORY:  Past Medical History:  Diagnosis Date   Blood transfusion    Diabetes mellitus    Family history of breast cancer    Family history of colon cancer    Family history of prostate cancer    Heart murmur    Hypertension     SURGICAL HISTORY: Past Surgical History:  Procedure Laterality Date   BILIARY STENT PLACEMENT  01/18/2022   Procedure: BILIARY STENT PLACEMENT;  Surgeon: Carol Ada, MD;  Location: [place];  Service: Gastroenterology;;   DILATION AND CURETTAGE OF UTERUS     ERCP N/A 01/18/2022   Procedure: ENDOSCOPIC RETROGRADE CHOLANGIOPANCREATOGRAPHY (ERCP);  Surgeon: Carol Ada, MD;  Location: [place];  Service: Gastroenterology;  Laterality: N/A;   ESOPHAGOGASTRODUODENOSCOPY N/A 01/18/2022   Procedure: ESOPHAGOGASTRODUODENOSCOPY (EGD);  Surgeon: Carol Ada, MD;  Location: [place];  Service: Gastroenterology;  Laterality: N/A;   EUS  01/18/2022   Procedure: UPPER ENDOSCOPIC  ULTRASOUND (EUS) LINEAR;  Surgeon: Carol Ada, MD;  Location[facility] ENDOSCOPY;  Service: Gastroenterology;;   FINE NEEDLE ASPIRATION  01/18/2022   Procedure: FINE NEEDLE ASPIRATION (FNA) LINEAR;  Surgeon: Carol Ada, MD;  Location: [facility] ENDOSCOPY;  Service: Gastroenterology;;   IR IMAGING GUIDED PORT INSERTION  02/15/2022   NO PAST SURGERIES     SPHINCTEROTOMY  01/18/2022   Procedure: Joan Mayans;  Surgeon: Carol Ada, MD;  Location: [place];  Service: Gastroenterology;;    I have reviewed the social history and family history with the patient and they are unchanged from previous note.  ALLERGIES:  has No Known Allergies.  MEDICATIONS:  Current Outpatient Medications  Medication Sig Dispense Refill   acetaminophen (TYLENOL) 500 MG tablet Take 1,000 mg by mouth every 6 (six) hours as needed for moderate pain or headache.     amLODipine (NORVASC) 5 MG tablet Take 5 mg by mouth daily.     Cholecalciferol (QC VITAMIN D3) 50 MCG (2000 UT) TABS Take 2,000 Units by mouth daily.     KLOR-CON M20 20 MEQ tablet Take 20 mEq by mouth 2 (two) times daily.     lidocaine-prilocaine (EMLA) cream Apply to affected area once (Patient not taking: Reported on 05/11/2022) 30 g 3   NOVOLOG FLEXPEN 100 UNIT/ML FlexPen Inject 15 Units into the skin 2 (two) times daily.     ondansetron (ZOFRAN) 8 MG tablet Take 1 tablet (8 mg total) by mouth 2 (two) times daily as needed (Nausea or vomiting). 30 tablet 1   oxyCODONE (OXY IR/ROXICODONE) 5 MG immediate release tablet Take 1 tablet (5 mg  total) by mouth every 3 (three) hours as needed for severe pain. 20 tablet 0   polyethylene glycol (MIRALAX / GLYCOLAX) 17 g packet Take 17 g by mouth daily. 14 each 0   No current facility-administered medications for this visit.   Facility-Administered Medications Ordered in Other Visits  Medication Dose Route Frequency Provider Last Rate Last Admin   dexamethasone (DECADRON) 10 mg in sodium chloride 0.9 % 50 mL IVPB  10 mg  Intravenous Once Truitt Merle, MD       fosaprepitant (EMEND) 150 mg in sodium chloride 0.9 % 145 mL IVPB  150 mg Intravenous Once Truitt Merle, MD       gemcitabine (GEMZAR) 1,786 mg in sodium chloride 0.9 % 250 mL chemo infusion  1,000 mg/m2 (Treatment Plan Recorded) Intravenous Once Truitt Merle, MD       heparin lock flush 100 unit/mL  500 Units Intracatheter Once PRN Truitt Merle, MD       PACLitaxel-protein bound (ABRAXANE) chemo infusion 175 mg  100 mg/m2 (Treatment Plan Recorded) Intravenous Once Truitt Merle, MD       palonosetron (ALOXI) injection 0.25 mg  0.25 mg Intravenous Once Truitt Merle, MD       sodium chloride flush (NS) 0.9 % injection 10 mL  10 mL Intracatheter PRN Truitt Merle, MD   10 mL at 04/26/22 1636   sodium chloride flush (NS) 0.9 % injection 10 mL  10 mL Intracatheter PRN Truitt Merle, MD        PHYSICAL EXAMINATION: ECOG PERFORMANCE STATUS: 1 - Symptomatic but completely ambulatory  Vitals:   05/31/22 0925  BP: (!) 143/90  Pulse: 67  Resp: 16  Temp: 97.7 F (36.5 C)  SpO2: 98%   Filed Weights   05/31/22 0925  Weight: 145 lb 6.4 oz (66 kg)    GENERAL:alert, no distress and comfortable SKIN: No rash EYES: sclera clear LUNGS: clear with normal breathing effort HEART: no lower extremity edema NEURO: alert & oriented x 3 with fluent speech, no focal motor deficits PAC without erythema  LABORATORY DATA:  I have reviewed the data as listed    Latest Ref Rng & Units 05/31/2022    9:05 AM 05/17/2022    1:19 PM 05/10/2022   12:31 PM  CBC  WBC 4.0 - 10.5 K/uL 7.3  4.4  7.0   Hemoglobin 12.0 - 15.0 g/dL 9.2  9.2  9.2   Hematocrit 36.0 - 46.0 % 27.2  26.6  27.5   Platelets 150 - 400 K/uL 262  254  261         Latest Ref Rng & Units 05/31/2022    9:05 AM 05/17/2022    1:19 PM 05/10/2022   12:31 PM  CMP  Glucose 70 - 99 mg/dL 215  176  171   BUN 8 - 23 mg/dL $Remove'12  11  10   'YKbwxUG$ Creatinine 0.44 - 1.00 mg/dL 0.53  0.51  0.49   Sodium 135 - 145 mmol/L 138  137  139   Potassium 3.5 -  5.1 mmol/L 3.7  3.9  3.9   Chloride 98 - 111 mmol/L 108  106  108   CO2 22 - 32 mmol/L $RemoveB'25  27  26   'LwjQRzkf$ Calcium 8.9 - 10.3 mg/dL 9.1  9.1  9.0   Total Protein 6.5 - 8.1 g/dL 6.4  6.4  6.1   Total Bilirubin 0.3 - 1.2 mg/dL 0.4  0.4  0.4   Alkaline Phos 38 - 126 U/L 168  160  157   AST 15 - 41 U/L $Remo'16  14  17   'IVadS$ ALT 0 - 44 U/L $Remo'13  13  17       'JeeZF$ RADIOGRAPHIC STUDIES: I have personally reviewed the radiological images as listed and agreed with the findings in the report. No results found.   ASSESSMENT & PLAN: Miranda Francis is a 76 y.o. female with    1. Pancreatic Cancer, stage IB c(T2, N0, M0), borderline resectable, MMR normal   -presented to PCP with jaundice for one month. Also complained of epigastric pain, poor appetite, weight loss, and light-colored stool. Found to have elevated liver enzymes and referred to ED on 01/16/22. CMP in ED showed tbili at 39.3. CT AP showed: marked biliary dilatation; soft tissue mass at pancreatic head with marked narrowing of SMV; diverticular disease of colon.  No distant metastasis on CT scan. -baseline CA 19-9 on 01/16/22 was elevated at 9,561. -MRCP abdomen MRI on 01/17/22 showed: obstructing mass within pancreatic head with low-level enhancement, causing significant extrinsic narrowing at SMV and dilatation. -ERCP on 01/18/22 by Dr. Benson Norway, FNA of pancreas head mass showed malignant cells consistent with adenocarcinoma. MMR normal. -She was seen by Dr. Michaelle Birks for felt her to be borderline resectable; she agreed with neoadjuvant chemo for 6-8 cycles then reassess -she began neoadjuvant gemcitabine alone on 02/16/22, abraxane was added with C2 (03/08/22).  She has tolerated well except moderate fatigue and nausea.  Aloxi, Decadron, and Emend premeds were added.  -her CA 19-9 initially increased > 15K but is currently trending down on treatment  -Restaging CT AP 05/08/2022 showed positive response to treatment to the pancreatic head and neck, persistent extrinsic  mass effect, and possible new small right liver lesions which were indeterminate. -We reviewed her case in tumor conference 05/23/22, the recommendation is to continue neoadjuvant chemo for now, obtain liver MRI in 1 month to further evaluate the new liver lesions, and repeat CT in 2 months.  I discussed these recommendations with the patient and her spouse and they are in agreement. -Ms. Carrigg appears stable.  She continues to tolerate treatment well with mild fatigue and constipation.  Side effects are well managed with supportive care at home.  She is able to recover and function well. -Labs reviewed, stable and adequate to proceed with cycle 6-day 1 gemcitabine and Abraxane today as planned, same dose. -She will return next week for day 8 -We will obtain liver MRI before follow-up and next cycle in 3 weeks.   2. Symptom Management: decreased appetite with weight loss, abdominal discomfort -she reports a ~40 lb weight loss over the last 4 months or so. Trying ensure, boost, and glucerna -f/up dietician  -marinol prescribed by PCP was not helpful, mirtazapine works better.  Now off both with improved appetite -She continues Zofran as needed.  Dr. Mickeal Skinner recommended to stop Compazine due to antidopaminergic effects which he felt was contributing to general dyskinesia.  She also is not taking Elavil which she prescribed for sleep and neuro side effects from chemo -Side effects are stable, well managed.  She tolerates treatment well overall  3. Obstructive jaundice -presented with hyperbilirubinemia at 39.3 -s/p stent placement on 01/18/22 -tbili normalized   4. Anemia -hgb dropped to 6.4 by 01/25/22 while in the hospital. She received blood transfusion that day, and hgb recovered to 8.4 the same day. -stable now   5. Genetic Testing -she reports colon cancer in a brother (age 67) and a sister (age  50). -she proceeded with testing on 02/07/22. Results were negative.   PLAN: -Tumor board  discussion and today's labs reviewed -Proceed with cycle 6-day 1 gem/Abraxane today as planned, same dose -Cycle 6-day 8 next week -Liver MRI prior to follow-up and next cycle in 3 weeks   Orders Placed This Encounter  Procedures   MR LIVER W WO CONTRAST    Standing Status:   Future    Standing Expiration Date:   06/01/2023    Order Specific Question:   If indicated for the ordered procedure, I authorize the administration of contrast media per Radiology protocol    Answer:   Yes    Order Specific Question:   What is the patient's sedation requirement?    Answer:   No Sedation    Order Specific Question:   Does the patient have a pacemaker or implanted devices?    Answer:   No    Order Specific Question:   Preferred imaging location?    Answer:   [facility] (table limit - 550 lbs)   All questions were answered. The patient knows to call the clinic with any problems, questions or concerns. No barriers to learning was detected. I spent 20 minutes counseling the patient face to face. The total time spent in the appointment was 30 minutes and more than 50% was on counseling and review of test results.     Alla Feeling, NP 05/31/22

## 2022-05-31 ENCOUNTER — Inpatient Hospital Stay: Payer: Medicare HMO

## 2022-05-31 ENCOUNTER — Inpatient Hospital Stay (HOSPITAL_BASED_OUTPATIENT_CLINIC_OR_DEPARTMENT_OTHER): Payer: Medicare HMO | Admitting: Nurse Practitioner

## 2022-05-31 ENCOUNTER — Other Ambulatory Visit: Payer: Self-pay

## 2022-05-31 ENCOUNTER — Encounter: Payer: Self-pay | Admitting: Nurse Practitioner

## 2022-05-31 VITALS — BP 134/79 | HR 60 | Resp 17

## 2022-05-31 VITALS — BP 143/90 | HR 67 | Temp 97.7°F | Resp 16 | Ht 63.0 in | Wt 145.4 lb

## 2022-05-31 DIAGNOSIS — C250 Malignant neoplasm of head of pancreas: Secondary | ICD-10-CM

## 2022-05-31 DIAGNOSIS — Z5111 Encounter for antineoplastic chemotherapy: Secondary | ICD-10-CM | POA: Diagnosis not present

## 2022-05-31 DIAGNOSIS — Z95828 Presence of other vascular implants and grafts: Secondary | ICD-10-CM

## 2022-05-31 LAB — COMPREHENSIVE METABOLIC PANEL
ALT: 13 U/L (ref 0–44)
AST: 16 U/L (ref 15–41)
Albumin: 3.5 g/dL (ref 3.5–5.0)
Alkaline Phosphatase: 168 U/L — ABNORMAL HIGH (ref 38–126)
Anion gap: 5 (ref 5–15)
BUN: 12 mg/dL (ref 8–23)
CO2: 25 mmol/L (ref 22–32)
Calcium: 9.1 mg/dL (ref 8.9–10.3)
Chloride: 108 mmol/L (ref 98–111)
Creatinine, Ser: 0.53 mg/dL (ref 0.44–1.00)
GFR, Estimated: >60 mL/min (ref >60)
Glucose, Bld: 215 mg/dL — ABNORMAL HIGH (ref 70–99)
Potassium: 3.7 mmol/L (ref 3.5–5.1)
Sodium: 138 mmol/L (ref 135–145)
Total Bilirubin: 0.4 mg/dL (ref 0.3–1.2)
Total Protein: 6.4 g/dL — ABNORMAL LOW (ref 6.5–8.1)

## 2022-05-31 LAB — CBC WITH DIFFERENTIAL/PLATELET
Abs Immature Granulocytes: 0.02 10*3/uL (ref 0.00–0.07)
Basophils Absolute: 0 10*3/uL (ref 0.0–0.1)
Basophils Relative: 0 %
Eosinophils Absolute: 0.1 10*3/uL (ref 0.0–0.5)
Eosinophils Relative: 1 %
HCT: 27.2 % — ABNORMAL LOW (ref 36.0–46.0)
Hemoglobin: 9.2 g/dL — ABNORMAL LOW (ref 12.0–15.0)
Immature Granulocytes: 0 %
Lymphocytes Relative: 33 %
Lymphs Abs: 2.4 10*3/uL (ref 0.7–4.0)
MCH: 31.5 pg (ref 26.0–34.0)
MCHC: 33.8 g/dL (ref 30.0–36.0)
MCV: 93.2 fL (ref 80.0–100.0)
Monocytes Absolute: 0.4 10*3/uL (ref 0.1–1.0)
Monocytes Relative: 5 %
Neutro Abs: 4.4 10*3/uL (ref 1.7–7.7)
Neutrophils Relative %: 61 %
Platelets: 262 10*3/uL (ref 150–400)
RBC: 2.92 MIL/uL — ABNORMAL LOW (ref 3.87–5.11)
RDW: 18.1 % — ABNORMAL HIGH (ref 11.5–15.5)
WBC: 7.3 10*3/uL (ref 4.0–10.5)
nRBC: 0 % (ref 0.0–0.2)

## 2022-05-31 MED ORDER — PALONOSETRON HCL INJECTION 0.25 MG/5ML
0.2500 mg | Freq: Once | INTRAVENOUS | Status: AC
  Administered 2022-05-31: 0.25 mg via INTRAVENOUS
  Filled 2022-05-31: qty 5

## 2022-05-31 MED ORDER — SODIUM CHLORIDE 0.9 % IV SOLN
10.0000 mg | Freq: Once | INTRAVENOUS | Status: AC
  Administered 2022-05-31: 10 mg via INTRAVENOUS
  Filled 2022-05-31: qty 10

## 2022-05-31 MED ORDER — SODIUM CHLORIDE 0.9% FLUSH
10.0000 mL | INTRAVENOUS | Status: DC | PRN
  Administered 2022-05-31: 10 mL

## 2022-05-31 MED ORDER — SODIUM CHLORIDE 0.9 % IV SOLN
1000.0000 mg/m2 | Freq: Once | INTRAVENOUS | Status: AC
  Administered 2022-05-31: 1786 mg via INTRAVENOUS
  Filled 2022-05-31: qty 46.97

## 2022-05-31 MED ORDER — SODIUM CHLORIDE 0.9 % IV SOLN
150.0000 mg | Freq: Once | INTRAVENOUS | Status: AC
  Administered 2022-05-31: 150 mg via INTRAVENOUS
  Filled 2022-05-31: qty 150

## 2022-05-31 MED ORDER — SODIUM CHLORIDE 0.9 % IV SOLN: Freq: Once | INTRAVENOUS | Status: AC

## 2022-05-31 MED ORDER — HEPARIN SOD (PORK) LOCK FLUSH 100 UNIT/ML IV SOLN
500.0000 [IU] | Freq: Once | INTRAVENOUS | Status: AC | PRN
  Administered 2022-05-31: 500 [IU]

## 2022-05-31 MED ORDER — SODIUM CHLORIDE 0.9% FLUSH
10.0000 mL | Freq: Once | INTRAVENOUS | Status: AC
  Administered 2022-05-31: 10 mL

## 2022-05-31 MED ORDER — PACLITAXEL PROTEIN-BOUND CHEMO INJECTION 100 MG
100.0000 mg/m2 | Freq: Once | INTRAVENOUS | Status: AC
  Administered 2022-05-31: 175 mg via INTRAVENOUS
  Filled 2022-05-31: qty 35

## 2022-05-31 NOTE — Patient Instructions (Signed)
[facility] ONCOLOGY  Discharge Instructions: Thank you for choosing [facility] to provide your oncology and hematology care.   If you have a lab appointment with the [facility], please go directly to the [facility] and check in at the registration area.   Wear comfortable clothing and clothing appropriate for easy access to any Portacath or PICC line.   We strive to give you quality time with your provider. You may need to reschedule your appointment if you arrive late (15 or more minutes).  Arriving late affects you and other patients whose appointments are after yours.  Also, if you miss three or more appointments without notifying the office, you may be dismissed from the clinic at the provider's discretion.      For prescription refill requests, have your pharmacy contact our office and allow 72 hours for refills to be completed.    Today you received the following chemotherapy and/or immunotherapy agents : Abraxane, Gemzar      To help prevent nausea and vomiting after your treatment, we encourage you to take your nausea medication as directed.  BELOW ARE SYMPTOMS THAT SHOULD BE REPORTED IMMEDIATELY: *FEVER GREATER THAN 100.4 F (38 C) OR HIGHER *CHILLS OR SWEATING *NAUSEA AND VOMITING THAT IS NOT CONTROLLED WITH YOUR NAUSEA MEDICATION *UNUSUAL SHORTNESS OF BREATH *UNUSUAL BRUISING OR BLEEDING *URINARY PROBLEMS (pain or burning when urinating, or frequent urination) *BOWEL PROBLEMS (unusual diarrhea, constipation, pain near the anus) TENDERNESS IN MOUTH AND THROAT WITH OR WITHOUT PRESENCE OF ULCERS (sore throat, sores in mouth, or a toothache) UNUSUAL RASH, SWELLING OR PAIN  UNUSUAL VAGINAL DISCHARGE OR ITCHING   Items with * indicate a potential emergency and should be followed up as soon as possible or go to the Emergency Department if any problems should occur.  Please show the CHEMOTHERAPY ALERT CARD or IMMUNOTHERAPY ALERT CARD at  check-in to the Emergency Department and triage nurse.  Should you have questions after your visit or need to cancel or reschedule your appointment, please contact [facility]  Dept: [phone]  and follow the prompts.  Office hours are 8:00 a.m. to 4:30 p.m. Monday - Friday. Please note that voicemails left after 4:00 p.m. may not be returned until the following business day.  We are closed weekends and major holidays. You have access to a nurse at all times for urgent questions. Please call the main number to the clinic Dept: [phone] and follow the prompts.   For any non-urgent questions, you may also contact your provider using MyChart. We now offer e-Visits for anyone 75 and older to request care online for non-urgent symptoms. For details visit [email or web address].   Also download the MyChart app! Go to the app store, search "MyChart", open the app, select [facility], and log in with your MyChart username and password.  Masks are optional in the cancer centers. If you would like for your care team to wear a mask while they are taking care of you, please let them know. For doctor visits, patients may have with them one support person who is at least 75 years old. At this time, visitors are not allowed in the infusion area.

## 2022-06-01 ENCOUNTER — Telehealth: Payer: Self-pay | Admitting: Hematology

## 2022-06-01 ENCOUNTER — Encounter: Payer: Self-pay | Admitting: Hematology

## 2022-06-01 NOTE — Progress Notes (Signed)
Patient called after receiving my card.  Discussed [employer] we discussed back in April and patient states she does not qualify.  She had a question regarding a payment she made. Referred patient to contact billing department. She verbalized understanding.  She has my card for any additional financial questions or concerns.

## 2022-06-01 NOTE — Telephone Encounter (Signed)
Scheduled follow-up appointments per 7/20 los. Patient is aware.

## 2022-06-04 ENCOUNTER — Other Ambulatory Visit: Payer: Self-pay

## 2022-06-06 MED FILL — Fosaprepitant Dimeglumine For IV Infusion 150 MG (Base Eq): INTRAVENOUS | Qty: 5 | Status: AC

## 2022-06-06 MED FILL — Dexamethasone Sodium Phosphate Inj 100 MG/10ML: INTRAMUSCULAR | Qty: 1 | Status: AC

## 2022-06-07 ENCOUNTER — Other Ambulatory Visit: Payer: Self-pay

## 2022-06-07 ENCOUNTER — Inpatient Hospital Stay: Payer: Medicare HMO

## 2022-06-07 VITALS — BP 145/81 | HR 70 | Temp 97.9°F | Resp 18 | Wt 141.4 lb

## 2022-06-07 DIAGNOSIS — C250 Malignant neoplasm of head of pancreas: Secondary | ICD-10-CM

## 2022-06-07 DIAGNOSIS — Z5111 Encounter for antineoplastic chemotherapy: Secondary | ICD-10-CM | POA: Diagnosis not present

## 2022-06-07 DIAGNOSIS — Z95828 Presence of other vascular implants and grafts: Secondary | ICD-10-CM

## 2022-06-07 LAB — CMP (CANCER CENTER ONLY)
ALT: 13 U/L (ref 0–44)
AST: 14 U/L — ABNORMAL LOW (ref 15–41)
Albumin: 3.6 g/dL (ref 3.5–5.0)
Alkaline Phosphatase: 152 U/L — ABNORMAL HIGH (ref 38–126)
Anion gap: 5 (ref 5–15)
BUN: 9 mg/dL (ref 8–23)
CO2: 26 mmol/L (ref 22–32)
Calcium: 8.8 mg/dL — ABNORMAL LOW (ref 8.9–10.3)
Chloride: 107 mmol/L (ref 98–111)
Creatinine: 0.56 mg/dL (ref 0.44–1.00)
GFR, Estimated: >60 mL/min (ref >60)
Glucose, Bld: 187 mg/dL — ABNORMAL HIGH (ref 70–99)
Potassium: 3.9 mmol/L (ref 3.5–5.1)
Sodium: 138 mmol/L (ref 135–145)
Total Bilirubin: 0.3 mg/dL (ref 0.3–1.2)
Total Protein: 6.5 g/dL (ref 6.5–8.1)

## 2022-06-07 LAB — CBC WITH DIFFERENTIAL (CANCER CENTER ONLY)
Abs Immature Granulocytes: 0.09 10*3/uL — ABNORMAL HIGH (ref 0.00–0.07)
Basophils Absolute: 0.1 10*3/uL (ref 0.0–0.1)
Basophils Relative: 1 %
Eosinophils Absolute: 0 10*3/uL (ref 0.0–0.5)
Eosinophils Relative: 1 %
HCT: 25.9 % — ABNORMAL LOW (ref 36.0–46.0)
Hemoglobin: 8.6 g/dL — ABNORMAL LOW (ref 12.0–15.0)
Immature Granulocytes: 2 %
Lymphocytes Relative: 58 %
Lymphs Abs: 2.7 10*3/uL (ref 0.7–4.0)
MCH: 30.5 pg (ref 26.0–34.0)
MCHC: 33.2 g/dL (ref 30.0–36.0)
MCV: 91.8 fL (ref 80.0–100.0)
Monocytes Absolute: 0.2 10*3/uL (ref 0.1–1.0)
Monocytes Relative: 4 %
Neutro Abs: 1.6 10*3/uL — ABNORMAL LOW (ref 1.7–7.7)
Neutrophils Relative %: 34 %
Platelet Count: 239 10*3/uL (ref 150–400)
RBC: 2.82 MIL/uL — ABNORMAL LOW (ref 3.87–5.11)
RDW: 17.5 % — ABNORMAL HIGH (ref 11.5–15.5)
WBC Count: 4.7 10*3/uL (ref 4.0–10.5)
nRBC: 0.8 % — ABNORMAL HIGH (ref 0.0–0.2)

## 2022-06-07 MED ORDER — PACLITAXEL PROTEIN-BOUND CHEMO INJECTION 100 MG
100.0000 mg/m2 | Freq: Once | INTRAVENOUS | Status: AC
  Administered 2022-06-07: 175 mg via INTRAVENOUS
  Filled 2022-06-07: qty 35

## 2022-06-07 MED ORDER — PALONOSETRON HCL INJECTION 0.25 MG/5ML
0.2500 mg | Freq: Once | INTRAVENOUS | Status: AC
  Administered 2022-06-07: 0.25 mg via INTRAVENOUS
  Filled 2022-06-07: qty 5

## 2022-06-07 MED ORDER — SODIUM CHLORIDE 0.9 % IV SOLN: Freq: Once | INTRAVENOUS | Status: AC

## 2022-06-07 MED ORDER — SODIUM CHLORIDE 0.9 % IV SOLN
1000.0000 mg/m2 | Freq: Once | INTRAVENOUS | Status: AC
  Administered 2022-06-07: 1786 mg via INTRAVENOUS
  Filled 2022-06-07: qty 46.97

## 2022-06-07 MED ORDER — SODIUM CHLORIDE 0.9 % IV SOLN
150.0000 mg | Freq: Once | INTRAVENOUS | Status: AC
  Administered 2022-06-07: 150 mg via INTRAVENOUS
  Filled 2022-06-07: qty 150

## 2022-06-07 MED ORDER — SODIUM CHLORIDE 0.9% FLUSH
10.0000 mL | Freq: Once | INTRAVENOUS | Status: AC
  Administered 2022-06-07: 10 mL

## 2022-06-07 MED ORDER — SODIUM CHLORIDE 0.9 % IV SOLN
10.0000 mg | Freq: Once | INTRAVENOUS | Status: AC
  Administered 2022-06-07: 10 mg via INTRAVENOUS
  Filled 2022-06-07: qty 10

## 2022-06-07 MED ORDER — HEPARIN SOD (PORK) LOCK FLUSH 100 UNIT/ML IV SOLN
500.0000 [IU] | Freq: Once | INTRAVENOUS | Status: AC | PRN
  Administered 2022-06-07: 500 [IU]

## 2022-06-07 MED ORDER — SODIUM CHLORIDE 0.9% FLUSH
10.0000 mL | INTRAVENOUS | Status: DC | PRN
  Administered 2022-06-07: 10 mL

## 2022-06-07 NOTE — Patient Instructions (Signed)
[facility] ONCOLOGY  Discharge Instructions: Thank you for choosing [facility] to provide your oncology and hematology care.   If you have a lab appointment with the [facility], please go directly to the [facility] and check in at the registration area.   Wear comfortable clothing and clothing appropriate for easy access to any Portacath or PICC line.   We strive to give you quality time with your provider. You may need to reschedule your appointment if you arrive late (15 or more minutes).  Arriving late affects you and other patients whose appointments are after yours.  Also, if you miss three or more appointments without notifying the office, you may be dismissed from the clinic at the provider's discretion.      For prescription refill requests, have your pharmacy contact our office and allow 72 hours for refills to be completed.    Today you received the following chemotherapy and/or immunotherapy agents: Abraxane, Gemcitabine.       To help prevent nausea and vomiting after your treatment, we encourage you to take your nausea medication as directed.  BELOW ARE SYMPTOMS THAT SHOULD BE REPORTED IMMEDIATELY: *FEVER GREATER THAN 100.4 F (38 C) OR HIGHER *CHILLS OR SWEATING *NAUSEA AND VOMITING THAT IS NOT CONTROLLED WITH YOUR NAUSEA MEDICATION *UNUSUAL SHORTNESS OF BREATH *UNUSUAL BRUISING OR BLEEDING *URINARY PROBLEMS (pain or burning when urinating, or frequent urination) *BOWEL PROBLEMS (unusual diarrhea, constipation, pain near the anus) TENDERNESS IN MOUTH AND THROAT WITH OR WITHOUT PRESENCE OF ULCERS (sore throat, sores in mouth, or a toothache) UNUSUAL RASH, SWELLING OR PAIN  UNUSUAL VAGINAL DISCHARGE OR ITCHING   Items with * indicate a potential emergency and should be followed up as soon as possible or go to the Emergency Department if any problems should occur.  Please show the CHEMOTHERAPY ALERT CARD or IMMUNOTHERAPY ALERT CARD  at check-in to the Emergency Department and triage nurse.  Should you have questions after your visit or need to cancel or reschedule your appointment, please contact [facility]  Dept: [phone]  and follow the prompts.  Office hours are 8:00 a.m. to 4:30 p.m. Monday - Friday. Please note that voicemails left after 4:00 p.m. may not be returned until the following business day.  We are closed weekends and major holidays. You have access to a nurse at all times for urgent questions. Please call the main number to the clinic Dept: [phone] and follow the prompts.   For any non-urgent questions, you may also contact your provider using MyChart. We now offer e-Visits for anyone 21 and older to request care online for non-urgent symptoms. For details visit [email or web address].   Also download the MyChart app! Go to the app store, search "MyChart", open the app, select [facility], and log in with your MyChart username and password.  Masks are optional in the cancer centers. If you would like for your care team to wear a mask while they are taking care of you, please let them know. For doctor visits, patients may have with them one support person who is at least 75 years old. At this time, visitors are not allowed in the infusion area.

## 2022-06-08 LAB — CANCER ANTIGEN 19-9: CA 19-9: 2503 U/mL — ABNORMAL HIGH (ref 0–35)

## 2022-06-15 ENCOUNTER — Other Ambulatory Visit: Payer: Self-pay

## 2022-06-19 ENCOUNTER — Other Ambulatory Visit: Payer: Self-pay | Admitting: Nurse Practitioner

## 2022-06-19 ENCOUNTER — Ambulatory Visit (HOSPITAL_COMMUNITY)
Admission: RE | Admit: 2022-06-19 | Discharge: 2022-06-19 | Disposition: A | Discharge status: Home / Self Care | Payer: Medicare HMO | Source: Ambulatory Visit | Attending: Nurse Practitioner | Admitting: Nurse Practitioner

## 2022-06-19 DIAGNOSIS — C250 Malignant neoplasm of head of pancreas: Secondary | ICD-10-CM | POA: Diagnosis present

## 2022-06-19 MED ORDER — GADOBUTROL 1 MMOL/ML IV SOLN
6.0000 mL | Freq: Once | INTRAVENOUS | Status: AC | PRN
  Administered 2022-06-19: 6 mL via INTRAVENOUS

## 2022-06-20 MED FILL — Dexamethasone Sodium Phosphate Inj 100 MG/10ML: INTRAMUSCULAR | Qty: 1 | Status: AC

## 2022-06-20 MED FILL — Fosaprepitant Dimeglumine For IV Infusion 150 MG (Base Eq): INTRAVENOUS | Qty: 5 | Status: AC

## 2022-06-21 ENCOUNTER — Encounter: Payer: Self-pay | Admitting: Hematology

## 2022-06-21 ENCOUNTER — Other Ambulatory Visit (HOSPITAL_BASED_OUTPATIENT_CLINIC_OR_DEPARTMENT_OTHER): Payer: Self-pay

## 2022-06-21 ENCOUNTER — Inpatient Hospital Stay: Payer: Medicare HMO

## 2022-06-21 ENCOUNTER — Inpatient Hospital Stay (HOSPITAL_BASED_OUTPATIENT_CLINIC_OR_DEPARTMENT_OTHER): Payer: Medicare HMO | Admitting: Hematology

## 2022-06-21 ENCOUNTER — Inpatient Hospital Stay: Payer: Medicare HMO | Admitting: Nutrition

## 2022-06-21 ENCOUNTER — Inpatient Hospital Stay: Payer: Medicare HMO | Attending: Physician Assistant

## 2022-06-21 ENCOUNTER — Other Ambulatory Visit: Payer: Self-pay

## 2022-06-21 VITALS — BP 156/92 | HR 93 | Temp 98.7°F | Resp 15 | Wt 143.8 lb

## 2022-06-21 DIAGNOSIS — C250 Malignant neoplasm of head of pancreas: Secondary | ICD-10-CM | POA: Diagnosis present

## 2022-06-21 DIAGNOSIS — Z5111 Encounter for antineoplastic chemotherapy: Secondary | ICD-10-CM | POA: Insufficient documentation

## 2022-06-21 DIAGNOSIS — D649 Anemia, unspecified: Secondary | ICD-10-CM | POA: Diagnosis not present

## 2022-06-21 DIAGNOSIS — E1165 Type 2 diabetes mellitus with hyperglycemia: Secondary | ICD-10-CM | POA: Insufficient documentation

## 2022-06-21 DIAGNOSIS — Z79899 Other long term (current) drug therapy: Secondary | ICD-10-CM | POA: Insufficient documentation

## 2022-06-21 DIAGNOSIS — Z95828 Presence of other vascular implants and grafts: Secondary | ICD-10-CM

## 2022-06-21 DIAGNOSIS — K831 Obstruction of bile duct: Secondary | ICD-10-CM | POA: Diagnosis not present

## 2022-06-21 LAB — CMP (CANCER CENTER ONLY)
ALT: 12 U/L (ref 0–44)
AST: 16 U/L (ref 15–41)
Albumin: 3.7 g/dL (ref 3.5–5.0)
Alkaline Phosphatase: 127 U/L — ABNORMAL HIGH (ref 38–126)
Anion gap: 5 (ref 5–15)
BUN: 11 mg/dL (ref 8–23)
CO2: 26 mmol/L (ref 22–32)
Calcium: 8.9 mg/dL (ref 8.9–10.3)
Chloride: 108 mmol/L (ref 98–111)
Creatinine: 0.55 mg/dL (ref 0.44–1.00)
GFR, Estimated: >60 mL/min (ref >60)
Glucose, Bld: 247 mg/dL — ABNORMAL HIGH (ref 70–99)
Potassium: 3.7 mmol/L (ref 3.5–5.1)
Sodium: 139 mmol/L (ref 135–145)
Total Bilirubin: 0.3 mg/dL (ref 0.3–1.2)
Total Protein: 6.7 g/dL (ref 6.5–8.1)

## 2022-06-21 LAB — CBC WITH DIFFERENTIAL (CANCER CENTER ONLY)
Abs Immature Granulocytes: 0.02 10*3/uL (ref 0.00–0.07)
Basophils Absolute: 0 10*3/uL (ref 0.0–0.1)
Basophils Relative: 1 %
Eosinophils Absolute: 0.1 10*3/uL (ref 0.0–0.5)
Eosinophils Relative: 1 %
HCT: 28.8 % — ABNORMAL LOW (ref 36.0–46.0)
Hemoglobin: 9.4 g/dL — ABNORMAL LOW (ref 12.0–15.0)
Immature Granulocytes: 0 %
Lymphocytes Relative: 38 %
Lymphs Abs: 2.2 10*3/uL (ref 0.7–4.0)
MCH: 30.9 pg (ref 26.0–34.0)
MCHC: 32.6 g/dL (ref 30.0–36.0)
MCV: 94.7 fL (ref 80.0–100.0)
Monocytes Absolute: 0.3 10*3/uL (ref 0.1–1.0)
Monocytes Relative: 6 %
Neutro Abs: 3.1 10*3/uL (ref 1.7–7.7)
Neutrophils Relative %: 54 %
Platelet Count: 249 10*3/uL (ref 150–400)
RBC: 3.04 MIL/uL — ABNORMAL LOW (ref 3.87–5.11)
RDW: 19.6 % — ABNORMAL HIGH (ref 11.5–15.5)
WBC Count: 5.8 10*3/uL (ref 4.0–10.5)
nRBC: 0 % (ref 0.0–0.2)

## 2022-06-21 MED ORDER — SODIUM CHLORIDE 0.9 % IV SOLN
150.0000 mg | Freq: Once | INTRAVENOUS | Status: AC
  Administered 2022-06-21: 150 mg via INTRAVENOUS
  Filled 2022-06-21: qty 150

## 2022-06-21 MED ORDER — SODIUM CHLORIDE 0.9 % IV SOLN
10.0000 mg | Freq: Once | INTRAVENOUS | Status: AC
  Administered 2022-06-21: 10 mg via INTRAVENOUS
  Filled 2022-06-21: qty 10

## 2022-06-21 MED ORDER — PALONOSETRON HCL INJECTION 0.25 MG/5ML
INTRAVENOUS | Status: AC
  Filled 2022-06-21: qty 5

## 2022-06-21 MED ORDER — SODIUM CHLORIDE 0.9 % IV SOLN: Freq: Once | INTRAVENOUS | Status: AC

## 2022-06-21 MED ORDER — HEPARIN SOD (PORK) LOCK FLUSH 100 UNIT/ML IV SOLN
500.0000 [IU] | Freq: Once | INTRAVENOUS | Status: AC | PRN
  Administered 2022-06-21: 500 [IU]

## 2022-06-21 MED ORDER — PALONOSETRON HCL INJECTION 0.25 MG/5ML
0.2500 mg | Freq: Once | INTRAVENOUS | Status: AC
  Administered 2022-06-21: 0.25 mg via INTRAVENOUS

## 2022-06-21 MED ORDER — PACLITAXEL PROTEIN-BOUND CHEMO INJECTION 100 MG
100.0000 mg/m2 | Freq: Once | INTRAVENOUS | Status: AC
  Administered 2022-06-21: 175 mg via INTRAVENOUS
  Filled 2022-06-21: qty 35

## 2022-06-21 MED ORDER — GABAPENTIN 100 MG PO CAPS
100.0000 mg | ORAL_CAPSULE | Freq: Every day | ORAL | 0 refills | Status: DC
  Filled 2022-06-21: qty 42, 28d supply, fill #0

## 2022-06-21 MED ORDER — SODIUM CHLORIDE 0.9 % IV SOLN
1000.0000 mg/m2 | Freq: Once | INTRAVENOUS | Status: AC
  Administered 2022-06-21: 1786 mg via INTRAVENOUS
  Filled 2022-06-21: qty 46.97

## 2022-06-21 MED ORDER — SODIUM CHLORIDE 0.9% FLUSH
10.0000 mL | Freq: Once | INTRAVENOUS | Status: AC
  Administered 2022-06-21: 10 mL

## 2022-06-21 MED ORDER — SODIUM CHLORIDE 0.9% FLUSH
10.0000 mL | INTRAVENOUS | Status: DC | PRN
  Administered 2022-06-21: 10 mL

## 2022-06-21 NOTE — Progress Notes (Signed)
Nutrition follow up completed with patient during infusion for pancreas cancer.  Weight documented as 143 pounds 12.8 oz today improved from 141 pounds 6 oz June 27.  Labs include Glucose 247.  Patient reports increased appetite. Concerned with blood sugar after eating breakfast and taking insulin. Likes chocolate ensure and is not fond of vanilla flavor.  Nutrition Diagnosis: Food and Nutrition Related Knowledge Deficit improved  Intervention: Educated on breakfast options to help with glycemic control. Encouraged her to track BS and discuss with MD as needed. Provided coupons for Ensure and educated on tips to improve flavor.  Monitoring, Evaluation, Goals: Patient will tolerate adequate calories and protein to maintain wt and achieve adequate glycemic control.  Next Visit: Patient will contact RD for needs.

## 2022-06-21 NOTE — Progress Notes (Signed)
Miranda Francis   [phone] [phone]   Clinic Follow up Note   Patient Care Team: [name], MD as PCP - General (Internal Medicine) Truitt Merle, MD as Consulting Physician (Oncology) Royston Bake, RN as Oncology Nurse Navigator (Oncology)  Date of Service:  06/21/2022  CHIEF COMPLAINT: f/u of pancreatic cancer  CURRENT THERAPY:  Neoadjuvant Gemcitabine, days 1 and 8 q21d, starting 02/16/22 -Abraxane added with C2 (03/08/22)  ASSESSMENT & PLAN:  Miranda Francis is a 76 y.o. female with   1. Pancreatic Cancer, stage IB c(T2, N0, Mx) with indeterminate liver lesions, borderline resectable, MMR normal   -presented to PCP with jaundice for one month. Also complained of epigastric pain, poor appetite, weight loss, and light-colored stool. Found to have elevated liver enzymes and referred to ED on 01/16/22. CMP in ED showed tbili at 39.3. CT AP showed: marked biliary dilatation; soft tissue mass at pancreatic head with marked narrowing of SMV; diverticular disease of colon.  No distant metastasis on CT scan. -baseline CA 19-9 on 01/16/22 was elevated at 9,561. -MRCP abdomen MRI on 01/17/22 showed: obstructing mass within pancreatic head with low-level enhancement, causing significant extrinsic narrowing at SMV and dilatation. -ERCP on 01/18/22 by Dr. Benson Norway, FNA of pancreas head mass showed malignant cells consistent with adenocarcinoma. MMR normal. -she began neoadjuvant gemcitabine alone on 02/16/22, abraxane was added with C2 (03/08/22).  She is tolerating well overall, but still has moderate fatigue, not physically active.  -her CA 19-9 has dropped significantly since she started chemo treatment -restaging CT AP on 05/08/22 showed: positive response to treatment to pancreatic head and neck; persistent extrinsic mass effect; possible new small right liver lesions. F/u abdomen MRI on 06/19/22 showed: subtle liver lesions are stable from MRI in 01/2022 (not reported  then); no change in pancreatic mass.  I personally reviewed her scan images and discussed the findings with patient and her husband. Her liver lesions are indeterminate, however given her initial CA 19.9 being above 50,000, this is very concerning for metastatic disease.  -her CA 19-9 is stable now, most recently 2,503 on 06/07/22 -I reviewed the MRI and CA 19-9 results with them today. I discussed that there is still concern that the liver spots are cancerous, especially given her significantly elevated CA 19-9 at diagnosis. I plan to present her case to our tumor conference again with the new MRI results. For now, we will continue her current treatment. -she is tolerating treatment well overall. Labs reviewed, overall stable to improved. Adequate to continue treatment.   2. Symptom Management: decreased appetite with weight loss, numbness -she reports a ~40 lb weight loss over 4 months or so prior to diagnosis. She endorses supplementing with Ensure and Glucerna. She is taking mirtazapine -she is followed by dietician -she reports new numbness in her toes that keeps her up at night. I will call in gabapentin for her to try.   3. Obstructive jaundice -presented with hyperbilirubinemia at 39.3 -s/p stent placement on 01/18/22 -tbili now WNL.   4. Anemia -hgb dropped to 6.4 by 01/25/22 while in the hospital. She received blood transfusion that day, and hgb recovered to 8.4 the same day. -hgb stable at 9.4 today (06/21/22)   5. Genetic Testing -she reports colon cancer in a brother (age 39) and a sister (age 13). -she proceeded with testing on 02/07/22. Results were negative.   6. DM, Hyperglycemia -previously on glipizide and metformin -BG has gone up with steroids/chemo, so her  PCP started her on insulin.   7. New mouth tremor  -she report new onset mouth tremor in the past months, no other neurological symptoms -Dr. Mickeal Skinner started her on amitriptyline, which has controlled her tremor but  causes some more nausea with chemo. I advised her to try zofran.    PLAN: -lab and scan reviewed, I will review in GI conference and discuss with Dr. Zenia Resides  -proceed with C7 gem/Abraxane today and in 1 week -lab, flush, and gem/abraxane in 3 and 4 weeks             -she prefers Thursday late morning or later -f/u in 3 weeks   No problem-specific Assessment & Plan notes found for this encounter.   SUMMARY OF ONCOLOGIC HISTORY: Oncology History Overview Note   Cancer Staging  Pancreatic cancer [facility]) Staging form: Exocrine Pancreas, AJCC 8th Edition - Clinical stage from 01/18/2022: Stage IB (cT2, cN0, cM0) - Signed by Truitt Merle, MD on 01/28/2022    Pancreatic cancer ([place])  01/16/2022 Imaging   CLINICAL DATA:  Jaundice   EXAM: CT ABDOMEN AND PELVIS WITH CONTRAST  IMPRESSION: 1. Marked intra and extrahepatic biliary dilatation with marked dilatation of pancreatic duct and distended gallbladder. There is significant atrophy of the body and tail of pancreas. 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 2. Diverticular disease of the colon without acute wall thickening 3. Mild endometrial thickening up to 9 mm, recommend nonemergent pelvic ultrasound for further evaluation   01/17/2022 Imaging   EXAM: MRI ABDOMEN WITHOUT AND WITH CONTRAST (INCLUDING MRCP)  IMPRESSION: 1. Obstructing mass within the pancreatic head with an intraluminal component demonstrating low level enhancement. 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 2. This mass causes significant extrinsic narrowing at the portal/superior mesenteric vein confluence as well as marked intra and extrahepatic biliary  dilatation. 3. No distant metastases identified. 4. Endoscopy for endoscopic ultrasound, tissue sampling and biliary decompression recommended.   01/18/2022 Cancer Staging   Staging form: Exocrine Pancreas, AJCC 8th Edition - Clinical stage from 01/18/2022: Stage IB (cT2, cN0, cM0) - Signed by Truitt Merle, MD on 01/28/2022 Stage prefix: Initial diagnosis Total positive nodes: 0   01/18/2022 Initial Biopsy   FINAL MICROSCOPIC DIAGNOSIS:  A. PANCREAS, HEAD, FINE NEEDLE ASPIRATION:  - Malignant cells present consistent with adenocarcinoma   01/24/2022 Imaging   EXAM: CT CHEST WITHOUT CONTRAST  IMPRESSION: 1. No evidence of metastatic disease in the chest. 2. Atelectasis or consolidation in the right lower lung. 3. Healing right rib fractures. 4. Mass in the head of the pancreas. See previous report of CT abdomen and pelvis 01/16/2022. Interval placement of the biliary stent.   01/28/2022 Initial Diagnosis   Pancreatic cancer ([place])   02/16/2022 -  Chemotherapy   Patient is on Treatment Plan : PANCREATIC Abraxane / Gemcitabine D1,8 q21d     02/16/2022 Genetic Testing   Negative genetic testing on the CancerNext-Expanded+RNAinsight panel.  The report date is February 16, 2022.  The CancerNext-Expanded gene panel offered by [facility] and includes sequencing and rearrangement analysis for the following 77 genes: AIP, ALK, APC*, ATM*, AXIN2, BAP1, BARD1, BLM, BMPR1A, BRCA1*, BRCA2*, BRIP1*, CDC73, CDH1*, CDK4, CDKN1B, CDKN2A, CHEK2*, CTNNA1, DICER1, FANCC, FH, FLCN, GALNT12, KIF1B, LZTR1, MAX, MEN1, MET, MLH1*, MSH2*,  MSH3, MSH6*, MUTYH*, NBN, NF1*, NF2, NTHL1, PALB2*, PHOX2B, PMS2*, POT1, PRKAR1A, PTCH1, PTEN*, RAD51C*, RAD51D*, RB1, RECQL, RET, SDHA, SDHAF2, SDHB, SDHC, SDHD, SMAD4, SMARCA4, SMARCB1, SMARCE1, STK11, SUFU, TMEM127, TP53*, TSC1, TSC2, VHL and XRCC2 (sequencing and deletion/duplication); EGFR, EGLN1, HOXB13, KIT, MITF, PDGFRA, POLD1, and POLE (sequencing only); EPCAM and GREM1  (deletion/duplication only). DNA and RNA analyses performed for * genes.       INTERVAL HISTORY:  Miranda Francis is here for a follow up of panreatic cancer. She was last seen by NP Lacie on 05/31/22. She presents to the clinic accompanied by her husband. She reports she is feeling well. She notes she continues to feel better overall on treatment. She reports she is eating much better but notes some difficulty with certain meats. She endorses supplementing with Ensure/Glucerna twice a day. She reports numbness in her toes that wakes her up at night.   All other systems were reviewed with the patient and are negative.  MEDICAL HISTORY:  Past Medical History:  Diagnosis Date   Blood transfusion    Diabetes mellitus    Family history of breast cancer    Family history of colon cancer    Family history of prostate cancer    Heart murmur    Hypertension     SURGICAL HISTORY: Past Surgical History:  Procedure Laterality Date   BILIARY STENT PLACEMENT  01/18/2022   Procedure: BILIARY STENT PLACEMENT;  Surgeon: Carol Ada, MD;  Location: [place];  Service: Gastroenterology;;   DILATION AND CURETTAGE OF UTERUS     ERCP N/A 01/18/2022   Procedure: ENDOSCOPIC RETROGRADE CHOLANGIOPANCREATOGRAPHY (ERCP);  Surgeon: Carol Ada, MD;  Location: [place];  Service: Gastroenterology;  Laterality: N/A;   ESOPHAGOGASTRODUODENOSCOPY N/A 01/18/2022   Procedure: ESOPHAGOGASTRODUODENOSCOPY (EGD);  Surgeon: Carol Ada, MD;  Location: [place];  Service: Gastroenterology;  Laterality: N/A;   EUS  01/18/2022   Procedure: UPPER ENDOSCOPIC ULTRASOUND (EUS) LINEAR;  Surgeon: Carol Ada, MD;  Location[facility] ENDOSCOPY;  Service: Gastroenterology;;   FINE NEEDLE ASPIRATION  01/18/2022   Procedure: FINE NEEDLE ASPIRATION (FNA) LINEAR;  Surgeon: Carol Ada, MD;  Location: [facility] ENDOSCOPY;  Service: Gastroenterology;;   IR IMAGING GUIDED PORT INSERTION  02/15/2022   NO PAST SURGERIES     SPHINCTEROTOMY   01/18/2022   Procedure: Joan Mayans;  Surgeon: Carol Ada, MD;  Location: [place];  Service: Gastroenterology;;    I have reviewed the social history and family history with the patient and they are unchanged from previous note.  ALLERGIES:  has No Known Allergies.  MEDICATIONS:  Current Outpatient Medications  Medication Sig Dispense Refill   gabapentin (NEURONTIN) 100 MG capsule Take 1 capsule (100 mg total) by mouth at bedtime. OK to increase to 2 capsules at night in a few weeks 60 capsule 0   acetaminophen (TYLENOL) 500 MG tablet Take 1,000 mg by mouth every 6 (six) hours as needed for moderate pain or headache.     amLODipine (NORVASC) 5 MG tablet Take 5 mg by mouth daily.     Cholecalciferol (QC VITAMIN D3) 50 MCG (2000 UT) TABS Take 2,000 Units by mouth daily.     KLOR-CON M20 20 MEQ tablet Take 20 mEq by mouth 2 (two) times daily.     lidocaine-prilocaine (EMLA) cream Apply to affected area once (Patient not taking: Reported on 05/11/2022) 30 g 3   NOVOLOG FLEXPEN 100 UNIT/ML FlexPen Inject 15 Units into the skin 2 (two) times daily.     ondansetron (ZOFRAN) 8 MG  tablet Take 1 tablet (8 mg total) by mouth 2 (two) times daily as needed (Nausea or vomiting). 30 tablet 1   oxyCODONE (OXY IR/ROXICODONE) 5 MG immediate release tablet Take 1 tablet (5 mg total) by mouth every 3 (three) hours as needed for severe pain. 20 tablet 0   polyethylene glycol (MIRALAX / GLYCOLAX) 17 g packet Take 17 g by mouth daily. 14 each 0   No current facility-administered medications for this visit.   Facility-Administered Medications Ordered in Other Visits  Medication Dose Route Frequency Provider Last Rate Last Admin   sodium chloride flush (NS) 0.9 % injection 10 mL  10 mL Intracatheter PRN Truitt Merle, MD   10 mL at 04/26/22 1636    PHYSICAL EXAMINATION: ECOG PERFORMANCE STATUS: 1 - Symptomatic but completely ambulatory  Vitals:   06/21/22 1045  BP: (!) 156/92  Pulse: 93  Resp: 15   Temp: 98.7 F (37.1 C)  SpO2: 100%   Wt Readings from Last 3 Encounters:  06/21/22 143 lb 12.8 oz (65.2 kg)  06/07/22 141 lb 6.4 oz (64.1 kg)  05/31/22 145 lb 6.4 oz (66 kg)     GENERAL:alert, no distress and comfortable SKIN: skin color normal, no rashes or significant lesions EYES: normal, Conjunctiva are pink and non-injected, sclera clear  NEURO: alert & oriented x 3 with fluent speech  LABORATORY DATA:  I have reviewed the data as listed    Latest Ref Rng & Units 06/21/2022    9:50 AM 06/07/2022    1:11 PM 05/31/2022    9:05 AM  CBC  WBC 4.0 - 10.5 K/uL 5.8  4.7  7.3   Hemoglobin 12.0 - 15.0 g/dL 9.4  8.6  9.2   Hematocrit 36.0 - 46.0 % 28.8  25.9  27.2   Platelets 150 - 400 K/uL 249  239  262         Latest Ref Rng & Units 06/21/2022    9:50 AM 06/07/2022    1:11 PM 05/31/2022    9:05 AM  CMP  Glucose 70 - 99 mg/dL 247  187  215   BUN 8 - 23 mg/dL _0 Creatinine 0.44 - 1.00 mg/dL 0.55  0.56  0.53   Sodium 135 - 145 mmol/L 139  138  138   Potassium 3.5 - 5.1 mmol/L 3.7  3.9  3.7   Chloride 98 - 111 mmol/L 108  107  108   CO2 22 - 32 mmol/L _1 Calcium 8.9 - 10.3 mg/dL 8.9  8.8  9.1   Total Protein 6.5 - 8.1 g/dL 6.7  6.5  6.4   Total Bilirubin 0.3 - 1.2 mg/dL 0.3  0.3  0.4   Alkaline Phos 38 - 126 U/L 127  152  168   AST 15 - 41 U/L _2 ALT 0 - 44 U/L _3 RADIOGRAPHIC STUDIES: I have personally reviewed the radiological images as listed and agreed with the findings in the report. No results found.    No orders of the defined types were placed in this encounter.  All questions were answered. The patient knows to call the clinic with any problems, questions or concerns. No barriers to learning was detected. The total time spent in the appointment was 30 minutes.     Truitt Merle, MD 06/21/2022   I, Wilburn Mylar, am acting  as scribe for Truitt Merle, MD.   I have reviewed the above documentation for accuracy and  completeness, and I agree with the above.

## 2022-06-21 NOTE — Patient Instructions (Signed)
[facility]COL[facility]  Discharge Instructions: Thank you for choosing [facility] to provide your oncology and hematology care.   If you have a lab appointment with the [facility], please go directly to the [facility] and check in at the registration area.   Wear comfortable clothing and clothing appropriate for easy access to any Portacath or PICC line.   We strive to give you quality time with your provider. You may need to reschedule your appointment if you arrive late (15 or more minutes).  Arriving late affects you and other patients whose appointments are after yours.  Also, if you miss three or more appointments without notifying the office, you may be dismissed from the clinic at the provider's discretion.      For prescription refill requests, have your pharmacy contact our office and allow 72 hours for refills to be completed.    Today you received the following chemotherapy and/or immunotherapy agents : Abraxane, Gemzar      To help prevent nausea and vomiting after your treatment, we encourage you to take your nausea medication as directed.  BELOW ARE SYMPTOMS THAT SHOULD BE REPORTED IMMEDIATELY: *FEVER GREATER THAN 100.4 F (38 C) OR HIGHER *CHILLS OR SWEATING *NAUSEA AND VOMITING THAT IS NOT CONTROLLED WITH YOUR NAUSEA MEDICATION *UNUSUAL SHORTNESS OF BREATH *UNUSUAL BRUISING OR BLEEDING *URINARY PROBLEMS (pain or burning when urinating, or frequent urination) *BOWEL PROBLEMS (unusual diarrhea, constipation, pain near the anus) TENDERNESS IN MOUTH AND THROAT WITH OR WITHOUT PRESENCE OF ULCERS (sore throat, sores in mouth, or a toothache) UNUSUAL RASH, SWELLING OR PAIN  UNUSUAL VAGINAL DISCHARGE OR ITCHING   Items with * indicate a potential emergency and should be followed up as soon as possible or go to the Emergency Department if any problems should occur.  Please show the CHEMOTHERAPY ALERT CARD or IMMUNOTHERAPY ALERT CARD at  check-in to the Emergency Department and triage nurse.  Should you have questions after your visit or need to cancel or reschedule your appointment, please contact [facility]  Dept: [phone]  and follow the prompts.  Office hours are 8:00 a.m. to 4:30 p.m. Monday - Friday. Please note that voicemails left after 4:00 p.m. may not be returned until the following business day.  We are closed weekends and major holidays. You have access to a nurse at all times for urgent questions. Please call the main number to the clinic Dept: [phone] and follow the prompts.   For any non-urgent questions, you may also contact your provider using MyChart. We now offer e-Visits for anyone 52 and older to request care online for non-urgent symptoms. For details visit [email or web address].   Also download the MyChart app! Go to the app store, search "MyChart", open the app, select [facility], and log in with your MyChart username and password.  Masks are optional in the cancer centers. If you would like for your care team to wear a mask while they are taking care of you, please let them know. For doctor visits, patients may have with them one support person who is at least 75 years old. At this time, visitors are not allowed in the infusion area.

## 2022-06-27 ENCOUNTER — Other Ambulatory Visit: Payer: Self-pay

## 2022-06-27 MED FILL — Dexamethasone Sodium Phosphate Inj 100 MG/10ML: INTRAMUSCULAR | Qty: 1 | Status: AC

## 2022-06-27 MED FILL — Fosaprepitant Dimeglumine For IV Infusion 150 MG (Base Eq): INTRAVENOUS | Qty: 5 | Status: AC

## 2022-06-27 NOTE — Progress Notes (Signed)
The proposed treatment discussed in conference is for discussion purpose only and is not a binding recommendation.  The patients have not been physically examined, or presented with their treatment options.  Therefore, final treatment plans cannot be decided.  

## 2022-06-28 ENCOUNTER — Other Ambulatory Visit: Payer: Self-pay

## 2022-06-28 ENCOUNTER — Inpatient Hospital Stay: Payer: Medicare HMO

## 2022-06-28 VITALS — BP 135/106 | HR 65 | Temp 98.7°F | Resp 16

## 2022-06-28 DIAGNOSIS — Z5111 Encounter for antineoplastic chemotherapy: Secondary | ICD-10-CM | POA: Diagnosis not present

## 2022-06-28 DIAGNOSIS — C250 Malignant neoplasm of head of pancreas: Secondary | ICD-10-CM

## 2022-06-28 LAB — CMP (CANCER CENTER ONLY)
ALT: 17 U/L (ref 0–44)
AST: 20 U/L (ref 15–41)
Albumin: 3.5 g/dL (ref 3.5–5.0)
Alkaline Phosphatase: 147 U/L — ABNORMAL HIGH (ref 38–126)
Anion gap: 4 — ABNORMAL LOW (ref 5–15)
BUN: 14 mg/dL (ref 8–23)
CO2: 27 mmol/L (ref 22–32)
Calcium: 9.3 mg/dL (ref 8.9–10.3)
Chloride: 108 mmol/L (ref 98–111)
Creatinine: 0.5 mg/dL (ref 0.44–1.00)
GFR, Estimated: >60 mL/min (ref >60)
Glucose, Bld: 151 mg/dL — ABNORMAL HIGH (ref 70–99)
Potassium: 3.8 mmol/L (ref 3.5–5.1)
Sodium: 139 mmol/L (ref 135–145)
Total Bilirubin: 0.3 mg/dL (ref 0.3–1.2)
Total Protein: 6.3 g/dL — ABNORMAL LOW (ref 6.5–8.1)

## 2022-06-28 LAB — CBC WITH DIFFERENTIAL (CANCER CENTER ONLY)
Abs Immature Granulocytes: 0.06 10*3/uL (ref 0.00–0.07)
Basophils Absolute: 0.1 10*3/uL (ref 0.0–0.1)
Basophils Relative: 1 %
Eosinophils Absolute: 0 10*3/uL (ref 0.0–0.5)
Eosinophils Relative: 1 %
HCT: 26.7 % — ABNORMAL LOW (ref 36.0–46.0)
Hemoglobin: 9 g/dL — ABNORMAL LOW (ref 12.0–15.0)
Immature Granulocytes: 1 %
Lymphocytes Relative: 61 %
Lymphs Abs: 2.7 10*3/uL (ref 0.7–4.0)
MCH: 31.5 pg (ref 26.0–34.0)
MCHC: 33.7 g/dL (ref 30.0–36.0)
MCV: 93.4 fL (ref 80.0–100.0)
Monocytes Absolute: 0.2 10*3/uL (ref 0.1–1.0)
Monocytes Relative: 4 %
Neutro Abs: 1.4 10*3/uL — ABNORMAL LOW (ref 1.7–7.7)
Neutrophils Relative %: 32 %
Platelet Count: 199 10*3/uL (ref 150–400)
RBC: 2.86 MIL/uL — ABNORMAL LOW (ref 3.87–5.11)
RDW: 18.6 % — ABNORMAL HIGH (ref 11.5–15.5)
WBC Count: 4.3 10*3/uL (ref 4.0–10.5)
nRBC: 0 % (ref 0.0–0.2)

## 2022-06-28 MED ORDER — SODIUM CHLORIDE 0.9 % IV SOLN: Freq: Once | INTRAVENOUS | Status: AC

## 2022-06-28 MED ORDER — SODIUM CHLORIDE 0.9 % IV SOLN
1000.0000 mg/m2 | Freq: Once | INTRAVENOUS | Status: AC
  Administered 2022-06-28: 1786 mg via INTRAVENOUS
  Filled 2022-06-28: qty 46.97

## 2022-06-28 MED ORDER — SODIUM CHLORIDE 0.9% FLUSH
10.0000 mL | INTRAVENOUS | Status: DC | PRN
  Administered 2022-06-28: 10 mL

## 2022-06-28 MED ORDER — SODIUM CHLORIDE 0.9 % IV SOLN
150.0000 mg | Freq: Once | INTRAVENOUS | Status: AC
  Administered 2022-06-28: 150 mg via INTRAVENOUS
  Filled 2022-06-28: qty 150

## 2022-06-28 MED ORDER — HEPARIN SOD (PORK) LOCK FLUSH 100 UNIT/ML IV SOLN
500.0000 [IU] | Freq: Once | INTRAVENOUS | Status: AC | PRN
  Administered 2022-06-28: 500 [IU]

## 2022-06-28 MED ORDER — PALONOSETRON HCL INJECTION 0.25 MG/5ML
0.2500 mg | Freq: Once | INTRAVENOUS | Status: AC
  Administered 2022-06-28: 0.25 mg via INTRAVENOUS
  Filled 2022-06-28: qty 5

## 2022-06-28 MED ORDER — PACLITAXEL PROTEIN-BOUND CHEMO INJECTION 100 MG
100.0000 mg/m2 | Freq: Once | INTRAVENOUS | Status: AC
  Administered 2022-06-28: 175 mg via INTRAVENOUS
  Filled 2022-06-28: qty 35

## 2022-06-28 MED ORDER — SODIUM CHLORIDE 0.9 % IV SOLN
10.0000 mg | Freq: Once | INTRAVENOUS | Status: AC
  Administered 2022-06-28: 10 mg via INTRAVENOUS
  Filled 2022-06-28: qty 10

## 2022-06-28 NOTE — Patient Instructions (Signed)
[facility]COL[facility]  Discharge Instructions: Thank you for choosing [facility] to provide your oncology and hematology care.   If you have a lab appointment with the [facility], please go directly to the [facility] and check in at the registration area.   Wear comfortable clothing and clothing appropriate for easy access to any Portacath or PICC line.   We strive to give you quality time with your provider. You may need to reschedule your appointment if you arrive late (15 or more minutes).  Arriving late affects you and other patients whose appointments are after yours.  Also, if you miss three or more appointments without notifying the office, you may be dismissed from the clinic at the provider's discretion.      For prescription refill requests, have your pharmacy contact our office and allow 72 hours for refills to be completed.    Today you received the following chemotherapy and/or immunotherapy agents : Abraxane, Gemzar      To help prevent nausea and vomiting after your treatment, we encourage you to take your nausea medication as directed.  BELOW ARE SYMPTOMS THAT SHOULD BE REPORTED IMMEDIATELY: *FEVER GREATER THAN 100.4 F (38 C) OR HIGHER *CHILLS OR SWEATING *NAUSEA AND VOMITING THAT IS NOT CONTROLLED WITH YOUR NAUSEA MEDICATION *UNUSUAL SHORTNESS OF BREATH *UNUSUAL BRUISING OR BLEEDING *URINARY PROBLEMS (pain or burning when urinating, or frequent urination) *BOWEL PROBLEMS (unusual diarrhea, constipation, pain near the anus) TENDERNESS IN MOUTH AND THROAT WITH OR WITHOUT PRESENCE OF ULCERS (sore throat, sores in mouth, or a toothache) UNUSUAL RASH, SWELLING OR PAIN  UNUSUAL VAGINAL DISCHARGE OR ITCHING   Items with * indicate a potential emergency and should be followed up as soon as possible or go to the Emergency Department if any problems should occur.  Please show the CHEMOTHERAPY ALERT CARD or IMMUNOTHERAPY ALERT CARD at  check-in to the Emergency Department and triage nurse.  Should you have questions after your visit or need to cancel or reschedule your appointment, please contact [facility]  Dept: [phone]  and follow the prompts.  Office hours are 8:00 a.m. to 4:30 p.m. Monday - Friday. Please note that voicemails left after 4:00 p.m. may not be returned until the following business day.  We are closed weekends and major holidays. You have access to a nurse at all times for urgent questions. Please call the main number to the clinic Dept: [phone] and follow the prompts.   For any non-urgent questions, you may also contact your provider using MyChart. We now offer e-Visits for anyone 70 and older to request care online for non-urgent symptoms. For details visit [email or web address].   Also download the MyChart app! Go to the app store, search "MyChart", open the app, select [facility] [place], and log in with your MyChart username and password.  Masks are optional in the cancer centers. If you would like for your care team to wear a mask while they are taking care of you, please let them know. For doctor visits, patients may have with them one support person who is at least 75 years old. At this time, visitors are not allowed in the infusion area.

## 2022-06-28 NOTE — Progress Notes (Signed)
Ok to treat today per Dr. Burr [facility] [place] is 1.4.

## 2022-07-11 NOTE — Progress Notes (Unsigned)
[place]   Telephone:(336) [phone] Fax:(336) [phone]   Clinic Follow up Note   Patient Care Team: [name], MD as PCP - General (Internal Medicine) Truitt Merle, MD as Consulting Physician (Oncology) Royston Bake, RN as Oncology Nurse Navigator (Oncology) 07/12/2022  CHIEF COMPLAINT: Follow up pancreas cancer   SUMMARY OF ONCOLOGIC HISTORY: Oncology History Overview Note   Cancer Staging  Pancreatic cancer [facility]) Staging form: Exocrine Pancreas, AJCC 8th Edition - Clinical stage from 01/18/2022: Stage IB (cT2, cN0, cM0) - Signed by Truitt Merle, MD on 01/28/2022    Pancreatic cancer ([place])  01/16/2022 Imaging   CLINICAL DATA:  Jaundice   EXAM: CT ABDOMEN AND PELVIS WITH CONTRAST  IMPRESSION: 1. Marked intra and extrahepatic biliary dilatation with marked dilatation of pancreatic duct and distended gallbladder. There is significant atrophy of the body and tail of pancreas. 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 2. Diverticular disease of the colon without acute wall thickening 3. Mild endometrial thickening up to 9 mm, recommend nonemergent pelvic ultrasound for further evaluation   01/17/2022 Imaging   EXAM: MRI ABDOMEN WITHOUT AND WITH CONTRAST (INCLUDING MRCP)  IMPRESSION: 1. Obstructing mass within the pancreatic head with an intraluminal component demonstrating low level enhancement. 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 2. This mass causes significant extrinsic narrowing at the portal/superior mesenteric vein confluence as well as marked intra and extrahepatic biliary dilatation. 3. No distant metastases identified. 4. Endoscopy for  endoscopic ultrasound, tissue sampling and biliary decompression recommended.   01/18/2022 Cancer Staging   Staging form: Exocrine Pancreas, AJCC 8th Edition - Clinical stage from 01/18/2022: Stage IB (cT2, cN0, cM0) - Signed by Truitt Merle, MD on 01/28/2022 Stage prefix: Initial diagnosis Total positive nodes: 0   01/18/2022 Initial Biopsy   FINAL MICROSCOPIC DIAGNOSIS:  A. PANCREAS, HEAD, FINE NEEDLE ASPIRATION:  - Malignant cells present consistent with adenocarcinoma   01/24/2022 Imaging   EXAM: CT CHEST WITHOUT CONTRAST  IMPRESSION: 1. No evidence of metastatic disease in the chest. 2. Atelectasis or consolidation in the right lower lung. 3. Healing right rib fractures. 4. Mass in the head of the pancreas. See previous report of CT abdomen and pelvis 01/16/2022. Interval placement of the biliary stent.   01/28/2022 Initial Diagnosis   Pancreatic cancer ([place])   02/16/2022 - 06/28/2022 Chemotherapy   Patient is on Treatment Plan : PANCREATIC Abraxane / Gemcitabine D1,8 q21d     02/16/2022 Genetic Testing   Negative genetic testing on the CancerNext-Expanded+RNAinsight panel.  The report date is February 16, 2022.  The CancerNext-Expanded gene panel offered by [facility] and includes sequencing and rearrangement analysis for the following 77 genes: AIP, ALK, APC*, ATM*, AXIN2, BAP1, BARD1, BLM, BMPR1A, BRCA1*, BRCA2*, BRIP1*, CDC73, CDH1*, CDK4, CDKN1B, CDKN2A, CHEK2*, CTNNA1, DICER1, FANCC, FH, FLCN, GALNT12, KIF1B, LZTR1, MAX, MEN1, MET, MLH1*, MSH2*, MSH3, MSH6*, MUTYH*, NBN, NF1*, NF2, NTHL1, PALB2*, PHOX2B, PMS2*, POT1, PRKAR1A, PTCH1, PTEN*, RAD51C*, RAD51D*, RB1, RECQL, RET, SDHA, SDHAF2, SDHB, SDHC, SDHD, SMAD4, SMARCA4, SMARCB1, SMARCE1, STK11, SUFU, TMEM127, TP53*, TSC1, TSC2, VHL and XRCC2 (sequencing and deletion/duplication); EGFR, EGLN1, HOXB13, KIT, MITF, PDGFRA, POLD1, and POLE (sequencing only); EPCAM and GREM1 (deletion/duplication only). DNA and RNA analyses performed for *  genes.    06/14/2022 -  Chemotherapy   Patient is on Treatment Plan : PANCREATIC Abraxane D1,8,15 + Gemcitabine D1,8,15 q28d       CURRENT THERAPY: Neoadjuvant Gemcitabine, days 1 and 8 q21 days; starting 02/16/22. Abraxane added with C2 (03/08/22)  INTERVAL HISTORY: Ms. Przybylski returns for follow up as scheduled. Last seen by Dr. Burr Medico 06/21/22 and completed another cycle of gem/abraxane.  She tolerates treatment well overall.  Energy and appetite remain adequate.  She feels slightly constipated with a small BM every morning but not complete.  Takes 1 stool softener per day.  Denies nausea/vomiting.  Denies abdominal pain.  Toes are numb at times and legs hurt mainly at night, takes 2 gabapentin without major change.  She does cryotherapy with chemo.  She occasionally has pain at the right chest port site and right arm feels warm, no erythema or swelling.  She denies any other specific complaints.  All other systems were reviewed with the patient and are negative.  MEDICAL HISTORY:  Past Medical History:  Diagnosis Date   Blood transfusion    Diabetes mellitus    Family history of breast cancer    Family history of colon cancer    Family history of prostate cancer    Heart murmur    Hypertension     SURGICAL HISTORY: Past Surgical History:  Procedure Laterality Date   BILIARY STENT PLACEMENT  01/18/2022   Procedure: BILIARY STENT PLACEMENT;  Surgeon: Carol Ada, MD;  Location: [place];  Service: Gastroenterology;;   DILATION AND CURETTAGE OF UTERUS     ERCP N/A 01/18/2022   Procedure: ENDOSCOPIC RETROGRADE CHOLANGIOPANCREATOGRAPHY (ERCP);  Surgeon: Carol Ada, MD;  Location: [place];  Service: Gastroenterology;  Laterality: N/A;   ESOPHAGOGASTRODUODENOSCOPY N/A 01/18/2022   Procedure: ESOPHAGOGASTRODUODENOSCOPY (EGD);  Surgeon: Carol Ada, MD;  Location: [place];  Service: Gastroenterology;  Laterality: N/A;   EUS  01/18/2022   Procedure: UPPER ENDOSCOPIC ULTRASOUND  (EUS) LINEAR;  Surgeon: Carol Ada, MD;  Location[facility] END[facility];  Service: Gastroenterology;;   FINE NEEDLE ASPIRATION  01/18/2022   Procedure: FINE NEEDLE ASPIRATION (FNA) LINEAR;  Surgeon: Carol Ada, MD;  Location: [facility] ENDOSCOPY;  Service: Gastroenterology;;   IR IMAGING GUIDED PORT INSERTION  02/15/2022   NO PAST SURGERIES     SPHINCTEROTOMY  01/18/2022   Procedure: Joan Mayans;  Surgeon: Carol Ada, MD;  Location: [place];  Service: Gastroenterology;;    I have reviewed the social history and family history with the patient and they are unchanged from previous note.  ALLERGIES:  has No Known Allergies.  MEDICATIONS:  Current Outpatient Medications  Medication Sig Dispense Refill   lidocaine-prilocaine (EMLA) cream Apply 1 Application topically as needed. 30 g 1   acetaminophen (TYLENOL) 500 MG tablet Take 1,000 mg by mouth every 6 (six) hours as needed for moderate pain or headache.     amLODipine (NORVASC) 5 MG tablet Take 5 mg by mouth daily.     Cholecalciferol (QC VITAMIN D3) 50 MCG (2000 UT) TABS Take 2,000 Units by mouth daily.     gabapentin (NEURONTIN) 100 MG capsule Take 2-3 capsules daily at night 90 capsule 0   KLOR-CON M20 20 MEQ tablet Take 20 mEq by mouth 2 (two) times daily.     NOVOLOG FLEXPEN 100 UNIT/ML FlexPen Inject 15 Units into the skin 2 (two) times daily.     oxyCODONE (OXY IR/ROXICODONE) 5 MG immediate release tablet Take 1 tablet (5 mg total) by mouth every 3 (three) hours as needed for severe pain. [place]  tablet 0   polyethylene glycol (MIRALAX / GLYCOLAX) 17 g packet Take 17 g by mouth daily. 14 each 0   No current facility-administered medications for this visit.   Facility-Administered Medications Ordered in Other Visits  Medication Dose Route Frequency Provider Last Rate Last Admin   dexamethasone (DECADRON) 10 mg in sodium chloride 0.9 % 50 mL IVPB  10 mg Intravenous Once Truitt Merle, MD       fosaprepitant (EMEND) 150 mg in sodium chloride 0.9  % 145 mL IVPB  150 mg Intravenous Once Truitt Merle, MD       gemcitabine (GEMZAR) 1,710 mg in sodium chloride 0.9 % 250 mL chemo infusion  1,000 mg/m2 (Treatment Plan Recorded) Intravenous Once Truitt Merle, MD       PACLitaxel-protein bound (ABRAXANE) chemo infusion 175 mg  100 mg/m2 (Treatment Plan Recorded) Intravenous Once Truitt Merle, MD       palonosetron Leona Carry) injection 0.25 mg  0.25 mg Intravenous Once Truitt Merle, MD       prochlorperazine (COMPAZINE) tablet 10 mg  10 mg Oral Once Truitt Merle, MD        PHYSICAL EXAMINATION: ECOG PERFORMANCE STATUS: 1 - Symptomatic but completely ambulatory  Vitals:   07/12/22 1217  BP: (!) 178/76  Pulse: 78  Resp: 14  Temp: 98.4 F (36.9 C)  SpO2: 97%   Filed Weights   07/12/22 1217  Weight: 145 lb 12.8 oz (66.1 kg)    GENERAL:alert, no distress and comfortable SKIN: No rash EYES: sclera clear LUNGS:  normal breathing effort HEART: no lower extremity edema Musculoskeletal: No right upper extremity swelling NEURO: alert & oriented x 3 with fluent speech, no focal motor deficits PAC without erythema  LABORATORY DATA:  I have reviewed the data as listed    Latest Ref Rng & Units 07/12/2022   12:06 PM 06/28/2022    1:15 PM 06/21/2022    9:50 AM  CBC  WBC 4.0 - 10.5 K/uL 6.3  4.3  5.8   Hemoglobin 12.0 - 15.0 g/dL 9.4  9.0  9.4   Hematocrit 36.0 - 46.0 % 28.4  26.7  28.8   Platelets 150 - 400 K/uL 274  199  249         Latest Ref Rng & Units 07/12/2022   12:06 PM 06/28/2022    1:15 PM 06/21/2022    9:50 AM  CMP  Glucose 70 - 99 mg/dL 186  151  C 247   BUN 8 - 23 mg/dL 12  14  C 11   Creatinine 0.44 - 1.00 mg/dL 0.50  0.50  C 0.55   Sodium 135 - 145 mmol/L 140  139  C 139   Potassium 3.5 - 5.1 mmol/L 3.8  3.8  C 3.7   Chloride 98 - 111 mmol/L 108  108  C 108   CO2 22 - 32 mmol/L 27  27  C 26   Calcium 8.9 - 10.3 mg/dL 9.2  9.3  C 8.9   Total Protein 6.5 - 8.1 g/dL 6.4  6.3  C 6.7   Total Bilirubin 0.3 - 1.2 mg/dL 0.3  0.3  C 0.3    Alkaline Phos 38 - 126 U/L 149  147  C 127   AST 15 - 41 U/L 21  20  C 16   ALT 0 - 44 U/L 17  17  C 12     C Corrected result      RADIOGRAPHIC STUDIES: I have personally  reviewed the radiological images as listed and agreed with the findings in the report. No results found.   ASSESSMENT & PLAN: Miranda Francis is a 76 y.o. female with    1. Pancreatic Cancer, stage IB c(T2, N0, M0), borderline resectable, MMR normal   -presented to PCP with jaundice for one month. Also complained of epigastric pain, poor appetite, weight loss, and light-colored stool. Found to have elevated liver enzymes and referred to ED on 01/16/22. CMP in ED showed tbili at 39.3. CT AP showed: marked biliary dilatation; soft tissue mass at pancreatic head with marked narrowing of SMV; diverticular disease of colon.  No distant metastasis on CT scan. -baseline CA 19-9 on 01/16/22 was elevated at 9,561. -MRCP abdomen MRI on 01/17/22 showed: obstructing mass within pancreatic head with low-level enhancement, causing significant extrinsic narrowing at SMV and dilatation. -ERCP on 01/18/22 by Dr. Benson Norway, FNA of pancreas head mass showed malignant cells consistent with adenocarcinoma. MMR normal. -She was seen by Dr. Michaelle Birks for felt her to be borderline resectable; she agreed with neoadjuvant chemo for 6-8 cycles then reassess -she began neoadjuvant gemcitabine alone on 02/16/22, abraxane was added with C2 (03/08/22).  She has tolerated well except moderate fatigue and nausea.  Aloxi, Decadron, and Emend premeds were added.  -her CA 19-9 initially increased > 15K but is currently trending down on treatment, last 2503 -Restaging CT AP 05/08/2022 showed positive response to treatment to the pancreatic head and neck, persistent extrinsic mass effect, and possible new small right liver lesions which were indeterminate.  Follow-up MRI was done 06/19/2022; liver lesions are indeterminate -She continues gemcitabine and Abraxane on days 1  and 8 q. 21 days, tolerating well with mild constipation and neuropathy.  We reviewed symptom management.  She can increase gabapentin to 300 mg at night. -Side effects are adequately managed with supportive care at home.  She is able to recover and function well.  No clinical evidence of disease progression -Labs reviewed, adequate to proceed with cycle 8-day 1 today as planned, same dose. -She will return next week for cycle 8-day 8.  I am referring her back to discuss surgery with Dr. Zenia Resides. -We briefly discussed if she proceeds with surgery we will see her 2 to 3 weeks after to review surgical path and discuss any adjuvant therapy.  If she does not proceed with surgery we will continue chemo or possibly consolidation radiation -Follow-up pending discussion with Dr. Zenia Resides   2. Symptom Management: decreased appetite with weight loss, abdominal discomfort, neuropathy, constipation -she reports a ~40 lb weight loss over the last 4 months or so. Trying ensure, boost, and glucerna -f/up dietician  -marinol prescribed by PCP was not helpful, mirtazapine works better.  Now off both with improved appetite -She continues Zofran as needed.  Dr. Mickeal Skinner recommended to stop Compazine due to antidopaminergic effects which he felt was contributing to general dyskinesia.  She also is not taking Elavil which she prescribed for sleep and neuro side effects from chemo -Side effects are stable, well managed.  She tolerates treatment well overall -Continue stool softener, take 1-2 daily.  May add MiraLAX if needed -Gabapentin currently 200 mg nightly, can increase to 300 mg   3. Obstructive jaundice -presented with hyperbilirubinemia at 39.3 -s/p stent placement on 01/18/22 -tbili normalized   4. Anemia -hgb dropped to 6.4 by 01/25/22 while in the hospital. She received blood transfusion that day, and hgb recovered to 8.4 the same day. -stable now   5. Genetic  Testing -she reports colon cancer in a brother (age  48) and a sister (age 76). -she proceeded with testing on 02/07/22. Results were negative.    Plan: -Labs reviewed -Proceed with cycle 8-day 1 gemcitabine and Abraxane today as planned -Return next week for cycle 8-day 8 -Symptom management for constipation and neuropathy, gabapentin refilled -Referral back to Dr. Zenia Resides to discuss surgery   All questions were answered. The patient knows to call the clinic with any problems, questions or concerns. No barriers to learning was detected. I spent 20 minutes counseling the patient face to face. The total time spent in the appointment was 30 minutes and more than 50% was on counseling and review of test results.     Alla Feeling, NP 07/12/22

## 2022-07-12 ENCOUNTER — Inpatient Hospital Stay: Payer: Medicare HMO

## 2022-07-12 ENCOUNTER — Inpatient Hospital Stay (HOSPITAL_BASED_OUTPATIENT_CLINIC_OR_DEPARTMENT_OTHER): Payer: Medicare HMO | Admitting: Nurse Practitioner

## 2022-07-12 ENCOUNTER — Encounter: Payer: Self-pay | Admitting: Nurse Practitioner

## 2022-07-12 ENCOUNTER — Other Ambulatory Visit: Payer: Self-pay | Admitting: Hematology

## 2022-07-12 ENCOUNTER — Other Ambulatory Visit: Payer: Self-pay

## 2022-07-12 ENCOUNTER — Other Ambulatory Visit (HOSPITAL_BASED_OUTPATIENT_CLINIC_OR_DEPARTMENT_OTHER): Payer: Self-pay

## 2022-07-12 VITALS — BP 144/82 | HR 60 | Resp 16

## 2022-07-12 DIAGNOSIS — Z5111 Encounter for antineoplastic chemotherapy: Secondary | ICD-10-CM | POA: Diagnosis not present

## 2022-07-12 DIAGNOSIS — C250 Malignant neoplasm of head of pancreas: Secondary | ICD-10-CM

## 2022-07-12 LAB — CBC WITH DIFFERENTIAL/PLATELET
Abs Immature Granulocytes: 0.01 10*3/uL (ref 0.00–0.07)
Basophils Absolute: 0 10*3/uL (ref 0.0–0.1)
Basophils Relative: 1 %
Eosinophils Absolute: 0.1 10*3/uL (ref 0.0–0.5)
Eosinophils Relative: 2 %
HCT: 28.4 % — ABNORMAL LOW (ref 36.0–46.0)
Hemoglobin: 9.4 g/dL — ABNORMAL LOW (ref 12.0–15.0)
Immature Granulocytes: 0 %
Lymphocytes Relative: 39 %
Lymphs Abs: 2.5 10*3/uL (ref 0.7–4.0)
MCH: 31 pg (ref 26.0–34.0)
MCHC: 33.1 g/dL (ref 30.0–36.0)
MCV: 93.7 fL (ref 80.0–100.0)
Monocytes Absolute: 0.4 10*3/uL (ref 0.1–1.0)
Monocytes Relative: 7 %
Neutro Abs: 3.3 10*3/uL (ref 1.7–7.7)
Neutrophils Relative %: 51 %
Platelets: 274 10*3/uL (ref 150–400)
RBC: 3.03 MIL/uL — ABNORMAL LOW (ref 3.87–5.11)
RDW: 18.8 % — ABNORMAL HIGH (ref 11.5–15.5)
WBC: 6.3 10*3/uL (ref 4.0–10.5)
nRBC: 0 % (ref 0.0–0.2)

## 2022-07-12 LAB — COMPREHENSIVE METABOLIC PANEL
ALT: 17 U/L (ref 0–44)
AST: 21 U/L (ref 15–41)
Albumin: 3.6 g/dL (ref 3.5–5.0)
Alkaline Phosphatase: 149 U/L — ABNORMAL HIGH (ref 38–126)
Anion gap: 5 (ref 5–15)
BUN: 12 mg/dL (ref 8–23)
CO2: 27 mmol/L (ref 22–32)
Calcium: 9.2 mg/dL (ref 8.9–10.3)
Chloride: 108 mmol/L (ref 98–111)
Creatinine, Ser: 0.5 mg/dL (ref 0.44–1.00)
GFR, Estimated: >60 mL/min (ref >60)
Glucose, Bld: 186 mg/dL — ABNORMAL HIGH (ref 70–99)
Potassium: 3.8 mmol/L (ref 3.5–5.1)
Sodium: 140 mmol/L (ref 135–145)
Total Bilirubin: 0.3 mg/dL (ref 0.3–1.2)
Total Protein: 6.4 g/dL — ABNORMAL LOW (ref 6.5–8.1)

## 2022-07-12 MED ORDER — GABAPENTIN 100 MG PO CAPS
200.0000 mg | ORAL_CAPSULE | Freq: Every day | ORAL | 0 refills | Status: DC
  Filled 2022-07-12: qty 90, 30d supply, fill #0

## 2022-07-12 MED ORDER — SODIUM CHLORIDE 0.9 % IV SOLN
150.0000 mg | Freq: Once | INTRAVENOUS | Status: AC
  Administered 2022-07-12: 150 mg via INTRAVENOUS
  Filled 2022-07-12: qty 150

## 2022-07-12 MED ORDER — SODIUM CHLORIDE 0.9 % IV SOLN
10.0000 mg | Freq: Once | INTRAVENOUS | Status: AC
  Administered 2022-07-12: 10 mg via INTRAVENOUS
  Filled 2022-07-12: qty 10

## 2022-07-12 MED ORDER — SODIUM CHLORIDE 0.9 % IV SOLN
1000.0000 mg/m2 | Freq: Once | INTRAVENOUS | Status: AC
  Administered 2022-07-12: 1710 mg via INTRAVENOUS
  Filled 2022-07-12: qty 44.97

## 2022-07-12 MED ORDER — HEPARIN SOD (PORK) LOCK FLUSH 100 UNIT/ML IV SOLN
500.0000 [IU] | Freq: Once | INTRAVENOUS | Status: AC | PRN
  Administered 2022-07-12: 500 [IU]

## 2022-07-12 MED ORDER — PACLITAXEL PROTEIN-BOUND CHEMO INJECTION 100 MG
100.0000 mg/m2 | Freq: Once | INTRAVENOUS | Status: AC
  Administered 2022-07-12: 175 mg via INTRAVENOUS
  Filled 2022-07-12: qty 35

## 2022-07-12 MED ORDER — SODIUM CHLORIDE 0.9 % IV SOLN: Freq: Once | INTRAVENOUS | Status: AC

## 2022-07-12 MED ORDER — PALONOSETRON HCL INJECTION 0.25 MG/5ML
0.2500 mg | Freq: Once | INTRAVENOUS | Status: AC
  Administered 2022-07-12: 0.25 mg via INTRAVENOUS
  Filled 2022-07-12: qty 5

## 2022-07-12 MED ORDER — PROCHLORPERAZINE MALEATE 10 MG PO TABS
10.0000 mg | ORAL_TABLET | Freq: Once | ORAL | Status: AC
  Administered 2022-07-12: 10 mg via ORAL
  Filled 2022-07-12: qty 1

## 2022-07-12 MED ORDER — SODIUM CHLORIDE 0.9% FLUSH
10.0000 mL | INTRAVENOUS | Status: DC | PRN
  Administered 2022-07-12: 10 mL

## 2022-07-12 MED ORDER — LIDOCAINE-PRILOCAINE 2.5-2.5 % EX CREA
1.0000 | TOPICAL_CREAM | CUTANEOUS | 1 refills | Status: DC | PRN
  Filled 2022-07-12: qty 30, 15d supply, fill #0

## 2022-07-12 NOTE — Patient Instructions (Signed)
[facility]  Discharge Instructions: Thank you for choosing [facility] to provide your oncology and hematology care.   If you have a lab appointment with the [facility], please go directly to the [facility] and check in at the registration area.   Wear comfortable clothing and clothing appropriate for easy access to any Portacath or PICC line.   We strive to give you quality time with your provider. You may need to reschedule your appointment if you arrive late (15 or more minutes).  Arriving late affects you and other patients whose appointments are after yours.  Also, if you miss three or more appointments without notifying the office, you may be dismissed from the clinic at the provider's discretion.      For prescription refill requests, have your pharmacy contact our office and allow 72 hours for refills to be completed.    Today you received the following chemotherapy and/or immunotherapy agents : Abraxane, Gemzar      To help prevent nausea and vomiting after your treatment, we encourage you to take your nausea medication as directed.  BELOW ARE SYMPTOMS THAT SHOULD BE REPORTED IMMEDIATELY: *FEVER GREATER THAN 100.4 F (38 C) OR HIGHER *CHILLS OR SWEATING *NAUSEA AND VOMITING THAT IS NOT CONTROLLED WITH YOUR NAUSEA MEDICATION *UNUSUAL SHORTNESS OF BREATH *UNUSUAL BRUISING OR BLEEDING *URINARY PROBLEMS (pain or burning when urinating, or frequent urination) *BOWEL PROBLEMS (unusual diarrhea, constipation, pain near the anus) TENDERNESS IN MOUTH AND THROAT WITH OR WITHOUT PRESENCE OF ULCERS (sore throat, sores in mouth, or a toothache) UNUSUAL RASH, SWELLING OR PAIN  UNUSUAL VAGINAL DISCHARGE OR ITCHING   Items with * indicate a potential emergency and should be followed up as soon as possible or go to the Emergency Department if any problems should occur.  Please show the CHEMOTHERAPY ALERT CARD or IMMUNOTHERAPY ALERT CARD at  check-in to the Emergency Department and triage nurse.  Should you have questions after your visit or need to cancel or reschedule your appointment, please contact [facility]  Dept: [phone]  and follow the prompts.  Office hours are 8:00 a.m. to 4:30 p.m. Monday - Friday. Please note that voicemails left after 4:00 p.m. may not be returned until the following business day.  We are closed weekends and major holidays. You have access to a nurse at all times for urgent questions. Please call the main number to the clinic Dept: [phone] and follow the prompts.   For any non-urgent questions, you may also contact your provider using MyChart. We now offer e-Visits for anyone 58 and older to request care online for non-urgent symptoms. For details visit [email or web address].   Also download the MyChart app! Go to the app store, search "MyChart", open the app, select [facility], and log in with your MyChart username and password.  Masks are optional in the cancer centers. If you would like for your care team to wear a mask while they are taking care of you, please let them know. For doctor visits, patients may have with them one support person who is at least 75 years old. At this time, visitors are not allowed in the infusion area.

## 2022-07-13 LAB — CANCER ANTIGEN 19-9: CA 19-9: 2584 U/mL — ABNORMAL HIGH (ref 0–35)

## 2022-07-18 ENCOUNTER — Other Ambulatory Visit: Payer: Self-pay | Admitting: Hematology

## 2022-07-18 DIAGNOSIS — C250 Malignant neoplasm of head of pancreas: Secondary | ICD-10-CM

## 2022-07-18 MED FILL — Fosaprepitant Dimeglumine For IV Infusion 150 MG (Base Eq): INTRAVENOUS | Qty: 5 | Status: AC

## 2022-07-18 MED FILL — Dexamethasone Sodium Phosphate Inj 100 MG/10ML: INTRAMUSCULAR | Qty: 1 | Status: AC

## 2022-07-19 ENCOUNTER — Inpatient Hospital Stay: Payer: Medicare HMO | Attending: Physician Assistant

## 2022-07-19 ENCOUNTER — Inpatient Hospital Stay: Payer: Medicare HMO

## 2022-07-19 VITALS — BP 140/78 | HR 63 | Temp 98.6°F | Resp 16 | Ht 63.0 in | Wt 146.4 lb

## 2022-07-19 DIAGNOSIS — T451X5A Adverse effect of antineoplastic and immunosuppressive drugs, initial encounter: Secondary | ICD-10-CM | POA: Diagnosis not present

## 2022-07-19 DIAGNOSIS — D649 Anemia, unspecified: Secondary | ICD-10-CM | POA: Diagnosis not present

## 2022-07-19 DIAGNOSIS — C250 Malignant neoplasm of head of pancreas: Secondary | ICD-10-CM | POA: Insufficient documentation

## 2022-07-19 DIAGNOSIS — Z79899 Other long term (current) drug therapy: Secondary | ICD-10-CM | POA: Insufficient documentation

## 2022-07-19 DIAGNOSIS — Z5111 Encounter for antineoplastic chemotherapy: Secondary | ICD-10-CM | POA: Diagnosis present

## 2022-07-19 DIAGNOSIS — G620 Drug-induced polyneuropathy: Secondary | ICD-10-CM | POA: Diagnosis not present

## 2022-07-19 DIAGNOSIS — Z95828 Presence of other vascular implants and grafts: Secondary | ICD-10-CM

## 2022-07-19 LAB — CMP (CANCER CENTER ONLY)
ALT: 15 U/L (ref 0–44)
AST: 17 U/L (ref 15–41)
Albumin: 3.5 g/dL (ref 3.5–5.0)
Alkaline Phosphatase: 144 U/L — ABNORMAL HIGH (ref 38–126)
Anion gap: 3 — ABNORMAL LOW (ref 5–15)
BUN: 13 mg/dL (ref 8–23)
CO2: 28 mmol/L (ref 22–32)
Calcium: 9 mg/dL (ref 8.9–10.3)
Chloride: 108 mmol/L (ref 98–111)
Creatinine: 0.53 mg/dL (ref 0.44–1.00)
GFR, Estimated: >60 mL/min (ref >60)
Glucose, Bld: 162 mg/dL — ABNORMAL HIGH (ref 70–99)
Potassium: 3.7 mmol/L (ref 3.5–5.1)
Sodium: 139 mmol/L (ref 135–145)
Total Bilirubin: 0.3 mg/dL (ref 0.3–1.2)
Total Protein: 6.4 g/dL — ABNORMAL LOW (ref 6.5–8.1)

## 2022-07-19 LAB — CBC WITH DIFFERENTIAL (CANCER CENTER ONLY)
Abs Immature Granulocytes: 0.07 10*3/uL (ref 0.00–0.07)
Basophils Absolute: 0 10*3/uL (ref 0.0–0.1)
Basophils Relative: 1 %
Eosinophils Absolute: 0 10*3/uL (ref 0.0–0.5)
Eosinophils Relative: 1 %
HCT: 26.6 % — ABNORMAL LOW (ref 36.0–46.0)
Hemoglobin: 9 g/dL — ABNORMAL LOW (ref 12.0–15.0)
Immature Granulocytes: 2 %
Lymphocytes Relative: 62 %
Lymphs Abs: 2.7 10*3/uL (ref 0.7–4.0)
MCH: 31.5 pg (ref 26.0–34.0)
MCHC: 33.8 g/dL (ref 30.0–36.0)
MCV: 93 fL (ref 80.0–100.0)
Monocytes Absolute: 0.2 10*3/uL (ref 0.1–1.0)
Monocytes Relative: 5 %
Neutro Abs: 1.3 10*3/uL — ABNORMAL LOW (ref 1.7–7.7)
Neutrophils Relative %: 29 %
Platelet Count: 237 10*3/uL (ref 150–400)
RBC: 2.86 MIL/uL — ABNORMAL LOW (ref 3.87–5.11)
RDW: 18.3 % — ABNORMAL HIGH (ref 11.5–15.5)
WBC Count: 4.4 10*3/uL (ref 4.0–10.5)
nRBC: 0 % (ref 0.0–0.2)

## 2022-07-19 MED ORDER — PALONOSETRON HCL INJECTION 0.25 MG/5ML
0.2500 mg | Freq: Once | INTRAVENOUS | Status: AC
  Administered 2022-07-19: 0.25 mg via INTRAVENOUS
  Filled 2022-07-19: qty 5

## 2022-07-19 MED ORDER — PACLITAXEL PROTEIN-BOUND CHEMO INJECTION 100 MG
100.0000 mg/m2 | Freq: Once | INTRAVENOUS | Status: AC
  Administered 2022-07-19: 175 mg via INTRAVENOUS
  Filled 2022-07-19: qty 35

## 2022-07-19 MED ORDER — SODIUM CHLORIDE 0.9 % IV SOLN
10.0000 mg | Freq: Once | INTRAVENOUS | Status: AC
  Administered 2022-07-19: 10 mg via INTRAVENOUS
  Filled 2022-07-19: qty 10

## 2022-07-19 MED ORDER — SODIUM CHLORIDE 0.9 % IV SOLN
1000.0000 mg/m2 | Freq: Once | INTRAVENOUS | Status: AC
  Administered 2022-07-19: 1710 mg via INTRAVENOUS
  Filled 2022-07-19: qty 44.97

## 2022-07-19 MED ORDER — PROCHLORPERAZINE MALEATE 10 MG PO TABS
10.0000 mg | ORAL_TABLET | Freq: Once | ORAL | Status: AC
  Administered 2022-07-19: 10 mg via ORAL
  Filled 2022-07-19: qty 1

## 2022-07-19 MED ORDER — SODIUM CHLORIDE 0.9 % IV SOLN
150.0000 mg | Freq: Once | INTRAVENOUS | Status: AC
  Administered 2022-07-19: 150 mg via INTRAVENOUS
  Filled 2022-07-19: qty 150

## 2022-07-19 MED ORDER — SODIUM CHLORIDE 0.9% FLUSH
10.0000 mL | INTRAVENOUS | Status: DC | PRN
  Administered 2022-07-19: 10 mL

## 2022-07-19 MED ORDER — SODIUM CHLORIDE 0.9% FLUSH
10.0000 mL | Freq: Once | INTRAVENOUS | Status: AC
  Administered 2022-07-19: 10 mL

## 2022-07-19 MED ORDER — HEPARIN SOD (PORK) LOCK FLUSH 100 UNIT/ML IV SOLN
500.0000 [IU] | Freq: Once | INTRAVENOUS | Status: AC | PRN
  Administered 2022-07-19: 500 [IU]

## 2022-07-19 MED ORDER — SODIUM CHLORIDE 0.9 % IV SOLN: Freq: Once | INTRAVENOUS | Status: AC

## 2022-07-19 NOTE — Progress Notes (Signed)
Ok to treat with ANC of 1.3 today- per Dr. Burr Medico.

## 2022-07-19 NOTE — Patient Instructions (Signed)
[facility]COL[facility]  Discharge Instructions: Thank you for choosing [facility] to provide your oncology and hematology care.   If you have a lab appointment with the [facility], please go directly to the [facility] and check in at the registration area.   Wear comfortable clothing and clothing appropriate for easy access to any Portacath or PICC line.   We strive to give you quality time with your provider. You may need to reschedule your appointment if you arrive late (15 or more minutes).  Arriving late affects you and other patients whose appointments are after yours.  Also, if you miss three or more appointments without notifying the office, you may be dismissed from the clinic at the provider's discretion.      For prescription refill requests, have your pharmacy contact our office and allow 72 hours for refills to be completed.    Today you received the following chemotherapy and/or immunotherapy agents : Abraxane, Gemzar      To help prevent nausea and vomiting after your treatment, we encourage you to take your nausea medication as directed.  BELOW ARE SYMPTOMS THAT SHOULD BE REPORTED IMMEDIATELY: *FEVER GREATER THAN 100.4 F (38 C) OR HIGHER *CHILLS OR SWEATING *NAUSEA AND VOMITING THAT IS NOT CONTROLLED WITH YOUR NAUSEA MEDICATION *UNUSUAL SHORTNESS OF BREATH *UNUSUAL BRUISING OR BLEEDING *URINARY PROBLEMS (pain or burning when urinating, or frequent urination) *BOWEL PROBLEMS (unusual diarrhea, constipation, pain near the anus) TENDERNESS IN MOUTH AND THROAT WITH OR WITHOUT PRESENCE OF ULCERS (sore throat, sores in mouth, or a toothache) UNUSUAL RASH, SWELLING OR PAIN  UNUSUAL VAGINAL DISCHARGE OR ITCHING   Items with * indicate a potential emergency and should be followed up as soon as possible or go to the Emergency Department if any problems should occur.  Please show the CHEMOTHERAPY ALERT CARD or IMMUNOTHERAPY ALERT CARD at  check-in to the Emergency Department and triage nurse.  Should you have questions after your visit or need to cancel or reschedule your appointment, please contact [facility]  Dept: [phone]  and follow the prompts.  Office hours are 8:00 a.m. to 4:30 p.m. Monday - Friday. Please note that voicemails left after 4:00 p.m. may not be returned until the following business day.  We are closed weekends and major holidays. You have access to a nurse at all times for urgent questions. Please call the main number to the clinic Dept: [phone] and follow the prompts.   For any non-urgent questions, you may also contact your provider using MyChart. We now offer e-Visits for anyone 53 and older to request care online for non-urgent symptoms. For details visit [email or web address].   Also download the MyChart app! Go to the app store, search "MyChart", open the app, select [facility], and log in with your MyChart username and password.  Masks are optional in the cancer centers. If you would like for your care team to wear a mask while they are taking care of you, please let them know. For doctor visits, patients may have with them one support person who is at least 75 years old. At this time, visitors are not allowed in the infusion area.

## 2022-07-27 ENCOUNTER — Telehealth: Payer: Self-pay | Admitting: Hematology

## 2022-07-27 NOTE — Telephone Encounter (Signed)
Scheduled follow-up appointments per appointment request workqueue. Patient is aware. 

## 2022-07-28 ENCOUNTER — Other Ambulatory Visit: Payer: Self-pay

## 2022-07-29 ENCOUNTER — Other Ambulatory Visit: Payer: Self-pay

## 2022-08-01 MED FILL — Fosaprepitant Dimeglumine For IV Infusion 150 MG (Base Eq): INTRAVENOUS | Qty: 5 | Status: AC

## 2022-08-01 MED FILL — Dexamethasone Sodium Phosphate Inj 100 MG/10ML: INTRAMUSCULAR | Qty: 1 | Status: AC

## 2022-08-02 ENCOUNTER — Encounter: Payer: Self-pay | Admitting: Hematology

## 2022-08-02 ENCOUNTER — Inpatient Hospital Stay: Payer: Medicare HMO

## 2022-08-02 ENCOUNTER — Inpatient Hospital Stay (HOSPITAL_BASED_OUTPATIENT_CLINIC_OR_DEPARTMENT_OTHER): Payer: Medicare HMO | Admitting: Hematology

## 2022-08-02 ENCOUNTER — Other Ambulatory Visit: Payer: Self-pay

## 2022-08-02 VITALS — BP 119/74 | HR 68 | Temp 98.7°F | Resp 17 | Wt 148.5 lb

## 2022-08-02 DIAGNOSIS — C250 Malignant neoplasm of head of pancreas: Secondary | ICD-10-CM

## 2022-08-02 DIAGNOSIS — Z5111 Encounter for antineoplastic chemotherapy: Secondary | ICD-10-CM | POA: Diagnosis not present

## 2022-08-02 DIAGNOSIS — Z95828 Presence of other vascular implants and grafts: Secondary | ICD-10-CM

## 2022-08-02 LAB — CBC WITH DIFFERENTIAL/PLATELET
Abs Immature Granulocytes: 0.02 10*3/uL (ref 0.00–0.07)
Basophils Absolute: 0 10*3/uL (ref 0.0–0.1)
Basophils Relative: 0 %
Eosinophils Absolute: 0.1 10*3/uL (ref 0.0–0.5)
Eosinophils Relative: 1 %
HCT: 28.2 % — ABNORMAL LOW (ref 36.0–46.0)
Hemoglobin: 9.2 g/dL — ABNORMAL LOW (ref 12.0–15.0)
Immature Granulocytes: 0 %
Lymphocytes Relative: 29 %
Lymphs Abs: 2 10*3/uL (ref 0.7–4.0)
MCH: 30.9 pg (ref 26.0–34.0)
MCHC: 32.6 g/dL (ref 30.0–36.0)
MCV: 94.6 fL (ref 80.0–100.0)
Monocytes Absolute: 0.5 10*3/uL (ref 0.1–1.0)
Monocytes Relative: 7 %
Neutro Abs: 4.3 10*3/uL (ref 1.7–7.7)
Neutrophils Relative %: 63 %
Platelets: 233 10*3/uL (ref 150–400)
RBC: 2.98 MIL/uL — ABNORMAL LOW (ref 3.87–5.11)
RDW: 18.6 % — ABNORMAL HIGH (ref 11.5–15.5)
WBC: 6.9 10*3/uL (ref 4.0–10.5)
nRBC: 0 % (ref 0.0–0.2)

## 2022-08-02 LAB — COMPREHENSIVE METABOLIC PANEL
ALT: 14 U/L (ref 0–44)
AST: 16 U/L (ref 15–41)
Albumin: 3.4 g/dL — ABNORMAL LOW (ref 3.5–5.0)
Alkaline Phosphatase: 161 U/L — ABNORMAL HIGH (ref 38–126)
Anion gap: 5 (ref 5–15)
BUN: 13 mg/dL (ref 8–23)
CO2: 26 mmol/L (ref 22–32)
Calcium: 8.7 mg/dL — ABNORMAL LOW (ref 8.9–10.3)
Chloride: 108 mmol/L (ref 98–111)
Creatinine, Ser: 0.6 mg/dL (ref 0.44–1.00)
GFR, Estimated: >60 mL/min (ref >60)
Glucose, Bld: 263 mg/dL — ABNORMAL HIGH (ref 70–99)
Potassium: 3.9 mmol/L (ref 3.5–5.1)
Sodium: 139 mmol/L (ref 135–145)
Total Bilirubin: 0.3 mg/dL (ref 0.3–1.2)
Total Protein: 6.1 g/dL — ABNORMAL LOW (ref 6.5–8.1)

## 2022-08-02 MED ORDER — SODIUM CHLORIDE 0.9% FLUSH
10.0000 mL | Freq: Once | INTRAVENOUS | Status: AC
  Administered 2022-08-02: 10 mL

## 2022-08-02 MED ORDER — SODIUM CHLORIDE 0.9 % IV SOLN: Freq: Once | INTRAVENOUS | Status: AC

## 2022-08-02 MED ORDER — SODIUM CHLORIDE 0.9 % IV SOLN
10.0000 mg | Freq: Once | INTRAVENOUS | Status: AC
  Administered 2022-08-02: 10 mg via INTRAVENOUS
  Filled 2022-08-02: qty 10

## 2022-08-02 MED ORDER — SODIUM CHLORIDE 0.9 % IV SOLN
150.0000 mg | Freq: Once | INTRAVENOUS | Status: AC
  Administered 2022-08-02: 150 mg via INTRAVENOUS
  Filled 2022-08-02: qty 150

## 2022-08-02 MED ORDER — PALONOSETRON HCL INJECTION 0.25 MG/5ML
0.2500 mg | Freq: Once | INTRAVENOUS | Status: AC
  Administered 2022-08-02: 0.25 mg via INTRAVENOUS
  Filled 2022-08-02: qty 5

## 2022-08-02 MED ORDER — HEPARIN SOD (PORK) LOCK FLUSH 100 UNIT/ML IV SOLN
500.0000 [IU] | Freq: Once | INTRAVENOUS | Status: AC | PRN
  Administered 2022-08-02: 500 [IU]

## 2022-08-02 MED ORDER — PROCHLORPERAZINE MALEATE 10 MG PO TABS
10.0000 mg | ORAL_TABLET | Freq: Once | ORAL | Status: AC
  Administered 2022-08-02: 10 mg via ORAL
  Filled 2022-08-02: qty 1

## 2022-08-02 MED ORDER — SODIUM CHLORIDE 0.9 % IV SOLN
1000.0000 mg/m2 | Freq: Once | INTRAVENOUS | Status: AC
  Administered 2022-08-02: 1710 mg via INTRAVENOUS
  Filled 2022-08-02: qty 44.97

## 2022-08-02 MED ORDER — SODIUM CHLORIDE 0.9% FLUSH
10.0000 mL | INTRAVENOUS | Status: DC | PRN
  Administered 2022-08-02: 10 mL

## 2022-08-02 MED ORDER — PACLITAXEL PROTEIN-BOUND CHEMO INJECTION 100 MG
80.0000 mg/m2 | Freq: Once | INTRAVENOUS | Status: AC
  Administered 2022-08-02: 125 mg via INTRAVENOUS
  Filled 2022-08-02: qty 25

## 2022-08-02 NOTE — Progress Notes (Signed)
[place]   [phone] Fax:([phone]) [phone]   Clinic Follow up Note   Patient Care Team: [name], MD as PCP - General (Internal Medicine) Truitt Merle, MD as Consulting Physician (Oncology) Royston Bake, RN as Oncology Nurse Navigator (Oncology)  Date of Service:  08/02/2022  CHIEF COMPLAINT: f/u of pancreatic cancer  CURRENT THERAPY:  Neoadjuvant Gemcitabine, days 1 and 8 q21d, starting 02/16/22 -Abraxane added with C2 (03/08/22)  ASSESSMENT & PLAN:  Miranda Francis is a 76 y.o. female with   1. Pancreatic Cancer, stage IB c(T2, N0, Mx) with indeterminate liver lesions, borderline resectable, MMR normal   -presented to PCP with jaundice for one month. Also complained of epigastric pain, poor appetite, weight loss, and light-colored stool. Found to have elevated liver enzymes and referred to ED on 01/16/22. CMP in ED showed tbili at 39.3. CT AP showed: marked biliary dilatation; soft tissue mass at pancreatic head with marked narrowing of SMV; diverticular disease of colon.  No distant metastasis on CT scan. -baseline CA 19-9 on 01/16/22 was elevated at 9,561. -MRCP abdomen MRI on 01/17/22 showed: obstructing mass within pancreatic head with low-level enhancement, causing significant extrinsic narrowing at SMV and dilatation. -ERCP on 01/18/22 by Dr. Benson Norway, FNA of pancreas head mass showed malignant cells consistent with adenocarcinoma. MMR normal. -she began neoadjuvant gemcitabine alone on 02/16/22, abraxane was added with C2 (03/08/22).   -abdomen MRI on 06/19/22 showed: subtle liver lesions are stable from MRI in 01/2022 (not reported then); no change in pancreatic mass. Her CA 19-9 dropped significantly since she started chemo treatment but has stabilized lately. For now, we will continue her current treatment. -Dr. Zenia Resides feels she is not a good surgical candidate. -she is tolerating treatment well overall. Labs reviewed, overall stable. Adequate to continue  treatment.   2. Symptom Management: decreased appetite with weight loss, numbness -weight is stable lately, she is followed by dietician -she developed numbness in her toes. She is on gabapentin with little relief. I will reduce her dose today.   3. Anemia -hgb dropped to 6.4 by 01/25/22 while in the hospital. She received blood transfusion that day, and hgb recovered to 8.4 the same day. -hgb stable at 9.2 today (08/02/22)   4.  Neuropathy secondary to chemo, grade 1 -She has developed numbness on her toes, no impact on her function, no neuropathy in her hands. -We will reduce her Abraxane dose, and monitor closely.   PLAN: -proceed with C9 gem/Abraxane today and in 1 week, with Abraxane dose reduced to 80 mg/m due to neuropathy -lab, flush, and gem/abraxane in 3 and 4 weeks             -she prefers Thursday late morning or later -f/u in 3 weeks   No problem-specific Assessment & Plan notes found for this encounter.   SUMMARY OF ONCOLOGIC HISTORY: Oncology History Overview Note   Cancer Staging  Pancreatic cancer [facility]) Staging form: Exocrine Pancreas, AJCC 8th Edition - Clinical stage from 01/18/2022: Stage IB (cT2, cN0, cM0) - Signed by Truitt Merle, MD on 01/28/2022    Pancreatic cancer ([place])  01/16/2022 Imaging   CLINICAL DATA:  Jaundice   EXAM: CT ABDOMEN AND PELVIS WITH CONTRAST  IMPRESSION: 1. Marked intra and extrahepatic biliary dilatation with marked dilatation of pancreatic duct and distended gallbladder. There is significant atrophy of the body and tail of pancreas. Ill-defined soft tissue mass at the pancreatic head neck region with marked narrowing of the SMV by  ill-defined mass, constellation of findings concerning for neoplasm/pancreas carcinoma. 2. Diverticular disease of the colon without acute wall thickening 3. Mild endometrial thickening up to 9 mm, recommend nonemergent pelvic ultrasound for further evaluation   01/17/2022 Imaging   EXAM: MRI ABDOMEN  WITHOUT AND WITH CONTRAST (INCLUDING MRCP)  IMPRESSION: 1. Obstructing mass within the pancreatic head with an intraluminal component demonstrating low level enhancement. 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 2. This mass causes significant extrinsic narrowing at the portal/superior mesenteric vein confluence as well as marked intra and extrahepatic biliary dilatation. 3. No distant metastases identified. 4. Endoscopy for endoscopic ultrasound, tissue sampling and biliary decompression recommended.   01/18/2022 Cancer Staging   Staging form: Exocrine Pancreas, AJCC 8th Edition - Clinical stage from 01/18/2022: Stage IB (cT2, cN0, cM0) - Signed by Malachy Mood, MD on 01/28/2022 Stage prefix: Initial diagnosis Total positive nodes: 0   01/18/2022 Initial Biopsy   FINAL MICROSCOPIC DIAGNOSIS:  A. PANCREAS, HEAD, FINE NEEDLE ASPIRATION:  - Malignant cells present consistent with adenocarcinoma   01/24/2022 Imaging   EXAM: CT CHEST WITHOUT CONTRAST  IMPRESSION: 1. No evidence of metastatic disease in the chest. 2. Atelectasis or consolidation in the right lower lung. 3. Healing right rib fractures. 4. Mass in the head of the pancreas. See previous report of CT abdomen and pelvis 01/16/2022. Interval placement of the biliary stent.   01/28/2022 Initial Diagnosis   Pancreatic cancer (HCC)   02/16/2022 - 06/28/2022 Chemotherapy   Patient is on Treatment Plan : PANCREATIC Abraxane / Gemcitabine D1,8 q21d     02/16/2022 Genetic Testing   Negative genetic testing on the CancerNext-Expanded+RNAinsight panel.  The report date is February 16, 2022.  The CancerNext-Expanded gene panel offered by [facility] and includes sequencing and rearrangement analysis for the following 77 genes: AIP, ALK, APC*, ATM*, AXIN2, BAP1, BARD1, BLM, BMPR1A,  BRCA1*, BRCA2*, BRIP1*, CDC73, CDH1*, CDK4, CDKN1B, CDKN2A, CHEK2*, CTNNA1, DICER1, FANCC, FH, FLCN, GALNT12, KIF1B, LZTR1, MAX, MEN1, MET, MLH1*, MSH2*, MSH3, MSH6*, MUTYH*, NBN, NF1*, NF2, NTHL1, PALB2*, PHOX2B, PMS2*, POT1, PRKAR1A, PTCH1, PTEN*, RAD51C*, RAD51D*, RB1, RECQL, RET, SDHA, SDHAF2, SDHB, SDHC, SDHD, SMAD4, SMARCA4, SMARCB1, SMARCE1, STK11, SUFU, TMEM127, TP53*, TSC1, TSC2, VHL and XRCC2 (sequencing and deletion/duplication); EGFR, EGLN1, HOXB13, KIT, MITF, PDGFRA, POLD1, and POLE (sequencing only); EPCAM and GREM1 (deletion/duplication only). DNA and RNA analyses performed for * genes.    06/14/2022 -  Chemotherapy   Patient is on Treatment Plan : PANCREATIC Abraxane D1,8,15 + Gemcitabine D1,8,15 q28d        INTERVAL HISTORY:  Miranda Francis is here for a follow up of pancreatic cancer. She was last seen by NP Lacie on 07/12/22. She was seen in the infusion area. She reports continued numbness, more bothersome at night.   All other systems were reviewed with the patient and are negative.  MEDICAL HISTORY:  Past Medical History:  Diagnosis Date   Blood transfusion    Diabetes mellitus    Family history of breast cancer    Family history of colon cancer    Family history of prostate cancer    Heart murmur    Hypertension     SURGICAL HISTORY: Past Surgical History:  Procedure Laterality Date   BILIARY STENT PLACEMENT  01/18/2022   Procedure: BILIARY STENT PLACEMENT;  Surgeon: Jeani Hawking, MD;  Location: [facility] ENDOSCOPY;  Service: Gastroenterology;;  DILATION AND CURETTAGE OF UTERUS     ERCP N/A 01/18/2022   Procedure: ENDOSCOPIC RETROGRADE CHOLANGIOPANCREATOGRAPHY (ERCP);  Surgeon: Carol Ada, MD;  Location: [place];  Service: Gastroenterology;  Laterality: N/A;   ESOPHAGOGASTRODUODENOSCOPY N/A 01/18/2022   Procedure: ESOPHAGOGASTRODUODENOSCOPY (EGD);  Surgeon: Carol Ada, MD;  Location: [place];  Service: Gastroenterology;  Laterality: N/A;   EUS  01/18/2022    Procedure: UPPER ENDOSCOPIC ULTRASOUND (EUS) LINEAR;  Surgeon: Carol Ada, MD;  Location[facility] ENDOSCOPY;  Service: Gastroenterology;;   FINE NEEDLE ASPIRATION  01/18/2022   Procedure: FINE NEEDLE ASPIRATION (FNA) LINEAR;  Surgeon: Carol Ada, MD;  Location: [facility] ENDOSCOPY;  Service: Gastroenterology;;   IR IMAGING GUIDED PORT INSERTION  02/15/2022   NO PAST SURGERIES     SPHINCTEROTOMY  01/18/2022   Procedure: Joan Mayans;  Surgeon: Carol Ada, MD;  Location: [place];  Service: Gastroenterology;;    I have reviewed the social history and family history with the patient and they are unchanged from previous note.  ALLERGIES:  has No Known Allergies.  MEDICATIONS:  Current Outpatient Medications  Medication Sig Dispense Refill   acetaminophen (TYLENOL) 500 MG tablet Take 1,000 mg by mouth every 6 (six) hours as needed for moderate pain or headache.     amLODipine (NORVASC) 5 MG tablet Take 5 mg by mouth daily.     Cholecalciferol (QC VITAMIN D3) 50 MCG (2000 UT) TABS Take 2,000 Units by mouth daily.     gabapentin (NEURONTIN) 100 MG capsule Take 2-3 capsules (200-300 mg total) by mouth at bedtime. 90 capsule 0   KLOR-CON M20 20 MEQ tablet Take 20 mEq by mouth 2 (two) times daily.     lidocaine-prilocaine (EMLA) cream Apply 1 application topically as needed. 30 g 1   NOVOLOG FLEXPEN 100 UNIT/ML FlexPen Inject 15 Units into the skin 2 (two) times daily.     oxyCODONE (OXY IR/ROXICODONE) 5 MG immediate release tablet Take 1 tablet (5 mg total) by mouth every 3 (three) hours as needed for severe pain. 20 tablet 0   polyethylene glycol (MIRALAX / GLYCOLAX) 17 g packet Take 17 g by mouth daily. 14 each 0   No current facility-administered medications for this visit.   Facility-Administered Medications Ordered in Other Visits  Medication Dose Route Frequency Provider Last Rate Last Admin   sodium chloride flush (NS) 0.9 % injection 10 mL  10 mL Intracatheter PRN Truitt Merle, MD   10 mL  at 08/02/22 1348    PHYSICAL EXAMINATION: ECOG PERFORMANCE STATUS: 1 - Symptomatic but completely ambulatory  There were no vitals filed for this visit. Wt Readings from Last 3 Encounters:  08/02/22 148 lb 8 oz (67.4 kg)  07/19/22 146 lb 6.4 oz (66.4 kg)  07/12/22 145 lb 12.8 oz (66.1 kg)     GENERAL:alert, no distress and comfortable SKIN: skin color normal, no rashes or significant lesions EYES: normal, Conjunctiva are pink and non-injected, sclera clear  NEURO: alert & oriented x 3 with fluent speech  LABORATORY DATA:  I have reviewed the data as listed    Latest Ref Rng & Units 08/02/2022   10:14 AM 07/19/2022    1:28 PM 07/12/2022   12:06 PM  CBC  WBC 4.0 - 10.5 K/uL 6.9  4.4  6.3   Hemoglobin 12.0 - 15.0 g/dL 9.2  9.0  9.4   Hematocrit 36.0 - 46.0 % 28.2  26.6  28.4   Platelets 150 - 400 K/uL 233  237  274  Latest Ref Rng & Units 08/02/2022   10:14 AM 07/19/2022    1:28 PM 07/12/2022   12:06 PM  CMP  Glucose 70 - 99 mg/dL 263  162  186   BUN 8 - 23 mg/dL $Remove'13  13  12   'pDIeoZi$ Creatinine 0.44 - 1.00 mg/dL 0.60  0.53  0.50   Sodium 135 - 145 mmol/L 139  139  140   Potassium 3.5 - 5.1 mmol/L 3.9  3.7  3.8   Chloride 98 - 111 mmol/L 108  108  108   CO2 22 - 32 mmol/L $RemoveB'26  28  27   'JQurWPES$ Calcium 8.9 - 10.3 mg/dL 8.7  9.0  9.2   Total Protein 6.5 - 8.1 g/dL 6.1  6.4  6.4   Total Bilirubin 0.3 - 1.2 mg/dL 0.3  0.3  0.3   Alkaline Phos 38 - 126 U/L 161  144  149   AST 15 - 41 U/L $Remo'16  17  21   'WTBIt$ ALT 0 - 44 U/L $Remo'14  15  17       'OhqDr$ RADIOGRAPHIC STUDIES: I have personally reviewed the radiological images as listed and agreed with the findings in the report. No results found.    No orders of the defined types were placed in this encounter.  All questions were answered. The patient knows to call the clinic with any problems, questions or concerns. No barriers to learning was detected. The total time spent in the appointment was 30 minutes.     Truitt Merle, MD 08/02/2022   I, Wilburn Mylar, am acting as scribe for Truitt Merle, MD.   I have reviewed the above documentation for accuracy and completeness, and I agree with the above.

## 2022-08-02 NOTE — Patient Instructions (Signed)
[facility] MEDICAL ONCOLOGY  Discharge Instructions: Thank you for choosing [facility] to provide your oncology and hematology care.   If you have a lab appointment with the Cancer Center, please go directly to the Cancer Center and check in at the registration area.   Wear comfortable clothing and clothing appropriate for easy access to any Portacath or PICC line.   We strive to give you quality time with your provider. You may need to reschedule your appointment if you arrive late (15 or more minutes).  Arriving late affects you and other patients whose appointments are after yours.  Also, if you miss three or more appointments without notifying the office, you may be dismissed from the clinic at the provider's discretion.      For prescription refill requests, have your pharmacy contact our office and allow 72 hours for refills to be completed.    Today you received the following chemotherapy and/or immunotherapy agents: Abraxane and Gemzar      To help prevent nausea and vomiting after your treatment, we encourage you to take your nausea medication as directed.  BELOW ARE SYMPTOMS THAT SHOULD BE REPORTED IMMEDIATELY: *FEVER GREATER THAN 100.4 F (38 C) OR HIGHER *CHILLS OR SWEATING *NAUSEA AND VOMITING THAT IS NOT CONTROLLED WITH YOUR NAUSEA MEDICATION *UNUSUAL SHORTNESS OF BREATH *UNUSUAL BRUISING OR BLEEDING *URINARY PROBLEMS (pain or burning when urinating, or frequent urination) *BOWEL PROBLEMS (unusual diarrhea, constipation, pain near the anus) TENDERNESS IN MOUTH AND THROAT WITH OR WITHOUT PRESENCE OF ULCERS (sore throat, sores in mouth, or a toothache) UNUSUAL RASH, SWELLING OR PAIN  UNUSUAL VAGINAL DISCHARGE OR ITCHING   Items with * indicate a potential emergency and should be followed up as soon as possible or go to the Emergency Department if any problems should occur.  Please show the CHEMOTHERAPY ALERT CARD or IMMUNOTHERAPY ALERT CARD at  check-in to the Emergency Department and triage nurse.  Should you have questions after your visit or need to cancel or reschedule your appointment, please contact [facility]ICAL ONCOLOGY  Dept: [phone]  and follow the prompts.  Office hours are 8:00 a.m. to 4:30 p.m. Monday - Friday. Please note that voicemails left after 4:00 p.m. may not be returned until the following business day.  We are closed weekends and major holidays. You have access to a nurse at all times for urgent questions. Please call the main number to the clinic Dept: [phone] and follow the prompts.   For any non-urgent questions, you may also contact your provider using MyChart. We now offer e-Visits for anyone 18 and older to request care online for non-urgent symptoms. For details visit [email or web address].   Also download the MyChart app! Go to the app store, search "MyChart", open the app, select , and log in with your MyChart username and password.  Masks are optional in the cancer centers. If you would like for your care team to wear a mask while they are taking care of you, please let them know. You may have one support person who is at least 75 years old accompany you for your appointments. 

## 2022-08-03 LAB — CANCER ANTIGEN 19-9: CA 19-9: 2976 U/mL — ABNORMAL HIGH (ref 0–35)

## 2022-08-08 MED FILL — Dexamethasone Sodium Phosphate Inj 100 MG/10ML: INTRAMUSCULAR | Qty: 1 | Status: AC

## 2022-08-08 MED FILL — Fosaprepitant Dimeglumine For IV Infusion 150 MG (Base Eq): INTRAVENOUS | Qty: 5 | Status: AC

## 2022-08-09 ENCOUNTER — Inpatient Hospital Stay: Payer: Medicare HMO

## 2022-08-09 ENCOUNTER — Other Ambulatory Visit: Payer: Self-pay

## 2022-08-09 VITALS — BP 135/80 | HR 63 | Temp 98.5°F | Resp 16 | Wt 147.8 lb

## 2022-08-09 DIAGNOSIS — C250 Malignant neoplasm of head of pancreas: Secondary | ICD-10-CM

## 2022-08-09 DIAGNOSIS — Z5111 Encounter for antineoplastic chemotherapy: Secondary | ICD-10-CM | POA: Diagnosis not present

## 2022-08-09 DIAGNOSIS — Z95828 Presence of other vascular implants and grafts: Secondary | ICD-10-CM

## 2022-08-09 LAB — CBC WITH DIFFERENTIAL (CANCER CENTER ONLY)
Abs Immature Granulocytes: 0.02 10*3/uL (ref 0.00–0.07)
Basophils Absolute: 0 10*3/uL (ref 0.0–0.1)
Basophils Relative: 1 %
Eosinophils Absolute: 0.1 10*3/uL (ref 0.0–0.5)
Eosinophils Relative: 1 %
HCT: 26.6 % — ABNORMAL LOW (ref 36.0–46.0)
Hemoglobin: 8.8 g/dL — ABNORMAL LOW (ref 12.0–15.0)
Immature Granulocytes: 1 %
Lymphocytes Relative: 52 %
Lymphs Abs: 2.3 10*3/uL (ref 0.7–4.0)
MCH: 30.3 pg (ref 26.0–34.0)
MCHC: 33.1 g/dL (ref 30.0–36.0)
MCV: 91.7 fL (ref 80.0–100.0)
Monocytes Absolute: 0.4 10*3/uL (ref 0.1–1.0)
Monocytes Relative: 8 %
Neutro Abs: 1.6 10*3/uL — ABNORMAL LOW (ref 1.7–7.7)
Neutrophils Relative %: 37 %
Platelet Count: 204 10*3/uL (ref 150–400)
RBC: 2.9 MIL/uL — ABNORMAL LOW (ref 3.87–5.11)
RDW: 17.6 % — ABNORMAL HIGH (ref 11.5–15.5)
WBC Count: 4.4 10*3/uL (ref 4.0–10.5)
nRBC: 0 % (ref 0.0–0.2)

## 2022-08-09 LAB — CMP (CANCER CENTER ONLY)
ALT: 14 U/L (ref 0–44)
AST: 15 U/L (ref 15–41)
Albumin: 3.4 g/dL — ABNORMAL LOW (ref 3.5–5.0)
Alkaline Phosphatase: 166 U/L — ABNORMAL HIGH (ref 38–126)
Anion gap: 7 (ref 5–15)
BUN: 11 mg/dL (ref 8–23)
CO2: 27 mmol/L (ref 22–32)
Calcium: 8.7 mg/dL — ABNORMAL LOW (ref 8.9–10.3)
Chloride: 106 mmol/L (ref 98–111)
Creatinine: 0.46 mg/dL (ref 0.44–1.00)
GFR, Estimated: >60 mL/min (ref >60)
Glucose, Bld: 166 mg/dL — ABNORMAL HIGH (ref 70–99)
Potassium: 3.8 mmol/L (ref 3.5–5.1)
Sodium: 140 mmol/L (ref 135–145)
Total Bilirubin: 0.3 mg/dL (ref 0.3–1.2)
Total Protein: 6.3 g/dL — ABNORMAL LOW (ref 6.5–8.1)

## 2022-08-09 MED ORDER — PALONOSETRON HCL INJECTION 0.25 MG/5ML
0.2500 mg | Freq: Once | INTRAVENOUS | Status: AC
  Administered 2022-08-09: 0.25 mg via INTRAVENOUS
  Filled 2022-08-09: qty 5

## 2022-08-09 MED ORDER — SODIUM CHLORIDE 0.9% FLUSH
10.0000 mL | INTRAVENOUS | Status: DC | PRN
  Administered 2022-08-09: 10 mL

## 2022-08-09 MED ORDER — SODIUM CHLORIDE 0.9 % IV SOLN
1000.0000 mg/m2 | Freq: Once | INTRAVENOUS | Status: AC
  Administered 2022-08-09: 1710 mg via INTRAVENOUS
  Filled 2022-08-09: qty 44.97

## 2022-08-09 MED ORDER — HEPARIN SOD (PORK) LOCK FLUSH 100 UNIT/ML IV SOLN
500.0000 [IU] | Freq: Once | INTRAVENOUS | Status: AC | PRN
  Administered 2022-08-09: 500 [IU]

## 2022-08-09 MED ORDER — SODIUM CHLORIDE 0.9 % IV SOLN: Freq: Once | INTRAVENOUS | Status: AC

## 2022-08-09 MED ORDER — PROCHLORPERAZINE MALEATE 10 MG PO TABS
10.0000 mg | ORAL_TABLET | Freq: Once | ORAL | Status: AC
  Administered 2022-08-09: 10 mg via ORAL
  Filled 2022-08-09: qty 1

## 2022-08-09 MED ORDER — SODIUM CHLORIDE 0.9% FLUSH
10.0000 mL | Freq: Once | INTRAVENOUS | Status: AC
  Administered 2022-08-09: 10 mL

## 2022-08-09 MED ORDER — SODIUM CHLORIDE 0.9 % IV SOLN
150.0000 mg | Freq: Once | INTRAVENOUS | Status: AC
  Administered 2022-08-09: 150 mg via INTRAVENOUS
  Filled 2022-08-09: qty 150

## 2022-08-09 MED ORDER — SODIUM CHLORIDE 0.9 % IV SOLN
10.0000 mg | Freq: Once | INTRAVENOUS | Status: AC
  Administered 2022-08-09: 10 mg via INTRAVENOUS
  Filled 2022-08-09: qty 10

## 2022-08-09 MED ORDER — PACLITAXEL PROTEIN-BOUND CHEMO INJECTION 100 MG
80.0000 mg/m2 | Freq: Once | INTRAVENOUS | Status: AC
  Administered 2022-08-09: 125 mg via INTRAVENOUS
  Filled 2022-08-09: qty 25

## 2022-08-09 NOTE — Patient Instructions (Signed)
[facility] MEDICAL ONCOLOGY  Discharge Instructions: Thank you for choosing [facility] to provide your oncology and hematology care.   If you have a lab appointment with the Cancer Center, please go directly to the Cancer Center and check in at the registration area.   Wear comfortable clothing and clothing appropriate for easy access to any Portacath or PICC line.   We strive to give you quality time with your provider. You may need to reschedule your appointment if you arrive late (15 or more minutes).  Arriving late affects you and other patients whose appointments are after yours.  Also, if you miss three or more appointments without notifying the office, you may be dismissed from the clinic at the provider's discretion.      For prescription refill requests, have your pharmacy contact our office and allow 72 hours for refills to be completed.    Today you received the following chemotherapy and/or immunotherapy agents: paclitaxel-protein bound and gemcitabine      To help prevent nausea and vomiting after your treatment, we encourage you to take your nausea medication as directed.  BELOW ARE SYMPTOMS THAT SHOULD BE REPORTED IMMEDIATELY: *FEVER GREATER THAN 100.4 F (38 C) OR HIGHER *CHILLS OR SWEATING *NAUSEA AND VOMITING THAT IS NOT CONTROLLED WITH YOUR NAUSEA MEDICATION *UNUSUAL SHORTNESS OF BREATH *UNUSUAL BRUISING OR BLEEDING *URINARY PROBLEMS (pain or burning when urinating, or frequent urination) *BOWEL PROBLEMS (unusual diarrhea, constipation, pain near the anus) TENDERNESS IN MOUTH AND THROAT WITH OR WITHOUT PRESENCE OF ULCERS (sore throat, sores in mouth, or a toothache) UNUSUAL RASH, SWELLING OR PAIN  UNUSUAL VAGINAL DISCHARGE OR ITCHING   Items with * indicate a potential emergency and should be followed up as soon as possible or go to the Emergency Department if any problems should occur.  Please show the CHEMOTHERAPY ALERT CARD or  IMMUNOTHERAPY ALERT CARD at check-in to the Emergency Department and triage nurse.  Should you have questions after your visit or need to cancel or reschedule your appointment, please contact [facility] MEDICAL ONCOLOG[facility]  Dept: [phone]  and follow the prompts.  Office hours are 8:00 a.m. to 4:30 p.m. Monday - Friday. Please note that voicemails left after 4:00 p.m. may not be returned until the following business day.  We are closed weekends and major holidays. You have access to a nurse at all times for urgent questions. Please call the main number to the clinic Dept: [phone] and follow the prompts.   For any non-urgent questions, you may also contact your provider using MyChart. We now offer e-Visits for anyone 18 and older to request care online for non-urgent symptoms. For details visit [email or web address].   Also download the MyChart app! Go to the app store, search "MyChart", open the app, select , and log in with your MyChart username and password.  Masks are optional in the cancer centers. If you would like for your care team to wear a mask while they are taking care of you, please let them know. You may have one support person who is at least 75 years old accompany you for your appointments. 

## 2022-08-29 MED FILL — Fosaprepitant Dimeglumine For IV Infusion 150 MG (Base Eq): INTRAVENOUS | Qty: 5 | Status: AC

## 2022-08-29 MED FILL — Dexamethasone Sodium Phosphate Inj 100 MG/10ML: INTRAMUSCULAR | Qty: 1 | Status: AC

## 2022-08-30 ENCOUNTER — Other Ambulatory Visit: Payer: Self-pay

## 2022-08-30 ENCOUNTER — Ambulatory Visit (HOSPITAL_COMMUNITY)
Admission: RE | Admit: 2022-08-30 | Discharge: 2022-08-30 | Disposition: A | Discharge status: Home / Self Care | Payer: Medicare HMO | Source: Ambulatory Visit | Attending: Adult Health | Admitting: Adult Health

## 2022-08-30 ENCOUNTER — Inpatient Hospital Stay (HOSPITAL_BASED_OUTPATIENT_CLINIC_OR_DEPARTMENT_OTHER): Payer: Medicare HMO | Admitting: Adult Health

## 2022-08-30 ENCOUNTER — Inpatient Hospital Stay: Payer: Medicare HMO | Attending: Physician Assistant

## 2022-08-30 ENCOUNTER — Encounter: Payer: Self-pay | Admitting: Adult Health

## 2022-08-30 ENCOUNTER — Inpatient Hospital Stay: Payer: Medicare HMO

## 2022-08-30 VITALS — BP 163/87 | HR 62 | Temp 97.7°F | Resp 18 | Wt 149.0 lb

## 2022-08-30 VITALS — BP 139/78 | HR 63

## 2022-08-30 DIAGNOSIS — C250 Malignant neoplasm of head of pancreas: Secondary | ICD-10-CM

## 2022-08-30 DIAGNOSIS — Z5111 Encounter for antineoplastic chemotherapy: Secondary | ICD-10-CM | POA: Diagnosis present

## 2022-08-30 DIAGNOSIS — Z95828 Presence of other vascular implants and grafts: Secondary | ICD-10-CM

## 2022-08-30 DIAGNOSIS — Z79899 Other long term (current) drug therapy: Secondary | ICD-10-CM | POA: Diagnosis not present

## 2022-08-30 DIAGNOSIS — M25512 Pain in left shoulder: Secondary | ICD-10-CM

## 2022-08-30 LAB — CMP (CANCER CENTER ONLY)
ALT: 10 U/L (ref 0–44)
AST: 14 U/L — ABNORMAL LOW (ref 15–41)
Albumin: 3.5 g/dL (ref 3.5–5.0)
Alkaline Phosphatase: 126 U/L (ref 38–126)
Anion gap: 5 (ref 5–15)
BUN: 12 mg/dL (ref 8–23)
CO2: 27 mmol/L (ref 22–32)
Calcium: 9 mg/dL (ref 8.9–10.3)
Chloride: 107 mmol/L (ref 98–111)
Creatinine: 0.54 mg/dL (ref 0.44–1.00)
GFR, Estimated: >60 mL/min (ref >60)
Glucose, Bld: 200 mg/dL — ABNORMAL HIGH (ref 70–99)
Potassium: 3.7 mmol/L (ref 3.5–5.1)
Sodium: 139 mmol/L (ref 135–145)
Total Bilirubin: 0.3 mg/dL (ref 0.3–1.2)
Total Protein: 6.4 g/dL — ABNORMAL LOW (ref 6.5–8.1)

## 2022-08-30 LAB — CBC WITH DIFFERENTIAL (CANCER CENTER ONLY)
Abs Immature Granulocytes: 0.01 10*3/uL (ref 0.00–0.07)
Basophils Absolute: 0.1 10*3/uL (ref 0.0–0.1)
Basophils Relative: 1 %
Eosinophils Absolute: 0.1 10*3/uL (ref 0.0–0.5)
Eosinophils Relative: 1 %
HCT: 29.2 % — ABNORMAL LOW (ref 36.0–46.0)
Hemoglobin: 9.7 g/dL — ABNORMAL LOW (ref 12.0–15.0)
Immature Granulocytes: 0 %
Lymphocytes Relative: 37 %
Lymphs Abs: 2.3 10*3/uL (ref 0.7–4.0)
MCH: 30.4 pg (ref 26.0–34.0)
MCHC: 33.2 g/dL (ref 30.0–36.0)
MCV: 91.5 fL (ref 80.0–100.0)
Monocytes Absolute: 0.4 10*3/uL (ref 0.1–1.0)
Monocytes Relative: 7 %
Neutro Abs: 3.4 10*3/uL (ref 1.7–7.7)
Neutrophils Relative %: 54 %
Platelet Count: 260 10*3/uL (ref 150–400)
RBC: 3.19 MIL/uL — ABNORMAL LOW (ref 3.87–5.11)
RDW: 18 % — ABNORMAL HIGH (ref 11.5–15.5)
WBC Count: 6.3 10*3/uL (ref 4.0–10.5)
nRBC: 0 % (ref 0.0–0.2)

## 2022-08-30 MED ORDER — SODIUM CHLORIDE 0.9 % IV SOLN: Freq: Once | INTRAVENOUS | Status: AC

## 2022-08-30 MED ORDER — SODIUM CHLORIDE 0.9% FLUSH
10.0000 mL | INTRAVENOUS | Status: DC | PRN
  Administered 2022-08-30: 10 mL

## 2022-08-30 MED ORDER — SODIUM CHLORIDE 0.9% FLUSH
10.0000 mL | Freq: Once | INTRAVENOUS | Status: AC
  Administered 2022-08-30: 10 mL

## 2022-08-30 MED ORDER — PACLITAXEL PROTEIN-BOUND CHEMO INJECTION 100 MG
80.0000 mg/m2 | Freq: Once | INTRAVENOUS | Status: AC
  Administered 2022-08-30: 125 mg via INTRAVENOUS
  Filled 2022-08-30: qty 25

## 2022-08-30 MED ORDER — HEPARIN SOD (PORK) LOCK FLUSH 100 UNIT/ML IV SOLN
500.0000 [IU] | Freq: Once | INTRAVENOUS | Status: AC | PRN
  Administered 2022-08-30: 500 [IU]

## 2022-08-30 MED ORDER — SODIUM CHLORIDE 0.9 % IV SOLN
10.0000 mg | Freq: Once | INTRAVENOUS | Status: AC
  Administered 2022-08-30: 10 mg via INTRAVENOUS
  Filled 2022-08-30: qty 10

## 2022-08-30 MED ORDER — PALONOSETRON HCL INJECTION 0.25 MG/5ML
0.2500 mg | Freq: Once | INTRAVENOUS | Status: AC
  Administered 2022-08-30: 0.25 mg via INTRAVENOUS
  Filled 2022-08-30: qty 5

## 2022-08-30 MED ORDER — SODIUM CHLORIDE 0.9 % IV SOLN
1000.0000 mg/m2 | Freq: Once | INTRAVENOUS | Status: AC
  Administered 2022-08-30: 1710 mg via INTRAVENOUS
  Filled 2022-08-30: qty 44.97

## 2022-08-30 MED ORDER — PROCHLORPERAZINE MALEATE 10 MG PO TABS
10.0000 mg | ORAL_TABLET | Freq: Once | ORAL | Status: AC
  Administered 2022-08-30: 10 mg via ORAL
  Filled 2022-08-30: qty 1

## 2022-08-30 MED ORDER — SODIUM CHLORIDE 0.9 % IV SOLN
150.0000 mg | Freq: Once | INTRAVENOUS | Status: AC
  Administered 2022-08-30: 150 mg via INTRAVENOUS
  Filled 2022-08-30: qty 150

## 2022-08-30 NOTE — Patient Instructions (Signed)
[facility]COLOG[facility]   Discharge Instructions: Thank you for choosing [facility] to provide your oncology and hematology care.   If you have a lab appointment with the Cancer Center, please go directly to the Cancer Center and check in at the registration area.   Wear comfortable clothing and clothing appropriate for easy access to any Portacath or PICC line.   We strive to give you quality time with your provider. You may need to reschedule your appointment if you arrive late (15 or more minutes).  Arriving late affects you and other patients whose appointments are after yours.  Also, if you miss three or more appointments without notifying the office, you may be dismissed from the clinic at the provider's discretion.      For prescription refill requests, have your pharmacy contact our office and allow 72 hours for refills to be completed.    Today you received the following chemotherapy and/or immunotherapy agents: Paclitaxel-protein bound (Abraxane) and Gemcitabine (Gemzar)     To help prevent nausea and vomiting after your treatment, we encourage you to take your nausea medication as directed.  BELOW ARE SYMPTOMS THAT SHOULD BE REPORTED IMMEDIATELY: *FEVER GREATER THAN 100.4 F (38 C) OR HIGHER *CHILLS OR SWEATING *NAUSEA AND VOMITING THAT IS NOT CONTROLLED WITH YOUR NAUSEA MEDICATION *UNUSUAL SHORTNESS OF BREATH *UNUSUAL BRUISING OR BLEEDING *URINARY PROBLEMS (pain or burning when urinating, or frequent urination) *BOWEL PROBLEMS (unusual diarrhea, constipation, pain near the anus) TENDERNESS IN MOUTH AND THROAT WITH OR WITHOUT PRESENCE OF ULCERS (sore throat, sores in mouth, or a toothache) UNUSUAL RASH, SWELLING OR PAIN  UNUSUAL VAGINAL DISCHARGE OR ITCHING   Items with * indicate a potential emergency and should be followed up as soon as possible or go to the Emergency Department if any problems should occur.  Please show the CHEMOTHERAPY  ALERT CARD or IMMUNOTHERAPY ALERT CARD at check-in to the Emergency Department and triage nurse.  Should you have questions after your visit or need to cancel or reschedule your appointment, please contact [facility] MEDICAL ONCOLOGY  Dept: [phone]  and follow the prompts.  Office hours are 8:00 a.m. to 4:30 p.m. Monday - Friday. Please note that voicemails left after 4:00 p.m. may not be returned until the following business day.  We are closed weekends and major holidays. You have access to a nurse at all times for urgent questions. Please call the main number to the clinic Dept: [phone] and follow the prompts.   For any non-urgent questions, you may also contact your provider using MyChart. We now offer e-Visits for anyone 18 and older to request care online for non-urgent symptoms. For details visit [email or web address].   Also download the MyChart app! Go to the app store, search "MyChart", open the app, select [place], and log in with your MyChart username and password.  Masks are optional in the cancer centers. If you would like for your care team to wear a mask while they are taking care of you, please let them know. You may have one support person who is at least 75 years old accompany you for your appointments. 

## 2022-08-30 NOTE — Progress Notes (Signed)
[facility] Follow up:    Miranda Spanish, MD Miranda Francis [place] [id number]   DIAGNOSIS:  Cancer Staging  Pancreatic cancer [facility]) Staging form: Exocrine Pancreas, AJCC 8th Edition - Clinical stage from 01/18/2022: Stage IB (cT2, cN0, cM0) - Signed by Miranda Merle, MD on 01/28/2022 Stage prefix: Initial diagnosis Total positive nodes: 0   SUMMARY OF ONCOLOGIC HISTORY: Oncology History Overview Note   Cancer Staging  Pancreatic cancer [facility]) Staging form: Exocrine Pancreas, AJCC 8th Edition - Clinical stage from 01/18/2022: Stage IB (cT2, cN0, cM0) - Signed by Miranda Merle, MD on 01/28/2022    Pancreatic cancer ([place])  01/16/2022 Imaging   CLINICAL DATA:  Jaundice   EXAM: CT ABDOMEN AND PELVIS WITH CONTRAST  IMPRESSION: 1. Marked intra and extrahepatic biliary dilatation with marked dilatation of pancreatic duct and distended gallbladder. There is significant atrophy of the body and tail of pancreas. 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 2. Diverticular disease of the colon without acute wall thickening 3. Mild endometrial thickening up to 9 mm, recommend nonemergent pelvic ultrasound for further evaluation   01/17/2022 Imaging   EXAM: MRI ABDOMEN WITHOUT AND WITH CONTRAST (INCLUDING MRCP)  IMPRESSION: 1. Obstructing mass within the pancreatic head with an intraluminal component demonstrating low level enhancement. 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 2. This mass causes significant extrinsic narrowing at the portal/superior mesenteric vein confluence as well as marked intra and extrahepatic biliary dilatation. 3. No distant metastases  identified. 4. Endoscopy for endoscopic ultrasound, tissue sampling and biliary decompression recommended.   01/18/2022 Cancer Staging   Staging form: Exocrine Pancreas, AJCC 8th Edition - Clinical stage from 01/18/2022: Stage IB (cT2, cN0, cM0) - Signed by Miranda Merle, MD on 01/28/2022 Stage prefix: Initial diagnosis Total positive nodes: 0   01/18/2022 Initial Biopsy   FINAL MICROSCOPIC DIAGNOSIS:  A. PANCREAS, HEAD, FINE NEEDLE ASPIRATION:  - Malignant cells present consistent with adenocarcinoma   01/24/2022 Imaging   EXAM: CT CHEST WITHOUT CONTRAST  IMPRESSION: 1. No evidence of metastatic disease in the chest. 2. Atelectasis or consolidation in the right lower lung. 3. Healing right rib fractures. 4. Mass in the head of the pancreas. See previous report of CT abdomen and pelvis 01/16/2022. Interval placement of the biliary stent.   01/28/2022 Initial Diagnosis   Pancreatic cancer ([place])   02/16/2022 - 06/28/2022 Chemotherapy   Patient is on Treatment Plan : PANCREATIC Abraxane / Gemcitabine D1,8 q21d     02/16/2022 Genetic Testing   Negative genetic testing on the CancerNext-Expanded+RNAinsight panel.  The report date is February 16, 2022.  The CancerNext-Expanded gene panel offered by [facility] and includes sequencing and rearrangement analysis for the following 77 genes: AIP, ALK, APC*, ATM*, AXIN2, BAP1, BARD1, BLM, BMPR1A, BRCA1*, BRCA2*, BRIP1*, CDC73, CDH1*, CDK4, CDKN1B, CDKN2A, CHEK2*, CTNNA1, DICER1, FANCC, FH, FLCN, GALNT12, KIF1B, LZTR1, MAX, MEN1, MET, MLH1*, MSH2*, MSH3, MSH6*, MUTYH*, NBN, NF1*, NF2, NTHL1, PALB2*, PHOX2B, PMS2*, POT1, PRKAR1A, PTCH1, PTEN*, RAD51C*, RAD51D*, RB1, RECQL, RET, SDHA, SDHAF2, SDHB, SDHC, SDHD, SMAD4, SMARCA4, SMARCB1, SMARCE1, STK11, SUFU, TMEM127, TP53*, TSC1, TSC2, VHL and XRCC2 (sequencing and deletion/duplication); EGFR, EGLN1, HOXB13, KIT, MITF, PDGFRA, POLD1, and POLE (sequencing only); EPCAM and GREM1 (deletion/duplication only). DNA and  RNA  analyses performed for * genes.    06/14/2022 -  Chemotherapy   Patient is on Treatment Plan : PANCREATIC Abraxane D1,8,15 + Gemcitabine D1,8,15 q28d       CURRENT THERAPY: Abraxane/Gemcitabine  INTERVAL HISTORY: Miranda Francis 75 y.o. female returns for f/u prior to receiving chemotherapy today with Abraxane and Gemcitabine.  She has some mild neuropathy in her toes.  She is wearing compression stockings to help with this.    She has also noted some pain in her left shoulder radiating down her left arm. She denies any precipitating factor and notes the pain is intermittent.  She denies any specific pattern of her pain.  She has a personal history of HTN and diabetes, and a family history of heart disease--none early however.     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   Port-A-Cath in place 03/14/2022   Genetic testing 02/16/2022   Family history of colon cancer 02/07/2022   Family history of breast cancer 02/07/2022   Family history of prostate cancer 02/07/2022   Pancreatic cancer ([place]) 01/28/2022   Normocytic anemia 01/22/2022   Post-ERCP acute pancreatitis 01/19/2022   Malnutrition of moderate degree 01/18/2022   Pancreatic mass 01/17/2022   Elevated liver enzymes 01/17/2022   Hypertension, benign essential, goal below 140/90 09/28/2015   Hypokalemia 11/01/2011   Weakness generalized 11/01/2011   Serum lipase elevation 11/01/2011   Nausea & vomiting 11/01/2011   Diarrhea 11/01/2011   Endometritis 11/01/2011   DM (diabetes mellitus) ([place]) 11/01/2011   HTN (hypertension) 11/01/2011   Hyperlipidemia 11/01/2011   Obesity 11/01/2011    has No Known Allergies.  MEDICAL HISTORY: Past Medical History:  Diagnosis Date   Blood transfusion    Diabetes mellitus    Family history of breast cancer    Family history of colon cancer    Family history of prostate cancer    Heart murmur    Hypertension      SURGICAL HISTORY: Past Surgical History:  Procedure Laterality Date   BILIARY STENT PLACEMENT  01/18/2022   Procedure: BILIARY STENT PLACEMENT;  Surgeon: Miranda Ada, MD;  Location: [place];  Service: Gastroenterology;;   DILATION AND CURETTAGE OF UTERUS     ERCP N/A 01/18/2022   Procedure: ENDOSCOPIC RETROGRADE CHOLANGIOPANCREATOGRAPHY (ERCP);  Surgeon: Miranda Ada, MD;  Location: [place];  Service: Gastroenterology;  Laterality: N/A;   ESOPHAGOGASTRODUODENOSCOPY N/A 01/18/2022   Procedure: ESOPHAGOGASTRODUODENOSCOPY (EGD);  Surgeon: Miranda Ada, MD;  Location: [place];  Service: Gastroenterology;  Laterality: N/A;   EUS  01/18/2022   Procedure: UPPER ENDOSCOPIC ULTRASOUND (EUS) LINEAR;  Surgeon: Miranda Ada, MD;  Location: [place];  Service: Gastroenterology;;   FINE NEEDLE ASPIRATION  01/18/2022   Procedure: FINE NEEDLE ASPIRATION (FNA) LINEAR;  Surgeon: Miranda Ada, MD;  Location: [place];  Service: Gastroenterology;;   IR IMAGING GUIDED PORT INSERTION  02/15/2022   NO PAST SURGERIES     SPHINCTEROTOMY  01/18/2022   Procedure: Joan Mayans;  Surgeon: Miranda Ada, MD;  Location[facility];  Service: Gastroenterology;;    SOCIAL HISTORY: Social History   Socioeconomic History   Marital status: Married    Spouse name: Not on file   Number of children: 2   Years of education: Not on file   Highest education level: Not on file  Occupational History   Not on file  Tobacco Use   Smoking status: Never   Smokeless tobacco: Never  Substance  and Sexual Activity   Alcohol use: Never   Drug use: Never   Sexual activity: Not on file  Other Topics Concern   Not on file  Social History Narrative   Not on file   Social Determinants of Health   Financial Resource Strain: Not on file  Food Insecurity: Not on file  Transportation Needs: Not on file  Physical Activity: Not on file  Stress: Not on file  Social Connections: Not on file  Intimate  Partner Violence: Not on file    FAMILY HISTORY: Family History  Problem Relation Age of Onset   Diabetes type II Mother    Stroke Father    Diabetes type II Sister    Colon cancer Sister 51   Colon cancer Brother 43   Breast cancer Maternal Aunt 90   Breast cancer Maternal Aunt 94   Prostate cancer Maternal Uncle    Heart attack Maternal Grandmother     Review of Systems  Constitutional:  Negative for appetite change, chills, fatigue, fever and unexpected weight change.  HENT:   Negative for hearing loss, lump/mass and trouble swallowing.   Eyes:  Negative for eye problems and icterus.  Respiratory:  Negative for chest tightness, cough and shortness of breath.   Cardiovascular:  Negative for chest pain, leg swelling and palpitations.  Gastrointestinal:  Negative for abdominal distention, abdominal pain, constipation, diarrhea, nausea and vomiting.  Endocrine: Negative for hot flashes.  Genitourinary:  Negative for difficulty urinating.   Musculoskeletal:  Negative for arthralgias.  Skin:  Negative for itching and rash.  Neurological:  Positive for numbness. Negative for dizziness, extremity weakness and headaches.  Hematological:  Negative for adenopathy. Does not bruise/bleed easily.  Psychiatric/Behavioral:  Negative for depression. The patient is not nervous/anxious.       PHYSICAL EXAMINATION  ECOG PERFORMANCE STATUS: 1 - Symptomatic but completely ambulatory  Vitals:   08/30/22 1128  BP: (!) 163/87  Pulse: 62  Resp: 18  Temp: 97.7 F (36.5 C)  SpO2: 98%    Physical Exam Constitutional:      General: She is not in acute distress.    Appearance: Normal appearance. She is not toxic-appearing.  HENT:     Head: Normocephalic and atraumatic.  Eyes:     General: No scleral icterus. Cardiovascular:     Rate and Rhythm: Normal rate and regular rhythm.     Pulses: Normal pulses.     Heart sounds: Normal heart sounds.  Pulmonary:     Effort: Pulmonary effort is  normal.     Breath sounds: Normal breath sounds.  Abdominal:     General: Abdomen is flat. Bowel sounds are normal. There is no distension.     Palpations: Abdomen is soft.     Tenderness: There is no abdominal tenderness.  Musculoskeletal:        General: No swelling.     Cervical back: Neck supple.     Comments: Left shoulder without any TTP to scapula, acromion, coracoid process, rotator cuff, or subacromial space.  No TTP along cervical spine or along trapezius muscle, or deltoid muscle.  Lymphadenopathy:     Cervical: No cervical adenopathy.  Skin:    General: Skin is warm and dry.     Findings: No rash.  Neurological:     General: No focal deficit present.     Mental Status: She is alert.  Psychiatric:        Mood and Affect: Mood normal.  Behavior: Behavior normal.     LABORATORY DATA:  CBC    Component Value Date/Time   WBC 6.3 08/30/2022 1100   WBC 6.9 08/02/2022 1014   RBC 3.19 (L) 08/30/2022 1100   HGB 9.7 (L) 08/30/2022 1100   HCT 29.2 (L) 08/30/2022 1100   PLT 260 08/30/2022 1100   MCV 91.5 08/30/2022 1100   MCH 30.4 08/30/2022 1100   MCHC 33.2 08/30/2022 1100   RDW 18.0 (H) 08/30/2022 1100   LYMPHSABS 2.3 08/30/2022 1100   MONOABS 0.4 08/30/2022 1100   EOSABS 0.1 08/30/2022 1100   BASOSABS 0.1 08/30/2022 1100    CMP     Component Value Date/Time   NA 139 08/30/2022 1100   K 3.7 08/30/2022 1100   CL 107 08/30/2022 1100   CO2 27 08/30/2022 1100   GLUCOSE 200 (H) 08/30/2022 1100   BUN 12 08/30/2022 1100   CREATININE 0.54 08/30/2022 1100   CALCIUM 9.0 08/30/2022 1100   PROT 6.4 (L) 08/30/2022 1100   ALBUMIN 3.5 08/30/2022 1100   AST 14 (L) 08/30/2022 1100   ALT 10 08/30/2022 1100   ALKPHOS 126 08/30/2022 1100   BILITOT 0.3 08/30/2022 1100   GFRNONAA >60 08/30/2022 1100   GFRAA >60 10/31/2018 0937       ASSESSMENT and THERAPY PLAN:   Left shoulder pain The etiology of her new onset left shoulder pain is unclear.  I have ordered an  xray.  I also referred her to see Dr. Harl Bowie cardiology considering her diagnosis of diabetes, HTN, and FH of heart disease to rule out ischemic causes.  I reviewed the vague pain with Dr. Harl Bowie and she is helping me get the patient in on her schedule for evaluation.  I will f/u with patient on xray results and will place other orders as indicated at that time.  Pancreatic cancer [facility]) Miranda Francis is a 76 year old woman with pancreatic cancer on treatment with Gemcitabine and Abraxane.  She will proceed with day 1 cycle 4 of therapy.  I reviewed her labs with her in detail.  Her peripheral neuropathy is stable.  We will see her prior to her 10/26 appt to reassess her neuropathy and evaluate whether she will need a dose reduction of abraxane.       All questions were answered. The patient knows to call the clinic with any problems, questions or concerns. We can certainly see the patient much sooner if necessary.  Total encounter time:30 minutes*in face-to-face visit time, chart review, lab review, care coordination, order entry, and documentation of the encounter time.    Wilber Bihari, NP 09/02/22 1:36 PM Medical Oncology and Hematology [facility] [place], [place] [address] Tel. [phone]    Fax. [phone]  *Total Encounter Time as defined by the Centers for Medicare and Medicaid Services includes, in addition to the face-to-face time of a patient visit (documented in the note above) non-face-to-face time: obtaining and reviewing outside history, ordering and reviewing medications, tests or procedures, care coordination (communications with other health care professionals or caregivers) and documentation in the medical record.

## 2022-09-01 ENCOUNTER — Other Ambulatory Visit: Payer: Self-pay

## 2022-09-01 LAB — CANCER ANTIGEN 19-9: CA 19-9: 5728 U/mL — ABNORMAL HIGH (ref 0–35)

## 2022-09-02 ENCOUNTER — Encounter: Payer: Self-pay | Admitting: Hematology

## 2022-09-02 DIAGNOSIS — M25512 Pain in left shoulder: Secondary | ICD-10-CM | POA: Insufficient documentation

## 2022-09-02 NOTE — Assessment & Plan Note (Signed)
Miranda Francis is a 76 year old woman with pancreatic cancer on treatment with Gemcitabine and Abraxane.  She will proceed with day 1 cycle 4 of therapy.  I reviewed her labs with her in detail.  Her peripheral neuropathy is stable.  We will see her prior to her 10/26 appt to reassess her neuropathy and evaluate whether she will need a dose reduction of abraxane.

## 2022-09-02 NOTE — Assessment & Plan Note (Signed)
The etiology of her new onset left shoulder pain is unclear.  I have ordered an xray.  I also referred her to see Dr. Harl Bowie cardiology considering her diagnosis of diabetes, HTN, and FH of heart disease to rule out ischemic causes.  I reviewed the vague pain with Dr. Harl Bowie and she is helping me get the patient in on her schedule for evaluation.  I will f/u with patient on xray results and will place other orders as indicated at that time.

## 2022-09-03 ENCOUNTER — Encounter: Payer: Self-pay | Admitting: Hematology

## 2022-09-03 ENCOUNTER — Telehealth: Payer: Self-pay | Admitting: Adult Health

## 2022-09-03 NOTE — Telephone Encounter (Signed)
Scheduled appointment per 10/22 scheduling message. Patient is aware.

## 2022-09-04 ENCOUNTER — Telehealth: Payer: Self-pay

## 2022-09-04 ENCOUNTER — Other Ambulatory Visit: Payer: Self-pay

## 2022-09-04 NOTE — Telephone Encounter (Signed)
Pt was made aware of results and verbalized she is apprehensive about having an MRI as she feels it is not necessary. She has a freestyle CGM in her arm and would have to have it removed to have an MRI and she is not sure if that is something she is willing to do. Pt would like to think about it and have a nurse follow up with her tomorrow.

## 2022-09-04 NOTE — Telephone Encounter (Signed)
-----   Message from Gardenia Phlegm, NP sent at 09/02/2022  1:35 PM EDT ----- Please let patient know that her xray shows some inflammation around her rotator cuff.  I would recommend MRI for further evaluation.   ----- Message ----- From: Interface, Rad Results In Sent: 09/01/2022   2:17 PM EDT To: Gardenia Phlegm, NP

## 2022-09-05 ENCOUNTER — Telehealth: Payer: Self-pay

## 2022-09-05 MED FILL — Fosaprepitant Dimeglumine For IV Infusion 150 MG (Base Eq): INTRAVENOUS | Qty: 5 | Status: AC

## 2022-09-05 MED FILL — Dexamethasone Sodium Phosphate Inj 100 MG/10ML: INTRAMUSCULAR | Qty: 1 | Status: AC

## 2022-09-05 NOTE — Telephone Encounter (Signed)
Followed-up with patient regarding decision to have MRI done. Patient is open to having MRI done, but would like to call office back a week before she is to remove her FreeStyle CGM. Confirmed that patient had office number. Patient understands that it takes a number of business days to receive authorization and time to schedule MRI. Patient aware and will call office when she is a week out from changing sensor.

## 2022-09-06 ENCOUNTER — Inpatient Hospital Stay (HOSPITAL_BASED_OUTPATIENT_CLINIC_OR_DEPARTMENT_OTHER): Payer: Medicare HMO | Admitting: Adult Health

## 2022-09-06 ENCOUNTER — Inpatient Hospital Stay: Payer: Medicare HMO

## 2022-09-06 ENCOUNTER — Encounter: Payer: Self-pay | Admitting: Adult Health

## 2022-09-06 ENCOUNTER — Other Ambulatory Visit: Payer: Self-pay

## 2022-09-06 VITALS — BP 150/89 | HR 65 | Temp 98.2°F | Resp 18 | Wt 146.2 lb

## 2022-09-06 DIAGNOSIS — C250 Malignant neoplasm of head of pancreas: Secondary | ICD-10-CM

## 2022-09-06 DIAGNOSIS — Z5111 Encounter for antineoplastic chemotherapy: Secondary | ICD-10-CM | POA: Diagnosis not present

## 2022-09-06 DIAGNOSIS — Z95828 Presence of other vascular implants and grafts: Secondary | ICD-10-CM

## 2022-09-06 LAB — CBC WITH DIFFERENTIAL (CANCER CENTER ONLY)
Abs Immature Granulocytes: 0.02 10*3/uL (ref 0.00–0.07)
Basophils Absolute: 0 10*3/uL (ref 0.0–0.1)
Basophils Relative: 1 %
Eosinophils Absolute: 0.1 10*3/uL (ref 0.0–0.5)
Eosinophils Relative: 1 %
HCT: 26.6 % — ABNORMAL LOW (ref 36.0–46.0)
Hemoglobin: 9.1 g/dL — ABNORMAL LOW (ref 12.0–15.0)
Immature Granulocytes: 0 %
Lymphocytes Relative: 52 %
Lymphs Abs: 2.6 10*3/uL (ref 0.7–4.0)
MCH: 30.6 pg (ref 26.0–34.0)
MCHC: 34.2 g/dL (ref 30.0–36.0)
MCV: 89.6 fL (ref 80.0–100.0)
Monocytes Absolute: 0.4 10*3/uL (ref 0.1–1.0)
Monocytes Relative: 7 %
Neutro Abs: 2 10*3/uL (ref 1.7–7.7)
Neutrophils Relative %: 39 %
Platelet Count: 130 10*3/uL — ABNORMAL LOW (ref 150–400)
RBC: 2.97 MIL/uL — ABNORMAL LOW (ref 3.87–5.11)
RDW: 17.9 % — ABNORMAL HIGH (ref 11.5–15.5)
WBC Count: 5 10*3/uL (ref 4.0–10.5)
nRBC: 0 % (ref 0.0–0.2)

## 2022-09-06 LAB — CMP (CANCER CENTER ONLY)
ALT: 11 U/L (ref 0–44)
AST: 12 U/L — ABNORMAL LOW (ref 15–41)
Albumin: 3.3 g/dL — ABNORMAL LOW (ref 3.5–5.0)
Alkaline Phosphatase: 188 U/L — ABNORMAL HIGH (ref 38–126)
Anion gap: 3 — ABNORMAL LOW (ref 5–15)
BUN: 10 mg/dL (ref 8–23)
CO2: 27 mmol/L (ref 22–32)
Calcium: 8.8 mg/dL — ABNORMAL LOW (ref 8.9–10.3)
Chloride: 107 mmol/L (ref 98–111)
Creatinine: 0.52 mg/dL (ref 0.44–1.00)
GFR, Estimated: >60 mL/min (ref >60)
Glucose, Bld: 142 mg/dL — ABNORMAL HIGH (ref 70–99)
Potassium: 3.8 mmol/L (ref 3.5–5.1)
Sodium: 137 mmol/L (ref 135–145)
Total Bilirubin: 0.3 mg/dL (ref 0.3–1.2)
Total Protein: 6.3 g/dL — ABNORMAL LOW (ref 6.5–8.1)

## 2022-09-06 MED ORDER — SODIUM CHLORIDE 0.9 % IV SOLN
1000.0000 mg/m2 | Freq: Once | INTRAVENOUS | Status: AC
  Administered 2022-09-06: 1710 mg via INTRAVENOUS
  Filled 2022-09-06: qty 44.97

## 2022-09-06 MED ORDER — SODIUM CHLORIDE 0.9 % IV SOLN
10.0000 mg | Freq: Once | INTRAVENOUS | Status: AC
  Administered 2022-09-06: 10 mg via INTRAVENOUS
  Filled 2022-09-06: qty 10

## 2022-09-06 MED ORDER — SODIUM CHLORIDE 0.9% FLUSH
10.0000 mL | INTRAVENOUS | Status: DC | PRN
  Administered 2022-09-06: 10 mL

## 2022-09-06 MED ORDER — PALONOSETRON HCL INJECTION 0.25 MG/5ML
0.2500 mg | Freq: Once | INTRAVENOUS | Status: AC
  Administered 2022-09-06: 0.25 mg via INTRAVENOUS
  Filled 2022-09-06: qty 5

## 2022-09-06 MED ORDER — SODIUM CHLORIDE 0.9 % IV SOLN: Freq: Once | INTRAVENOUS | Status: AC

## 2022-09-06 MED ORDER — PROCHLORPERAZINE MALEATE 10 MG PO TABS
10.0000 mg | ORAL_TABLET | Freq: Once | ORAL | Status: AC
  Administered 2022-09-06: 10 mg via ORAL
  Filled 2022-09-06: qty 1

## 2022-09-06 MED ORDER — PACLITAXEL PROTEIN-BOUND CHEMO INJECTION 100 MG
80.0000 mg/m2 | Freq: Once | INTRAVENOUS | Status: AC
  Administered 2022-09-06: 125 mg via INTRAVENOUS
  Filled 2022-09-06: qty 25

## 2022-09-06 MED ORDER — SODIUM CHLORIDE 0.9 % IV SOLN
150.0000 mg | Freq: Once | INTRAVENOUS | Status: AC
  Administered 2022-09-06: 150 mg via INTRAVENOUS
  Filled 2022-09-06: qty 150

## 2022-09-06 MED ORDER — SODIUM CHLORIDE 0.9% FLUSH
10.0000 mL | Freq: Once | INTRAVENOUS | Status: AC
  Administered 2022-09-06: 10 mL

## 2022-09-06 MED ORDER — HEPARIN SOD (PORK) LOCK FLUSH 100 UNIT/ML IV SOLN
500.0000 [IU] | Freq: Once | INTRAVENOUS | Status: AC | PRN
  Administered 2022-09-06: 500 [IU]

## 2022-09-06 NOTE — Progress Notes (Signed)
[facility] Follow up:    Miranda Spanish, MD Miranda Francis [place] [id number]   DIAGNOSIS:  Cancer Staging  Pancreatic cancer [facility]) Staging form: Exocrine Pancreas, AJCC 8th Edition - Clinical stage from 01/18/2022: Stage IB (cT2, cN0, cM0) - Signed by Miranda Merle, MD on 01/28/2022 Stage prefix: Initial diagnosis Total positive nodes: 0   SUMMARY OF ONCOLOGIC HISTORY: Oncology History Overview Note   Cancer Staging  Pancreatic cancer [facility]) Staging form: Exocrine Pancreas, AJCC 8th Edition - Clinical stage from 01/18/2022: Stage IB (cT2, cN0, cM0) - Signed by Miranda Merle, MD on 01/28/2022    Pancreatic cancer ([place])  01/16/2022 Imaging   CLINICAL DATA:  Jaundice   EXAM: CT ABDOMEN AND PELVIS WITH CONTRAST  IMPRESSION: 1. Marked intra and extrahepatic biliary dilatation with marked dilatation of pancreatic duct and distended gallbladder. There is significant atrophy of the body and tail of pancreas. 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 2. Diverticular disease of the colon without acute wall thickening 3. Mild endometrial thickening up to 9 mm, recommend nonemergent pelvic ultrasound for further evaluation   01/17/2022 Imaging   EXAM: MRI ABDOMEN WITHOUT AND WITH CONTRAST (INCLUDING MRCP)  IMPRESSION: 1. Obstructing mass within the pancreatic head with an intraluminal component demonstrating low level enhancement. 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 2. This mass causes significant extrinsic narrowing at the portal/superior mesenteric vein confluence as well as marked intra and extrahepatic biliary dilatation. 3. No distant metastases  identified. 4. Endoscopy for endoscopic ultrasound, tissue sampling and biliary decompression recommended.   01/18/2022 Cancer Staging   Staging form: Exocrine Pancreas, AJCC 8th Edition - Clinical stage from 01/18/2022: Stage IB (cT2, cN0, cM0) - Signed by Miranda Merle, MD on 01/28/2022 Stage prefix: Initial diagnosis Total positive nodes: 0   01/18/2022 Initial Biopsy   FINAL MICROSCOPIC DIAGNOSIS:  A. PANCREAS, HEAD, FINE NEEDLE ASPIRATION:  - Malignant cells present consistent with adenocarcinoma   01/24/2022 Imaging   EXAM: CT CHEST WITHOUT CONTRAST  IMPRESSION: 1. No evidence of metastatic disease in the chest. 2. Atelectasis or consolidation in the right lower lung. 3. Healing right rib fractures. 4. Mass in the head of the pancreas. See previous report of CT abdomen and pelvis 01/16/2022. Interval placement of the biliary stent.   01/28/2022 Initial Diagnosis   Pancreatic cancer ([place])   02/16/2022 - 06/28/2022 Chemotherapy   Patient is on Treatment Plan : PANCREATIC Abraxane / Gemcitabine D1,8 q21d     02/16/2022 Genetic Testing   Negative genetic testing on the CancerNext-Expanded+RNAinsight panel.  The report date is February 16, 2022.  The CancerNext-Expanded gene panel offered by [facility] and includes sequencing and rearrangement analysis for the following 77 genes: AIP, ALK, APC*, ATM*, AXIN2, BAP1, BARD1, BLM, BMPR1A, BRCA1*, BRCA2*, BRIP1*, CDC73, CDH1*, CDK4, CDKN1B, CDKN2A, CHEK2*, CTNNA1, DICER1, FANCC, FH, FLCN, GALNT12, KIF1B, LZTR1, MAX, MEN1, MET, MLH1*, MSH2*, MSH3, MSH6*, MUTYH*, NBN, NF1*, NF2, NTHL1, PALB2*, PHOX2B, PMS2*, POT1, PRKAR1A, PTCH1, PTEN*, RAD51C*, RAD51D*, RB1, RECQL, RET, SDHA, SDHAF2, SDHB, SDHC, SDHD, SMAD4, SMARCA4, SMARCB1, SMARCE1, STK11, SUFU, TMEM127, TP53*, TSC1, TSC2, VHL and XRCC2 (sequencing and deletion/duplication); EGFR, EGLN1, HOXB13, KIT, MITF, PDGFRA, POLD1, and POLE (sequencing only); EPCAM and GREM1 (deletion/duplication only). DNA and  RNA  analyses performed for * genes.    06/14/2022 -  Chemotherapy   Patient is on Treatment Plan : PANCREATIC Abraxane D1,8,15 + Gemcitabine D1,8,15 q28d       CURRENT THERAPY: Abraxane/Gemzar  INTERVAL HISTORY: Miranda Francis 76 y.o. female returns for follow-up prior to receiving day 8 of her Abraxane and Gemzar.  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  She has her compression socks on and is hoping that this will help ameliorate some of her symptoms.  We were following up on her left shoulder pain that she has been experiencing and the fact that her x-ray showed some inflammation of the rotator cuff that we were considering doing an MRI on.  However, her CA 19-9 shows a level of 5728 up from 2976 a few weeks prior.  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   Port-A-Cath in place 03/14/2022   Genetic testing 02/16/2022   Family history of colon cancer 02/07/2022   Family history of breast cancer 02/07/2022   Family history of prostate cancer 02/07/2022   Pancreatic cancer ([place]) 01/28/2022   Normocytic anemia 01/22/2022   Post-ERCP acute pancreatitis 01/19/2022   Malnutrition of moderate degree 01/18/2022   Pancreatic mass 01/17/2022   Elevated liver enzymes 01/17/2022   Hypertension, benign essential, goal below 140/90 09/28/2015   Hypokalemia 11/01/2011   Weakness generalized 11/01/2011   Serum lipase elevation 11/01/2011   Nausea & vomiting 11/01/2011   Diarrhea 11/01/2011   Endometritis 11/01/2011   DM (diabetes mellitus) ([place]) 11/01/2011   HTN (hypertension) 11/01/2011   Hyperlipidemia 11/01/2011   Obesity 11/01/2011    has No Known Allergies.  MEDICAL HISTORY: Past Medical History:  Diagnosis Date   Blood transfusion    Diabetes mellitus    Family history of breast cancer    Family history of colon cancer    Family history of prostate cancer     Heart murmur    Hypertension     SURGICAL HISTORY: Past Surgical History:  Procedure Laterality Date   BILIARY STENT PLACEMENT  01/18/2022   Procedure: BILIARY STENT PLACEMENT;  Surgeon: Carol Ada, MD;  Location: [facility];  Service: Gastroenterology;;   DILATION AND CURETTAGE OF UTERUS     ERCP N/A 01/18/2022   Procedure: ENDOSCOPIC RETROGRADE CHOLANGIOPANCREATOGRAPHY (ERCP);  Surgeon: Carol Ada, MD;  Location: [facility];  Service: Gastroenterology;  Laterality: N/A;   ESOPHAGOGASTRODUODENOSCOPY N/A 01/18/2022   Procedure: ESOPHAGOGASTRODUODENOSCOPY (EGD);  Surgeon: Carol Ada, MD;  Location: [facility];  Service: Gastroenterology;  Laterality: N/A;   EUS  01/18/2022   Procedure: UPPER ENDOSCOPIC ULTRASOUND (EUS) LINEAR;  Surgeon: Carol Ada, MD;  Location: [facility];  Service: Gastroenterology;;   FINE NEEDLE ASPIRATION  01/18/2022   Procedure: FINE NEEDLE ASPIRATION (FNA) LINEAR;  Surgeon: Carol Ada, MD;  Location: [facility];  Service: Gastroenterology;;   IR IMAGING GUIDED PORT INSERTION  02/15/2022   NO PAST SURGERIES     SPHINCTEROTOMY  01/18/2022   Procedure: Joan Mayans;  Surgeon: Carol Ada, MD;  Location: [facility] ENDOSCOPY;  Service: Gastroenterology;;    SOCIAL HISTORY: Social History   Socioeconomic History   Marital status: Married    Spouse name: Not on file   Number of children: 2   Years of education: Not on file   Highest education level: Not on file  Occupational History   Not on file  Tobacco Use   Smoking  status: Never   Smokeless tobacco: Never  Substance and Sexual Activity   Alcohol use: Never   Drug use: Never   Sexual activity: Not on file  Other Topics Concern   Not on file  Social History Narrative   Not on file   Social Determinants of Health   Financial Resource Strain: Not on file  Food Insecurity: Not on file  Transportation Needs: Not on file  Physical Activity: Not on file  Stress: Not on file  Social  Connections: Not on file  Intimate Partner Violence: Not on file    FAMILY HISTORY: Family History  Problem Relation Age of Onset   Diabetes type II Mother    Stroke Father    Diabetes type II Sister    Colon cancer Sister 54   Colon cancer Brother 80   Breast cancer Maternal Aunt 31   Breast cancer Maternal Aunt 94   Prostate cancer Maternal Uncle    Heart attack Maternal Grandmother     Review of Systems  Constitutional:  Positive for fatigue. Negative for appetite change, chills, fever and unexpected weight change.  HENT:   Negative for hearing loss, lump/mass and trouble swallowing.   Eyes:  Negative for eye problems and icterus.  Respiratory:  Negative for chest tightness, cough and shortness of breath.   Cardiovascular:  Negative for chest pain, leg swelling and palpitations.  Gastrointestinal:  Negative for abdominal distention, abdominal pain, constipation, diarrhea, nausea and vomiting.  Endocrine: Negative for hot flashes.  Genitourinary:  Negative for difficulty urinating.   Musculoskeletal:  Negative for arthralgias.  Skin:  Negative for itching and rash.  Neurological:  Negative for dizziness, extremity weakness, headaches and numbness.  Hematological:  Negative for adenopathy. Does not bruise/bleed easily.  Psychiatric/Behavioral:  Negative for depression. The patient is not nervous/anxious.       PHYSICAL EXAMINATION  ECOG PERFORMANCE STATUS: 1 - Symptomatic but completely ambulatory Please see [facility] for vitals that were completed in the infusion room  Physical Exam Constitutional:      General: She is not in acute distress.    Appearance: Normal appearance. She is not toxic-appearing.  HENT:     Head: Normocephalic and atraumatic.  Eyes:     General: No scleral icterus. Cardiovascular:     Rate and Rhythm: Normal rate and regular rhythm.     Pulses: Normal pulses.     Heart sounds: Normal heart sounds.  Pulmonary:     Effort: Pulmonary effort is  normal.     Breath sounds: Normal breath sounds.  Abdominal:     General: Abdomen is flat. Bowel sounds are normal. There is no distension.     Palpations: Abdomen is soft.     Tenderness: There is no abdominal tenderness.  Musculoskeletal:        General: No swelling.     Cervical back: Neck supple.  Lymphadenopathy:     Cervical: No cervical adenopathy.  Skin:    General: Skin is warm and dry.     Findings: No rash.  Neurological:     General: No focal deficit present.     Mental Status: She is alert.  Psychiatric:        Mood and Affect: Mood normal.        Behavior: Behavior normal.     LABORATORY DATA:  CBC    Component Value Date/Time   WBC 5.0 09/06/2022 1328   WBC 6.9 08/02/2022 1014   RBC 2.97 (L) 09/06/2022 1328  HGB 9.1 (L) 09/06/2022 1328   HCT 26.6 (L) 09/06/2022 1328   PLT 130 (L) 09/06/2022 1328   MCV 89.6 09/06/2022 1328   MCH 30.6 09/06/2022 1328   MCHC 34.2 09/06/2022 1328   RDW 17.9 (H) 09/06/2022 1328   LYMPHSABS 2.6 09/06/2022 1328   MONOABS 0.4 09/06/2022 1328   EOSABS 0.1 09/06/2022 1328   BASOSABS 0.0 09/06/2022 1328    CMP     Component Value Date/Time   NA 137 09/06/2022 1328   K 3.8 09/06/2022 1328   CL 107 09/06/2022 1328   CO2 27 09/06/2022 1328   GLUCOSE 142 (H) 09/06/2022 1328   BUN 10 09/06/2022 1328   CREATININE 0.52 09/06/2022 1328   CALCIUM 8.8 (L) 09/06/2022 1328   PROT 6.3 (L) 09/06/2022 1328   ALBUMIN 3.3 (L) 09/06/2022 1328   AST 12 (L) 09/06/2022 1328   ALT 11 09/06/2022 1328   ALKPHOS 188 (H) 09/06/2022 1328   BILITOT 0.3 09/06/2022 1328   GFRNONAA >60 09/06/2022 1328   GFRAA >60 10/31/2018 0937      ASSESSMENT and THERAPY PLAN:   Pancreatic cancer ([place]) Miranda Francis is a 76 year old woman with pancreatic cancer on treatment with Gemcitabine and Abraxane.  She will proceed with cycle 4-day 8 of therapy.  Her CA 19-9 has increased to the high 5000 range therefore I have ordered CT chest abdomen and pelvis after  discussing this with Dr. Morey Hummingbird.  I would like to hold off on an MRI on her shoulder until we understand better what is going on in her body with her pancreatic cancer which is perfectly reasonable.  She will however continue her follow-up and evaluation with Dr. Harl Bowie to ensure there is nothing cardiac related given her risk.    All questions were answered. The patient knows to call the clinic with any problems, questions or concerns. We can certainly see the patient much sooner if necessary.  Total encounter time:30 minutes*in face-to-face visit time, chart review, lab review, care coordination, order entry, and documentation of the encounter time.    Wilber Bihari, NP 09/10/22 3:02 PM Medical Oncology and Hematology [facility] [place], [place] [address] Tel. [phone]    Fax. [phone]  *Total Encounter Time as defined by the Centers for Medicare and Medicaid Services includes, in addition to the face-to-face time of a patient visit (documented in the note above) non-face-to-face time: obtaining and reviewing outside history, ordering and reviewing medications, tests or procedures, care coordination (communications with other health care professionals or caregivers) and documentation in the medical record.

## 2022-09-06 NOTE — Patient Instructions (Signed)
[facility]   Discharge Instructions: Thank you for choosing [facility] to provide your oncology and hematology care.   If you have a lab appointment with the [facility], please go directly to the [facility] and check in at the registration area.   Wear comfortable clothing and clothing appropriate for easy access to any Portacath or PICC line.   We strive to give you quality time with your provider. You may need to reschedule your appointment if you arrive late (15 or more minutes).  Arriving late affects you and other patients whose appointments are after yours.  Also, if you miss three or more appointments without notifying the office, you may be dismissed from the clinic at the provider's discretion.      For prescription refill requests, have your pharmacy contact our office and allow 72 hours for refills to be completed.    Today you received the following chemotherapy and/or immunotherapy agents: Paclitaxel-protein bound (Abraxane) and Gemcitabine (Gemzar)     To help prevent nausea and vomiting after your treatment, we encourage you to take your nausea medication as directed.  BELOW ARE SYMPTOMS THAT SHOULD BE REPORTED IMMEDIATELY: *FEVER GREATER THAN 100.4 F (38 C) OR HIGHER *CHILLS OR SWEATING *NAUSEA AND VOMITING THAT IS NOT CONTROLLED WITH YOUR NAUSEA MEDICATION *UNUSUAL SHORTNESS OF BREATH *UNUSUAL BRUISING OR BLEEDING *URINARY PROBLEMS (pain or burning when urinating, or frequent urination) *BOWEL PROBLEMS (unusual diarrhea, constipation, pain near the anus) TENDERNESS IN MOUTH AND THROAT WITH OR WITHOUT PRESENCE OF ULCERS (sore throat, sores in mouth, or a toothache) UNUSUAL RASH, SWELLING OR PAIN  UNUSUAL VAGINAL DISCHARGE OR ITCHING   Items with * indicate a potential emergency and should be followed up as soon as possible or go to the Emergency Department if any problems should occur.  Please show the CHEMOTHERAPY  ALERT CARD or IMMUNOTHERAPY ALERT CARD at check-in to the Emergency Department and triage nurse.  Should you have questions after your visit or need to cancel or reschedule your appointment, please contact [facility]  Dept: [phone]  and follow the prompts.  Office hours are 8:00 a.m. to 4:30 p.m. Monday - Friday. Please note that voicemails left after 4:00 p.m. may not be returned until the following business day.  We are closed weekends and major holidays. You have access to a nurse at all times for urgent questions. Please call the main number to the clinic Dept: [phone] and follow the prompts.   For any non-urgent questions, you may also contact your provider using MyChart. We now offer e-Visits for anyone 75 and older to request care online for non-urgent symptoms. For details visit [email or web address].   Also download the MyChart app! Go to the app store, search "MyChart", open the app, select [facility], and log in with your MyChart username and password.  Masks are optional in the cancer centers. If you would like for your care team to wear a mask while they are taking care of you, please let them know. You may have one support person who is at least 76 years old accompany you for your appointments.

## 2022-09-10 ENCOUNTER — Encounter: Payer: Self-pay | Admitting: Hematology

## 2022-09-10 ENCOUNTER — Telehealth: Payer: Self-pay | Admitting: Adult Health

## 2022-09-10 NOTE — Telephone Encounter (Signed)
Rescheduled appointments per 10/26 los. Left voicemail.

## 2022-09-10 NOTE — Assessment & Plan Note (Signed)
Miranda Francis is a 76 year old woman with pancreatic cancer on treatment with Gemcitabine and Abraxane.  She will proceed with cycle 4-day 8 of therapy.  Her CA 19-9 has increased to the high 5000 range therefore I have ordered CT chest abdomen and pelvis after discussing this with Dr. Morey Hummingbird.  I would like to hold off on an MRI on her shoulder until we understand better what is going on in her body with her pancreatic cancer which is perfectly reasonable.  She will however continue her follow-up and evaluation with Dr. Harl Bowie to ensure there is nothing cardiac related given her risk.

## 2022-09-10 NOTE — Telephone Encounter (Signed)
Rescheduled appointments per 10/26 los. Patient is aware of the changes made to her upcoming appointments.

## 2022-09-11 ENCOUNTER — Other Ambulatory Visit: Payer: Self-pay

## 2022-09-11 NOTE — Progress Notes (Unsigned)
Cardiology Office Note:    Date:  09/12/2022   ID:  Miranda Francis, DOB 11-May-1946, MRN [id number]  PCP:  [name], MD   [place] Providers Cardiologist:  [name], MD     Referring MD: [name], MD   No chief complaint on file. Arm pain  History of Present Illness:    Miranda Francis is a 76 y.o. female with a hx of HTN, pancreatic adenoma stage IB , evaluate cardiac risk while undergoing cancer therapy. No cardiac dx. She notes heart murmur when she was younger. She has hypertension. She had L upper arm pain, localized. Has some paraesthesias. No chest pressure or DOE.  No smoking. No etoh. No palpitations. No syncope. No orthopnea/PND/ LE edema BL. Non smoker  Blood pressure at home 127/70s today at home.  Family Hx: none  CVD risk :  Cancer Therapy: - Abraxane/Gemcitabine 02/16/2022-ongoing; Neoadjuvant chemo - Being considered for a Whipple procedure at [facility]  Past Medical History:  Diagnosis Date   Blood transfusion    Diabetes mellitus    Family history of breast cancer    Family history of colon cancer    Family history of prostate cancer    Heart murmur    Hypertension     Past Surgical History:  Procedure Laterality Date   BILIARY STENT PLACEMENT  01/18/2022   Procedure: BILIARY STENT PLACEMENT;  Surgeon: Carol Ada, MD;  Location: [place];  Service: Gastroenterology;;   DILATION AND CURETTAGE OF UTERUS     ERCP N/A 01/18/2022   Procedure: ENDOSCOPIC RETROGRADE CHOLANGIOPANCREATOGRAPHY (ERCP);  Surgeon: Carol Ada, MD;  Location: [place];  Service: Gastroenterology;  Laterality: N/A;   ESOPHAGOGASTRODUODENOSCOPY N/A 01/18/2022   Procedure: ESOPHAGOGASTRODUODENOSCOPY (EGD);  Surgeon: Carol Ada, MD;  Location: [place];  Service: Gastroenterology;  Laterality: N/A;   EUS  01/18/2022   Procedure: UPPER ENDOSCOPIC ULTRASOUND (EUS) LINEAR;  Surgeon: Carol Ada, MD;  Location: [facility] ENDOSCOPY;  Service:  Gastroenterology;;   FINE NEEDLE ASPIRATION  01/18/2022   Procedure: FINE NEEDLE ASPIRATION (FNA) LINEAR;  Surgeon: Carol Ada, MD;  Location: [place];  Service: Gastroenterology;;   IR IMAGING GUIDED PORT INSERTION  02/15/2022   NO PAST SURGERIES     SPHINCTEROTOMY  01/18/2022   Procedure: Joan Mayans;  Surgeon: Carol Ada, MD;  Location: [facility] ENDOSCOPY;  Service: Gastroenterology;;    Current Medications: Current Meds  Medication Sig   acetaminophen (TYLENOL) 500 MG tablet Take 1,000 mg by mouth every 6 (six) hours as needed for moderate pain or headache.   amLODipine (NORVASC) 5 MG tablet Take 5 mg by mouth daily.   Cholecalciferol (QC VITAMIN D3) 50 MCG (2000 UT) TABS Take 2,000 Units by mouth daily.   Continuous Blood Gluc Sensor (FREESTYLE LIBRE 2 SENSOR) MISC USE 1 SENSOR AND CHANGE EVERY 14 DAYS AS DIRECTED.   gabapentin (NEURONTIN) 100 MG capsule Take 2-3 capsules (200-300 mg total) by mouth at bedtime.   KLOR-CON M20 20 MEQ tablet Take 20 mEq by mouth 2 (two) times daily.   lidocaine-prilocaine (EMLA) cream Apply 1 application topically as needed.   losartan (COZAAR) 100 MG tablet Take 100 mg by mouth daily.   NOVOLOG FLEXPEN 100 UNIT/ML FlexPen Inject 15 Units into the skin 2 (two) times daily.   oxyCODONE (OXY IR/ROXICODONE) 5 MG immediate release tablet Take 1 tablet (5 mg total) by mouth every 3 (three) hours as needed for severe pain.   polyethylene glycol (MIRALAX / GLYCOLAX) 17 g packet Take  17 g by mouth daily.   zolpidem (AMBIEN) 10 MG tablet Take 10 mg by mouth at bedtime as needed.     Allergies:   Patient has no known allergies.   Social History   Socioeconomic History   Marital status: Married    Spouse name: Not on file   Number of children: 2   Years of education: Not on file   Highest education level: Not on file  Occupational History   Not on file  Tobacco Use   Smoking status: Never   Smokeless tobacco: Never  Substance and Sexual Activity    Alcohol use: Never   Drug use: Never   Sexual activity: Not on file  Other Topics Concern   Not on file  Social History Narrative   Not on file   Social Determinants of Health   Financial Resource Strain: Not on file  Food Insecurity: Not on file  Transportation Needs: Not on file  Physical Activity: Not on file  Stress: Not on file  Social Connections: Not on file     Family History: The patient's family history includes Breast cancer (age of onset: 16) in her maternal aunt; Breast cancer (age of onset: 38) in her maternal aunt; Colon cancer (age of onset: 27) in her brother; Colon cancer (age of onset: 74) in her sister; Diabetes type II in her mother and sister; Heart attack in her maternal grandmother; Prostate cancer in her maternal uncle; Stroke in her father.  ROS:   Please see the history of present illness.     All other systems reviewed and are negative.  EKGs/Labs/Other Studies Reviewed:    The following studies were reviewed today:   EKG:  EKG is  ordered today.  The ekg ordered today demonstrates   Ectopic  focus  Recent Labs: 01/22/2022: Magnesium 1.9 09/06/2022: ALT 11; BUN 10; Creatinine 0.52; Hemoglobin 9.1; Platelet Count 130; Potassium 3.8; Sodium 137  Recent Lipid Panel    Component Value Date/Time   CHOL 138 11/02/2011 0520   TRIG 93 11/02/2011 0520   HDL 37 (L) 11/02/2011 0520   CHOLHDL 3.7 11/02/2011 0520   VLDL 19 11/02/2011 0520   LDLCALC 82 11/02/2011 0520     Risk Assessment/Calculations:     Physical Exam:    VS:   Vitals:   09/12/22 1420  BP: (!) 150/88  Pulse: 65  SpO2: 96%    Wt Readings from Last 3 Encounters:  09/12/22 146 lb (66.2 kg)  09/06/22 146 lb 4 oz (66.3 kg)  08/30/22 149 lb (67.6 kg)     GEN:  Well nourished, well developed in no acute distress HEENT: Normal NECK: No JVD; No carotid bruits LYMPHATICS: No lymphadenopathy CARDIAC: RRR, no murmurs, rubs, gallops RESPIRATORY:  Clear to auscultation without  rales, wheezing or rhonchi  ABDOMEN: Soft, non-tender, non-distended MUSCULOSKELETAL:  No edema; No deformity  SKIN: Warm and dry NEUROLOGIC:  Alert and oriented x 3 PSYCHIATRIC:  Normal affect   ASSESSMENT:   Arm Pain: localized. Has full range of motion of the L shoulder. May be muscle related. Otherwise, no associated CP or SOB; low concern for cardiac cause  HTN: notes well controlled at home.  Goal <130/80 mmHg. Discussed if increases at home, to let us know, can help uptitrate her BP meds. Can be labile with chemo. Will follow  Cardio Onc - not on significant cardiotoxic chemotherapy - CVD risk includes age PLAN:    In order of problems listed above:  Follow  up in 6 months       Medication Adjustments/Labs and Tests Ordered: Current medicines are reviewed at length with the patient today.  Concerns regarding medicines are outlined above.  Orders Placed This Encounter  Procedures   EKG 12-Lead   No orders of the defined types were placed in this encounter.   Patient Instructions  Medication Instructions:  No Changes In Medications at this time.  *If you need a refill on your cardiac medications before your next appointment, please call your pharmacy*  Lab Work: None Ordered At This Time.  If you have labs (blood work) drawn today and your tests are completely normal, you will receive your results only by: [place] (if you have MyChart) OR A paper copy in the mail If you have any lab test that is abnormal or we need to change your treatment, we will call you to review the results.  Testing/Procedures: None Ordered At This Time.   Follow-Up: At [facility], you and your health needs are our priority.  As part of our continuing mission to provide you with exceptional heart care, we have created designated Provider Care Teams.  These Care Teams include your primary Cardiologist (physician) and Advanced Practice Providers (APPs -  Physician Assistants  and Nurse Practitioners) who all work together to provide you with the care you need, when you need it.  We recommend signing up for the patient portal called "MyChart".  Sign up information is provided on this After Visit Summary.  MyChart is used to connect with patients for Virtual Visits (Telemedicine).  Patients are able to view lab/test results, encounter notes, upcoming appointments, etc.  Non-urgent messages can be sent to your provider as well.   To learn more about what you can do with MyChart, go to [email or web address].    Your next appointment:   6 month(s)  The format for your next appointment:   In Person  Provider:   [name], MD           Signed, [name], MD  09/12/2022 3:27 PM    [place]

## 2022-09-12 ENCOUNTER — Encounter: Payer: Self-pay | Admitting: Internal Medicine

## 2022-09-12 ENCOUNTER — Ambulatory Visit: Payer: Medicare HMO | Attending: Internal Medicine | Admitting: Internal Medicine

## 2022-09-12 ENCOUNTER — Other Ambulatory Visit (HOSPITAL_BASED_OUTPATIENT_CLINIC_OR_DEPARTMENT_OTHER): Payer: Self-pay

## 2022-09-12 ENCOUNTER — Other Ambulatory Visit: Payer: Self-pay | Admitting: Nurse Practitioner

## 2022-09-12 VITALS — BP 150/88 | HR 65 | Ht 63.0 in | Wt 146.0 lb

## 2022-09-12 DIAGNOSIS — I10 Essential (primary) hypertension: Secondary | ICD-10-CM | POA: Diagnosis not present

## 2022-09-12 MED ORDER — GABAPENTIN 100 MG PO CAPS
200.0000 mg | ORAL_CAPSULE | Freq: Every day | ORAL | 0 refills | Status: DC
  Filled 2022-09-12: qty 90, 30d supply, fill #0

## 2022-09-12 NOTE — Patient Instructions (Signed)
Medication Instructions:  No Changes In Medications at this time.  *If you need a refill on your cardiac medications before your next appointment, please call your pharmacy*  Lab Work: None Ordered At This Time.  If you have labs (blood work) drawn today and your tests are completely normal, you will receive your results only by: [place] (if you have MyChart) OR A paper copy in the mail If you have any lab test that is abnormal or we need to change your treatment, we will call you to review the results.  Testing/Procedures: None Ordered At This Time.   Follow-Up: At [facility], you and your health needs are our priority.  As part of our continuing mission to provide you with exceptional heart care, we have created designated Provider Care Teams.  These Care Teams include your primary Cardiologist (physician) and Advanced Practice Providers (APPs -  Physician Assistants and Nurse Practitioners) who all work together to provide you with the care you need, when you need it.  We recommend signing up for the patient portal called "MyChart".  Sign up information is provided on this After Visit Summary.  MyChart is used to connect with patients for Virtual Visits (Telemedicine).  Patients are able to view lab/test results, encounter notes, upcoming appointments, etc.  Non-urgent messages can be sent to your provider as well.   To learn more about what you can do with MyChart, go to [email or web address].    Your next appointment:   6 month(s)  The format for your next appointment:   In Person  Provider:   [name], MD

## 2022-09-18 ENCOUNTER — Other Ambulatory Visit: Payer: Self-pay

## 2022-09-18 MED FILL — Fosaprepitant Dimeglumine For IV Infusion 150 MG (Base Eq): INTRAVENOUS | Qty: 5 | Status: AC

## 2022-09-18 MED FILL — Dexamethasone Sodium Phosphate Inj 100 MG/10ML: INTRAMUSCULAR | Qty: 1 | Status: AC

## 2022-09-19 ENCOUNTER — Inpatient Hospital Stay: Payer: Medicare HMO | Attending: Physician Assistant

## 2022-09-19 ENCOUNTER — Other Ambulatory Visit: Payer: Self-pay

## 2022-09-19 ENCOUNTER — Encounter: Payer: Self-pay | Admitting: Hematology

## 2022-09-19 ENCOUNTER — Inpatient Hospital Stay: Payer: Medicare HMO

## 2022-09-19 ENCOUNTER — Telehealth: Payer: Self-pay | Admitting: Hematology

## 2022-09-19 ENCOUNTER — Inpatient Hospital Stay (HOSPITAL_BASED_OUTPATIENT_CLINIC_OR_DEPARTMENT_OTHER): Payer: Medicare HMO | Admitting: Hematology

## 2022-09-19 VITALS — BP 133/73 | HR 63 | Temp 97.8°F | Resp 18

## 2022-09-19 VITALS — BP 143/86 | HR 74 | Temp 99.6°F | Resp 16 | Wt 146.5 lb

## 2022-09-19 DIAGNOSIS — G620 Drug-induced polyneuropathy: Secondary | ICD-10-CM | POA: Insufficient documentation

## 2022-09-19 DIAGNOSIS — C250 Malignant neoplasm of head of pancreas: Secondary | ICD-10-CM

## 2022-09-19 DIAGNOSIS — D649 Anemia, unspecified: Secondary | ICD-10-CM | POA: Insufficient documentation

## 2022-09-19 DIAGNOSIS — C787 Secondary malignant neoplasm of liver and intrahepatic bile duct: Secondary | ICD-10-CM | POA: Insufficient documentation

## 2022-09-19 DIAGNOSIS — Z5111 Encounter for antineoplastic chemotherapy: Secondary | ICD-10-CM | POA: Insufficient documentation

## 2022-09-19 DIAGNOSIS — Z95828 Presence of other vascular implants and grafts: Secondary | ICD-10-CM

## 2022-09-19 DIAGNOSIS — Z79899 Other long term (current) drug therapy: Secondary | ICD-10-CM | POA: Insufficient documentation

## 2022-09-19 LAB — CBC WITH DIFFERENTIAL (CANCER CENTER ONLY)
Abs Immature Granulocytes: 0.02 10*3/uL (ref 0.00–0.07)
Basophils Absolute: 0 10*3/uL (ref 0.0–0.1)
Basophils Relative: 1 %
Eosinophils Absolute: 0.1 10*3/uL (ref 0.0–0.5)
Eosinophils Relative: 2 %
HCT: 26 % — ABNORMAL LOW (ref 36.0–46.0)
Hemoglobin: 8.6 g/dL — ABNORMAL LOW (ref 12.0–15.0)
Immature Granulocytes: 0 %
Lymphocytes Relative: 38 %
Lymphs Abs: 2 10*3/uL (ref 0.7–4.0)
MCH: 29.6 pg (ref 26.0–34.0)
MCHC: 33.1 g/dL (ref 30.0–36.0)
MCV: 89.3 fL (ref 80.0–100.0)
Monocytes Absolute: 0.4 10*3/uL (ref 0.1–1.0)
Monocytes Relative: 7 %
Neutro Abs: 2.8 10*3/uL (ref 1.7–7.7)
Neutrophils Relative %: 52 %
Platelet Count: 262 10*3/uL (ref 150–400)
RBC: 2.91 MIL/uL — ABNORMAL LOW (ref 3.87–5.11)
RDW: 18.1 % — ABNORMAL HIGH (ref 11.5–15.5)
WBC Count: 5.3 10*3/uL (ref 4.0–10.5)
nRBC: 0 % (ref 0.0–0.2)

## 2022-09-19 LAB — CMP (CANCER CENTER ONLY)
ALT: 12 U/L (ref 0–44)
AST: 15 U/L (ref 15–41)
Albumin: 3.3 g/dL — ABNORMAL LOW (ref 3.5–5.0)
Alkaline Phosphatase: 192 U/L — ABNORMAL HIGH (ref 38–126)
Anion gap: 5 (ref 5–15)
BUN: 11 mg/dL (ref 8–23)
CO2: 27 mmol/L (ref 22–32)
Calcium: 9 mg/dL (ref 8.9–10.3)
Chloride: 109 mmol/L (ref 98–111)
Creatinine: 0.51 mg/dL (ref 0.44–1.00)
GFR, Estimated: >60 mL/min (ref >60)
Glucose, Bld: 127 mg/dL — ABNORMAL HIGH (ref 70–99)
Potassium: 3.8 mmol/L (ref 3.5–5.1)
Sodium: 141 mmol/L (ref 135–145)
Total Bilirubin: 0.3 mg/dL (ref 0.3–1.2)
Total Protein: 6.5 g/dL (ref 6.5–8.1)

## 2022-09-19 MED ORDER — HEPARIN SOD (PORK) LOCK FLUSH 100 UNIT/ML IV SOLN
500.0000 [IU] | Freq: Once | INTRAVENOUS | Status: AC | PRN
  Administered 2022-09-19: 500 [IU]

## 2022-09-19 MED ORDER — SODIUM CHLORIDE 0.9% FLUSH
10.0000 mL | INTRAVENOUS | Status: DC | PRN
  Administered 2022-09-19: 10 mL

## 2022-09-19 MED ORDER — PALONOSETRON HCL INJECTION 0.25 MG/5ML
0.2500 mg | Freq: Once | INTRAVENOUS | Status: AC
  Administered 2022-09-19: 0.25 mg via INTRAVENOUS
  Filled 2022-09-19: qty 5

## 2022-09-19 MED ORDER — SODIUM CHLORIDE 0.9 % IV SOLN
1000.0000 mg/m2 | Freq: Once | INTRAVENOUS | Status: AC
  Administered 2022-09-19: 1710 mg via INTRAVENOUS
  Filled 2022-09-19: qty 44.97

## 2022-09-19 MED ORDER — SODIUM CHLORIDE 0.9 % IV SOLN: Freq: Once | INTRAVENOUS | Status: DC

## 2022-09-19 MED ORDER — PACLITAXEL PROTEIN-BOUND CHEMO INJECTION 100 MG
80.0000 mg/m2 | Freq: Once | INTRAVENOUS | Status: AC
  Administered 2022-09-19: 125 mg via INTRAVENOUS
  Filled 2022-09-19: qty 25

## 2022-09-19 MED ORDER — SODIUM CHLORIDE 0.9 % IV SOLN
150.0000 mg | Freq: Once | INTRAVENOUS | Status: AC
  Administered 2022-09-19: 150 mg via INTRAVENOUS
  Filled 2022-09-19: qty 150

## 2022-09-19 MED ORDER — SODIUM CHLORIDE 0.9 % IV SOLN
10.0000 mg | Freq: Once | INTRAVENOUS | Status: AC
  Administered 2022-09-19: 10 mg via INTRAVENOUS
  Filled 2022-09-19: qty 10

## 2022-09-19 MED ORDER — SODIUM CHLORIDE 0.9 % IV SOLN: Freq: Once | INTRAVENOUS | Status: AC

## 2022-09-19 MED ORDER — PROCHLORPERAZINE MALEATE 10 MG PO TABS
10.0000 mg | ORAL_TABLET | Freq: Once | ORAL | Status: AC
  Administered 2022-09-19: 10 mg via ORAL
  Filled 2022-09-19: qty 1

## 2022-09-19 MED ORDER — SODIUM CHLORIDE 0.9% FLUSH
10.0000 mL | Freq: Once | INTRAVENOUS | Status: AC
  Administered 2022-09-19: 10 mL

## 2022-09-19 NOTE — Progress Notes (Signed)
[place]   [phone] [phone]   Clinic Follow up Note   Patient Care Team: [name], MD as PCP - General (Internal Medicine) [name], MD as PCP - Cardiology (Cardiology) Truitt Merle, MD as Consulting Physician (Oncology)  Date of Service:  09/19/2022  CHIEF COMPLAINT: f/u of pancreatic cancer  CURRENT THERAPY:  Neoadjuvant Gemcitabine, days 1 and 8 q21d, starting 02/16/22 -Abraxane added with C2 (03/08/22)  ASSESSMENT & PLAN:  Miranda Francis is a 76 y.o. female with   1. Pancreatic Cancer, stage IB c(T2, N0, Mx) with indeterminate liver lesions, borderline resectable, MMR normal   -presented to PCP with jaundice for one month. Also complained of epigastric pain, poor appetite, weight loss, and light-colored stool. Found to have elevated liver enzymes and referred to ED on 01/16/22. CMP in ED showed tbili at 39.3. CT AP showed: marked biliary dilatation; soft tissue mass at pancreatic head with marked narrowing of SMV; diverticular disease of colon.  No distant metastasis on CT scan. -baseline CA 19-9 on 01/16/22 was elevated at 9,561. -MRCP abdomen MRI on 01/17/22 showed: obstructing mass within pancreatic head with low-level enhancement, causing significant extrinsic narrowing at SMV and dilatation. -ERCP on 01/18/22 by Dr. Benson Norway, FNA of pancreas head mass showed malignant cells consistent with adenocarcinoma. MMR normal. -she began neoadjuvant gemcitabine alone on 02/16/22, abraxane was added with C2 (03/08/22).   -abdomen MRI on 06/19/22 showed: subtle liver lesions are stable from MRI in 01/2022 (not reported then); no change in pancreatic mass.  -Dr. Zenia Resides feels she is not a good surgical candidate due to he indeterminate liver lesions and high CA19.9. -her CA 19-9 dropped significantly after starting chemo treatment and then stabilized. This jumped on 08/30/22 back to [place]. Today's result is pending. -she is scheduled for restaging CT on  09/24/22. I explained that if her tumor marker is further elevated and/or her scan shows progression, I will recommend changing treatment, likely to FOLFOX or FOLFIRI. -she is tolerating treatment well overall. Labs reviewed, overall stable. Adequate to continue treatment.   2. Neuropathy secondary to chemo, grade 1 -She has developed numbness on her toes, no impact on her function, no neuropathy in her hands. -We reduced her Abraxane dose, will monitor closely.   3. Anemia -hgb dropped to 6.4 by 01/25/22 while in the hospital. She received blood transfusion that day, and hgb recovered to 8.4 the same day. -hgb down slightly more at 8.6 today (09/19/22)     PLAN: -proceed with C10 gem/Abraxane today and in 1 week, with Abraxane dose reduced to 80 mg/m due to neuropathy -CT 11/13, f/u in one week to review scan     No problem-specific Assessment & Plan notes found for this encounter.   SUMMARY OF ONCOLOGIC HISTORY: Oncology History Overview Note   Cancer Staging  Pancreatic cancer [facility]) Staging form: Exocrine Pancreas, AJCC 8th Edition - Clinical stage from 01/18/2022: Stage IB (cT2, cN0, cM0) - Signed by Truitt Merle, MD on 01/28/2022    Pancreatic cancer ([place])  01/16/2022 Imaging   CLINICAL DATA:  Jaundice   EXAM: CT ABDOMEN AND PELVIS WITH CONTRAST  IMPRESSION: 1. Marked intra and extrahepatic biliary dilatation with marked dilatation of pancreatic duct and distended gallbladder. There is significant atrophy of the body and tail of pancreas. 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 2. Diverticular disease of the  colon without acute wall thickening 3. Mild endometrial thickening up to 9 mm, recommend nonemergent pelvic ultrasound for further evaluation   01/17/2022 Imaging   EXAM: MRI ABDOMEN WITHOUT AND WITH CONTRAST (INCLUDING  MRCP)  IMPRESSION: 1. Obstructing mass within the pancreatic head with an intraluminal component demonstrating low level enhancement. 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 2. This mass causes significant extrinsic narrowing at the portal/superior mesenteric vein confluence as well as marked intra and extrahepatic biliary dilatation. 3. No distant metastases identified. 4. Endoscopy for endoscopic ultrasound, tissue sampling and biliary decompression recommended.   01/18/2022 Cancer Staging   Staging form: Exocrine Pancreas, AJCC 8th Edition - Clinical stage from 01/18/2022: Stage IB (cT2, cN0, cM0) - Signed by Truitt Merle, MD on 01/28/2022 Stage prefix: Initial diagnosis Total positive nodes: 0   01/18/2022 Initial Biopsy   FINAL MICROSCOPIC DIAGNOSIS:  A. PANCREAS, HEAD, FINE NEEDLE ASPIRATION:  - Malignant cells present consistent with adenocarcinoma   01/24/2022 Imaging   EXAM: CT CHEST WITHOUT CONTRAST  IMPRESSION: 1. No evidence of metastatic disease in the chest. 2. Atelectasis or consolidation in the right lower lung. 3. Healing right rib fractures. 4. Mass in the head of the pancreas. See previous report of CT abdomen and pelvis 01/16/2022. Interval placement of the biliary stent.   01/28/2022 Initial Diagnosis   Pancreatic cancer ([place])   02/16/2022 - 06/28/2022 Chemotherapy   Patient is on Treatment Plan : PANCREATIC Abraxane / Gemcitabine D1,8 q21d     02/16/2022 Genetic Testing   Negative genetic testing on the CancerNext-Expanded+RNAinsight panel.  The report date is February 16, 2022.  The CancerNext-Expanded gene panel offered by [facility] and includes sequencing and rearrangement analysis for the following 77 genes: AIP, ALK, APC*, ATM*, AXIN2, BAP1, BARD1, BLM, BMPR1A, BRCA1*, BRCA2*, BRIP1*, CDC73, CDH1*,  CDK4, CDKN1B, CDKN2A, CHEK2*, CTNNA1, DICER1, FANCC, FH, FLCN, GALNT12, KIF1B, LZTR1, MAX, MEN1, MET, MLH1*, MSH2*, MSH3, MSH6*, MUTYH*, NBN, NF1*, NF2, NTHL1, PALB2*, PHOX2B, PMS2*, POT1, PRKAR1A, PTCH1, PTEN*, RAD51C*, RAD51D*, RB1, RECQL, RET, SDHA, SDHAF2, SDHB, SDHC, SDHD, SMAD4, SMARCA4, SMARCB1, SMARCE1, STK11, SUFU, TMEM127, TP53*, TSC1, TSC2, VHL and XRCC2 (sequencing and deletion/duplication); EGFR, EGLN1, HOXB13, KIT, MITF, PDGFRA, POLD1, and POLE (sequencing only); EPCAM and GREM1 (deletion/duplication only). DNA and RNA analyses performed for * genes.    06/14/2022 -  Chemotherapy   Patient is on Treatment Plan : PANCREATIC Abraxane D1,8,15 + Gemcitabine D1,8,15 q28d        INTERVAL HISTORY:  Miranda Francis is here for a follow up of pancreatic cancer. She was last seen by NP Mendel Ryder on 09/06/22. She presents to the clinic accompanied by her husband. She reports she is doing well overall and notes her only complaint is the numbness in her toes.   All other systems were reviewed with the patient and are negative.  MEDICAL HISTORY:  Past Medical History:  Diagnosis Date   Blood transfusion    Diabetes mellitus    Family history of breast cancer    Family history of colon cancer    Family history of prostate cancer    Heart murmur    Hypertension     SURGICAL HISTORY: Past Surgical History:  Procedure Laterality Date   BILIARY STENT PLACEMENT  01/18/2022   Procedure: BILIARY STENT PLACEMENT;  Surgeon: Carol Ada, MD;  Location: [place];  Service: Gastroenterology;;   Brigitte Pulse AND  CURETTAGE OF UTERUS     ERCP N/A 01/18/2022   Procedure: ENDOSCOPIC RETROGRADE CHOLANGIOPANCREATOGRAPHY (ERCP);  Surgeon: Carol Ada, MD;  Location: [place];  Service: Gastroenterology;  Laterality: N/A;   ESOPHAGOGASTRODUODENOSCOPY N/A 01/18/2022   Procedure: ESOPHAGOGASTRODUODENOSCOPY (EGD);  Surgeon: Carol Ada, MD;  Location: [place];  Service: Gastroenterology;  Laterality:  N/A;   EUS  01/18/2022   Procedure: UPPER ENDOSCOPIC ULTRASOUND (EUS) LINEAR;  Surgeon: Carol Ada, MD;  Location: [facility] ENDOSCOPY;  Service: Gastroenterology;;   FINE NEEDLE ASPIRATION  01/18/2022   Procedure: FINE NEEDLE ASPIRATION (FNA) LINEAR;  Surgeon: Carol Ada, MD;  Location: [facility] ENDOSCOPY;  Service: Gastroenterology;;   IR IMAGING GUIDED PORT INSERTION  02/15/2022   NO PAST SURGERIES     SPHINCTEROTOMY  01/18/2022   Procedure: Joan Mayans;  Surgeon: Carol Ada, MD;  Location: [place];  Service: Gastroenterology;;    I have reviewed the social history and family history with the patient and they are unchanged from previous note.  ALLERGIES:  has No Known Allergies.  MEDICATIONS:  Current Outpatient Medications  Medication Sig Dispense Refill   acetaminophen (TYLENOL) 500 MG tablet Take 1,000 mg by mouth every 6 (six) hours as needed for moderate pain or headache.     amLODipine (NORVASC) 5 MG tablet Take 5 mg by mouth daily.     Cholecalciferol (QC VITAMIN D3) 50 MCG (2000 UT) TABS Take 2,000 Units by mouth daily.     Continuous Blood Gluc Sensor (FREESTYLE LIBRE 2 SENSOR) MISC USE 1 SENSOR AND CHANGE EVERY 14 DAYS AS DIRECTED.     gabapentin (NEURONTIN) 100 MG capsule Take 2-3 capsules (200-300 mg total) by mouth at bedtime. 90 capsule 0   KLOR-CON M20 20 MEQ tablet Take 20 mEq by mouth 2 (two) times daily.     lidocaine-prilocaine (EMLA) cream Apply 1 application topically as needed. 30 g 1   losartan (COZAAR) 100 MG tablet Take 100 mg by mouth daily.     NOVOLOG FLEXPEN 100 UNIT/ML FlexPen Inject 15 Units into the skin 2 (two) times daily.     oxyCODONE (OXY IR/ROXICODONE) 5 MG immediate release tablet Take 1 tablet (5 mg total) by mouth every 3 (three) hours as needed for severe pain. 20 tablet 0   polyethylene glycol (MIRALAX / GLYCOLAX) 17 g packet Take 17 g by mouth daily. 14 each 0   zolpidem (AMBIEN) 10 MG tablet Take 10 mg by mouth at bedtime as needed.     No  current facility-administered medications for this visit.   Facility-Administered Medications Ordered in Other Visits  Medication Dose Route Frequency Provider Last Rate Last Admin   0.9 %  sodium chloride infusion   Intravenous Once Causey, Charlestine Massed, NP       sodium chloride flush (NS) 0.9 % injection 10 mL  10 mL Intracatheter PRN Gardenia Phlegm, NP   10 mL at 09/19/22 1613    PHYSICAL EXAMINATION: ECOG PERFORMANCE STATUS: 1 - Symptomatic but completely ambulatory  Vitals:   09/19/22 1313  BP: (!) 143/86  Pulse: 74  Resp: 16  Temp: 99.6 F (37.6 C)  SpO2: 100%   Wt Readings from Last 3 Encounters:  09/19/22 146 lb 8 oz (66.5 kg)  09/12/22 146 lb (66.2 kg)  09/06/22 146 lb 4 oz (66.3 kg)     GENERAL:alert, no distress and comfortable SKIN: skin color normal, no rashes or significant lesions EYES: normal, Conjunctiva are pink and non-injected, sclera clear  NEURO: alert & oriented x 3  with fluent speech  LABORATORY DATA:  I have reviewed the data as listed    Latest Ref Rng & Units 09/19/2022   12:31 PM 09/06/2022    1:28 PM 08/30/2022   11:00 AM  CBC  WBC 4.0 - 10.5 K/uL 5.3  5.0  6.3   Hemoglobin 12.0 - 15.0 g/dL 8.6  9.1  9.7   Hematocrit 36.0 - 46.0 % 26.0  26.6  29.2   Platelets 150 - 400 K/uL 262  130  260         Latest Ref Rng & Units 09/19/2022   12:31 PM 09/06/2022    1:28 PM 08/30/2022   11:00 AM  CMP  Glucose 70 - 99 mg/dL 127  142  200   BUN 8 - 23 mg/dL _0 Creatinine 0.44 - 1.00 mg/dL 0.51  0.52  0.54   Sodium 135 - 145 mmol/L 141  137  139   Potassium 3.5 - 5.1 mmol/L 3.8  3.8  3.7   Chloride 98 - 111 mmol/L 109  107  107   CO2 22 - 32 mmol/L _1 Calcium 8.9 - 10.3 mg/dL 9.0  8.8  9.0   Total Protein 6.5 - 8.1 g/dL 6.5  6.3  6.4   Total Bilirubin 0.3 - 1.2 mg/dL 0.3  0.3  0.3   Alkaline Phos 38 - 126 U/L 192  188  126   AST 15 - 41 U/L _2 ALT 0 - 44 U/L _3 RADIOGRAPHIC  STUDIES: I have personally reviewed the radiological images as listed and agreed with the findings in the report. No results found.    No orders of the defined types were placed in this encounter.  All questions were answered. The patient knows to call the clinic with any problems, questions or concerns. No barriers to learning was detected.      Truitt Merle, MD 09/19/2022   I, Wilburn Mylar, am acting as scribe for Truitt Merle, MD.   I have reviewed the above documentation for accuracy and completeness, and I agree with the above.

## 2022-09-19 NOTE — Telephone Encounter (Signed)
Called patient per 11/8 in basket. Left voicemail of changed appointment times

## 2022-09-19 NOTE — Patient Instructions (Signed)
[facility]  Discharge Instructions: Thank you for choosing [facility] to provide your oncology and hematology care.   If you have a lab appointment with the [facility], please go directly to the [facility] and check in at the registration area.   Wear comfortable clothing and clothing appropriate for easy access to any Portacath or PICC line.   We strive to give you quality time with your provider. You may need to reschedule your appointment if you arrive late (15 or more minutes).  Arriving late affects you and other patients whose appointments are after yours.  Also, if you miss three or more appointments without notifying the office, you may be dismissed from the clinic at the provider's discretion.      For prescription refill requests, have your pharmacy contact our office and allow 72 hours for refills to be completed.    Today you received the following chemotherapy and/or immunotherapy agents: Abraxane, Gemcitabine.       To help prevent nausea and vomiting after your treatment, we encourage you to take your nausea medication as directed.  BELOW ARE SYMPTOMS THAT SHOULD BE REPORTED IMMEDIATELY: *FEVER GREATER THAN 100.4 F (38 C) OR HIGHER *CHILLS OR SWEATING *NAUSEA AND VOMITING THAT IS NOT CONTROLLED WITH YOUR NAUSEA MEDICATION *UNUSUAL SHORTNESS OF BREATH *UNUSUAL BRUISING OR BLEEDING *URINARY PROBLEMS (pain or burning when urinating, or frequent urination) *BOWEL PROBLEMS (unusual diarrhea, constipation, pain near the anus) TENDERNESS IN MOUTH AND THROAT WITH OR WITHOUT PRESENCE OF ULCERS (sore throat, sores in mouth, or a toothache) UNUSUAL RASH, SWELLING OR PAIN  UNUSUAL VAGINAL DISCHARGE OR ITCHING   Items with * indicate a potential emergency and should be followed up as soon as possible or go to the Emergency Department if any problems should occur.  Please show the CHEMOTHERAPY ALERT CARD or IMMUNOTHERAPY ALERT CARD  at check-in to the Emergency Department and triage nurse.  Should you have questions after your visit or need to cancel or reschedule your appointment, please contact [facility]  Dept: [phone]  and follow the prompts.  Office hours are 8:00 a.m. to 4:30 p.m. Monday - Friday. Please note that voicemails left after 4:00 p.m. may not be returned until the following business day.  We are closed weekends and major holidays. You have access to a nurse at all times for urgent questions. Please call the main number to the clinic Dept: [phone] and follow the prompts.   For any non-urgent questions, you may also contact your provider using MyChart. We now offer e-Visits for anyone 71 and older to request care online for non-urgent symptoms. For details visit [email or web address].   Also download the MyChart app! Go to the app store, search "MyChart", open the app, select [facility], and log in with your MyChart username and password.  Masks are optional in the cancer centers. If you would like for your care team to wear a mask while they are taking care of you, please let them know. You may have one support person who is at least 75 years old accompany you for your appointments.

## 2022-09-20 LAB — CANCER ANTIGEN 19-9: CA 19-9: 8998 U/mL — ABNORMAL HIGH (ref 0–35)

## 2022-09-21 ENCOUNTER — Ambulatory Visit: Payer: Medicare HMO | Admitting: Hematology

## 2022-09-21 ENCOUNTER — Ambulatory Visit: Payer: Medicare HMO

## 2022-09-21 ENCOUNTER — Other Ambulatory Visit: Payer: Medicare HMO

## 2022-09-24 ENCOUNTER — Ambulatory Visit (HOSPITAL_COMMUNITY)
Admission: RE | Admit: 2022-09-24 | Discharge: 2022-09-24 | Disposition: A | Discharge status: Home / Self Care | Payer: Medicare HMO | Source: Ambulatory Visit | Attending: Adult Health | Admitting: Adult Health

## 2022-09-24 ENCOUNTER — Encounter (HOSPITAL_COMMUNITY): Payer: Self-pay

## 2022-09-24 DIAGNOSIS — C250 Malignant neoplasm of head of pancreas: Secondary | ICD-10-CM | POA: Insufficient documentation

## 2022-09-24 MED ORDER — IOHEXOL 300 MG/ML  SOLN
100.0000 mL | Freq: Once | INTRAMUSCULAR | Status: AC | PRN
  Administered 2022-09-24: 100 mL via INTRAVENOUS

## 2022-09-24 MED ORDER — SODIUM CHLORIDE (PF) 0.9 % IJ SOLN
INTRAMUSCULAR | Status: AC
  Filled 2022-09-24: qty 50

## 2022-09-25 ENCOUNTER — Telehealth: Payer: Self-pay

## 2022-09-25 MED FILL — Fosaprepitant Dimeglumine For IV Infusion 150 MG (Base Eq): INTRAVENOUS | Qty: 5 | Status: AC

## 2022-09-25 MED FILL — Dexamethasone Sodium Phosphate Inj 100 MG/10ML: INTRAMUSCULAR | Qty: 1 | Status: AC

## 2022-09-25 NOTE — Telephone Encounter (Signed)
S/w [facility] radiology in regards to pt's CT CAP. Report states mets to liver and pancreas; notified MD of results.

## 2022-09-25 NOTE — Telephone Encounter (Signed)
Spoke with pt to let pt know that Dr. Burr Medico is canceling her infusion tomorrow but still wants to see her in clinic on 09/26/2022 for the office visit.  Pt verbalized understanding and confirmed appt.

## 2022-09-26 ENCOUNTER — Ambulatory Visit: Payer: Medicare HMO

## 2022-09-26 ENCOUNTER — Other Ambulatory Visit: Payer: Medicare HMO

## 2022-09-26 ENCOUNTER — Other Ambulatory Visit: Payer: Self-pay

## 2022-09-26 ENCOUNTER — Inpatient Hospital Stay (HOSPITAL_BASED_OUTPATIENT_CLINIC_OR_DEPARTMENT_OTHER): Payer: Medicare HMO | Admitting: Hematology

## 2022-09-26 ENCOUNTER — Encounter: Payer: Self-pay | Admitting: Hematology

## 2022-09-26 ENCOUNTER — Ambulatory Visit: Payer: Medicare HMO | Admitting: Physician Assistant

## 2022-09-26 VITALS — BP 151/94 | HR 88 | Temp 99.0°F | Resp 18 | Ht 63.0 in | Wt 148.1 lb

## 2022-09-26 DIAGNOSIS — C250 Malignant neoplasm of head of pancreas: Secondary | ICD-10-CM

## 2022-09-26 DIAGNOSIS — Z5111 Encounter for antineoplastic chemotherapy: Secondary | ICD-10-CM | POA: Diagnosis not present

## 2022-09-26 NOTE — Progress Notes (Signed)
DISCONTINUE ON PATHWAY REGIMEN - Pancreatic Adenocarcinoma     A cycle is every 28 days:     Nab-paclitaxel (protein bound)      Gemcitabine   **Always confirm dose/schedule in your pharmacy ordering system**  REASON: Continuation Of Treatment PRIOR TREATMENT: PANOS58: Nab-Paclitaxel (Abraxane) 125 mg/m2 D1, 8, 15 + Gemcitabine 1,000 mg/m2 D1, 8, 15 q28 Days x 2 Cycles TREATMENT RESPONSE: Partial Response (PR)  START OFF PATHWAY REGIMEN - Pancreatic Adenocarcinoma   [id number]:Fluorouracil 1,200 mg/m2/day CIV D1-2 + Leucovorin 400 mg/m2 IV D1 + Liposomal Irinotecan 70 mg/m2 IV D1 q14 Days:   A cycle is every 14 days:     Liposomal irinotecan      Leucovorin      Fluorouracil   **Always confirm dose/schedule in your pharmacy ordering system**  Patient Characteristics: Preoperative (Clinical Staging), Metastatic Disease Therapeutic Status: Preoperative (Clinical Staging) AJCC T Category: cT2 AJCC N Category: cN0 Resectability Status: Borderline Resectable AJCC M Category: cM1 AJCC 8 Stage Grouping: IV Intent of Therapy: Non-Curative / Palliative Intent, Discussed with Patient

## 2022-09-26 NOTE — Progress Notes (Signed)
Miranda [facility]   Telephone:(336) [phone] Fax:(336) [phone]   Clinic Follow up Note   Patient Care Team: [name], MD as PCP - General (Internal Medicine) [name], MD as PCP - Cardiology (Cardiology) Truitt Merle, MD as Consulting Physician (Oncology)  Date of Service:  09/26/2022  CHIEF COMPLAINT: f/u of pancreatic cancer  CURRENT THERAPY:  Pending second-line FOLFIRI  ASSESSMENT & PLAN:  Miranda Francis is a 76 y.o. female with   1. Pancreatic Cancer, stage IB c(T2, N0, Mx) with indeterminate liver lesions, borderline resectable, MMR normal   -presented to PCP with jaundice for one month. Also complained of epigastric pain, poor appetite, weight loss, and light-colored stool. Found to have elevated liver enzymes and referred to ED on 01/16/22. CMP in ED showed tbili at 39.3. CT AP showed: marked biliary dilatation; soft tissue mass at pancreatic head with marked narrowing of SMV; diverticular disease of colon.  No distant metastasis on CT scan. -baseline CA 19-9 on 01/16/22 was elevated at 9,561. -MRCP abdomen MRI on 01/17/22 showed: obstructing mass within pancreatic head with low-level enhancement, causing significant extrinsic narrowing at SMV and dilatation. -ERCP on 01/18/22 by Dr. Benson Norway, FNA of pancreas head mass showed malignant cells consistent with adenocarcinoma. MMR normal. -she began neoadjuvant gemcitabine alone on 02/16/22, abraxane was added with C2 (03/08/22).   -abdomen MRI on 06/19/22 showed: subtle liver lesions are stable from MRI in 01/2022 (not reported then); no change in pancreatic mass.  -Dr. Zenia Resides feels she is not a good surgical candidate due to he indeterminate liver lesions and high CA19.9. -CA 19.9 began rising significantly in 08/2022, last was 8.998 on 09/19/22. -restaging CT CAP on 09/24/22 showed: enlargement of two liver lesions; stable pancreatic head mass; no adenopathy.  -I reviewed the results with them today. I again discussed changing  treatment. My recommendation is for FOLFIRI (5FU pump / liposomal irinotecan). I briefly explained the expected AE's. She agrees to proceed. Will plan to start after Thanksgiving.   2. Neuropathy secondary to chemo, grade 1 -She has developed numbness on her toes, no impact on her function, no neuropathy in her hands. -We reduced her Abraxane dose, will monitor closely.   3. Anemia -hgb dropped to 6.4 by 01/25/22 while in the hospital. She received blood transfusion that day, and hgb recovered to 8.4 the same day.     PLAN: -CT reviewed, unfortunately she has cancer progression in liver -We will change her chemotherapy to second line liposomal irinotecan and 5-FU every 2 weeks, starting 1 week after Thanksgiving   No problem-specific Assessment & Plan notes found for this encounter.   SUMMARY OF ONCOLOGIC HISTORY: Oncology History Overview Note   Cancer Staging  Pancreatic cancer [facility]) Staging form: Exocrine Pancreas, AJCC 8th Edition - Clinical stage from 01/18/2022: Stage IB (cT2, cN0, cM0) - Signed by Truitt Merle, MD on 01/28/2022    Pancreatic cancer ([place])  01/16/2022 Imaging   CLINICAL DATA:  Jaundice   EXAM: CT ABDOMEN AND PELVIS WITH CONTRAST  IMPRESSION: 1. Marked intra and extrahepatic biliary dilatation with marked dilatation of pancreatic duct and distended gallbladder. There is significant atrophy of the body and tail of pancreas. 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 2. Diverticular disease of the colon without acute wall thickening 3. Mild endometrial thickening up to 9 mm, recommend nonemergent pelvic ultrasound for further evaluation  01/17/2022 Imaging   EXAM: MRI ABDOMEN WITHOUT AND WITH CONTRAST (INCLUDING MRCP)  IMPRESSION: 1. Obstructing mass within the pancreatic head with an intraluminal component  demonstrating low level enhancement. 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 2. This mass causes significant extrinsic narrowing at the portal/superior mesenteric vein confluence as well as marked intra and extrahepatic biliary dilatation. 3. No distant metastases identified. 4. Endoscopy for endoscopic ultrasound, tissue sampling and biliary decompression recommended.   01/18/2022 Cancer Staging   Staging form: Exocrine Pancreas, AJCC 8th Edition - Clinical stage from 01/18/2022: Stage IB (cT2, cN0, cM0) - Signed by Truitt Merle, MD on 01/28/2022 Stage prefix: Initial diagnosis Total positive nodes: 0   01/18/2022 Initial Biopsy   FINAL MICROSCOPIC DIAGNOSIS:  A. PANCREAS, HEAD, FINE NEEDLE ASPIRATION:  - Malignant cells present consistent with adenocarcinoma   01/24/2022 Imaging   EXAM: CT CHEST WITHOUT CONTRAST  IMPRESSION: 1. No evidence of metastatic disease in the chest. 2. Atelectasis or consolidation in the right lower lung. 3. Healing right rib fractures. 4. Mass in the head of the pancreas. See previous report of CT abdomen and pelvis 01/16/2022. Interval placement of the biliary stent.   01/28/2022 Initial Diagnosis   Pancreatic cancer ([place])   02/16/2022 - 06/28/2022 Chemotherapy   Patient is on Treatment Plan : PANCREATIC Abraxane / Gemcitabine D1,8 q21d     02/16/2022 Genetic Testing   Negative genetic testing on the CancerNext-Expanded+RNAinsight panel.  The report date is February 16, 2022.  The CancerNext-Expanded gene panel offered by [facility] and includes sequencing and rearrangement analysis for the following 77 genes: AIP, ALK, APC*, ATM*, AXIN2, BAP1, BARD1, BLM, BMPR1A, BRCA1*, BRCA2*, BRIP1*, CDC73, CDH1*, CDK4, CDKN1B, CDKN2A, CHEK2*, CTNNA1, DICER1, FANCC, FH, FLCN, GALNT12, KIF1B, LZTR1, MAX, MEN1,  MET, MLH1*, MSH2*, MSH3, MSH6*, MUTYH*, NBN, NF1*, NF2, NTHL1, PALB2*, PHOX2B, PMS2*, POT1, PRKAR1A, PTCH1, PTEN*, RAD51C*, RAD51D*, RB1, RECQL, RET, SDHA, SDHAF2, SDHB, SDHC, SDHD, SMAD4, SMARCA4, SMARCB1, SMARCE1, STK11, SUFU, TMEM127, TP53*, TSC1, TSC2, VHL and XRCC2 (sequencing and deletion/duplication); EGFR, EGLN1, HOXB13, KIT, MITF, PDGFRA, POLD1, and POLE (sequencing only); EPCAM and GREM1 (deletion/duplication only). DNA and RNA analyses performed for * genes.    06/14/2022 - 09/19/2022 Chemotherapy   Patient is on Treatment Plan : PANCREATIC Abraxane D1,8,15 + Gemcitabine D1,8,15 q28d     10/11/2022 -  Chemotherapy   Patient is on Treatment Plan : PANCREAS Liposomal Irinotecan + Leucovorin + 5-FU IVCI q14d        INTERVAL HISTORY:  Miranda Francis is here for a follow up of pancreatic cancer. She was last seen by me on 09/19/22. She presents to the clinic accompanied by her husband. She reports she is doing well overall. She denies any new pains or concerns, notes ongoing neuropathy.   All other systems were reviewed with the patient and are negative.  MEDICAL HISTORY:  Past Medical History:  Diagnosis Date   Blood transfusion    Diabetes mellitus    Family history of breast cancer    Family history of colon cancer    Family history of prostate cancer    Heart murmur    Hypertension    pancreatic ca 12/2021    SURGICAL HISTORY: Past Surgical History:  Procedure Laterality Date   BILIARY STENT PLACEMENT  01/18/2022   Procedure: BILIARY STENT PLACEMENT;  Surgeon: Carol Ada, MD;  Location: [facility];  Service: Gastroenterology;;   DILATION AND CURETTAGE OF UTERUS     ERCP N/A 01/18/2022   Procedure: ENDOSCOPIC RETROGRADE CHOLANGIOPANCREATOGRAPHY (ERCP);  Surgeon: Carol Ada, MD;  Location: [facility];  Service: Gastroenterology;  Laterality: N/A;   ESOPHAGOGASTRODUODENOSCOPY N/A 01/18/2022   Procedure: ESOPHAGOGASTRODUODENOSCOPY (EGD);  Surgeon: Carol Ada, MD;   Location: [facility];  Service: Gastroenterology;  Laterality: N/A;   EUS  01/18/2022   Procedure: UPPER ENDOSCOPIC ULTRASOUND (EUS) LINEAR;  Surgeon: Carol Ada, MD;  Location[facility] ENDOSCOPY;  Service: Gastroenterology;;   FINE NEEDLE ASPIRATION  01/18/2022   Procedure: FINE NEEDLE ASPIRATION (FNA) LINEAR;  Surgeon: Carol Ada, MD;  Location: [facility] ENDOSCOPY;  Service: Gastroenterology;;   IR IMAGING GUIDED PORT INSERTION  02/15/2022   NO PAST SURGERIES     SPHINCTEROTOMY  01/18/2022   Procedure: Joan Mayans;  Surgeon: Carol Ada, MD;  Location: [facility];  Service: Gastroenterology;;    I have reviewed the social history and family history with the patient and they are unchanged from previous note.  ALLERGIES:  has No Known Allergies.  MEDICATIONS:  Current Outpatient Medications  Medication Sig Dispense Refill   acetaminophen (TYLENOL) 500 MG tablet Take 1,000 mg by mouth every 6 (six) hours as needed for moderate pain or headache.     amLODipine (NORVASC) 5 MG tablet Take 5 mg by mouth daily.     Cholecalciferol (QC VITAMIN D3) 50 MCG (2000 UT) TABS Take 2,000 Units by mouth daily.     Continuous Blood Gluc Sensor (FREESTYLE LIBRE 2 SENSOR) MISC USE 1 SENSOR AND CHANGE EVERY 14 DAYS AS DIRECTED.     gabapentin (NEURONTIN) 100 MG capsule Take 2-3 capsules (200-300 mg total) by mouth at bedtime. 90 capsule 0   KLOR-CON M20 20 MEQ tablet Take 20 mEq by mouth 2 (two) times daily.     lidocaine-prilocaine (EMLA) cream Apply 1 application topically as needed. 30 g 1   losartan (COZAAR) 100 MG tablet Take 100 mg by mouth daily.     NOVOLOG FLEXPEN 100 UNIT/ML FlexPen Inject 15 Units into the skin 2 (two) times daily.     oxyCODONE (OXY IR/ROXICODONE) 5 MG immediate release tablet Take 1 tablet (5 mg total) by mouth every 3 (three) hours as needed for severe pain. 20 tablet 0   polyethylene glycol (MIRALAX / GLYCOLAX) 17 g packet Take 17 g by mouth daily. 14 each 0   zolpidem (AMBIEN)  10 MG tablet Take 10 mg by mouth at bedtime as needed.     No current facility-administered medications for this visit.    PHYSICAL EXAMINATION: ECOG PERFORMANCE STATUS: 1 - Symptomatic but completely ambulatory  Vitals:   09/26/22 1150  BP: (!) 151/94  Pulse: 88  Resp: 18  Temp: 99 F (37.2 C)  SpO2: 100%   Wt Readings from Last 3 Encounters:  09/26/22 148 lb 1.6 oz (67.2 kg)  09/19/22 146 lb 8 oz (66.5 kg)  09/12/22 146 lb (66.2 kg)     GENERAL:alert, no distress and comfortable SKIN: skin color normal, no rashes or significant lesions EYES: normal, Conjunctiva are pink and non-injected, sclera clear  NEURO: alert & oriented x 3 with fluent speech  LABORATORY DATA:  I have reviewed the data as listed    Latest Ref Rng & Units 09/19/2022   12:31 PM 09/06/2022    1:28 PM 08/30/2022   11:00 AM  CBC  WBC 4.0 - 10.5 K/uL 5.3  5.0  6.3   Hemoglobin 12.0 - 15.0 g/dL 8.6  9.1  9.7   Hematocrit 36.0 - 46.0 % 26.0  26.6  29.2   Platelets 150 - 400 K/uL 262  130  260         Latest Ref Rng & Units 09/19/2022   12:31 PM 09/06/2022    1:28 PM 08/30/2022   11:00 AM  CMP  Glucose 70 - 99 mg/dL 127  142  200   BUN 8 - 23 mg/dL _0 Creatinine 0.44 - 1.00 mg/dL 0.51  0.52  0.54   Sodium 135 - 145 mmol/L 141  137  139   Potassium 3.5 - 5.1 mmol/L 3.8  3.8  3.7   Chloride 98 - 111 mmol/L 109  107  107   CO2 22 - 32 mmol/L _1 Calcium 8.9 - 10.3 mg/dL 9.0  8.8  9.0   Total Protein 6.5 - 8.1 g/dL 6.5  6.3  6.4   Total Bilirubin 0.3 - 1.2 mg/dL 0.3  0.3  0.3   Alkaline Phos 38 - 126 U/L 192  188  126   AST 15 - 41 U/L _2 ALT 0 - 44 U/L _3 RADIOGRAPHIC STUDIES: I have personally reviewed the radiological images as listed and agreed with the findings in the report. No results found.    Orders Placed This Encounter  Procedures   CBC with Differential ([facility] Only)    Standing Status:   Future    Standing Expiration  Date:   10/12/2023   CMP ([facility] only)    Standing Status:   Future    Standing Expiration Date:   10/12/2023   CBC with Differential ([facility] Only)    Standing Status:   Future    Standing Expiration Date:   10/26/2023   CMP ([facility] only)    Standing Status:   Future    Standing Expiration Date:   10/26/2023   All questions were answered. The patient knows to call the clinic with any problems, questions or concerns. No barriers to learning was detected. The total time spent in the appointment was 40 minutes.     Truitt Merle, MD 09/26/2022   I, Wilburn Mylar, am acting as scribe for Truitt Merle, MD.   I have reviewed the above documentation for accuracy and completeness, and I agree with the above.

## 2022-09-29 ENCOUNTER — Other Ambulatory Visit: Payer: Self-pay

## 2022-10-03 ENCOUNTER — Other Ambulatory Visit: Payer: Self-pay

## 2022-10-05 ENCOUNTER — Ambulatory Visit: Payer: Medicare HMO | Admitting: Adult Health

## 2022-10-05 ENCOUNTER — Other Ambulatory Visit: Payer: Medicare HMO

## 2022-10-05 ENCOUNTER — Ambulatory Visit: Payer: Medicare HMO

## 2022-10-08 ENCOUNTER — Other Ambulatory Visit: Payer: Self-pay

## 2022-10-08 ENCOUNTER — Other Ambulatory Visit (HOSPITAL_BASED_OUTPATIENT_CLINIC_OR_DEPARTMENT_OTHER): Payer: Self-pay

## 2022-10-08 DIAGNOSIS — C250 Malignant neoplasm of head of pancreas: Secondary | ICD-10-CM

## 2022-10-08 MED ORDER — DEXAMETHASONE 4 MG PO TABS
8.0000 mg | ORAL_TABLET | Freq: Every day | ORAL | 5 refills | Status: DC
  Filled 2022-10-08: qty 8, 4d supply, fill #0

## 2022-10-08 MED ORDER — ONDANSETRON HCL 8 MG PO TABS
8.0000 mg | ORAL_TABLET | Freq: Three times a day (TID) | ORAL | 1 refills | Status: DC | PRN
  Filled 2022-10-08: qty 30, 10d supply, fill #0

## 2022-10-08 MED ORDER — PROCHLORPERAZINE MALEATE 10 MG PO TABS
10.0000 mg | ORAL_TABLET | Freq: Four times a day (QID) | ORAL | 1 refills | Status: DC | PRN
  Filled 2022-10-08: qty 30, 8d supply, fill #0

## 2022-10-09 ENCOUNTER — Other Ambulatory Visit: Payer: Self-pay

## 2022-10-09 MED FILL — Dexamethasone Sodium Phosphate Inj 100 MG/10ML: INTRAMUSCULAR | Qty: 1 | Status: AC

## 2022-10-09 NOTE — Assessment & Plan Note (Deleted)
-  stage IB c(T2, N0, Mx) with indeterminate liver lesions, borderline resectable, MMR normal    -diagnosed 01/2022 by ERCP for jaundice, epigastric pain. No distant metastasis on CT scan. -baseline CA 19-9 on 01/16/22 was elevated at 9,561  -she began neoadjuvant gemcitabine alone on 02/16/22, abraxane was added with C2 (03/08/22).  -Dr. Zenia Resides feels she is not a good surgical candidate due to the indeterminate liver lesions and high CA19.9.

## 2022-10-09 NOTE — Progress Notes (Deleted)
[place]   [phone] [phone]   Clinic Follow up Note   Patient Care Team: [name], MD as PCP - General (Internal Medicine) [name], MD as PCP - Cardiology (Cardiology) Truitt Merle, MD as Consulting Physician (Oncology)  Date of Service:  10/09/2022  CHIEF COMPLAINT: f/u of pancreatic cancer   CURRENT THERAPY:   Pending second-line FOLFIRI    ASSESSMENT: *** Miranda Francis is a 76 y.o. female with    1. Pancreatic Cancer, stage IB c(T2, N0, Mx) with indeterminate liver lesions, borderline resectable, MMR normal   -presented to PCP with jaundice for one month. Also complained of epigastric pain, poor appetite, weight loss, and light-colored stool. Found to have elevated liver enzymes and referred to ED on 01/16/22. CMP in ED showed tbili at 39.3. CT AP showed: marked biliary dilatation; soft tissue mass at pancreatic head with marked narrowing of SMV; diverticular disease of colon.  No distant metastasis on CT scan. -baseline CA 19-9 on 01/16/22 was elevated at 9,561. -MRCP abdomen MRI on 01/17/22 showed: obstructing mass within pancreatic head with low-level enhancement, causing significant extrinsic narrowing at SMV and dilatation. -ERCP on 01/18/22 by Dr. Benson Norway, FNA of pancreas head mass showed malignant cells consistent with adenocarcinoma. MMR normal. -she began neoadjuvant gemcitabine alone on 02/16/22, abraxane was added with C2 (03/08/22).   -abdomen MRI on 06/19/22 showed: subtle liver lesions are stable from MRI in 01/2022 (not reported then); no change in pancreatic mass.  -Dr. Zenia Resides feels she is not a good surgical candidate due to he indeterminate liver lesions and high CA19.9. -CA 19.9 began rising significantly in 08/2022, last was 8.998 on 09/19/22. -restaging CT CAP on 09/24/22 showed: enlargement of two liver lesions; stable pancreatic head mass; no adenopathy.  -I reviewed the results with them today. I again discussed  changing treatment. My recommendation is for FOLFIRI (5FU pump / liposomal irinotecan). I briefly explained the expected AE's. She agrees to proceed. Will plan to start after Thanksgiving.   2. Neuropathy secondary to chemo, grade 1 -She has developed numbness on her toes, no impact on her function, no neuropathy in her hands. -We reduced her Abraxane dose, will monitor closely.   3. Anemia -hgb dropped to 6.4 by 01/25/22 while in the hospital. She received blood transfusion that day, and hgb recovered to 8.4 the same day.  No problem-specific Assessment & Plan notes found for this encounter.  ***   PLAN:  SUMMARY OF ONCOLOGIC HISTORY: Oncology History Overview Note   Cancer Staging  Pancreatic cancer [facility]) Staging form: Exocrine Pancreas, AJCC 8th Edition - Clinical stage from 01/18/2022: Stage IB (cT2, cN0, cM0) - Signed by Truitt Merle, MD on 01/28/2022    Pancreatic cancer ([place])  01/16/2022 Imaging   CLINICAL DATA:  Jaundice   EXAM: CT ABDOMEN AND PELVIS WITH CONTRAST  IMPRESSION: 1. Marked intra and extrahepatic biliary dilatation with marked dilatation of pancreatic duct and distended gallbladder. There is significant atrophy of the body and tail of pancreas. 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 2. Diverticular disease of the colon without acute wall thickening 3. Mild endometrial thickening up to 9 mm, recommend nonemergent pelvic ultrasound for further evaluation   01/17/2022 Imaging   EXAM: MRI ABDOMEN WITHOUT AND WITH CONTRAST (INCLUDING MRCP)  IMPRESSION: 1. Obstructing mass within the pancreatic head with an intraluminal component demonstrating low  level enhancement. 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 2. This mass causes significant extrinsic narrowing at the portal/superior mesenteric vein confluence as well as marked intra and extrahepatic biliary dilatation. 3. No distant metastases identified. 4. Endoscopy for endoscopic ultrasound, tissue sampling and biliary decompression recommended.   01/18/2022 Cancer Staging   Staging form: Exocrine Pancreas, AJCC 8th Edition - Clinical stage from 01/18/2022: Stage IB (cT2, cN0, cM0) - Signed by Truitt Merle, MD on 01/28/2022 Stage prefix: Initial diagnosis Total positive nodes: 0   01/18/2022 Initial Biopsy   FINAL MICROSCOPIC DIAGNOSIS:  A. PANCREAS, HEAD, FINE NEEDLE ASPIRATION:  - Malignant cells present consistent with adenocarcinoma   01/24/2022 Imaging   EXAM: CT CHEST WITHOUT CONTRAST  IMPRESSION: 1. No evidence of metastatic disease in the chest. 2. Atelectasis or consolidation in the right lower lung. 3. Healing right rib fractures. 4. Mass in the head of the pancreas. See previous report of CT abdomen and pelvis 01/16/2022. Interval placement of the biliary stent.   01/28/2022 Initial Diagnosis   Pancreatic cancer ([place])   02/16/2022 - 06/28/2022 Chemotherapy   Patient is on Treatment Plan : PANCREATIC Abraxane / Gemcitabine D1,8 q21d     02/16/2022 Genetic Testing   Negative genetic testing on the CancerNext-Expanded+RNAinsight panel.  The report date is February 16, 2022.  The CancerNext-Expanded gene panel offered by [facility] and includes sequencing and rearrangement analysis for the following 77 genes: AIP, ALK, APC*, ATM*, AXIN2, BAP1, BARD1, BLM, BMPR1A, BRCA1*, BRCA2*, BRIP1*, CDC73, CDH1*, CDK4, CDKN1B, CDKN2A, CHEK2*, CTNNA1, DICER1, FANCC, FH, FLCN, GALNT12, KIF1B, LZTR1, MAX, MEN1, MET, MLH1*, MSH2*, MSH3, MSH6*, MUTYH*, NBN, NF1*, NF2, NTHL1, PALB2*, PHOX2B, PMS2*, POT1, PRKAR1A, PTCH1, PTEN*, RAD51C*, RAD51D*, RB1, RECQL, RET, SDHA, SDHAF2, SDHB, SDHC, SDHD, SMAD4,  SMARCA4, SMARCB1, SMARCE1, STK11, SUFU, TMEM127, TP53*, TSC1, TSC2, VHL and XRCC2 (sequencing and deletion/duplication); EGFR, EGLN1, HOXB13, KIT, MITF, PDGFRA, POLD1, and POLE (sequencing only); EPCAM and GREM1 (deletion/duplication only). DNA and RNA analyses performed for * genes.    06/14/2022 - 09/19/2022 Chemotherapy   Patient is on Treatment Plan : PANCREATIC Abraxane D1,8,15 + Gemcitabine D1,8,15 q28d     10/11/2022 -  Chemotherapy   Patient is on Treatment Plan : PANCREAS Liposomal Irinotecan + Leucovorin + 5-FU IVCI q14d        INTERVAL HISTORY: *** Miranda Francis is here for a follow up of pancreatic cancer .She was last seen by me on 09/26/2022 She presents to the clinic    All other systems were reviewed with the patient and are negative.  MEDICAL HISTORY:  Past Medical History:  Diagnosis Date   Blood transfusion    Diabetes mellitus    Family history of breast cancer    Family history of colon cancer    Family history of prostate cancer    Heart murmur    Hypertension    pancreatic ca 12/2021    SURGICAL HISTORY: Past Surgical History:  Procedure Laterality Date   BILIARY STENT PLACEMENT  01/18/2022   Procedure: BILIARY STENT PLACEMENT;  Surgeon: Carol Ada, MD;  Location: [place];  Service: Gastroenterology;;   DILATION AND CURETTAGE OF UTERUS     ERCP N/A 01/18/2022   Procedure: ENDOSCOPIC RETROGRADE CHOLANGIOPANCREATOGRAPHY (ERCP);  Surgeon: Carol Ada, MD;  Location: [place];  Service: Gastroenterology;  Laterality: N/A;   ESOPHAGOGASTRODUODENOSCOPY N/A 01/18/2022   Procedure: ESOPHAGOGASTRODUODENOSCOPY (EGD);  Surgeon: Carol Ada, MD;  Location: [place];  Service: Gastroenterology;  Laterality: N/A;   EUS  01/18/2022   Procedure: UPPER ENDOSCOPIC ULTRASOUND (EUS) LINEAR;  Surgeon: Carol Ada, MD;  Location: [facility] ENDOSCOPY;  Service: Gastroenterology;;   FINE NEEDLE ASPIRATION  01/18/2022   Procedure: FINE NEEDLE ASPIRATION (FNA) LINEAR;   Surgeon: Carol Ada, MD;  Location[facility] ENDOSCOPY;  Service: Gastroenterology;;   IR IMAGING GUIDED PORT INSERTION  02/15/2022   NO PAST SURGERIES     SPHINCTEROTOMY  01/18/2022   Procedure: Joan Mayans;  Surgeon: Carol Ada, MD;  Location: [place];  Service: Gastroenterology;;    I have reviewed the social history and family history with the patient and they are unchanged from previous note.  ALLERGIES:  has No Known Allergies.  MEDICATIONS:  Current Outpatient Medications  Medication Sig Dispense Refill   acetaminophen (TYLENOL) 500 MG tablet Take 1,000 mg by mouth every 6 (six) hours as needed for moderate pain or headache.     amLODipine (NORVASC) 5 MG tablet Take 5 mg by mouth daily.     Cholecalciferol (QC VITAMIN D3) 50 MCG (2000 UT) TABS Take 2,000 Units by mouth daily.     Continuous Blood Gluc Sensor (FREESTYLE LIBRE 2 SENSOR) MISC USE 1 SENSOR AND CHANGE EVERY 14 DAYS AS DIRECTED.     dexamethasone (DECADRON) 4 MG tablet Take 2 tablets (8 mg total) by mouth daily. Start the day after irinotecan chemotherapy for 2 days. Take with food. 8 tablet 5   gabapentin (NEURONTIN) 100 MG capsule Take 2-3 capsules (200-300 mg total) by mouth at bedtime. 90 capsule 0   KLOR-CON M20 20 MEQ tablet Take 20 mEq by mouth 2 (two) times daily.     lidocaine-prilocaine (EMLA) cream Apply 1 application topically as needed. 30 g 1   losartan (COZAAR) 100 MG tablet Take 100 mg by mouth daily.     NOVOLOG FLEXPEN 100 UNIT/ML FlexPen Inject 15 Units into the skin 2 (two) times daily.     ondansetron (ZOFRAN) 8 MG tablet Take 1 tablet (8 mg total) by mouth every 8 (eight) hours as needed for nausea or vomiting. Start on the third day after irinotecan 30 tablet 1   oxyCODONE (OXY IR/ROXICODONE) 5 MG immediate release tablet Take 1 tablet (5 mg total) by mouth every 3 (three) hours as needed for severe pain. 20 tablet 0   polyethylene glycol (MIRALAX / GLYCOLAX) 17 g packet Take 17 g by mouth daily.  14 each 0   prochlorperazine (COMPAZINE) 10 MG tablet Take 1 tablet (10 mg total) by mouth every 6 (six) hours as needed for nausea or vomiting. 30 tablet 1   zolpidem (AMBIEN) 10 MG tablet Take 10 mg by mouth at bedtime as needed.     No current facility-administered medications for this visit.    PHYSICAL EXAMINATION: ECOG PERFORMANCE STATUS: {CHL ONC ECOG PS:[phone]}  There were no vitals filed for this visit. Wt Readings from Last 3 Encounters:  09/26/22 148 lb 1.6 oz (67.2 kg)  09/19/22 146 lb 8 oz (66.5 kg)  09/12/22 146 lb (66.2 kg)    {Only keep what was examined. If exam not performed, can use .CEXAM } GENERAL:alert, no distress and comfortable SKIN: skin color, texture, turgor are normal, no rashes or significant lesions EYES: normal, Conjunctiva are pink and non-injected, sclera clear {OROPHARYNX:no exudate, no erythema and lips, buccal mucosa, and tongue normal}  NECK: supple, thyroid normal size, non-tender, without nodularity LYMPH:  no palpable lymphadenopathy in  the cervical, axillary {or inguinal} LUNGS: clear to auscultation and percussion with normal breathing effort HEART: regular rate & rhythm and no murmurs and no lower extremity edema ABDOMEN:abdomen soft, non-tender and normal bowel sounds Musculoskeletal:no cyanosis of digits and no clubbing  NEURO: alert & oriented x 3 with fluent speech, no focal motor/sensory deficits  LABORATORY DATA:  I have reviewed the data as listed    Latest Ref Rng & Units 09/19/2022   12:31 PM 09/06/2022    1:28 PM 08/30/2022   11:00 AM  CBC  WBC 4.0 - 10.5 K/uL 5.3  5.0  6.3   Hemoglobin 12.0 - 15.0 g/dL 8.6  9.1  9.7   Hematocrit 36.0 - 46.0 % 26.0  26.6  29.2   Platelets 150 - 400 K/uL 262  130  260         Latest Ref Rng & Units 09/19/2022   12:31 PM 09/06/2022    1:28 PM 08/30/2022   11:00 AM  CMP  Glucose 70 - 99 mg/dL 127  142  200   BUN 8 - 23 mg/dL _0 Creatinine 0.44 - 1.00 mg/dL 0.51  0.52   0.54   Sodium 135 - 145 mmol/L 141  137  139   Potassium 3.5 - 5.1 mmol/L 3.8  3.8  3.7   Chloride 98 - 111 mmol/L 109  107  107   CO2 22 - 32 mmol/L _1 Calcium 8.9 - 10.3 mg/dL 9.0  8.8  9.0   Total Protein 6.5 - 8.1 g/dL 6.5  6.3  6.4   Total Bilirubin 0.3 - 1.2 mg/dL 0.3  0.3  0.3   Alkaline Phos 38 - 126 U/L 192  188  126   AST 15 - 41 U/L _2 ALT 0 - 44 U/L _3 RADIOGRAPHIC STUDIES: I have personally reviewed the radiological images as listed and agreed with the findings in the report. No results found.    No orders of the defined types were placed in this encounter.  All questions were answered. The patient knows to call the clinic with any problems, questions or concerns. No barriers to learning was detected. The total time spent in the appointment was {CHL ONC TIME VISIT - KKDPT:[id number]}.     Baldemar Friday, CMA 10/09/2022   I, Audry Riles, CMA, am acting as scribe for Truitt Merle, MD.   {Add scribe attestation statement}

## 2022-10-10 ENCOUNTER — Inpatient Hospital Stay: Payer: Medicare HMO | Admitting: Hematology

## 2022-10-10 ENCOUNTER — Inpatient Hospital Stay: Payer: Medicare HMO

## 2022-10-10 ENCOUNTER — Telehealth: Payer: Self-pay | Admitting: Hematology

## 2022-10-10 DIAGNOSIS — C250 Malignant neoplasm of head of pancreas: Secondary | ICD-10-CM

## 2022-10-10 NOTE — Progress Notes (Unsigned)
[place]   [phone] [phone]   Clinic Follow up Note   Patient Care Team: [name], MD as PCP - General (Internal Medicine) [name], MD as PCP - Cardiology (Cardiology) Truitt Merle, MD as Consulting Physician (Oncology)  Date of Service:  10/11/2022  CHIEF COMPLAINT: f/u of  pancreatic cancer    CURRENT THERAPY:  Pending second-line FOLFIRI   ASSESSMENT:  Miranda Francis is a 76 y.o. female with    1. Pancreatic Cancer, stage IV c(T2, N0, M1) with liver metastasis, MMR normal   -Diagnosed in 01/2022. presented to PCP with jaundice for one month. Also complained of epigastric pain, poor appetite, weight loss, and light-colored stool.  -she began neoadjuvant gemcitabine alone on 02/16/22, abraxane was added with C2 (03/08/22).   -abdomen MRI on 06/19/22 showed: subtle liver lesions are stable from MRI in 01/2022 (not reported then); no change in pancreatic mass.  -Dr. Zenia Resides feels she is not a good surgical candidate due to he indeterminate liver lesions and high CA19.9 over one thousand. -restaging CT CAP on 09/24/22 showed: enlargement of two liver lesions; stable pancreatic head mass; no adenopathy.  -I changed her treatment to second line FOLFIRI (5FU pump / liposomal irinotecan). Plan to start today    2. Neuropathy secondary to chemo, grade 1 -She has developed numbness on her toes, no impact on her function, no neuropathy in her hands. -We reduced her Abraxane dose, will monitor closely.   3. Anemia -hgb dropped to 6.4 by 01/25/22 while in the hospital. She received blood transfusion that day, and hgb recovered to 8.4 the same day.    PLAN: - Discuss with Nutrients with pt -Change Tx to second line liposomal irinotecan and 5-FU Day 1 Cycle 1, will reduce liposomal irinotecan dose for first cycle to see how she does Lab, flush, f/u and chemo FOLFIRI with lip-irinotecan in 2 wks, if she tolerated first cycle well, will  increase chemo dose to full dose on cycle 2. -After Cycle 5 repeat CT Scan   SUMMARY OF ONCOLOGIC HISTORY: Oncology History Overview Note   Cancer Staging  Pancreatic cancer [facility]) Staging form: Exocrine Pancreas, AJCC 8th Edition - Clinical stage from 01/18/2022: Stage IB (cT2, cN0, cM0) - Signed by Truitt Merle, MD on 01/28/2022    Pancreatic cancer ([place])  01/16/2022 Imaging   CLINICAL DATA:  Jaundice   EXAM: CT ABDOMEN AND PELVIS WITH CONTRAST  IMPRESSION: 1. Marked intra and extrahepatic biliary dilatation with marked dilatation of pancreatic duct and distended gallbladder. There is significant atrophy of the body and tail of pancreas. 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 2. Diverticular disease of the colon without acute wall thickening 3. Mild endometrial thickening up to 9 mm, recommend nonemergent pelvic ultrasound for further evaluation   01/17/2022 Imaging   EXAM: MRI ABDOMEN WITHOUT AND WITH CONTRAST (INCLUDING MRCP)  IMPRESSION: 1. Obstructing mass within the pancreatic head with an intraluminal component demonstrating low level enhancement. 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 2. This mass causes significant extrinsic narrowing at the portal/superior mesenteric vein confluence as well as marked intra and extrahepatic biliary dilatation. 3. No distant metastases identified. 4. Endoscopy for endoscopic ultrasound, tissue sampling and biliary decompression recommended.  01/18/2022 Cancer Staging   Staging form: Exocrine Pancreas, AJCC 8th Edition - Clinical stage from 01/18/2022: Stage IB (cT2, cN0, cM0) - Signed by Truitt Merle, MD on 01/28/2022 Stage prefix:  Initial diagnosis Total positive nodes: 0   01/18/2022 Initial Biopsy   FINAL MICROSCOPIC DIAGNOSIS:  A. PANCREAS, HEAD, FINE NEEDLE ASPIRATION:  - Malignant cells present consistent with adenocarcinoma   01/24/2022 Imaging   EXAM: CT CHEST WITHOUT CONTRAST  IMPRESSION: 1. No evidence of metastatic disease in the chest. 2. Atelectasis or consolidation in the right lower lung. 3. Healing right rib fractures. 4. Mass in the head of the pancreas. See previous report of CT abdomen and pelvis 01/16/2022. Interval placement of the biliary stent.   01/28/2022 Initial Diagnosis   Pancreatic cancer ([place])   02/16/2022 - 06/28/2022 Chemotherapy   Patient is on Treatment Plan : PANCREATIC Abraxane / Gemcitabine D1,8 q21d     02/16/2022 Genetic Testing   Negative genetic testing on the CancerNext-Expanded+RNAinsight panel.  The report date is February 16, 2022.  The CancerNext-Expanded gene panel offered by [facility] and includes sequencing and rearrangement analysis for the following 77 genes: AIP, ALK, APC*, ATM*, AXIN2, BAP1, BARD1, BLM, BMPR1A, BRCA1*, BRCA2*, BRIP1*, CDC73, CDH1*, CDK4, CDKN1B, CDKN2A, CHEK2*, CTNNA1, DICER1, FANCC, FH, FLCN, GALNT12, KIF1B, LZTR1, MAX, MEN1, MET, MLH1*, MSH2*, MSH3, MSH6*, MUTYH*, NBN, NF1*, NF2, NTHL1, PALB2*, PHOX2B, PMS2*, POT1, PRKAR1A, PTCH1, PTEN*, RAD51C*, RAD51D*, RB1, RECQL, RET, SDHA, SDHAF2, SDHB, SDHC, SDHD, SMAD4, SMARCA4, SMARCB1, SMARCE1, STK11, SUFU, TMEM127, TP53*, TSC1, TSC2, VHL and XRCC2 (sequencing and deletion/duplication); EGFR, EGLN1, HOXB13, KIT, MITF, PDGFRA, POLD1, and POLE (sequencing only); EPCAM and GREM1 (deletion/duplication only). DNA and RNA analyses performed for * genes.    06/14/2022 - 09/19/2022 Chemotherapy   Patient is on Treatment Plan : PANCREATIC Abraxane D1,8,15 + Gemcitabine D1,8,15 q28d     10/11/2022 -  Chemotherapy   Patient is on Treatment Plan : PANCREAS Liposomal Irinotecan + Leucovorin + 5-FU IVCI q14d         INTERVAL HISTORY:  Miranda Francis is here for a follow up of pancreatic cancer   She was last seen by me on 09/26/2022  She presents to the clinic accompanied by husband.Pt Reports she doesn't feel well, fatigue, stomach pain.Tylenol doesn't help.      All other systems were reviewed with the patient and are negative.  MEDICAL HISTORY:  Past Medical History:  Diagnosis Date   Blood transfusion    Diabetes mellitus    Family history of breast cancer    Family history of colon cancer    Family history of prostate cancer    Heart murmur    Hypertension    pancreatic ca 12/2021    SURGICAL HISTORY: Past Surgical History:  Procedure Laterality Date   BILIARY STENT PLACEMENT  01/18/2022   Procedure: BILIARY STENT PLACEMENT;  Surgeon: Carol Ada, MD;  Location: [place];  Service: Gastroenterology;;   DILATION AND CURETTAGE OF UTERUS     ERCP N/A 01/18/2022   Procedure: ENDOSCOPIC RETROGRADE CHOLANGIOPANCREATOGRAPHY (ERCP);  Surgeon: Carol Ada, MD;  Location: [place];  Service: Gastroenterology;  Laterality: N/A;   ESOPHAGOGASTRODUODENOSCOPY N/A 01/18/2022   Procedure: ESOPHAGOGASTRODUODENOSCOPY (EGD);  Surgeon: Carol Ada, MD;  Location: [place];  Service: Gastroenterology;  Laterality: N/A;   EUS  01/18/2022   Procedure: UPPER ENDOSCOPIC ULTRASOUND (EUS) LINEAR;  Surgeon: Carol Ada, MD;  Location: [place];  Service: Gastroenterology;;   FINE NEEDLE ASPIRATION  01/18/2022   Procedure: FINE NEEDLE ASPIRATION (  FNA) LINEAR;  Surgeon: Carol Ada, MD;  Location: [facility] END[facility]COPY;  Service: Gastroenterology;;   IR IMAGING GUIDED PORT INSERTION  02/15/2022   NO PAST SURGERIES     SPHINCTEROTOMY  01/18/2022   Procedure: Joan Mayans;  Surgeon: Carol Ada, MD;  Location: [place];  Service: Gastroenterology;;    I have reviewed the social history and family history with the patient and they are unchanged from previous note.  ALLERGIES:  is allergic to  irinotecan liposome.  MEDICATIONS:  Current Outpatient Medications  Medication Sig Dispense Refill   acetaminophen (TYLENOL) 500 MG tablet Take 1,000 mg by mouth every 6 (six) hours as needed for moderate pain or headache.     amLODipine (NORVASC) 5 MG tablet Take 5 mg by mouth daily.     Cholecalciferol (QC VITAMIN D3) 50 MCG (2000 UT) TABS Take 2,000 Units by mouth daily.     Continuous Blood Gluc Sensor (FREESTYLE LIBRE 2 SENSOR) MISC USE 1 SENSOR AND CHANGE EVERY 14 DAYS AS DIRECTED.     dexamethasone (DECADRON) 4 MG tablet Take 2 tablets (8 mg total) by mouth daily. Start the day after irinotecan chemotherapy for 2 days. Take with food. 8 tablet 5   gabapentin (NEURONTIN) 100 MG capsule Take 2-3 capsules (200-300 mg total) by mouth at bedtime. 90 capsule 0   KLOR-CON M20 20 MEQ tablet Take 20 mEq by mouth 2 (two) times daily.     lidocaine-prilocaine (EMLA) cream Apply 1 application topically as needed. 30 g 1   losartan (COZAAR) 100 MG tablet Take 100 mg by mouth daily.     NOVOLOG FLEXPEN 100 UNIT/ML FlexPen Inject 15 Units into the skin 2 (two) times daily.     ondansetron (ZOFRAN) 8 MG tablet Take 1 tablet (8 mg total) by mouth every 8 (eight) hours as needed for nausea or vomiting. Start on the third day after irinotecan 30 tablet 1   oxyCODONE (OXY IR/ROXICODONE) 5 MG immediate release tablet Take 1 tablet (5 mg total) by mouth every 3 (three) hours as needed for severe pain. 20 tablet 0   polyethylene glycol (MIRALAX / GLYCOLAX) 17 g packet Take 17 g by mouth daily. 14 each 0   prochlorperazine (COMPAZINE) 10 MG tablet Take 1 tablet (10 mg total) by mouth every 6 (six) hours as needed for nausea or vomiting. 30 tablet 1   zolpidem (AMBIEN) 10 MG tablet Take 10 mg by mouth at bedtime as needed.     No current facility-administered medications for this visit.    PHYSICAL EXAMINATION: ECOG PERFORMANCE STATUS: 2 - Symptomatic, <50% confined to bed  Vitals:   10/11/22 0926  BP:  (!) 163/89  Pulse: 73  Resp: 18  Temp: 98.4 F (36.9 C)  SpO2: 94%   Wt Readings from Last 3 Encounters:  10/11/22 144 lb 14.4 oz (65.7 kg)  09/26/22 148 lb 1.6 oz (67.2 kg)  09/19/22 146 lb 8 oz (66.5 kg)     GENERAL:alert, no distress and comfortable SKIN: skin color normal, no rashes or significant lesions EYES: normal, Conjunctiva are pink and non-injected, sclera clear  NEURO: alert & oriented x 3 with fluent speech  LUNGS: clear to auscultation and percussion with normal breathing effort HEART: regular rate & rhythm and no murmurs and no lower extremity edema  LABORATORY DATA:  I have reviewed the data as listed    Latest Ref Rng & Units 10/11/2022    8:58 AM 09/19/2022   12:31 PM 09/06/2022    1:28  PM  CBC  WBC 4.0 - 10.5 K/uL 6.5  5.3  5.0   Hemoglobin 12.0 - 15.0 g/dL 9.0  8.6  9.1   Hematocrit 36.0 - 46.0 % 27.6  26.0  26.6   Platelets 150 - 400 K/uL 215  262  130         Latest Ref Rng & Units 10/11/2022    8:58 AM 09/19/2022   12:31 PM 09/06/2022    1:28 PM  CMP  Glucose 70 - 99 mg/dL 152  127  142   BUN 8 - 23 mg/dL _0 Creatinine 0.44 - 1.00 mg/dL 0.49  0.51  0.52   Sodium 135 - 145 mmol/L 139  141  137   Potassium 3.5 - 5.1 mmol/L 3.9  3.8  3.8   Chloride 98 - 111 mmol/L 108  109  107   CO2 22 - 32 mmol/L _1 Calcium 8.9 - 10.3 mg/dL 9.3  9.0  8.8   Total Protein 6.5 - 8.1 g/dL 7.2  6.5  6.3   Total Bilirubin 0.3 - 1.2 mg/dL 0.4  0.3  0.3   Alkaline Phos 38 - 126 U/L 183  192  188   AST 15 - 41 U/L _2 ALT 0 - 44 U/L _3 RADIOGRAPHIC STUDIES: I have personally reviewed the radiological images as listed and agreed with the findings in the report. No results found.    No orders of the defined types were placed in this encounter.  All questions were answered. The patient knows to call the clinic with any problems, questions or concerns. No barriers to learning was detected. The total time spent in the  appointment was 30 minutes.     Truitt Merle, MD 10/11/2022   I, Audry Riles, CMA, am acting as scribe for Truitt Merle, MD.   I have reviewed the above documentation for accuracy and completeness, and I agree with the above.

## 2022-10-10 NOTE — Telephone Encounter (Signed)
Per 11/29 in basket/secure chats, pt has been r/s for 11/30. Pt has been called and confirmed and said they will be here at 0830

## 2022-10-11 ENCOUNTER — Encounter: Payer: Self-pay | Admitting: Hematology

## 2022-10-11 ENCOUNTER — Inpatient Hospital Stay: Payer: Medicare HMO

## 2022-10-11 ENCOUNTER — Other Ambulatory Visit: Payer: Self-pay

## 2022-10-11 ENCOUNTER — Ambulatory Visit (HOSPITAL_BASED_OUTPATIENT_CLINIC_OR_DEPARTMENT_OTHER): Payer: Medicare HMO | Admitting: Physician Assistant

## 2022-10-11 ENCOUNTER — Inpatient Hospital Stay (HOSPITAL_BASED_OUTPATIENT_CLINIC_OR_DEPARTMENT_OTHER): Payer: Medicare HMO | Admitting: Hematology

## 2022-10-11 VITALS — BP 163/89 | HR 73 | Temp 98.4°F | Resp 18 | Ht 63.0 in | Wt 144.9 lb

## 2022-10-11 VITALS — BP 136/79 | HR 75 | Temp 98.6°F | Resp 18

## 2022-10-11 DIAGNOSIS — Z5111 Encounter for antineoplastic chemotherapy: Secondary | ICD-10-CM | POA: Diagnosis not present

## 2022-10-11 DIAGNOSIS — C250 Malignant neoplasm of head of pancreas: Secondary | ICD-10-CM

## 2022-10-11 DIAGNOSIS — Z95828 Presence of other vascular implants and grafts: Secondary | ICD-10-CM

## 2022-10-11 LAB — CBC WITH DIFFERENTIAL (CANCER CENTER ONLY)
Abs Immature Granulocytes: 0.03 10*3/uL (ref 0.00–0.07)
Basophils Absolute: 0.1 10*3/uL (ref 0.0–0.1)
Basophils Relative: 1 %
Eosinophils Absolute: 0.1 10*3/uL (ref 0.0–0.5)
Eosinophils Relative: 1 %
HCT: 27.6 % — ABNORMAL LOW (ref 36.0–46.0)
Hemoglobin: 9 g/dL — ABNORMAL LOW (ref 12.0–15.0)
Immature Granulocytes: 1 %
Lymphocytes Relative: 33 %
Lymphs Abs: 2.2 10*3/uL (ref 0.7–4.0)
MCH: 29.5 pg (ref 26.0–34.0)
MCHC: 32.6 g/dL (ref 30.0–36.0)
MCV: 90.5 fL (ref 80.0–100.0)
Monocytes Absolute: 0.5 10*3/uL (ref 0.1–1.0)
Monocytes Relative: 7 %
Neutro Abs: 3.7 10*3/uL (ref 1.7–7.7)
Neutrophils Relative %: 57 %
Platelet Count: 215 10*3/uL (ref 150–400)
RBC: 3.05 MIL/uL — ABNORMAL LOW (ref 3.87–5.11)
RDW: 18.6 % — ABNORMAL HIGH (ref 11.5–15.5)
WBC Count: 6.5 10*3/uL (ref 4.0–10.5)
nRBC: 0 % (ref 0.0–0.2)

## 2022-10-11 LAB — CMP (CANCER CENTER ONLY)
ALT: 8 U/L (ref 0–44)
AST: 13 U/L — ABNORMAL LOW (ref 15–41)
Albumin: 3.6 g/dL (ref 3.5–5.0)
Alkaline Phosphatase: 183 U/L — ABNORMAL HIGH (ref 38–126)
Anion gap: 6 (ref 5–15)
BUN: 13 mg/dL (ref 8–23)
CO2: 25 mmol/L (ref 22–32)
Calcium: 9.3 mg/dL (ref 8.9–10.3)
Chloride: 108 mmol/L (ref 98–111)
Creatinine: 0.49 mg/dL (ref 0.44–1.00)
GFR, Estimated: >60 mL/min (ref >60)
Glucose, Bld: 152 mg/dL — ABNORMAL HIGH (ref 70–99)
Potassium: 3.9 mmol/L (ref 3.5–5.1)
Sodium: 139 mmol/L (ref 135–145)
Total Bilirubin: 0.4 mg/dL (ref 0.3–1.2)
Total Protein: 7.2 g/dL (ref 6.5–8.1)

## 2022-10-11 MED ORDER — SODIUM CHLORIDE 0.9 % IV SOLN
60.0000 mg/m2 | Freq: Once | INTRAVENOUS | Status: AC
  Administered 2022-10-11: 103.2 mg via INTRAVENOUS
  Filled 2022-10-11: qty 24

## 2022-10-11 MED ORDER — PALONOSETRON HCL INJECTION 0.25 MG/5ML
0.2500 mg | Freq: Once | INTRAVENOUS | Status: AC
  Administered 2022-10-11: 0.25 mg via INTRAVENOUS
  Filled 2022-10-11: qty 5

## 2022-10-11 MED ORDER — SODIUM CHLORIDE 0.9% FLUSH: 10.0000 mL | INTRAVENOUS | Status: DC | PRN

## 2022-10-11 MED ORDER — SODIUM CHLORIDE 0.9% FLUSH
10.0000 mL | Freq: Once | INTRAVENOUS | Status: AC
  Administered 2022-10-11: 10 mL

## 2022-10-11 MED ORDER — SODIUM CHLORIDE 0.9 % IV SOLN
2400.0000 mg/m2 | INTRAVENOUS | Status: DC
  Administered 2022-10-11: 4150 mg via INTRAVENOUS
  Filled 2022-10-11: qty 83

## 2022-10-11 MED ORDER — HEPARIN SOD (PORK) LOCK FLUSH 100 UNIT/ML IV SOLN: 500.0000 [IU] | Freq: Once | INTRAVENOUS | Status: DC | PRN

## 2022-10-11 MED ORDER — SODIUM CHLORIDE 0.9 % IV SOLN
400.0000 mg/m2 | Freq: Once | INTRAVENOUS | Status: AC
  Administered 2022-10-11: 692 mg via INTRAVENOUS
  Filled 2022-10-11: qty 34.6

## 2022-10-11 MED ORDER — SODIUM CHLORIDE 0.9 % IV SOLN
10.0000 mg | Freq: Once | INTRAVENOUS | Status: AC
  Administered 2022-10-11: 10 mg via INTRAVENOUS
  Filled 2022-10-11: qty 10

## 2022-10-11 MED ORDER — SODIUM CHLORIDE 0.9 % IV SOLN: Freq: Once | INTRAVENOUS | Status: AC

## 2022-10-11 MED ORDER — ATROPINE SULFATE 1 MG/ML IV SOLN
0.5000 mg | Freq: Once | INTRAVENOUS | Status: AC | PRN
  Administered 2022-10-11: 0.5 mg via INTRAVENOUS
  Filled 2022-10-11: qty 1

## 2022-10-11 NOTE — Progress Notes (Signed)
After patient had the fluids and the IV pepcid- tolerated the liposomal Irinotecan with the restart. Infusion was completed with no further signs of a reaction.  New pump patient- given spill kit and explanation. Confirmed appt. time and discussed Saturday appointment functionality.

## 2022-10-11 NOTE — Progress Notes (Signed)
Hypersensitivity Reaction note  Date of event: 10/11/22 Time of even 12:11pm  Generic name of drug involved: irinotecan liposome Name of provider notified of the hypersensitivity reaction: Truitt Merle  Was agent that likely caused hypersensitivity reaction added to Allergies List within EMR? Yes  Chain of events including reaction signs/symptoms, treatment administered, and outcome (e.g., drug resumed; drug discontinued; sent to Emergency Department; etc.) Started medication at 12:00 pm around 12:11 noticed patient had a grimace on her face. Noted to have lower back pain and Bilateral upper extremity pain. Chemo was paused and fluids given at 12:13, Pepcid 20 mg IVPB given at 1215. Patient stated that the pain has gone away totally around 12:22. 12:33 ok to restart treatment per Dr. Burr Medico. Patient tolerated the rest of her treatment with no issues.   Tildon Husky, RN 10/11/2022 12:24 PM

## 2022-10-11 NOTE — Progress Notes (Signed)
    DATE:  10/11/22                                        X CHEMO/IMMUNOTHERAPY REACTION            MD: Burr Medico   AGENT/BLOOD PRODUCT RECEIVING TODAY:    FOLFIRI with lip-irinotecan    AGENT/BLOOD PRODUCT RECEIVING IMMEDIATELY PRIOR TO REACTION:          Lip-irinotecan   VS: BP:     140/78   P:       80       SPO2:       100                BP:     158/77   P:       81       SPO2:       100     REACTION(S):           back and upper extremity pain   PREMEDS:     Aloxi 0.25 mg IV, Decadron 10 mg IV, Atropine 0.5 mg IV   INTERVENTION: IVF, Pepcid 20 mg IV   Review of Systems  Review of Systems  Musculoskeletal:  Positive for arthralgias and back pain.  All other systems reviewed and are negative.    Physical Exam  Physical Exam Vitals and nursing note reviewed.  Constitutional:      Appearance: She is well-developed. She is not ill-appearing or toxic-appearing.  HENT:     Head: Normocephalic.     Nose: Nose normal.  Eyes:     Conjunctiva/sclera: Conjunctivae normal.  Neck:     Vascular: No JVD.  Cardiovascular:     Rate and Rhythm: Normal rate and regular rhythm.     Pulses: Normal pulses.     Heart sounds: Normal heart sounds.  Pulmonary:     Effort: Pulmonary effort is normal.     Breath sounds: Normal breath sounds.  Abdominal:     General: There is no distension.  Musculoskeletal:     Cervical back: Normal range of motion.     Comments: No cervical, thoracic, or lumbar spinal tenderness to palpation.  No paraspinal tenderness. No step offs, crepitus or deformity palpated.    Skin:    General: Skin is warm and dry.     Findings: No rash.  Neurological:     Mental Status: She is oriented to person, place, and time.     Comments: Strong and equal strength in all extremities. Sensation intact    OUTCOME:             Patient approximately 10 minutes into lipo-irinotecan infusion when she had onset of aching pain in her bilateral upper extremities and lower  back.  She did not have any abdominal pain or cramping.  She was given emergency medications as above and symptoms resolved.  Treatment was restarted and patient tolerated remainder without adverse symptoms.  Discussed with oncologist who will add Pepcid as a premed for future treatments.

## 2022-10-11 NOTE — Patient Instructions (Addendum)
[facility]  Discharge Instructions: Thank you for choosing [facility] to provide your oncology and hematology care.   If you have a lab appointment with the [facility], please go directly to the [facility] and check in at the registration area.   Wear comfortable clothing and clothing appropriate for easy access to any Portacath or PICC line.   We strive to give you quality time with your provider. You may need to reschedule your appointment if you arrive late (15 or more minutes).  Arriving late affects you and other patients whose appointments are after yours.  Also, if you miss three or more appointments without notifying the office, you may be dismissed from the clinic at the provider's discretion.      For prescription refill requests, have your pharmacy contact our office and allow 72 hours for refills to be completed.    Today you received the following chemotherapy and/or immunotherapy agents: Liposomal Irinotecan, 5-FU and leucovorin      To help prevent nausea and vomiting after your treatment, we encourage you to take your nausea medication as directed.  BELOW ARE SYMPTOMS THAT SHOULD BE REPORTED IMMEDIATELY: *FEVER GREATER THAN 100.4 F (38 C) OR HIGHER *CHILLS OR SWEATING *NAUSEA AND VOMITING THAT IS NOT CONTROLLED WITH YOUR NAUSEA MEDICATION *UNUSUAL SHORTNESS OF BREATH *UNUSUAL BRUISING OR BLEEDING *URINARY PROBLEMS (pain or burning when urinating, or frequent urination) *BOWEL PROBLEMS (unusual diarrhea, constipation, pain near the anus) TENDERNESS IN MOUTH AND THROAT WITH OR WITHOUT PRESENCE OF ULCERS (sore throat, sores in mouth, or a toothache) UNUSUAL RASH, SWELLING OR PAIN  UNUSUAL VAGINAL DISCHARGE OR ITCHING   Items with * indicate a potential emergency and should be followed up as soon as possible or go to the Emergency Department if any problems should occur.  Please show the CHEMOTHERAPY ALERT CARD or  IMMUNOTHERAPY ALERT CARD at check-in to the Emergency Department and triage nurse.  Should you have questions after your visit or need to cancel or reschedule your appointment, please contact [facility]  Dept: [phone]  and follow the prompts.  Office hours are 8:00 a.m. to 4:30 p.m. Monday - Friday. Please note that voicemails left after 4:00 p.m. may not be returned until the following business day.  We are closed weekends and major holidays. You have access to a nurse at all times for urgent questions. Please call the main number to the clinic Dept: [phone] and follow the prompts.   For any non-urgent questions, you may also contact your provider using MyChart. We now offer e-Visits for anyone 22 and older to request care online for non-urgent symptoms. For details visit [email or web address].   Also download the MyChart app! Go to the app store, search "MyChart", open the app, select [facility], and log in with your MyChart username and password.  Masks are optional in the cancer centers. If you would like for your care team to wear a mask while they are taking care of you, please let them know. You may have one support person who is at least 75 years old accompany you for your appointments. Irinotecan Liposome Injection What is this medication? IRINOTECAN LIPOSOME (eye ri noe TEE kan LIP oh som) treats pancreatic cancer. It works by slowing down the growth of cancer cells. This medicine may be used for other purposes; ask your health care provider or pharmacist if you have questions. COMMON BRAND NAME(S): ONIVYDE What should I tell my  care team before I take this medication? They need to know if you have any of these conditions: Blockage in your bowels Dehydration Infection Low white blood cell levels Lung disease An unusual or allergic reaction to irinotecan liposome, irinotecan, other medications, foods, dyes, or preservatives Pregnant or  trying to get pregnant Breast-feeding How should I use this medication? This medication is injected into a vein. It is given by your care team in a hospital or clinic setting. Talk to your care team about the use of this medication in children. Special care may be needed. Overdosage: If you think you have taken too much of this medicine contact a poison control center or emergency room at once. NOTE: This medicine is only for you. Do not share this medicine with others. What if I miss a dose? Keep appointments for follow-up doses. It is important not to miss your dose. Call your care team if you are unable to keep an appointment. What may interact with this medication? Do not take this medication with any of the following: Itraconazole This medication may also interact with the following: Certain antivirals for HIV or AIDS Certain medications for seizures, such as carbamazepine, fosphenytoin, phenytoin, phenobarbital Clarithromycin Gemfibrozil Nefazodone Rifabutin Rifampin Rifapentine St. John's Wort Voriconazole This list may not describe all possible interactions. Give your health care provider a list of all the medicines, herbs, non-prescription drugs, or dietary supplements you use. Also tell them if you smoke, drink alcohol, or use illegal drugs. Some items may interact with your medicine. What should I watch for while using this medication? This medication may make you feel generally unwell. This is not uncommon as chemotherapy can affect healthy cells as well as cancer cells. Report any side effects. Continue your course of treatment even though you feel ill unless your care team tells you to stop. You may need blood work while you are taking this medication. This medication can cause serious side effects and allergic reactions. To reduce your risk, your care team may give you other medications to take before receiving this one. Be sure to follow the directions from your care  team. Check with your care team if you get an attack of diarrhea, nausea and vomiting, or if you sweat a lot. The loss of too much body fluid can make it dangerous for you to take this medication. This medication may cause infertility. Talk to your care team if you are concerned about your fertility. Talk to your care team if you wish to become pregnant or if you think you might be pregnant. This medication can cause serious birth defects if taken during pregnancy or if you get pregnant within 7 months after stopping therapy. A negative pregnancy test is required before starting this medication. A reliable form of contraception is recommended while taking this medication and for 7 months after stopping it. Talk to your care team about reliable forms of contraception. Use a condom during sex and for 4 months after stopping therapy. Tell your care team right away if you think your partner might be pregnant. This medication can cause serious birth defects. Do not breast-feed while taking this medication and for 1 month after stopping therapy. This medication may increase your risk of getting an infection. Call your care team for advice if you get a fever, chills, sore throat, or other symptoms of a cold or flu. Do not treat yourself. Try to avoid being around people who are sick. Avoid taking medications that contain aspirin, acetaminophen, ibuprofen,  naproxen, or ketoprofen unless instructed by your care team. These medications may hide a fever. Be careful brushing or flossing your teeth or using a toothpick because you may get an infection or bleed more easily. If you have any dental work done, tell your dentist you are receiving this medication. What side effects may I notice from receiving this medication? Side effects that you should report to your care team as soon as possible: Allergic reactions or angioedema--skin rash, itching or hives, swelling of the face, eyes, lips, tongue, arms, or legs,  trouble swallowing or breathing Dry cough, shortness of breath or trouble breathing Diarrhea Infection--fever, chills, cough, or sore throat Side effects that usually do not require medical attention (report to your care team if they continue or are bothersome): Fatigue Loss of appetite Nausea Pain, redness, or swelling with sores inside the mouth or throat Vomiting This list may not describe all possible side effects. Call your doctor for medical advice about side effects. You may report side effects to FDA at 1-800-FDA-1088. Where should I keep my medication? This medication is given in a hospital or clinic. It will not be stored at home. NOTE: This sheet is a summary. It may not cover all possible information. If you have questions about this medicine, talk to your doctor, pharmacist, or health care provider.  2023 Elsevier/Gold Standard (2021-12-28 00:00:00) Fluorouracil Injection What is this medication? FLUOROURACIL (flure oh YOOR a sil) treats some types of cancer. It works by slowing down the growth of cancer cells. This medicine may be used for other purposes; ask your health care provider or pharmacist if you have questions. COMMON BRAND NAME(S): Adrucil What should I tell my care team before I take this medication? They need to know if you have any of these conditions: Blood disorders Dihydropyrimidine dehydrogenase (DPD) deficiency Infection, such as chickenpox, cold sores, herpes Kidney disease Liver disease Poor nutrition Recent or ongoing radiation therapy An unusual or allergic reaction to fluorouracil, other medications, foods, dyes, or preservatives If you or your partner are pregnant or trying to get pregnant Breast-feeding How should I use this medication? This medication is injected into a vein. It is administered by your care team in a hospital or clinic setting. Talk to your care team about the use of this medication in children. Special care may be  needed. Overdosage: If you think you have taken too much of this medicine contact a poison control center or emergency room at once. NOTE: This medicine is only for you. Do not share this medicine with others. What if I miss a dose? Keep appointments for follow-up doses. It is important not to miss your dose. Call your care team if you are unable to keep an appointment. What may interact with this medication? Do not take this medication with any of the following: Live virus vaccines This medication may also interact with the following: Medications that treat or prevent blood clots, such as warfarin, enoxaparin, dalteparin This list may not describe all possible interactions. Give your health care provider a list of all the medicines, herbs, non-prescription drugs, or dietary supplements you use. Also tell them if you smoke, drink alcohol, or use illegal drugs. Some items may interact with your medicine. What should I watch for while using this medication? Your condition will be monitored carefully while you are receiving this medication. This medication may make you feel generally unwell. This is not uncommon as chemotherapy can affect healthy cells as well as cancer cells. Report any  side effects. Continue your course of treatment even though you feel ill unless your care team tells you to stop. In some cases, you may be given additional medications to help with side effects. Follow all directions for their use. This medication may increase your risk of getting an infection. Call your care team for advice if you get a fever, chills, sore throat, or other symptoms of a cold or flu. Do not treat yourself. Try to avoid being around people who are sick. This medication may increase your risk to bruise or bleed. Call your care team if you notice any unusual bleeding. Be careful brushing or flossing your teeth or using a toothpick because you may get an infection or bleed more easily. If you have any  dental work done, tell your dentist you are receiving this medication. Avoid taking medications that contain aspirin, acetaminophen, ibuprofen, naproxen, or ketoprofen unless instructed by your care team. These medications may hide a fever. Do not treat diarrhea with over the counter products. Contact your care team if you have diarrhea that lasts more than 2 days or if it is severe and watery. This medication can make you more sensitive to the sun. Keep out of the sun. If you cannot avoid being in the sun, wear protective clothing and sunscreen. Do not use sun lamps, tanning beds, or tanning booths. Talk to your care team if you or your partner wish to become pregnant or think you might be pregnant. This medication can cause serious birth defects if taken during pregnancy and for 3 months after the last dose. A reliable form of contraception is recommended while taking this medication and for 3 months after the last dose. Talk to your care team about effective forms of contraception. Do not father a child while taking this medication and for 3 months after the last dose. Use a condom while having sex during this time period. Do not breastfeed while taking this medication. This medication may cause infertility. Talk to your care team if you are concerned about your fertility. What side effects may I notice from receiving this medication? Side effects that you should report to your care team as soon as possible: Allergic reactions--skin rash, itching, hives, swelling of the face, lips, tongue, or throat Heart attack--pain or tightness in the chest, shoulders, arms, or jaw, nausea, shortness of breath, cold or clammy skin, feeling faint or lightheaded Heart failure--shortness of breath, swelling of the ankles, feet, or hands, sudden weight gain, unusual weakness or fatigue Heart rhythm changes--fast or irregular heartbeat, dizziness, feeling faint or lightheaded, chest pain, trouble breathing High ammonia  level--unusual weakness or fatigue, confusion, loss of appetite, nausea, vomiting, seizures Infection--fever, chills, cough, sore throat, wounds that don't heal, pain or trouble when passing urine, general feeling of discomfort or being unwell Low red blood cell level--unusual weakness or fatigue, dizziness, headache, trouble breathing Pain, tingling, or numbness in the hands or feet, muscle weakness, change in vision, confusion or trouble speaking, loss of balance or coordination, trouble walking, seizures Redness, swelling, and blistering of the skin over hands and feet Severe or prolonged diarrhea Unusual bruising or bleeding Side effects that usually do not require medical attention (report to your care team if they continue or are bothersome): Dry skin Headache Increased tears Nausea Pain, redness, or swelling with sores inside the mouth or throat Sensitivity to light Vomiting This list may not describe all possible side effects. Call your doctor for medical advice about side effects. You may report  side effects to FDA at 1-800-FDA-1088. Where should I keep my medication? This medication is given in a hospital or clinic. It will not be stored at home. NOTE: This sheet is a summary. It may not cover all possible information. If you have questions about this medicine, talk to your doctor, pharmacist, or health care provider.  2023 Elsevier/Gold Standard (2022-02-27 00:00:00) Leucovorin Injection What is this medication? LEUCOVORIN (loo koe VOR in) prevents side effects from certain medications, such as methotrexate. It works by increasing folate levels. This helps protect healthy cells in your body. It may also be used to treat anemia caused by low levels of folate. It can also be used with fluorouracil, a type of chemotherapy, to treat colorectal cancer. It works by increasing the effects of fluorouracil in the body. This medicine may be used for other purposes; ask your health care  provider or pharmacist if you have questions. What should I tell my care team before I take this medication? They need to know if you have any of these conditions: Anemia from low levels of vitamin B12 in the blood An unusual or allergic reaction to leucovorin, folic acid, other medications, foods, dyes, or preservatives Pregnant or trying to get pregnant Breastfeeding How should I use this medication? This medication is injected into a vein or a muscle. It is given by your care team in a hospital or clinic setting. Talk to your care team about the use of this medication in children. Special care may be needed. Overdosage: If you think you have taken too much of this medicine contact a poison control center or emergency room at once. NOTE: This medicine is only for you. Do not share this medicine with others. What if I miss a dose? Keep appointments for follow-up doses. It is important not to miss your dose. Call your care team if you are unable to keep an appointment. What may interact with this medication? Capecitabine Fluorouracil Phenobarbital Phenytoin Primidone Trimethoprim;sulfamethoxazole This list may not describe all possible interactions. Give your health care provider a list of all the medicines, herbs, non-prescription drugs, or dietary supplements you use. Also tell them if you smoke, drink alcohol, or use illegal drugs. Some items may interact with your medicine. What should I watch for while using this medication? Your condition will be monitored carefully while you are receiving this medication. This medication may increase the side effects of 5-fluorouracil. Tell your care team if you have diarrhea or mouth sores that do not get better or that get worse. What side effects may I notice from receiving this medication? Side effects that you should report to your care team as soon as possible: Allergic reactions--skin rash, itching, hives, swelling of the face, lips, tongue,  or throat This list may not describe all possible side effects. Call your doctor for medical advice about side effects. You may report side effects to FDA at 1-800-FDA-1088. Where should I keep my medication? This medication is given in a hospital or clinic. It will not be stored at home. NOTE: This sheet is a summary. It may not cover all possible information. If you have questions about this medicine, talk to your doctor, pharmacist, or health care provider.  2023 Elsevier/Gold Standard (2022-04-03 00:00:00)

## 2022-10-12 ENCOUNTER — Other Ambulatory Visit: Payer: Medicare HMO

## 2022-10-12 ENCOUNTER — Ambulatory Visit: Payer: Medicare HMO | Admitting: Hematology

## 2022-10-12 ENCOUNTER — Ambulatory Visit: Payer: Medicare HMO

## 2022-10-13 ENCOUNTER — Telehealth: Payer: Self-pay

## 2022-10-13 ENCOUNTER — Inpatient Hospital Stay: Payer: Medicare HMO | Attending: Physician Assistant

## 2022-10-13 VITALS — BP 154/77 | HR 61 | Temp 97.5°F | Resp 17

## 2022-10-13 DIAGNOSIS — Z79899 Other long term (current) drug therapy: Secondary | ICD-10-CM | POA: Insufficient documentation

## 2022-10-13 DIAGNOSIS — C250 Malignant neoplasm of head of pancreas: Secondary | ICD-10-CM | POA: Diagnosis present

## 2022-10-13 DIAGNOSIS — Z5111 Encounter for antineoplastic chemotherapy: Secondary | ICD-10-CM | POA: Diagnosis present

## 2022-10-13 MED ORDER — SODIUM CHLORIDE 0.9% FLUSH
10.0000 mL | INTRAVENOUS | Status: DC | PRN
  Administered 2022-10-13: 10 mL

## 2022-10-13 MED ORDER — HEPARIN SOD (PORK) LOCK FLUSH 100 UNIT/ML IV SOLN
500.0000 [IU] | Freq: Once | INTRAVENOUS | Status: AC | PRN
  Administered 2022-10-13: 500 [IU]

## 2022-10-13 NOTE — Patient Instructions (Signed)

## 2022-10-13 NOTE — Telephone Encounter (Signed)
Faxed [phone] over  a [employer] letter to [employer].

## 2022-10-15 ENCOUNTER — Ambulatory Visit: Payer: Medicare HMO | Admitting: Hematology

## 2022-10-15 ENCOUNTER — Ambulatory Visit: Payer: Medicare HMO

## 2022-10-15 ENCOUNTER — Other Ambulatory Visit: Payer: Medicare HMO

## 2022-10-18 ENCOUNTER — Other Ambulatory Visit: Payer: Medicare HMO

## 2022-10-18 ENCOUNTER — Ambulatory Visit: Payer: Medicare HMO

## 2022-10-18 ENCOUNTER — Ambulatory Visit: Payer: Medicare HMO | Admitting: Hematology

## 2022-10-18 LAB — CANCER ANTIGEN 19-9: CA 19-9: 23020 U/mL — ABNORMAL HIGH (ref 0–35)

## 2022-10-22 NOTE — Progress Notes (Unsigned)
[place]   [phone] [phone]   Clinic Follow up Note   Patient Care Team: [name], MD as PCP - General (Internal Medicine) [name], MD as PCP - Cardiology (Cardiology) Truitt Merle, MD as Consulting Physician (Oncology)  Date of Service:  10/24/2022  CHIEF COMPLAINT: f/u of pancreatic cancer    CURRENT THERAPY:  Pending second-line FOLFIRI    ASSESSMENT:  Miranda Francis is a 76 y.o. female with   Pancreatic cancer ([place]) stage IV c(T2, N0, M1) with liver metastasis, MMR normal   -Diagnosed in 01/2022. presented to PCP with jaundice for one month. Also complained of epigastric pain, poor appetite, weight loss, and light-colored stool.  -she began neoadjuvant gemcitabine alone on 02/16/22, abraxane was added with C2 (03/08/22).   -abdomen MRI on 06/19/22 showed: subtle liver lesions are stable from MRI in 01/2022 (not reported then); no change in pancreatic mass.  -Dr. Zenia Resides feels she is not a good surgical candidate due to he indeterminate liver lesions and high CA19.9 over one thousand. -restaging CT CAP on 09/24/22 showed: enlargement of two liver lesions; stable pancreatic head mass; no adenopathy.  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 She has diabetes also  -overall mild and stable     PLAN: -lab reviewed -Proceed with treatment second line liposomal  irinotecan and -5-FU Cycle 2, with same reduce liposomal irinotecan  -lab,flush,f/u and FOLFIRI with lip-irinotecan in 2 and 4 weeks  -After Cycle 5 repeat CT Scan   SUMMARY OF ONCOLOGIC HISTORY: Oncology History Overview Note   Cancer Staging  Pancreatic cancer [facility]) Staging form: Exocrine Pancreas, AJCC 8th Edition - Clinical stage from 01/18/2022: Stage IB (cT2, cN0, cM0) - Signed by Truitt Merle, MD on 01/28/2022    Pancreatic cancer ([place])   01/16/2022 Imaging   CLINICAL DATA:  Jaundice   EXAM: CT ABDOMEN AND PELVIS WITH CONTRAST  IMPRESSION: 1. Marked intra and extrahepatic biliary dilatation with marked dilatation of pancreatic duct and distended gallbladder. There is significant atrophy of the body and tail of pancreas. 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 2. Diverticular disease of the colon without acute wall thickening 3. Mild endometrial thickening up to 9 mm, recommend nonemergent pelvic ultrasound for further evaluation   01/17/2022 Imaging   EXAM: MRI ABDOMEN WITHOUT AND WITH CONTRAST (INCLUDING MRCP)  IMPRESSION: 1. Obstructing mass within the pancreatic head with an intraluminal component demonstrating low level enhancement. 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 2. This mass causes significant extrinsic narrowing at the portal/superior mesenteric vein confluence as well as marked intra and extrahepatic biliary dilatation. 3. No distant metastases identified. 4. Endoscopy for endoscopic ultrasound, tissue sampling and biliary decompression recommended.   01/18/2022 Cancer Staging   Staging form: Exocrine Pancreas, AJCC 8th Edition - Clinical stage from 01/18/2022: Stage IB (cT2, cN0, cM0) - Signed by Truitt Merle, MD on 01/28/2022 Stage prefix: Initial diagnosis Total positive nodes: 0   01/18/2022 Initial Biopsy   FINAL MICROSCOPIC DIAGNOSIS:  A. PANCREAS, HEAD, FINE NEEDLE ASPIRATION:  - Malignant cells present consistent with adenocarcinoma   01/24/2022 Imaging   EXAM: CT CHEST WITHOUT  CONTRAST  IMPRESSION: 1. No evidence of metastatic disease in the chest. 2. Atelectasis or consolidation in  the right lower lung. 3. Healing right rib fractures. 4. Mass in the head of the pancreas. See previous report of CT abdomen and pelvis 01/16/2022. Interval placement of the biliary stent.   01/28/2022 Initial Diagnosis   Pancreatic cancer ([place])   02/16/2022 - 06/28/2022 Chemotherapy   Patient is on Treatment Plan : PANCREATIC Abraxane / Gemcitabine D1,8 q21d     02/16/2022 Genetic Testing   Negative genetic testing on the CancerNext-Expanded+RNAinsight panel.  The report date is February 16, 2022.  The CancerNext-Expanded gene panel offered by [facility] and includes sequencing and rearrangement analysis for the following 77 genes: AIP, ALK, APC*, ATM*, AXIN2, BAP1, BARD1, BLM, BMPR1A, BRCA1*, BRCA2*, BRIP1*, CDC73, CDH1*, CDK4, CDKN1B, CDKN2A, CHEK2*, CTNNA1, DICER1, FANCC, FH, FLCN, GALNT12, KIF1B, LZTR1, MAX, MEN1, MET, MLH1*, MSH2*, MSH3, MSH6*, MUTYH*, NBN, NF1*, NF2, NTHL1, PALB2*, PHOX2B, PMS2*, POT1, PRKAR1A, PTCH1, PTEN*, RAD51C*, RAD51D*, RB1, RECQL, RET, SDHA, SDHAF2, SDHB, SDHC, SDHD, SMAD4, SMARCA4, SMARCB1, SMARCE1, STK11, SUFU, TMEM127, TP53*, TSC1, TSC2, VHL and XRCC2 (sequencing and deletion/duplication); EGFR, EGLN1, HOXB13, KIT, MITF, PDGFRA, POLD1, and POLE (sequencing only); EPCAM and GREM1 (deletion/duplication only). DNA and RNA analyses performed for * genes.    06/14/2022 - 09/19/2022 Chemotherapy   Patient is on Treatment Plan : PANCREATIC Abraxane D1,8,15 + Gemcitabine D1,8,15 q28d     10/11/2022 -  Chemotherapy   Patient is on Treatment Plan : PANCREAS Liposomal Irinotecan + Leucovorin + 5-FU IVCI q14d        INTERVAL HISTORY:  Miranda Francis is here for a follow up of  pancreatic cancer  She was last seen by me on 10/11/2022 She presents to the clinic accompanied by Husband.Pt states she had a reaction to the last treatment  far as bad cramping pain. She states when she had the treatment at home she did real good, but after getting the pump d/c she had diarrhea and  nausea last for two days. Everything been back to normal . Her appetite has been good for the last two days.    All other systems were reviewed with the patient and are negative.  MEDICAL HISTORY:  Past Medical History:  Diagnosis Date   Blood transfusion    Diabetes mellitus    Family history of breast cancer    Family history of colon cancer    Family history of prostate cancer    Heart murmur    Hypertension    pancreatic ca 12/2021    SURGICAL HISTORY: Past Surgical History:  Procedure Laterality Date   BILIARY STENT PLACEMENT  01/18/2022   Procedure: BILIARY STENT PLACEMENT;  Surgeon: Carol Ada, MD;  Location: [place];  Service: Gastroenterology;;   DILATION AND CURETTAGE OF UTERUS     ERCP N/A 01/18/2022   Procedure: ENDOSCOPIC RETROGRADE CHOLANGIOPANCREATOGRAPHY (ERCP);  Surgeon: Carol Ada, MD;  Location: [place];  Service: Gastroenterology;  Laterality: N/A;   ESOPHAGOGASTRODUODENOSCOPY N/A 01/18/2022   Procedure: ESOPHAGOGASTRODUODENOSCOPY (EGD);  Surgeon: Carol Ada, MD;  Location: [place];  Service: Gastroenterology;  Laterality: N/A;   EUS  01/18/2022   Procedure: UPPER ENDOSCOPIC ULTRASOUND (EUS) LINEAR;  Surgeon: Carol Ada, MD;  Location: [facility] ENDOSCOPY;  Service: Gastroenterology;;   FINE NEEDLE ASPIRATION  01/18/2022   Procedure: FINE NEEDLE ASPIRATION (FNA) LINEAR;  Surgeon: Carol Ada, MD;  Location: [place];  Service: Gastroenterology;;   IR IMAGING GUIDED PORT INSERTION  02/15/2022   NO PAST  SURGERIES     SPHINCTEROTOMY  01/18/2022   Procedure: SPHINCTEROTOMY;  Surgeon: Carol Ada, MD;  Location: [place];  Service: Gastroenterology;;    I have reviewed the social history and family history with the patient and they are unchanged from previous note.  ALLERGIES:  is allergic to irinotecan liposome.  MEDICATIONS:  Current Outpatient Medications  Medication Sig Dispense Refill   acetaminophen (TYLENOL) 500 MG tablet Take 1,000  mg by mouth every 6 (six) hours as needed for moderate pain or headache.     amLODipine (NORVASC) 5 MG tablet Take 5 mg by mouth daily.     Cholecalciferol (QC VITAMIN D3) 50 MCG (2000 UT) TABS Take 2,000 Units by mouth daily.     Continuous Blood Gluc Sensor (FREESTYLE LIBRE 2 SENSOR) MISC USE 1 SENSOR AND CHANGE EVERY 14 DAYS AS DIRECTED.     dexamethasone (DECADRON) 4 MG tablet Take 2 tablets (8 mg total) by mouth daily. Start the day after irinotecan chemotherapy for 2 days. Take with food. 8 tablet 5   gabapentin (NEURONTIN) 100 MG capsule Take 2-3 capsules (200-300 mg total) by mouth at bedtime. 90 capsule 0   KLOR-CON M20 20 MEQ tablet Take 20 mEq by mouth 2 (two) times daily.     lidocaine-prilocaine (EMLA) cream Apply 1 application topically as needed. 30 g 1   losartan (COZAAR) 100 MG tablet Take 100 mg by mouth daily.     NOVOLOG FLEXPEN 100 UNIT/ML FlexPen Inject 15 Units into the skin 2 (two) times daily.     ondansetron (ZOFRAN) 8 MG tablet Take 1 tablet (8 mg total) by mouth every 8 (eight) hours as needed for nausea or vomiting. Start on the third day after irinotecan 30 tablet 1   oxyCODONE (OXY IR/ROXICODONE) 5 MG immediate release tablet Take 1 tablet (5 mg total) by mouth every 3 (three) hours as needed for severe pain. 20 tablet 0   polyethylene glycol (MIRALAX / GLYCOLAX) 17 g packet Take 17 g by mouth daily. 14 each 0   prochlorperazine (COMPAZINE) 10 MG tablet Take 1 tablet (10 mg total) by mouth every 6 (six) hours as needed for nausea or vomiting. 30 tablet 1   zolpidem (AMBIEN) 10 MG tablet Take 10 mg by mouth at bedtime as needed.     No current facility-administered medications for this visit.   Facility-Administered Medications Ordered in Other Visits  Medication Dose Route Frequency Provider Last Rate Last Admin   atropine injection 0.4 mg  0.4 mg Intravenous Once Truitt Merle, MD       dexamethasone (DECADRON) 10 mg in sodium chloride 0.9 % 50 mL IVPB  10 mg  Intravenous Once Truitt Merle, MD       famotidine (PEPCID) IVPB 20 mg premix  20 mg Intravenous Once Truitt Merle, MD       fluorouracil (ADRUCIL) 4,150 mg in sodium chloride 0.9 % 67 mL chemo infusion  2,400 mg/m2 (Treatment Plan Recorded) Intravenous 1 day or 1 dose Truitt Merle, MD       irinotecan LIPOSOME (ONIVYDE) 103.2 mg in sodium chloride 0.9 % 500 mL chemo infusion  60 mg/m2 (Treatment Plan Recorded) Intravenous Once Truitt Merle, MD       leucovorin 692 mg in sodium chloride 0.9 % 250 mL infusion  400 mg/m2 (Treatment Plan Recorded) Intravenous Once Truitt Merle, MD        PHYSICAL EXAMINATION: ECOG PERFORMANCE STATUS: 1 - Symptomatic but completely ambulatory  Vitals:   10/24/22 0930  BP: (!) 156/79  Pulse: (!) 57  Resp: 18  Temp: 98.4 F (36.9 C)  SpO2: 100%   Wt Readings from Last 3 Encounters:  10/24/22 143 lb 6.4 oz (65 kg)  10/11/22 144 lb 14.4 oz (65.7 kg)  09/26/22 148 lb 1.6 oz (67.2 kg)     GENERAL:alert, no distress and comfortable SKIN: skin color normal, no rashes or significant lesions EYES: normal, Conjunctiva are pink and non-injected, sclera clear  NEURO: alert & oriented x 3 with fluent speech  LABORATORY DATA:  I have reviewed the data as listed    Latest Ref Rng & Units 10/24/2022    9:07 AM 10/11/2022    8:58 AM 09/19/2022   12:31 PM  CBC  WBC 4.0 - 10.5 K/uL 7.2  6.5  5.3   Hemoglobin 12.0 - 15.0 g/dL 8.9  9.0  8.6   Hematocrit 36.0 - 46.0 % 27.4  27.6  26.0   Platelets 150 - 400 K/uL 176  215  262         Latest Ref Rng & Units 10/24/2022    9:07 AM 10/11/2022    8:58 AM 09/19/2022   12:31 PM  CMP  Glucose 70 - 99 mg/dL 180  152  127   BUN 8 - 23 mg/dL _0 Creatinine 0.44 - 1.00 mg/dL 0.57  0.49  0.51   Sodium 135 - 145 mmol/L 140  139  141   Potassium 3.5 - 5.1 mmol/L 4.1  3.9  3.8   Chloride 98 - 111 mmol/L 109  108  109   CO2 22 - 32 mmol/L _1 Calcium 8.9 - 10.3 mg/dL 9.3  9.3  9.0   Total Protein 6.5 - 8.1 g/dL 6.3   7.2  6.5   Total Bilirubin 0.3 - 1.2 mg/dL 0.3  0.4  0.3   Alkaline Phos 38 - 126 U/L 190  183  192   AST 15 - 41 U/L _2 ALT 0 - 44 U/L _3 RADIOGRAPHIC STUDIES: I have personally reviewed the radiological images as listed and agreed with the findings in the report. No results found.    Orders Placed This Encounter  Procedures   CBC with Differential ([place] Only)    Standing Status:   Future    Standing Expiration Date:   11/08/2023   CMP ([place] only)    Standing Status:   Future    Standing Expiration Date:   11/08/2023   CBC with Differential ([place] Only)    Standing Status:   Future    Standing Expiration Date:   11/22/2023   CMP ([place] only)    Standing Status:   Future    Standing Expiration Date:   11/22/2023   All questions were answered. The patient knows to call the clinic with any problems, questions or concerns. No barriers to learning was detected. The total time spent in the appointment was 30 minutes.     Truitt Merle, MD 10/24/2022   Felicity Coyer, CMA, am acting as scribe for Truitt Merle, MD.   I have reviewed the above documentation for accuracy and completeness, and I agree with the above.

## 2022-10-23 DIAGNOSIS — G620 Drug-induced polyneuropathy: Secondary | ICD-10-CM | POA: Insufficient documentation

## 2022-10-23 NOTE — Assessment & Plan Note (Signed)
stage IV c(T2, N0, M1) with liver metastasis, MMR normal   -Diagnosed in 01/2022. presented to PCP with jaundice for one month. Also complained of epigastric pain, poor appetite, weight loss, and light-colored stool.  -she began neoadjuvant gemcitabine alone on 02/16/22, abraxane was added with C2 (03/08/22).   -abdomen MRI on 06/19/22 showed: subtle liver lesions are stable from MRI in 01/2022 (not reported then); no change in pancreatic mass.  -Dr. Zenia Resides feels she is not a good surgical candidate due to he indeterminate liver lesions and high CA19.9 over one thousand. -restaging CT CAP on 09/24/22 showed: enlargement of two liver lesions; stable pancreatic head mass; no adenopathy.  -I changed her treatment to second line FOLFIRI (5FU pump / liposomal irinotecan) on 10/09/22

## 2022-10-23 NOTE — Assessment & Plan Note (Signed)
-  secondary to Abraxane. She has diabetes also  -overall mild and stable

## 2022-10-24 ENCOUNTER — Telehealth: Payer: Self-pay

## 2022-10-24 ENCOUNTER — Inpatient Hospital Stay: Payer: Medicare HMO

## 2022-10-24 ENCOUNTER — Inpatient Hospital Stay (HOSPITAL_BASED_OUTPATIENT_CLINIC_OR_DEPARTMENT_OTHER): Payer: Medicare HMO | Admitting: Hematology

## 2022-10-24 ENCOUNTER — Encounter: Payer: Self-pay | Admitting: Hematology

## 2022-10-24 VITALS — BP 156/79 | HR 57 | Temp 98.4°F | Resp 18 | Ht 63.0 in | Wt 143.4 lb

## 2022-10-24 DIAGNOSIS — G620 Drug-induced polyneuropathy: Secondary | ICD-10-CM | POA: Diagnosis not present

## 2022-10-24 DIAGNOSIS — C250 Malignant neoplasm of head of pancreas: Secondary | ICD-10-CM

## 2022-10-24 DIAGNOSIS — Z5111 Encounter for antineoplastic chemotherapy: Secondary | ICD-10-CM | POA: Diagnosis not present

## 2022-10-24 DIAGNOSIS — Z95828 Presence of other vascular implants and grafts: Secondary | ICD-10-CM

## 2022-10-24 LAB — CMP (CANCER CENTER ONLY)
ALT: 11 U/L (ref 0–44)
AST: 16 U/L (ref 15–41)
Albumin: 3.4 g/dL — ABNORMAL LOW (ref 3.5–5.0)
Alkaline Phosphatase: 190 U/L — ABNORMAL HIGH (ref 38–126)
Anion gap: 4 — ABNORMAL LOW (ref 5–15)
BUN: 15 mg/dL (ref 8–23)
CO2: 27 mmol/L (ref 22–32)
Calcium: 9.3 mg/dL (ref 8.9–10.3)
Chloride: 109 mmol/L (ref 98–111)
Creatinine: 0.57 mg/dL (ref 0.44–1.00)
GFR, Estimated: >60 mL/min (ref >60)
Glucose, Bld: 180 mg/dL — ABNORMAL HIGH (ref 70–99)
Potassium: 4.1 mmol/L (ref 3.5–5.1)
Sodium: 140 mmol/L (ref 135–145)
Total Bilirubin: 0.3 mg/dL (ref 0.3–1.2)
Total Protein: 6.3 g/dL — ABNORMAL LOW (ref 6.5–8.1)

## 2022-10-24 LAB — CBC WITH DIFFERENTIAL (CANCER CENTER ONLY)
Abs Immature Granulocytes: 0.01 10*3/uL (ref 0.00–0.07)
Basophils Absolute: 0.1 10*3/uL (ref 0.0–0.1)
Basophils Relative: 1 %
Eosinophils Absolute: 0.3 10*3/uL (ref 0.0–0.5)
Eosinophils Relative: 5 %
HCT: 27.4 % — ABNORMAL LOW (ref 36.0–46.0)
Hemoglobin: 8.9 g/dL — ABNORMAL LOW (ref 12.0–15.0)
Immature Granulocytes: 0 %
Lymphocytes Relative: 32 %
Lymphs Abs: 2.3 10*3/uL (ref 0.7–4.0)
MCH: 29.2 pg (ref 26.0–34.0)
MCHC: 32.5 g/dL (ref 30.0–36.0)
MCV: 89.8 fL (ref 80.0–100.0)
Monocytes Absolute: 0.4 10*3/uL (ref 0.1–1.0)
Monocytes Relative: 5 %
Neutro Abs: 4.1 10*3/uL (ref 1.7–7.7)
Neutrophils Relative %: 57 %
Platelet Count: 176 10*3/uL (ref 150–400)
RBC: 3.05 MIL/uL — ABNORMAL LOW (ref 3.87–5.11)
RDW: 19.2 % — ABNORMAL HIGH (ref 11.5–15.5)
WBC Count: 7.2 10*3/uL (ref 4.0–10.5)
nRBC: 0 % (ref 0.0–0.2)

## 2022-10-24 MED ORDER — FAMOTIDINE IN NACL 20-0.9 MG/50ML-% IV SOLN
20.0000 mg | Freq: Once | INTRAVENOUS | Status: AC
  Administered 2022-10-24: 20 mg via INTRAVENOUS
  Filled 2022-10-24: qty 50

## 2022-10-24 MED ORDER — ATROPINE SULFATE 1 MG/ML IV SOLN
0.4000 mg | Freq: Once | INTRAVENOUS | Status: AC
  Administered 2022-10-24: 0.4 mg via INTRAVENOUS
  Filled 2022-10-24: qty 1

## 2022-10-24 MED ORDER — SODIUM CHLORIDE 0.9 % IV SOLN: Freq: Once | INTRAVENOUS | Status: AC

## 2022-10-24 MED ORDER — SODIUM CHLORIDE 0.9% FLUSH
10.0000 mL | Freq: Once | INTRAVENOUS | Status: AC
  Administered 2022-10-24: 10 mL

## 2022-10-24 MED ORDER — SODIUM CHLORIDE 0.9 % IV SOLN
10.0000 mg | Freq: Once | INTRAVENOUS | Status: AC
  Administered 2022-10-24: 10 mg via INTRAVENOUS
  Filled 2022-10-24: qty 10

## 2022-10-24 MED ORDER — PALONOSETRON HCL INJECTION 0.25 MG/5ML
0.2500 mg | Freq: Once | INTRAVENOUS | Status: AC
  Administered 2022-10-24: 0.25 mg via INTRAVENOUS
  Filled 2022-10-24: qty 5

## 2022-10-24 MED ORDER — SODIUM CHLORIDE 0.9 % IV SOLN
400.0000 mg/m2 | Freq: Once | INTRAVENOUS | Status: AC
  Administered 2022-10-24: 692 mg via INTRAVENOUS
  Filled 2022-10-24: qty 34.6

## 2022-10-24 MED ORDER — SODIUM CHLORIDE 0.9 % IV SOLN
60.0000 mg/m2 | Freq: Once | INTRAVENOUS | Status: AC
  Administered 2022-10-24: 103.2 mg via INTRAVENOUS
  Filled 2022-10-24: qty 24

## 2022-10-24 MED ORDER — SODIUM CHLORIDE 0.9 % IV SOLN
2400.0000 mg/m2 | INTRAVENOUS | Status: DC
  Administered 2022-10-24: 4150 mg via INTRAVENOUS
  Filled 2022-10-24: qty 83

## 2022-10-24 NOTE — Patient Instructions (Signed)
Fluorouracil Injection What is this medication? FLUOROURACIL (flure oh YOOR a sil) treats some types of cancer. It works by slowing down the growth of cancer cells. This medicine may be used for other purposes; ask your health care provider or pharmacist if you have questions. COMMON BRAND NAME(S): Adrucil What should I tell my care team before I take this medication? They need to know if you have any of these conditions: Blood disorders Dihydropyrimidine dehydrogenase (DPD) deficiency Infection, such as chickenpox, cold sores, herpes Kidney disease Liver disease Poor nutrition Recent or ongoing radiation therapy An unusual or allergic reaction to fluorouracil, other medications, foods, dyes, or preservatives If you or your partner are pregnant or trying to get pregnant Breast-feeding How should I use this medication? This medication is injected into a vein. It is administered by your care team in a hospital or clinic setting. Talk to your care team about the use of this medication in children. Special care may be needed. Overdosage: If you think you have taken too much of this medicine contact a poison control center or emergency room at once. NOTE: This medicine is only for you. Do not share this medicine with others. What if I miss a dose? Keep appointments for follow-up doses. It is important not to miss your dose. Call your care team if you are unable to keep an appointment. What may interact with this medication? Do not take this medication with any of the following: Live virus vaccines This medication may also interact with the following: Medications that treat or prevent blood clots, such as warfarin, enoxaparin, dalteparin This list may not describe all possible interactions. Give your health care provider a list of all the medicines, herbs, non-prescription drugs, or dietary supplements you use. Also tell them if you smoke, drink alcohol, or use illegal drugs. Some items may  interact with your medicine. What should I watch for while using this medication? Your condition will be monitored carefully while you are receiving this medication. This medication may make you feel generally unwell. This is not uncommon as chemotherapy can affect healthy cells as well as cancer cells. Report any side effects. Continue your course of treatment even though you feel ill unless your care team tells you to stop. In some cases, you may be given additional medications to help with side effects. Follow all directions for their use. This medication may increase your risk of getting an infection. Call your care team for advice if you get a fever, chills, sore throat, or other symptoms of a cold or flu. Do not treat yourself. Try to avoid being around people who are sick. This medication may increase your risk to bruise or bleed. Call your care team if you notice any unusual bleeding. Be careful brushing or flossing your teeth or using a toothpick because you may get an infection or bleed more easily. If you have any dental work done, tell your dentist you are receiving this medication. Avoid taking medications that contain aspirin, acetaminophen, ibuprofen, naproxen, or ketoprofen unless instructed by your care team. These medications may hide a fever. Do not treat diarrhea with over the counter products. Contact your care team if you have diarrhea that lasts more than 2 days or if it is severe and watery. This medication can make you more sensitive to the sun. Keep out of the sun. If you cannot avoid being in the sun, wear protective clothing and sunscreen. Do not use sun lamps, tanning beds, or tanning booths. Talk to   your care team if you or your partner wish to become pregnant or think you might be pregnant. This medication can cause serious birth defects if taken during pregnancy and for 3 months after the last dose. A reliable form of contraception is recommended while taking this  medication and for 3 months after the last dose. Talk to your care team about effective forms of contraception. Do not father a child while taking this medication and for 3 months after the last dose. Use a condom while having sex during this time period. Do not breastfeed while taking this medication. This medication may cause infertility. Talk to your care team if you are concerned about your fertility. What side effects may I notice from receiving this medication? Side effects that you should report to your care team as soon as possible: Allergic reactions--skin rash, itching, hives, swelling of the face, lips, tongue, or throat Heart attack--pain or tightness in the chest, shoulders, arms, or jaw, nausea, shortness of breath, cold or clammy skin, feeling faint or lightheaded Heart failure--shortness of breath, swelling of the ankles, feet, or hands, sudden weight gain, unusual weakness or fatigue Heart rhythm changes--fast or irregular heartbeat, dizziness, feeling faint or lightheaded, chest pain, trouble breathing High ammonia level--unusual weakness or fatigue, confusion, loss of appetite, nausea, vomiting, seizures Infection--fever, chills, cough, sore throat, wounds that don't heal, pain or trouble when passing urine, general feeling of discomfort or being unwell Low red blood cell level--unusual weakness or fatigue, dizziness, headache, trouble breathing Pain, tingling, or numbness in the hands or feet, muscle weakness, change in vision, confusion or trouble speaking, loss of balance or coordination, trouble walking, seizures Redness, swelling, and blistering of the skin over hands and feet Severe or prolonged diarrhea Unusual bruising or bleeding Side effects that usually do not require medical attention (report to your care team if they continue or are bothersome): Dry skin Headache Increased tears Nausea Pain, redness, or swelling with sores inside the mouth or throat Sensitivity  to light Vomiting This list may not describe all possible side effects. Call your doctor for medical advice about side effects. You may report side effects to FDA at 1-800-FDA-1088. Where should I keep my medication? This medication is given in a hospital or clinic. It will not be stored at home. NOTE: This sheet is a summary. It may not cover all possible information. If you have questions about this medicine, talk to your doctor, pharmacist, or health care provider.  2023 Elsevier/Gold Standard (2022-02-27 00:00:00)  

## 2022-10-24 NOTE — Telephone Encounter (Signed)
Faxed over patient information for the patients insurance as per Dr. Burr Medico

## 2022-10-26 ENCOUNTER — Other Ambulatory Visit: Payer: Self-pay

## 2022-10-26 ENCOUNTER — Inpatient Hospital Stay: Payer: Medicare HMO

## 2022-10-26 DIAGNOSIS — Z5111 Encounter for antineoplastic chemotherapy: Secondary | ICD-10-CM | POA: Diagnosis not present

## 2022-10-26 DIAGNOSIS — C250 Malignant neoplasm of head of pancreas: Secondary | ICD-10-CM

## 2022-10-26 MED ORDER — SODIUM CHLORIDE 0.9% FLUSH
10.0000 mL | INTRAVENOUS | Status: DC | PRN
  Administered 2022-10-26: 10 mL

## 2022-10-26 MED ORDER — HEPARIN SOD (PORK) LOCK FLUSH 100 UNIT/ML IV SOLN
500.0000 [IU] | Freq: Once | INTRAVENOUS | Status: AC | PRN
  Administered 2022-10-26: 500 [IU]

## 2022-10-28 ENCOUNTER — Other Ambulatory Visit: Payer: Self-pay

## 2022-10-31 ENCOUNTER — Other Ambulatory Visit: Payer: Self-pay

## 2022-10-31 NOTE — Progress Notes (Signed)
The proposed treatment discussed in conference is for discussion purpose only and is not a binding recommendation.  The patients have not been physically examined, or presented with their treatment options.  Therefore, final treatment plans cannot be decided.  

## 2022-11-02 MED FILL — Dexamethasone Sodium Phosphate Inj 100 MG/10ML: INTRAMUSCULAR | Qty: 1 | Status: AC

## 2022-11-05 NOTE — Progress Notes (Unsigned)
[place]   Telephone:([phone] Fax:(336) [phone]   Clinic Follow up Note   Patient Care Team: [name], MD as PCP - General (Internal Medicine) [name], MD as PCP - Cardiology (Cardiology) Truitt Merle, MD as Consulting Physician (Oncology) 11/06/2022  CHIEF COMPLAINT: Follow up pancreatic cancer   SUMMARY OF ONCOLOGIC HISTORY: Oncology History Overview Note   Cancer Staging  Pancreatic cancer [facility]) Staging form: Exocrine Pancreas, AJCC 8th Edition - Clinical stage from 01/18/2022: Stage IB (cT2, cN0, cM0) - Signed by Truitt Merle, MD on 01/28/2022    Pancreatic cancer ([place])  01/16/2022 Imaging   CLINICAL DATA:  Jaundice   EXAM: CT ABDOMEN AND PELVIS WITH CONTRAST  IMPRESSION: 1. Marked intra and extrahepatic biliary dilatation with marked dilatation of pancreatic duct and distended gallbladder. There is significant atrophy of the body and tail of pancreas. 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 2. Diverticular disease of the colon without acute wall thickening 3. Mild endometrial thickening up to 9 mm, recommend nonemergent pelvic ultrasound for further evaluation   01/17/2022 Imaging   EXAM: MRI ABDOMEN WITHOUT AND WITH CONTRAST (INCLUDING MRCP)  IMPRESSION: 1. Obstructing mass within the pancreatic head with an intraluminal component demonstrating low level enhancement. 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 2. This mass causes significant extrinsic narrowing at the portal/superior mesenteric vein confluence as well as marked intra and extrahepatic biliary dilatation. 3. No distant metastases identified. 4. Endoscopy for  endoscopic ultrasound, tissue sampling and biliary decompression recommended.   01/18/2022 Cancer Staging   Staging form: Exocrine Pancreas, AJCC 8th Edition - Clinical stage from 01/18/2022: Stage IB (cT2, cN0, cM0) - Signed by Truitt Merle, MD on 01/28/2022 Stage prefix: Initial diagnosis Total positive nodes: 0   01/18/2022 Initial Biopsy   FINAL MICROSCOPIC DIAGNOSIS:  A. PANCREAS, HEAD, FINE NEEDLE ASPIRATION:  - Malignant cells present consistent with adenocarcinoma   01/24/2022 Imaging   EXAM: CT CHEST WITHOUT CONTRAST  IMPRESSION: 1. No evidence of metastatic disease in the chest. 2. Atelectasis or consolidation in the right lower lung. 3. Healing right rib fractures. 4. Mass in the head of the pancreas. See previous report of CT abdomen and pelvis 01/16/2022. Interval placement of the biliary stent.   01/28/2022 Initial Diagnosis   Pancreatic cancer ([place])   02/16/2022 - 06/28/2022 Chemotherapy   Patient is on Treatment Plan : PANCREATIC Abraxane / Gemcitabine D1,8 q21d     02/16/2022 Genetic Testing   Negative genetic testing on the CancerNext-Expanded+RNAinsight panel.  The report date is February 16, 2022.  The CancerNext-Expanded gene panel offered by [facility] and includes sequencing and rearrangement analysis for the following 77 genes: AIP, ALK, APC*, ATM*, AXIN2, BAP1, BARD1, BLM, BMPR1A, BRCA1*, BRCA2*, BRIP1*, CDC73, CDH1*, CDK4, CDKN1B, CDKN2A, CHEK2*, CTNNA1, DICER1, FANCC, FH, FLCN, GALNT12, KIF1B, LZTR1, MAX, MEN1, MET, MLH1*, MSH2*, MSH3, MSH6*, MUTYH*, NBN, NF1*, NF2, NTHL1, PALB2*, PHOX2B, PMS2*, POT1, PRKAR1A, PTCH1, PTEN*, RAD51C*, RAD51D*, RB1, RECQL, RET, SDHA, SDHAF2, SDHB, SDHC, SDHD, SMAD4, SMARCA4, SMARCB1, SMARCE1, STK11, SUFU, TMEM127, TP53*, TSC1, TSC2, VHL and XRCC2 (sequencing and deletion/duplication); EGFR, EGLN1, HOXB13, KIT, MITF, PDGFRA, POLD1, and POLE (sequencing only); EPCAM and GREM1 (deletion/duplication only). DNA and RNA analyses performed for *  genes.    06/14/2022 -  09/19/2022 Chemotherapy   Patient is on Treatment Plan : PANCREATIC Abraxane D1,8,15 + Gemcitabine D1,8,15 q28d     10/11/2022 -  Chemotherapy   Patient is on Treatment Plan : PANCREAS Liposomal Irinotecan + Leucovorin + 5-FU IVCI q14d       CURRENT THERAPY: Second line FOLFIRI (liposomal irinotecan/5FU)  INTERVAL HISTORY: Miranda Francis returns for follow up as scheduled. Last seen by Dr. Burr Medico 10/24/22 and completed cycle 2 lipo irinotecan/5FU. She felt sicker longer with cycle 2, low appetite and n/v x3 over that many days. Anti-emetics are not very helpful. She recalls being told to hold off on decadron so she has not tried that. Has trouble sleeping, takes ambien occasionally. It took her nearly 2 weeks to recover but she is ready for treatment today. Otherwise doing ok. Bowels moving every other day without medication. Neuropathy is stable. Denies pain, fever, shaking chills, rash, mucositis, dysuria or signs of infection.    REVIEW OF SYSTEMS:   All other systems were reviewed with the patient and are negative.  MEDICAL HISTORY:  Past Medical History:  Diagnosis Date   Blood transfusion    Diabetes mellitus    Family history of breast cancer    Family history of colon cancer    Family history of prostate cancer    Heart murmur    Hypertension    pancreatic ca 12/2021    SURGICAL HISTORY: Past Surgical History:  Procedure Laterality Date   BILIARY STENT PLACEMENT  01/18/2022   Procedure: BILIARY STENT PLACEMENT;  Surgeon: Carol Ada, MD;  Location: [place];  Service: Gastroenterology;;   DILATION AND CURETTAGE OF UTERUS     ERCP N/A 01/18/2022   Procedure: ENDOSCOPIC RETROGRADE CHOLANGIOPANCREATOGRAPHY (ERCP);  Surgeon: Carol Ada, MD;  Location: [place];  Service: Gastroenterology;  Laterality: N/A;   ESOPHAGOGASTRODUODENOSCOPY N/A 01/18/2022   Procedure: ESOPHAGOGASTRODUODENOSCOPY (EGD);  Surgeon: Carol Ada, MD;  Location: [place];   Service: Gastroenterology;  Laterality: N/A;   EUS  01/18/2022   Procedure: UPPER ENDOSCOPIC ULTRASOUND (EUS) LINEAR;  Surgeon: Carol Ada, MD;  Location: [facility] ENDOSCOPY;  Service: Gastroenterology;;   FINE NEEDLE ASPIRATION  01/18/2022   Procedure: FINE NEEDLE ASPIRATION (FNA) LINEAR;  Surgeon: Carol Ada, MD;  Location: [facility] ENDOSCOPY;  Service: Gastroenterology;;   IR IMAGING GUIDED PORT INSERTION  02/15/2022   NO PAST SURGERIES     SPHINCTEROTOMY  01/18/2022   Procedure: Joan Mayans;  Surgeon: Carol Ada, MD;  Location: [place];  Service: Gastroenterology;;    I have reviewed the social history and family history with the patient and they are unchanged from previous note.  ALLERGIES:  is allergic to irinotecan liposome.  MEDICATIONS:  Current Outpatient Medications  Medication Sig Dispense Refill   mirtazapine (REMERON) 7.5 MG tablet Take 1 tablet (7.5 mg total) by mouth at bedtime. 30 tablet 0   acetaminophen (TYLENOL) 500 MG tablet Take 1,000 mg by mouth every 6 (six) hours as needed for moderate pain or headache.     amLODipine (NORVASC) 5 MG tablet Take 5 mg by mouth daily.     Cholecalciferol (QC VITAMIN D3) 50 MCG (2000 UT) TABS Take 2,000 Units by mouth daily.     Continuous Blood Gluc Sensor (FREESTYLE LIBRE 2 SENSOR) MISC USE 1 SENSOR AND CHANGE EVERY 14 DAYS AS DIRECTED.     dexamethasone (DECADRON) 4 MG tablet Take 2 tablets (8 mg total) by mouth daily. Start the day after irinotecan chemotherapy for 2 days. Take with food. 8 tablet 5  gabapentin (NEURONTIN) 100 MG capsule Take 2-3 capsules (200-300 mg total) by mouth at bedtime. 90 capsule 0   KLOR-CON M20 20 MEQ tablet Take 20 mEq by mouth 2 (two) times daily.     lidocaine-prilocaine (EMLA) cream Apply 1 application topically as needed. 30 g 1   losartan (COZAAR) 100 MG tablet Take 100 mg by mouth daily.     NOVOLOG FLEXPEN 100 UNIT/ML FlexPen Inject 15 Units into the skin 2 (two) times daily.     ondansetron  (ZOFRAN) 8 MG tablet Take 1 tablet (8 mg total) by mouth every 8 (eight) hours as needed for nausea or vomiting. Start on the third day after irinotecan 30 tablet 1   oxyCODONE (OXY IR/ROXICODONE) 5 MG immediate release tablet Take 1 tablet (5 mg total) by mouth every 3 (three) hours as needed for severe pain. 20 tablet 0   polyethylene glycol (MIRALAX / GLYCOLAX) 17 g packet Take 17 g by mouth daily. 14 each 0   prochlorperazine (COMPAZINE) 10 MG tablet Take 1 tablet (10 mg total) by mouth every 6 (six) hours as needed for nausea or vomiting. 30 tablet 1   zolpidem (AMBIEN) 10 MG tablet Take 10 mg by mouth at bedtime as needed.     No current facility-administered medications for this visit.   Facility-Administered Medications Ordered in Other Visits  Medication Dose Route Frequency Provider Last Rate Last Admin   atropine injection 0.4 mg  0.4 mg Intravenous Once Truitt Merle, MD       dexamethasone (DECADRON) 10 mg in sodium chloride 0.9 % 50 mL IVPB  10 mg Intravenous Once Truitt Merle, MD       famotidine (PEPCID) IVPB 20 mg premix  20 mg Intravenous Once Truitt Merle, MD       fluorouracil (ADRUCIL) 4,150 mg in sodium chloride 0.9 % 67 mL chemo infusion  2,400 mg/m2 (Treatment Plan Recorded) Intravenous 1 day or 1 dose Truitt Merle, MD       fosaprepitant (EMEND) 150 mg in sodium chloride 0.9 % 145 mL IVPB  150 mg Intravenous Once Alla Feeling, NP       heparin lock flush 100 unit/mL  500 Units Intracatheter Once PRN Truitt Merle, MD       irinotecan LIPOSOME (ONIVYDE) 103.2 mg in sodium chloride 0.9 % 500 mL chemo infusion  60 mg/m2 (Treatment Plan Recorded) Intravenous Once Truitt Merle, MD       leucovorin 692 mg in sodium chloride 0.9 % 250 mL infusion  400 mg/m2 (Treatment Plan Recorded) Intravenous Once Truitt Merle, MD       sodium chloride flush (NS) 0.9 % injection 10 mL  10 mL Intracatheter PRN Truitt Merle, MD        PHYSICAL EXAMINATION: ECOG PERFORMANCE STATUS: 1 - Symptomatic but completely  ambulatory  Vitals:   11/06/22 0853  BP: (!) 153/84  Pulse: 64  Resp: 18  Temp: 100 F (37.8 C)  SpO2: 100%   Filed Weights   11/06/22 0853  Weight: 142 lb 11.2 oz (64.7 kg)    GENERAL:alert, no distress and comfortable SKIN: no rash  EYES:  sclera clear NECK: without mass LYMPH:  no palpable cervical or supraclavicular lymphadenopathy LUNGS: clear with normal breathing effort HEART: regular rate & rhythm, no lower extremity edema ABDOMEN:abdomen soft, non-tender and normal bowel sounds NEURO: alert & oriented x 3 with fluent speech, no focal motor deficits PAC without erythema  LABORATORY DATA:  I have reviewed the data as  listed    Latest Ref Rng & Units 11/06/2022    8:32 AM 10/24/2022    9:07 AM 10/11/2022    8:58 AM  CBC  WBC 4.0 - 10.5 K/uL 5.8  7.2  6.5   Hemoglobin 12.0 - 15.0 g/dL 9.4  8.9  9.0   Hematocrit 36.0 - 46.0 % 28.8  27.4  27.6   Platelets 150 - 400 K/uL 166  176  215         Latest Ref Rng & Units 11/06/2022    8:32 AM 10/24/2022    9:07 AM 10/11/2022    8:58 AM  CMP  Glucose 70 - 99 mg/dL 163  180  152   BUN 8 - 23 mg/dL _0 Creatinine 0.44 - 1.00 mg/dL 0.62  0.57  0.49   Sodium 135 - 145 mmol/L 138  140  139   Potassium 3.5 - 5.1 mmol/L 4.0  4.1  3.9   Chloride 98 - 111 mmol/L 109  109  108   CO2 22 - 32 mmol/L _1 Calcium 8.9 - 10.3 mg/dL 9.0  9.3  9.3   Total Protein 6.5 - 8.1 g/dL 6.3  6.3  7.2   Total Bilirubin 0.3 - 1.2 mg/dL 0.3  0.3  0.4   Alkaline Phos 38 - 126 U/L 177  190  183   AST 15 - 41 U/L _2 ALT 0 - 44 U/L _3 RADIOGRAPHIC STUDIES: I have personally reviewed the radiological images as listed and agreed with the findings in the report. No results found.   ASSESSMENT & PLAN: Miranda Francis is a 76 y.o. female with    1. Pancreatic Cancer, stage IB c(T2, N0, M0), indeterminate liver lesions; MMR normal   -presented to PCP with jaundice for one month. Also complained of  epigastric pain, poor appetite, weight loss, and light-colored stool.  -work up showed obstructing mass within pancreatic head with low-level enhancement, causing significant extrinsic narrowing at SMV and dilatation. -ERCP on 01/18/22 by Dr. Benson Norway, FNA of pancreas head mass showed malignant cells consistent with adenocarcinoma. MMR normal. -baseline CA 19-9 on 01/16/22 was elevated at 9,561. -She began neoadjuvant chemo with gemcitabine and abraxane in 02/2022 -MRI 06/19/22 showed subtle liver lesions (stable on MRI 01/2022 but not reported then), no change in pancreatic mass -Surgeon Dr. Zenia Resides feels she is not a good surgical candidate due to indeterminate liver lesions and CA 19-9 >1000 -Restaging CT CAP 09/24/22 showed enlarging liver lesions, stable pancreatic head mass, no other new disease -Her case was reviewed again in tumor board, the recommendation is for US guided liver bx per IR, I discussed this with her and pt agrees. Will ask for enough tissue for [facility] molecular testing -Ms. Wingert appears stable. S/p cycle 2 FOLFIRI with liposomal irinotecan. She did not tolerate cycle 2 as well, with more n/v, lower appetite, and feeling unwell for close to 2 weeks. She has recovered today. No clinical evidence of disease progression. -We reviewed symptom management. Will add IV Emend to pre-meds, and she will take dex 4 mg daily x3 - 5 days after chemo. Will add Mirtazapine for low appetite/depression/poor sleep. She knows not to take with ambien.  -Labs reviewed, adequate for treatment. CA 19-9 up to 23K on cycle 1 of this regimen, Will monitor. Today's level is pending -F/up  and cycle 4 in 2 weeks    2. Symptom Management: decreased appetite with weight loss, abdominal discomfort, neuropathy, constipation -she reports a ~40 lb weight loss over the last 4 months or so. Trying ensure, boost, and glucerna -f/up dietician  -marinol prescribed by PCP was not helpful, mirtazapine works better.   Now off both with improved appetite -zofran is not helpful. Dr. Mickeal Skinner recommended to stop Compazine due to antidopaminergic effects which he felt was contributing to general dyskinesia.  She also is not taking Elavil which she prescribed for sleep and neuro side effects from chemo -Adding Emend to pre-meds and dex x3-5 days after chemo -will restart mirtazapine for low appetite. She tolerated this well in the past -bowels moving off medication for now -Gabapentin currently 200 mg nightly, can increase to 300 mg; stable   3. Genetic Testing -she reports colon cancer in a brother (age 47) and a sister (age 84). -she proceeded with testing on 02/07/22. Results were negative.   PLAN: -Reviewed tumor board discussion, pt agrees to liver bx -Labs reviewed, proceed with cycle 3 liposomal irinotecan/5FU today, same dose -Add IV Emend and pt will take dex 4 mg po daily x3 - 5 days after chemo, starting on day 2 -Rx: Mirtazapine (pt tolerated this well in the past) -Refill gabapentin -Instructions printed for pt, to be given with AVS by infusion RN -F/up and cycle 4 in 2 weeks    Orders Placed This Encounter  Procedures   [place] CORE BIOPSY (LIVER)    Standing Status:   Future    Standing Expiration Date:   11/07/2023    Scheduling Instructions:     Case reviewed in multidisciplinary conference and accepted by IR MD    Order Specific Question:   Lab orders requested (DO NOT place separate lab orders, these will be automatically ordered during procedure specimen collection):    Answer:   Surgical Pathology    Order Specific Question:   Reason for Exam (SYMPTOM  OR DIAGNOSIS REQUIRED)    Answer:   pancreatic cancer, confirm liver mets. please obtain enough tissue for additional molecular testing    Order Specific Question:   Preferred location?    Answer:   [facility]   All questions were answered. The patient knows to call the clinic with any problems, questions or concerns. No  barriers to learning was detected. I spent 20 minutes counseling the patient face to face. The total time spent in the appointment was 30 minutes and more than 50% was on counseling, review of test results, and coordination of care.      Alla Feeling, NP 11/06/22

## 2022-11-06 ENCOUNTER — Inpatient Hospital Stay: Payer: Medicare HMO

## 2022-11-06 ENCOUNTER — Other Ambulatory Visit: Payer: Self-pay

## 2022-11-06 ENCOUNTER — Inpatient Hospital Stay (HOSPITAL_BASED_OUTPATIENT_CLINIC_OR_DEPARTMENT_OTHER): Payer: Medicare HMO | Admitting: Nurse Practitioner

## 2022-11-06 ENCOUNTER — Encounter: Payer: Self-pay | Admitting: General Practice

## 2022-11-06 ENCOUNTER — Encounter: Payer: Self-pay | Admitting: Nurse Practitioner

## 2022-11-06 ENCOUNTER — Other Ambulatory Visit (HOSPITAL_BASED_OUTPATIENT_CLINIC_OR_DEPARTMENT_OTHER): Payer: Self-pay

## 2022-11-06 DIAGNOSIS — Z95828 Presence of other vascular implants and grafts: Secondary | ICD-10-CM

## 2022-11-06 DIAGNOSIS — C250 Malignant neoplasm of head of pancreas: Secondary | ICD-10-CM

## 2022-11-06 DIAGNOSIS — Z5111 Encounter for antineoplastic chemotherapy: Secondary | ICD-10-CM | POA: Diagnosis not present

## 2022-11-06 LAB — CBC WITH DIFFERENTIAL (CANCER CENTER ONLY)
Abs Immature Granulocytes: 0.01 10*3/uL (ref 0.00–0.07)
Basophils Absolute: 0.1 10*3/uL (ref 0.0–0.1)
Basophils Relative: 1 %
Eosinophils Absolute: 0.3 10*3/uL (ref 0.0–0.5)
Eosinophils Relative: 6 %
HCT: 28.8 % — ABNORMAL LOW (ref 36.0–46.0)
Hemoglobin: 9.4 g/dL — ABNORMAL LOW (ref 12.0–15.0)
Immature Granulocytes: 0 %
Lymphocytes Relative: 35 %
Lymphs Abs: 2 10*3/uL (ref 0.7–4.0)
MCH: 29.5 pg (ref 26.0–34.0)
MCHC: 32.6 g/dL (ref 30.0–36.0)
MCV: 90.3 fL (ref 80.0–100.0)
Monocytes Absolute: 0.4 10*3/uL (ref 0.1–1.0)
Monocytes Relative: 7 %
Neutro Abs: 3 10*3/uL (ref 1.7–7.7)
Neutrophils Relative %: 51 %
Platelet Count: 166 10*3/uL (ref 150–400)
RBC: 3.19 MIL/uL — ABNORMAL LOW (ref 3.87–5.11)
RDW: 19.3 % — ABNORMAL HIGH (ref 11.5–15.5)
WBC Count: 5.8 10*3/uL (ref 4.0–10.5)
nRBC: 0 % (ref 0.0–0.2)

## 2022-11-06 LAB — CMP (CANCER CENTER ONLY)
ALT: 12 U/L (ref 0–44)
AST: 17 U/L (ref 15–41)
Albumin: 3.4 g/dL — ABNORMAL LOW (ref 3.5–5.0)
Alkaline Phosphatase: 177 U/L — ABNORMAL HIGH (ref 38–126)
Anion gap: 5 (ref 5–15)
BUN: 12 mg/dL (ref 8–23)
CO2: 24 mmol/L (ref 22–32)
Calcium: 9 mg/dL (ref 8.9–10.3)
Chloride: 109 mmol/L (ref 98–111)
Creatinine: 0.62 mg/dL (ref 0.44–1.00)
GFR, Estimated: >60 mL/min (ref >60)
Glucose, Bld: 163 mg/dL — ABNORMAL HIGH (ref 70–99)
Potassium: 4 mmol/L (ref 3.5–5.1)
Sodium: 138 mmol/L (ref 135–145)
Total Bilirubin: 0.3 mg/dL (ref 0.3–1.2)
Total Protein: 6.3 g/dL — ABNORMAL LOW (ref 6.5–8.1)

## 2022-11-06 MED ORDER — PALONOSETRON HCL INJECTION 0.25 MG/5ML
0.2500 mg | Freq: Once | INTRAVENOUS | Status: AC
  Administered 2022-11-06: 0.25 mg via INTRAVENOUS
  Filled 2022-11-06: qty 5

## 2022-11-06 MED ORDER — MIRTAZAPINE 7.5 MG PO TABS
7.5000 mg | ORAL_TABLET | Freq: Every day | ORAL | 0 refills | Status: DC
  Filled 2022-11-06: qty 30, 30d supply, fill #0

## 2022-11-06 MED ORDER — SODIUM CHLORIDE 0.9 % IV SOLN
150.0000 mg | Freq: Once | INTRAVENOUS | Status: AC
  Administered 2022-11-06: 150 mg via INTRAVENOUS
  Filled 2022-11-06: qty 150

## 2022-11-06 MED ORDER — SODIUM CHLORIDE 0.9 % IV SOLN
60.0000 mg/m2 | Freq: Once | INTRAVENOUS | Status: AC
  Administered 2022-11-06: 103.2 mg via INTRAVENOUS
  Filled 2022-11-06: qty 24

## 2022-11-06 MED ORDER — SODIUM CHLORIDE 0.9 % IV SOLN
400.0000 mg/m2 | Freq: Once | INTRAVENOUS | Status: AC
  Administered 2022-11-06: 692 mg via INTRAVENOUS
  Filled 2022-11-06: qty 34.6

## 2022-11-06 MED ORDER — FAMOTIDINE IN NACL 20-0.9 MG/50ML-% IV SOLN
20.0000 mg | Freq: Once | INTRAVENOUS | Status: AC
  Administered 2022-11-06: 20 mg via INTRAVENOUS
  Filled 2022-11-06: qty 50

## 2022-11-06 MED ORDER — SODIUM CHLORIDE 0.9% FLUSH
10.0000 mL | Freq: Once | INTRAVENOUS | Status: AC
  Administered 2022-11-06: 10 mL

## 2022-11-06 MED ORDER — SODIUM CHLORIDE 0.9% FLUSH: 10.0000 mL | INTRAVENOUS | Status: DC | PRN

## 2022-11-06 MED ORDER — GABAPENTIN 100 MG PO CAPS
200.0000 mg | ORAL_CAPSULE | Freq: Every day | ORAL | 0 refills | Status: DC
  Filled 2022-11-06: qty 90, 30d supply, fill #0

## 2022-11-06 MED ORDER — ATROPINE SULFATE 1 MG/ML IV SOLN
0.4000 mg | Freq: Once | INTRAVENOUS | Status: AC
  Administered 2022-11-06: 0.4 mg via INTRAVENOUS
  Filled 2022-11-06: qty 1

## 2022-11-06 MED ORDER — SODIUM CHLORIDE 0.9 % IV SOLN
2400.0000 mg/m2 | INTRAVENOUS | Status: DC
  Administered 2022-11-06: 4150 mg via INTRAVENOUS
  Filled 2022-11-06: qty 83

## 2022-11-06 MED ORDER — HEPARIN SOD (PORK) LOCK FLUSH 100 UNIT/ML IV SOLN: 500.0000 [IU] | Freq: Once | INTRAVENOUS | Status: DC | PRN

## 2022-11-06 MED ORDER — SODIUM CHLORIDE 0.9 % IV SOLN
10.0000 mg | Freq: Once | INTRAVENOUS | Status: AC
  Administered 2022-11-06: 10 mg via INTRAVENOUS
  Filled 2022-11-06: qty 10

## 2022-11-06 MED ORDER — SODIUM CHLORIDE 0.9 % IV SOLN: Freq: Once | INTRAVENOUS | Status: AC

## 2022-11-06 NOTE — Patient Instructions (Signed)
Emend was added to the pre-med regimen to help reduce nausea/vomiting with chemo. Please take dexamethasone (decadron, steroid) 1 tablet daily for 3 to 5 days, starting 12/27. This is to help energy, low appetite, nausea and vomiting. Take in the morning. You have a new prescription for mirtazapine (remeron), for low appetite, low mood/depression, and poor sleep. Please take this 1 tablet every night. Do not take with Ambien.

## 2022-11-06 NOTE — Patient Instructions (Signed)
[facility] ONCOL[facility]  Discharge Instructions: Thank you for choosing [facility] to provide your oncology and hematology care.   If you have a lab appointment with the [facility], please go directly to the [facility] and check in at the registration area.   Wear comfortable clothing and clothing appropriate for easy access to any Portacath or PICC line.   We strive to give you quality time with your provider. You may need to reschedule your appointment if you arrive late (15 or more minutes).  Arriving late affects you and other patients whose appointments are after yours.  Also, if you miss three or more appointments without notifying the office, you may be dismissed from the clinic at the provider's discretion.      For prescription refill requests, have your pharmacy contact our office and allow 72 hours for refills to be completed.    Today you received the following chemotherapy and/or immunotherapy agents Irinotecan liposomal, leucovorin, 5Fu      To help prevent nausea and vomiting after your treatment, we encourage you to take your nausea medication as directed.  BELOW ARE SYMPTOMS THAT SHOULD BE REPORTED IMMEDIATELY: *FEVER GREATER THAN 100.4 F (38 C) OR HIGHER *CHILLS OR SWEATING *NAUSEA AND VOMITING THAT IS NOT CONTROLLED WITH YOUR NAUSEA MEDICATION *UNUSUAL SHORTNESS OF BREATH *UNUSUAL BRUISING OR BLEEDING *URINARY PROBLEMS (pain or burning when urinating, or frequent urination) *BOWEL PROBLEMS (unusual diarrhea, constipation, pain near the anus) TENDERNESS IN MOUTH AND THROAT WITH OR WITHOUT PRESENCE OF ULCERS (sore throat, sores in mouth, or a toothache) UNUSUAL RASH, SWELLING OR PAIN  UNUSUAL VAGINAL DISCHARGE OR ITCHING   Items with * indicate a potential emergency and should be followed up as soon as possible or go to the Emergency Department if any problems should occur.  Please show the CHEMOTHERAPY ALERT CARD or IMMUNOTHERAPY  ALERT CARD at check-in to the Emergency Department and triage nurse.  Should you have questions after your visit or need to cancel or reschedule your appointment, please contact [facility]  Dept: [phone]  and follow the prompts.  Office hours are 8:00 a.m. to 4:30 p.m. Monday - Friday. Please note that voicemails left after 4:00 p.m. may not be returned until the following business day.  We are closed weekends and major holidays. You have access to a nurse at all times for urgent questions. Please call the main number to the clinic Dept: [phone] and follow the prompts.   For any non-urgent questions, you may also contact your provider using MyChart. We now offer e-Visits for anyone 20 and older to request care online for non-urgent symptoms. For details visit [email or web address].   Also download the MyChart app! Go to the app store, search "MyChart", open the app, select [facility], and log in with your MyChart username and password.  Masks are optional in the cancer centers. If you would like for your care team to wear a mask while they are taking care of you, please let them know. You may have one support person who is at least 75 years old accompany you for your appointments.

## 2022-11-06 NOTE — Progress Notes (Signed)
Arne Cleveland, MD  Allen Kell, NT Ok  [place] core biopsy hepatic seg 2/3 met [facility]       Previous Messages    ----- Message ----- From: Allen Kell, NT Sent: 11/06/2022  10:15 AM EST To: Ir Procedure Requests Subject: [place] CORE BIOPSY (LIVER)                        Procedure: [place] CORE BIOPSY (LIVER)  Reason: pancreatic cancer, confirm liver mets. please obtain enough tissue for additional molecular testing  History: MRI and CT in chart  Provider: Alla Feeling, NP  Contact: [phone]

## 2022-11-07 ENCOUNTER — Other Ambulatory Visit: Payer: Self-pay

## 2022-11-08 ENCOUNTER — Inpatient Hospital Stay: Payer: Medicare HMO

## 2022-11-08 ENCOUNTER — Other Ambulatory Visit: Payer: Self-pay

## 2022-11-08 VITALS — BP 163/70 | HR 60 | Temp 98.2°F | Resp 18

## 2022-11-08 DIAGNOSIS — Z5111 Encounter for antineoplastic chemotherapy: Secondary | ICD-10-CM | POA: Diagnosis not present

## 2022-11-08 DIAGNOSIS — C250 Malignant neoplasm of head of pancreas: Secondary | ICD-10-CM

## 2022-11-08 LAB — CANCER ANTIGEN 19-9: CA 19-9: 28160 U/mL — ABNORMAL HIGH (ref 0–35)

## 2022-11-08 MED ORDER — SODIUM CHLORIDE 0.9% FLUSH
10.0000 mL | INTRAVENOUS | Status: DC | PRN
  Administered 2022-11-08: 10 mL

## 2022-11-08 MED ORDER — HEPARIN SOD (PORK) LOCK FLUSH 100 UNIT/ML IV SOLN
500.0000 [IU] | Freq: Once | INTRAVENOUS | Status: AC | PRN
  Administered 2022-11-08: 500 [IU]

## 2022-11-10 ENCOUNTER — Other Ambulatory Visit: Payer: Self-pay

## 2022-11-13 ENCOUNTER — Encounter (HOSPITAL_COMMUNITY): Payer: Self-pay

## 2022-11-13 ENCOUNTER — Emergency Department (HOSPITAL_COMMUNITY)
Admission: EM | Admit: 2022-11-13 | Discharge: 2022-11-14 | Disposition: A | Discharge status: Home / Self Care | Payer: Medicare HMO | Attending: Emergency Medicine | Admitting: Emergency Medicine

## 2022-11-13 ENCOUNTER — Other Ambulatory Visit: Payer: Self-pay

## 2022-11-13 DIAGNOSIS — Z1152 Encounter for screening for COVID-19: Secondary | ICD-10-CM | POA: Insufficient documentation

## 2022-11-13 DIAGNOSIS — R112 Nausea with vomiting, unspecified: Secondary | ICD-10-CM | POA: Insufficient documentation

## 2022-11-13 DIAGNOSIS — Z794 Long term (current) use of insulin: Secondary | ICD-10-CM | POA: Diagnosis not present

## 2022-11-13 DIAGNOSIS — E860 Dehydration: Secondary | ICD-10-CM | POA: Insufficient documentation

## 2022-11-13 DIAGNOSIS — R197 Diarrhea, unspecified: Secondary | ICD-10-CM | POA: Insufficient documentation

## 2022-11-13 LAB — COMPREHENSIVE METABOLIC PANEL
ALT: 20 U/L (ref 0–44)
AST: 14 U/L — ABNORMAL LOW (ref 15–41)
Albumin: 3 g/dL — ABNORMAL LOW (ref 3.5–5.0)
Alkaline Phosphatase: 184 U/L — ABNORMAL HIGH (ref 38–126)
Anion gap: 9 (ref 5–15)
BUN: 13 mg/dL (ref 8–23)
CO2: 24 mmol/L (ref 22–32)
Calcium: 8.4 mg/dL — ABNORMAL LOW (ref 8.9–10.3)
Chloride: 102 mmol/L (ref 98–111)
Creatinine, Ser: 0.65 mg/dL (ref 0.44–1.00)
GFR, Estimated: >60 mL/min (ref >60)
Glucose, Bld: 173 mg/dL — ABNORMAL HIGH (ref 70–99)
Potassium: 3.7 mmol/L (ref 3.5–5.1)
Sodium: 135 mmol/L (ref 135–145)
Total Bilirubin: 0.6 mg/dL (ref 0.3–1.2)
Total Protein: 6.2 g/dL — ABNORMAL LOW (ref 6.5–8.1)

## 2022-11-13 LAB — CBC WITH DIFFERENTIAL/PLATELET
Abs Immature Granulocytes: 0.03 10*3/uL (ref 0.00–0.07)
Basophils Absolute: 0 10*3/uL (ref 0.0–0.1)
Basophils Relative: 1 %
Eosinophils Absolute: 0.2 10*3/uL (ref 0.0–0.5)
Eosinophils Relative: 3 %
HCT: 32.6 % — ABNORMAL LOW (ref 36.0–46.0)
Hemoglobin: 10.5 g/dL — ABNORMAL LOW (ref 12.0–15.0)
Immature Granulocytes: 1 %
Lymphocytes Relative: 36 %
Lymphs Abs: 2.3 10*3/uL (ref 0.7–4.0)
MCH: 29.2 pg (ref 26.0–34.0)
MCHC: 32.2 g/dL (ref 30.0–36.0)
MCV: 90.6 fL (ref 80.0–100.0)
Monocytes Absolute: 0.2 10*3/uL (ref 0.1–1.0)
Monocytes Relative: 3 %
Neutro Abs: 3.6 10*3/uL (ref 1.7–7.7)
Neutrophils Relative %: 56 %
Platelets: 140 10*3/uL — ABNORMAL LOW (ref 150–400)
RBC: 3.6 MIL/uL — ABNORMAL LOW (ref 3.87–5.11)
RDW: 19 % — ABNORMAL HIGH (ref 11.5–15.5)
WBC: 6.3 10*3/uL (ref 4.0–10.5)
nRBC: 0.3 % — ABNORMAL HIGH (ref 0.0–0.2)

## 2022-11-13 LAB — RESP PANEL BY RT-PCR (RSV, FLU A&B, COVID)  RVPGX2
Influenza A by PCR: NEGATIVE
Influenza B by PCR: NEGATIVE
Resp Syncytial Virus by PCR: NEGATIVE
SARS Coronavirus 2 by RT PCR: NEGATIVE

## 2022-11-13 LAB — LIPASE, BLOOD: Lipase: 32 U/L (ref 11–51)

## 2022-11-13 MED ORDER — DROPERIDOL 2.5 MG/ML IJ SOLN
2.5000 mg | Freq: Once | INTRAMUSCULAR | Status: AC
  Administered 2022-11-13: 2.5 mg via INTRAVENOUS
  Filled 2022-11-13: qty 2

## 2022-11-13 MED ORDER — LACTATED RINGERS IV BOLUS
1000.0000 mL | Freq: Once | INTRAVENOUS | Status: AC
  Administered 2022-11-13: 1000 mL via INTRAVENOUS

## 2022-11-13 MED ORDER — ONDANSETRON 4 MG PO TBDP: 4.0000 mg | ORAL_TABLET | Freq: Once | ORAL | Status: DC

## 2022-11-13 NOTE — ED Provider Notes (Signed)
[facility] DEPT Provider Note   CSN: [id number] Arrival date & time: 11/13/22  1116     History {Add pertinent medical, surgical, social history, OB history to HPI:1} Chief Complaint  Patient presents with   Emesis   Diarrhea    Miranda Francis is a 77 y.o. female.  The history is provided by the patient, the spouse and medical records.  Emesis Associated symptoms: diarrhea   Diarrhea Associated symptoms: vomiting   Miranda Francis is a 77 y.o. female who presents to the Emergency Department complaining of *** Treatment 12/26 3days Nausea, poor appetite.  Vomiting.  Diarrhea. Low energy Had less severe sxs two weeks ago.  Vomited yesterday and today (2 episodes each day).  Many loose stools. No blood.  Taking immodium.     No fever.  Has bumps in mouth - started today. Has mild abdominal discomfort. No dysuria.         Home Medications Prior to Admission medications   Medication Sig Start Date End Date Taking? Authorizing Provider  acetaminophen (TYLENOL) 500 MG tablet Take 1,000 mg by mouth every 6 (six) hours as needed for moderate pain or headache.    [provider]  amLODipine (NORVASC) 5 MG tablet Take 5 mg by mouth daily. 10/17/21   [provider]  Cholecalciferol (QC VITAMIN D3) 50 MCG (2000 UT) TABS Take 2,000 Units by mouth daily.    [provider]  Continuous Blood Gluc Sensor (FREESTYLE LIBRE 2 SENSOR) MISC USE 1 SENSOR AND CHANGE EVERY 14 DAYS AS DIRECTED. 09/07/22   [provider]  dexamethasone (DECADRON) 4 MG tablet Take 2 tablets (8 mg total) by mouth daily. Start the day after irinotecan chemotherapy for 2 days. Take with food. 10/08/22   Truitt Merle, MD  gabapentin (NEURONTIN) 100 MG capsule Take 2-3 capsules (200-300 mg total) by mouth at bedtime. 11/06/22   Alla Feeling, NP  KLOR-CON M20 20 MEQ tablet Take 20 mEq by mouth 2 (two) times daily. 01/14/22   [provider]   lidocaine-prilocaine (EMLA) cream Apply 1 application topically as needed. 07/12/22   Alla Feeling, NP  losartan (COZAAR) 100 MG tablet Take 100 mg by mouth daily. 07/12/22   [provider]  mirtazapine (REMERON) 7.5 MG tablet Take 1 tablet (7.5 mg total) by mouth at bedtime. 11/06/22   Alla Feeling, NP  NOVOLOG FLEXPEN 100 UNIT/ML FlexPen Inject 15 Units into the skin 2 (two) times daily. 05/03/22   [provider]  ondansetron (ZOFRAN) 8 MG tablet Take 1 tablet (8 mg total) by mouth every 8 (eight) hours as needed for nausea or vomiting. Start on the third day after irinotecan 10/08/22   Truitt Merle, MD  oxyCODONE (OXY IR/ROXICODONE) 5 MG immediate release tablet Take 1 tablet (5 mg total) by mouth every 3 (three) hours as needed for severe pain. 01/25/22   Shelly Coss, MD  polyethylene glycol (MIRALAX / GLYCOLAX) 17 g packet Take 17 g by mouth daily. 01/26/22   Shelly Coss, MD  prochlorperazine (COMPAZINE) 10 MG tablet Take 1 tablet (10 mg total) by mouth every 6 (six) hours as needed for nausea or vomiting. 10/08/22   Truitt Merle, MD  zolpidem (AMBIEN) 10 MG tablet Take 10 mg by mouth at bedtime as needed. 07/09/22   [provider]      Allergies    Irinotecan liposome    Review of Systems   Review of Systems  Gastrointestinal:  Positive for diarrhea  and vomiting.  All other systems reviewed and are negative.   Physical Exam Updated Vital Signs BP (!) 126/108 (BP Location: Right Arm)   Pulse 78   Temp 98.7 F (37.1 C) (Oral)   Resp 18   Ht '5\' 3"'$  (1.6 m)   Wt 64.7 kg   SpO2 98%   BMI 25.28 kg/m  Physical Exam Vitals and nursing note reviewed.  Constitutional:      Appearance: She is well-developed.  HENT:     Head: Normocephalic and atraumatic.  Cardiovascular:     Rate and Rhythm: Normal rate and regular rhythm.  Pulmonary:     Effort: Pulmonary effort is normal. No respiratory distress.  Abdominal:     Palpations: Abdomen is soft.      Tenderness: There is no abdominal tenderness. There is no guarding or rebound.  Musculoskeletal:        General: No swelling or tenderness.  Skin:    General: Skin is warm and dry.  Neurological:     Mental Status: She is alert and oriented to person, place, and time.  Psychiatric:        Behavior: Behavior normal.     ED Results / Procedures / Treatments   Labs (all labs ordered are listed, but only abnormal results are displayed) Labs Reviewed  CBC WITH DIFFERENTIAL/PLATELET - Abnormal; Notable for the following components:      Result Value   RBC 3.60 (*)    Hemoglobin 10.5 (*)    HCT 32.6 (*)    RDW 19.0 (*)    Platelets 140 (*)    nRBC 0.3 (*)    All other components within normal limits  COMPREHENSIVE METABOLIC PANEL - Abnormal; Notable for the following components:   Glucose, Bld 173 (*)    Calcium 8.4 (*)    Total Protein 6.2 (*)    Albumin 3.0 (*)    AST 14 (*)    Alkaline Phosphatase 184 (*)    All other components within normal limits  RESP PANEL BY RT-PCR (RSV, FLU A&B, COVID)  RVPGX2  LIPASE, BLOOD  URINALYSIS, ROUTINE W REFLEX MICROSCOPIC    EKG None  Radiology No results found.  Procedures Procedures  {Document cardiac monitor, telemetry assessment procedure when appropriate:1}  Medications Ordered in ED Medications  ondansetron (ZOFRAN-ODT) disintegrating tablet 4 mg (has no administration in time range)    ED Course/ Medical Decision Making/ A&P                           Medical Decision Making  ***  {Document critical care time when appropriate:1} {Document review of labs and clinical decision tools ie heart score, Chads2Vasc2 etc:1}  {Document your independent review of radiology images, and any outside records:1} {Document your discussion with family members, caretakers, and with consultants:1} {Document social determinants of health affecting pt's care:1} {Document your decision making why or why not admission, treatments were  needed:1} Final Clinical Impression(s) / ED Diagnoses Final diagnoses:  None    Rx / DC Orders ED Discharge Orders     None

## 2022-11-13 NOTE — ED Triage Notes (Signed)
Patient reports N/v/D since 11/06/22. Patient states she received her last chemo treatment on 11/06/22 for pancreatic cancer.

## 2022-11-13 NOTE — ED Provider Triage Note (Signed)
Emergency Medicine Provider Triage Evaluation Note  Miranda Francis , a 77 y.o. female  was evaluated in triage.  Pt complains of nausea vomiting diarrhea for 1 week.  Patient reports the symptoms began after she received her chemo treatment on 11/06/22.  Patient has not been able to eat since the 26th and has been able to tolerate only small amounts of fluid orally.   Review of Systems  Positive: See HPI Negative: Syncope, chest pain, shortness of breath, dysuria, back pain, headache, fevers, contacts, hematemesis, hematochezia  Physical Exam  BP (!) 151/118   Pulse (!) 49   Temp 98.3 F (36.8 C) (Oral)   Resp 20   Ht '5\' 3"'$  (1.6 m)   Wt 64.7 kg   SpO2 100%   BMI 25.28 kg/m  Gen:   Awake, no distress, appears uncomfortable on exam Resp:  Normal effort lungs CTAB MSK:   Moves extremities without difficulty  Abd:  Nontender to palpation, diminished bowel sounds, no guarding/distention/rebound tenderness HENT:  Dry oral mucous membranes, erythematous oropharynx  Medical Decision Making  Medically screening exam initiated at 12:34 PM.  Appropriate orders placed.  Ausha Abella was informed that the remainder of the evaluation will be completed by another provider, this initial triage assessment does not replace that evaluation, and the importance of remaining in the ED until their evaluation is complete.   Chuck Hint, PA-C 11/13/22 1300

## 2022-11-14 LAB — URINALYSIS, ROUTINE W REFLEX MICROSCOPIC
Bacteria, UA: NONE SEEN
Bilirubin Urine: NEGATIVE
Glucose, UA: NEGATIVE mg/dL
Hgb urine dipstick: NEGATIVE
Ketones, ur: 5 mg/dL — AB
Leukocytes,Ua: NEGATIVE
Nitrite: NEGATIVE
Protein, ur: NEGATIVE mg/dL
Specific Gravity, Urine: 1.012 (ref 1.005–1.030)
pH: 7 (ref 5.0–8.0)

## 2022-11-14 MED ORDER — SODIUM CHLORIDE 0.9 % IV BOLUS
500.0000 mL | Freq: Once | INTRAVENOUS | Status: AC
  Administered 2022-11-14: 500 mL via INTRAVENOUS

## 2022-11-14 NOTE — ED Notes (Signed)
Gave pt water 

## 2022-11-15 ENCOUNTER — Other Ambulatory Visit: Payer: Self-pay

## 2022-11-15 ENCOUNTER — Telehealth: Payer: Self-pay

## 2022-11-15 DIAGNOSIS — C250 Malignant neoplasm of head of pancreas: Secondary | ICD-10-CM

## 2022-11-15 NOTE — Progress Notes (Signed)
Lab orders entered for [facility] visit.  

## 2022-11-15 NOTE — Progress Notes (Signed)
Symptom Management Consult note [place]    Patient Care Team: [name], MD as PCP - General (Internal Medicine) [name], MD as PCP - Cardiology (Cardiology) Truitt Merle, MD as Consulting Physician (Oncology)    Name of the patient: Miranda Francis  [id number]  1946/07/18   Date of visit: 11/16/2022   Chief Complaint/Reason for visit: diarrhea and nausea   Current Therapy: FOLFIRI (liposomal irinotecan/5FU)   Last treatment:  Day 1   Cycle 3 on 11/06/22   ASSESSMENT & PLAN: Patient is a 77 y.o. 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 Burr Medico.  I have viewed most recent oncology note, ED note, and lab work.    #)Stage IB pancreatic cancer  -Patient deferred her next treatment that was scheduled for 01/10 as she needs more time to recover. Treatment date changed by oncologist. - Next appointment with oncologist is 12/06/22    #)Symptom management: Nausea, vomiting, diarrhea -Chart review shows she was seen in the ED 11/13/22 for these symptoms. She was given LR and droperidol. Also had covid/flu/RSV testing that was all negative and labs were checked. -CBC without leukocytosis, hemoglobin consistent with baseline. CMP similar to x  3 days ago, has mild hypocalcemia 8.4 otherwise no significant electrolyte derangement, no renal insufficieny.  -Patient without pain or nausea. Abdominal exam is benign. She received 1 liter of IVF in clinic. -Prescription for lomotil sent to pharmacy for diarrhea management. Low suspicion for viral pathogen or c diff as diarrhea is known side effect of treatment.  -She will RTC for IVF next week. If she is continuing to have diarrhea will recheck labs and consider stool studies. Discussed the importance of hydration and advancing diet as tolerated. Patient given Pedialyte powder samples.   Strict ED precautions discussed should symptoms worsen.  Heme/Onc History: Oncology  History Overview Note   Cancer Staging  Pancreatic cancer [facility]) Staging form: Exocrine Pancreas, AJCC 8th Edition - Clinical stage from 01/18/2022: Stage IB (cT2, cN0, cM0) - Signed by Truitt Merle, MD on 01/28/2022    Pancreatic cancer ([place])  01/16/2022 Imaging   CLINICAL DATA:  Jaundice   EXAM: CT ABDOMEN AND PELVIS WITH CONTRAST  IMPRESSION: 1. Marked intra and extrahepatic biliary dilatation with marked dilatation of pancreatic duct and distended gallbladder. There is significant atrophy of the body and tail of pancreas. 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 2. Diverticular disease of the colon without acute wall thickening 3. Mild endometrial thickening up to 9 mm, recommend nonemergent pelvic ultrasound for further evaluation   01/17/2022 Imaging   EXAM: MRI ABDOMEN WITHOUT AND WITH CONTRAST (INCLUDING MRCP)  IMPRESSION: 1. Obstructing mass within the pancreatic head with an intraluminal component demonstrating low level enhancement. 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 2. This mass causes significant extrinsic narrowing at the portal/superior mesenteric vein confluence as well as marked intra and extrahepatic biliary dilatation. 3. No distant metastases identified. 4. Endoscopy for endoscopic ultrasound, tissue sampling and biliary decompression recommended.   01/18/2022 Cancer Staging   Staging form: Exocrine Pancreas, AJCC 8th Edition - Clinical stage from 01/18/2022: Stage IB (cT2, cN0, cM0) - Signed by Truitt Merle, MD on 01/28/2022 Stage prefix: Initial diagnosis  Total positive nodes: 0   01/18/2022 Initial Biopsy   FINAL MICROSCOPIC DIAGNOSIS:  A. PANCREAS, HEAD,  FINE NEEDLE ASPIRATION:  - Malignant cells present consistent with adenocarcinoma   01/24/2022 Imaging   EXAM: CT CHEST WITHOUT CONTRAST  IMPRESSION: 1. No evidence of metastatic disease in the chest. 2. Atelectasis or consolidation in the right lower lung. 3. Healing right rib fractures. 4. Mass in the head of the pancreas. See previous report of CT abdomen and pelvis 01/16/2022. Interval placement of the biliary stent.   01/28/2022 Initial Diagnosis   Pancreatic cancer ([place])   02/16/2022 - 06/28/2022 Chemotherapy   Patient is on Treatment Plan : PANCREATIC Abraxane / Gemcitabine D1,8 q21d     02/16/2022 Genetic Testing   Negative genetic testing on the CancerNext-Expanded+RNAinsight panel.  The report date is February 16, 2022.  The CancerNext-Expanded gene panel offered by [facility] and includes sequencing and rearrangement analysis for the following 77 genes: AIP, ALK, APC*, ATM*, AXIN2, BAP1, BARD1, BLM, BMPR1A, BRCA1*, BRCA2*, BRIP1*, CDC73, CDH1*, CDK4, CDKN1B, CDKN2A, CHEK2*, CTNNA1, DICER1, FANCC, FH, FLCN, GALNT12, KIF1B, LZTR1, MAX, MEN1, MET, MLH1*, MSH2*, MSH3, MSH6*, MUTYH*, NBN, NF1*, NF2, NTHL1, PALB2*, PHOX2B, PMS2*, POT1, PRKAR1A, PTCH1, PTEN*, RAD51C*, RAD51D*, RB1, RECQL, RET, SDHA, SDHAF2, SDHB, SDHC, SDHD, SMAD4, SMARCA4, SMARCB1, SMARCE1, STK11, SUFU, TMEM127, TP53*, TSC1, TSC2, VHL and XRCC2 (sequencing and deletion/duplication); EGFR, EGLN1, HOXB13, KIT, MITF, PDGFRA, POLD1, and POLE (sequencing only); EPCAM and GREM1 (deletion/duplication only). DNA and RNA analyses performed for * genes.    06/14/2022 - 09/19/2022 Chemotherapy   Patient is on Treatment Plan : PANCREATIC Abraxane D1,8,15 + Gemcitabine D1,8,15 q28d     10/11/2022 -  Chemotherapy   Patient is on Treatment Plan : PANCREAS Liposomal Irinotecan + Leucovorin + 5-FU IVCI q14d         Interval history-: Miranda Francis is a 77 y.o. female with oncologic history as above presenting [facility] with chief  complaint of diarrhea x 1 week. Patient presents unaccompanied to clinic today.  Patient reports diarrhea started after her pump stop. She reports over 10 episodes of liquid brown stool in the last 24 hours. She took Imodium yesterday and it slightly slowed down diarrhea although not much she states.She states her nausea has resolved. She has decreased appetite and PO intake. She estimates 20 oz of fluid intake daily x the last 3 days. She denies any associated abdominal pain. She denies any sick contacts or suspicious food intake. Denies fever, chills, back pain, urinary symptoms, blood in stool. Patient went to the ED on 11/13/22 for similar symptoms and was discharged home.      ROS  All other systems are reviewed and are negative for acute change except as noted in the HPI.    Allergies  Allergen Reactions   Irinotecan Liposome Other (See Comments)    Started medication at 12:00 pm around 12:11 noticed patient had a grimace on her face. Noted to have lower back pain and Bilateral upper extremity pain. Chemo was paused and fluids given at 12:13, Pepcid 20 mg IVPB given at 1215. Patient stated that the pain has gone away totally around 12:22. 12:33 ok to restart treatment per Dr. Burr Medico. Patient tolerated the rest of the treatment with no issues       Past Medical History:  Diagnosis Date   Blood transfusion    Diabetes mellitus    Family history of breast cancer    Family history of colon cancer    Family  history of prostate cancer    Heart murmur    Hypertension    pancreatic ca 12/2021     Past Surgical History:  Procedure Laterality Date   BILIARY STENT PLACEMENT  01/18/2022   Procedure: BILIARY STENT PLACEMENT;  Surgeon: Carol Ada, MD;  Location: [place];  Service: Gastroenterology;;   DILATION AND CURETTAGE OF UTERUS     ERCP N/A 01/18/2022   Procedure: ENDOSCOPIC RETROGRADE CHOLANGIOPANCREATOGRAPHY (ERCP);  Surgeon: Carol Ada, MD;  Location: [place];   Service: Gastroenterology;  Laterality: N/A;   ESOPHAGOGASTRODUODENOSCOPY N/A 01/18/2022   Procedure: ESOPHAGOGASTRODUODENOSCOPY (EGD);  Surgeon: Carol Ada, MD;  Location: [place];  Service: Gastroenterology;  Laterality: N/A;   EUS  01/18/2022   Procedure: UPPER ENDOSCOPIC ULTRASOUND (EUS) LINEAR;  Surgeon: Carol Ada, MD;  Location: [facility] ENDOSCOPY;  Service: Gastroenterology;;   FINE NEEDLE ASPIRATION  01/18/2022   Procedure: FINE NEEDLE ASPIRATION (FNA) LINEAR;  Surgeon: Carol Ada, MD;  Location: [place];  Service: Gastroenterology;;   IR IMAGING GUIDED PORT INSERTION  02/15/2022   NO PAST SURGERIES     SPHINCTEROTOMY  01/18/2022   Procedure: Joan Mayans;  Surgeon: Carol Ada, MD;  Location[facility] END[facility]COPY;  Service: Gastroenterology;;    Social History   Socioeconomic History   Marital status: Married    Spouse name: Not on file   Number of children: 2   Years of education: Not on file   Highest education level: Not on file  Occupational History   Not on file  Tobacco Use   Smoking status: Never   Smokeless tobacco: Never  Substance and Sexual Activity   Alcohol use: Never   Drug use: Never   Sexual activity: Not on file  Other Topics Concern   Not on file  Social History Narrative   Not on file   Social Determinants of Health   Financial Resource Strain: Not on file  Food Insecurity: Not on file  Transportation Needs: Not on file  Physical Activity: Not on file  Stress: Not on file  Social Connections: Not on file  Intimate Partner Violence: Not on file    Family History  Problem Relation Age of Onset   Diabetes type II Mother    Stroke Father    Diabetes type II Sister    Colon cancer Sister 56   Colon cancer Brother 8   Breast cancer Maternal Aunt 90   Breast cancer Maternal Aunt 94   Prostate cancer Maternal Uncle    Heart attack Maternal Grandmother      Current Outpatient Medications:    diphenoxylate-atropine (LOMOTIL) 2.5-0.025 MG  tablet, Take 1 tablet by mouth 4 (four) times daily as needed for diarrhea or loose stools., Disp: 30 tablet, Rfl: 0   acetaminophen (TYLENOL) 500 MG tablet, Take 1,000 mg by mouth every 6 (six) hours as needed for moderate pain or headache., Disp: , Rfl:    amLODipine (NORVASC) 5 MG tablet, Take 5 mg by mouth daily., Disp: , Rfl:    Cholecalciferol (QC VITAMIN D3) 50 MCG (2000 UT) TABS, Take 2,000 Units by mouth daily., Disp: , Rfl:    Continuous Blood Gluc Sensor (FREESTYLE LIBRE 2 SENSOR) MISC, USE 1 SENSOR AND CHANGE EVERY 14 DAYS AS DIRECTED., Disp: , Rfl:    dexamethasone (DECADRON) 4 MG tablet, Take 2 tablets (8 mg total) by mouth daily. Start the day after irinotecan chemotherapy for 2 days. Take with food., Disp: 8 tablet, Rfl: 5   gabapentin (NEURONTIN) 100 MG capsule, Take 2-3  capsules (200-300 mg total) by mouth at bedtime., Disp: 90 capsule, Rfl: 0   KLOR-CON M20 20 MEQ tablet, Take 20 mEq by mouth 2 (two) times daily., Disp: , Rfl:    lidocaine-prilocaine (EMLA) cream, Apply 1 application topically as needed., Disp: 30 g, Rfl: 1   losartan (COZAAR) 100 MG tablet, Take 100 mg by mouth daily., Disp: , Rfl:    mirtazapine (REMERON) 7.5 MG tablet, Take 1 tablet (7.5 mg total) by mouth at bedtime., Disp: 30 tablet, Rfl: 0   NOVOLOG FLEXPEN 100 UNIT/ML FlexPen, Inject 15 Units into the skin 2 (two) times daily., Disp: , Rfl:    ondansetron (ZOFRAN) 8 MG tablet, Take 1 tablet (8 mg total) by mouth every 8 (eight) hours as needed for nausea or vomiting. Start on the third day after irinotecan, Disp: 30 tablet, Rfl: 1   oxyCODONE (OXY IR/ROXICODONE) 5 MG immediate release tablet, Take 1 tablet (5 mg total) by mouth every 3 (three) hours as needed for severe pain., Disp: 20 tablet, Rfl: 0   polyethylene glycol (MIRALAX / GLYCOLAX) 17 g packet, Take 17 g by mouth daily., Disp: 14 each, Rfl: 0   prochlorperazine (COMPAZINE) 10 MG tablet, Take 1 tablet (10 mg total) by mouth every 6 (six) hours as  needed for nausea or vomiting., Disp: 30 tablet, Rfl: 1   zolpidem (AMBIEN) 10 MG tablet, Take 10 mg by mouth at bedtime as needed., Disp: , Rfl:   PHYSICAL EXAM: ECOG FS:1 - Symptomatic but completely ambulatory    Vitals:   11/16/22 1049  BP: 134/85  Pulse: 98  Resp: 16  Temp: 98.2 F (36.8 C)  TempSrc: Oral  SpO2: 100%  Weight: 135 lb 6.4 oz (61.4 kg)   Physical Exam Vitals and nursing note reviewed.  Constitutional:      Appearance: She is well-developed. She is not ill-appearing or toxic-appearing.  HENT:     Head: Normocephalic.     Nose: Nose normal.  Eyes:     Conjunctiva/sclera: Conjunctivae normal.  Neck:     Vascular: No JVD.  Cardiovascular:     Rate and Rhythm: Normal rate and regular rhythm.     Pulses: Normal pulses.     Heart sounds: Normal heart sounds.  Pulmonary:     Effort: Pulmonary effort is normal.     Breath sounds: Normal breath sounds.  Abdominal:     General: Bowel sounds are normal. There is no distension.     Palpations: There is no mass.     Tenderness: There is no abdominal tenderness. There is no guarding or rebound.     Hernia: No hernia is present.  Musculoskeletal:     Cervical back: Normal range of motion.  Skin:    General: Skin is warm and dry.  Neurological:     Mental Status: She is oriented to person, place, and time.        LABORATORY DATA: I have reviewed the data as listed    Latest Ref Rng & Units 11/16/2022   10:44 AM 11/13/2022    1:29 PM 11/06/2022    8:32 AM  CBC  WBC 4.0 - 10.5 K/uL 5.1  6.3  5.8   Hemoglobin 12.0 - 15.0 g/dL 10.0  10.5  9.4   Hematocrit 36.0 - 46.0 % 29.5  32.6  28.8   Platelets 150 - 400 K/uL 155  140  166         Latest Ref Rng & Units 11/16/2022  10:44 AM 11/13/2022    1:29 PM 11/06/2022    8:32 AM  CMP  Glucose 70 - 99 mg/dL 180  173  163   BUN 8 - 23 mg/dL _0 Creatinine 0.44 - 1.00 mg/dL 0.67  0.65  0.62   Sodium 135 - 145 mmol/L 136  135  138   Potassium 3.5 - 5.1  mmol/L 3.7  3.7  4.0   Chloride 98 - 111 mmol/L 108  102  109   CO2 22 - 32 mmol/L _1 Calcium 8.9 - 10.3 mg/dL 8.4  8.4  9.0   Total Protein 6.5 - 8.1 g/dL 5.4  6.2  6.3   Total Bilirubin 0.3 - 1.2 mg/dL 0.4  0.6  0.3   Alkaline Phos 38 - 126 U/L 191  184  177   AST 15 - 41 U/L _2 ALT 0 - 44 U/L _3 RADIOGRAPHIC STUDIES (from last 24 hours if applicable) I have personally reviewed the radiological images as listed and agreed with the findings in the report. No results found.      Visit Diagnosis: 1. Diarrhea, unspecified type   2. Malignant neoplasm of head of pancreas ([place])      No orders of the defined types were placed in this encounter.   All questions were answered. The patient knows to call the clinic with any problems, questions or concerns. No barriers to learning was detected.  I have spent a total of 30 minutes minutes of face-to-face and non-face-to-face time, preparing to see the patient, obtaining and/or reviewing separately obtained history, performing a medically appropriate examination, counseling and educating the patient, ordering tests, documenting clinical information in the electronic health record, and care coordination (communications with other health care professionals or caregivers).    Thank you for allowing me to participate in the care of this patient.    Barrie Folk, PA-C Department of Hematology/Oncology [facility] Phone: [phone]  [phone]    11/16/2022 2:50 PM

## 2022-11-15 NOTE — Telephone Encounter (Signed)
Patient called in wanted to cancel her next infusion due to have diarrhea and vomiting since her last treatment. Stated she is having 3 to 5 episodes a day and vomiting. She went to ER on Tuesday evening for the symptoms. They gave her something for the vomiting but couldn't treat the diarrhea. She has not been vomiting since going but still has the diarrhea. Sent a message to [facility] to see if we can get her an appointment for Friday for fluids.

## 2022-11-16 ENCOUNTER — Other Ambulatory Visit (HOSPITAL_BASED_OUTPATIENT_CLINIC_OR_DEPARTMENT_OTHER): Payer: Self-pay

## 2022-11-16 ENCOUNTER — Other Ambulatory Visit: Payer: Self-pay

## 2022-11-16 ENCOUNTER — Inpatient Hospital Stay: Payer: Medicare HMO

## 2022-11-16 ENCOUNTER — Inpatient Hospital Stay: Payer: Medicare HMO | Attending: Physician Assistant | Admitting: Physician Assistant

## 2022-11-16 VITALS — BP 125/78 | HR 63 | Resp 16

## 2022-11-16 VITALS — BP 134/85 | HR 98 | Temp 98.2°F | Resp 16 | Wt 135.4 lb

## 2022-11-16 DIAGNOSIS — R197 Diarrhea, unspecified: Secondary | ICD-10-CM | POA: Diagnosis not present

## 2022-11-16 DIAGNOSIS — Z5111 Encounter for antineoplastic chemotherapy: Secondary | ICD-10-CM | POA: Diagnosis not present

## 2022-11-16 DIAGNOSIS — C250 Malignant neoplasm of head of pancreas: Secondary | ICD-10-CM

## 2022-11-16 DIAGNOSIS — Z794 Long term (current) use of insulin: Secondary | ICD-10-CM | POA: Diagnosis not present

## 2022-11-16 DIAGNOSIS — Z95828 Presence of other vascular implants and grafts: Secondary | ICD-10-CM

## 2022-11-16 DIAGNOSIS — Z79899 Other long term (current) drug therapy: Secondary | ICD-10-CM | POA: Diagnosis not present

## 2022-11-16 DIAGNOSIS — G620 Drug-induced polyneuropathy: Secondary | ICD-10-CM | POA: Insufficient documentation

## 2022-11-16 DIAGNOSIS — E1142 Type 2 diabetes mellitus with diabetic polyneuropathy: Secondary | ICD-10-CM | POA: Insufficient documentation

## 2022-11-16 DIAGNOSIS — C787 Secondary malignant neoplasm of liver and intrahepatic bile duct: Secondary | ICD-10-CM | POA: Insufficient documentation

## 2022-11-16 LAB — CBC WITH DIFFERENTIAL (CANCER CENTER ONLY)
Abs Immature Granulocytes: 0.01 10*3/uL (ref 0.00–0.07)
Basophils Absolute: 0 10*3/uL (ref 0.0–0.1)
Basophils Relative: 0 %
Eosinophils Absolute: 0.1 10*3/uL (ref 0.0–0.5)
Eosinophils Relative: 2 %
HCT: 29.5 % — ABNORMAL LOW (ref 36.0–46.0)
Hemoglobin: 10 g/dL — ABNORMAL LOW (ref 12.0–15.0)
Immature Granulocytes: 0 %
Lymphocytes Relative: 38 %
Lymphs Abs: 1.9 10*3/uL (ref 0.7–4.0)
MCH: 30.3 pg (ref 26.0–34.0)
MCHC: 33.9 g/dL (ref 30.0–36.0)
MCV: 89.4 fL (ref 80.0–100.0)
Monocytes Absolute: 0.4 10*3/uL (ref 0.1–1.0)
Monocytes Relative: 7 %
Neutro Abs: 2.7 10*3/uL (ref 1.7–7.7)
Neutrophils Relative %: 53 %
Platelet Count: 155 10*3/uL (ref 150–400)
RBC: 3.3 MIL/uL — ABNORMAL LOW (ref 3.87–5.11)
RDW: 19.9 % — ABNORMAL HIGH (ref 11.5–15.5)
WBC Count: 5.1 10*3/uL (ref 4.0–10.5)
nRBC: 0 % (ref 0.0–0.2)

## 2022-11-16 LAB — CMP (CANCER CENTER ONLY)
ALT: 11 U/L (ref 0–44)
AST: 10 U/L — ABNORMAL LOW (ref 15–41)
Albumin: 3.2 g/dL — ABNORMAL LOW (ref 3.5–5.0)
Alkaline Phosphatase: 191 U/L — ABNORMAL HIGH (ref 38–126)
Anion gap: 6 (ref 5–15)
BUN: 11 mg/dL (ref 8–23)
CO2: 22 mmol/L (ref 22–32)
Calcium: 8.4 mg/dL — ABNORMAL LOW (ref 8.9–10.3)
Chloride: 108 mmol/L (ref 98–111)
Creatinine: 0.67 mg/dL (ref 0.44–1.00)
GFR, Estimated: >60 mL/min (ref >60)
Glucose, Bld: 180 mg/dL — ABNORMAL HIGH (ref 70–99)
Potassium: 3.7 mmol/L (ref 3.5–5.1)
Sodium: 136 mmol/L (ref 135–145)
Total Bilirubin: 0.4 mg/dL (ref 0.3–1.2)
Total Protein: 5.4 g/dL — ABNORMAL LOW (ref 6.5–8.1)

## 2022-11-16 LAB — MAGNESIUM: Magnesium: 1.6 mg/dL — ABNORMAL LOW (ref 1.7–2.4)

## 2022-11-16 MED ORDER — HEPARIN SOD (PORK) LOCK FLUSH 100 UNIT/ML IV SOLN
500.0000 [IU] | Freq: Once | INTRAVENOUS | Status: AC
  Administered 2022-11-16: 500 [IU]

## 2022-11-16 MED ORDER — SODIUM CHLORIDE 0.9 % IV SOLN: Freq: Once | INTRAVENOUS | Status: AC

## 2022-11-16 MED ORDER — SODIUM CHLORIDE 0.9% FLUSH
10.0000 mL | Freq: Once | INTRAVENOUS | Status: AC
  Administered 2022-11-16: 10 mL

## 2022-11-16 MED ORDER — DIPHENOXYLATE-ATROPINE 2.5-0.025 MG PO TABS
1.0000 | ORAL_TABLET | Freq: Four times a day (QID) | ORAL | 0 refills | Status: DC | PRN
  Filled 2022-11-16: qty 30, 8d supply, fill #0

## 2022-11-16 NOTE — Patient Instructions (Signed)

## 2022-11-20 ENCOUNTER — Ambulatory Visit: Payer: Medicare HMO

## 2022-11-21 ENCOUNTER — Inpatient Hospital Stay: Payer: Medicare HMO

## 2022-11-21 ENCOUNTER — Other Ambulatory Visit: Payer: Self-pay

## 2022-11-21 ENCOUNTER — Ambulatory Visit: Payer: Medicare HMO

## 2022-11-21 ENCOUNTER — Other Ambulatory Visit: Payer: Medicare HMO

## 2022-11-21 ENCOUNTER — Ambulatory Visit: Payer: Medicare HMO | Admitting: Hematology

## 2022-11-21 VITALS — BP 135/84 | HR 57 | Temp 98.4°F | Resp 16 | Wt 141.1 lb

## 2022-11-21 DIAGNOSIS — Z5111 Encounter for antineoplastic chemotherapy: Secondary | ICD-10-CM | POA: Diagnosis not present

## 2022-11-21 DIAGNOSIS — C250 Malignant neoplasm of head of pancreas: Secondary | ICD-10-CM

## 2022-11-21 DIAGNOSIS — Z95828 Presence of other vascular implants and grafts: Secondary | ICD-10-CM

## 2022-11-21 MED ORDER — SODIUM CHLORIDE 0.9% FLUSH: 10.0000 mL | Freq: Once | INTRAVENOUS | Status: DC

## 2022-11-21 MED ORDER — SODIUM CHLORIDE 0.9 % IV SOLN: Freq: Once | INTRAVENOUS | Status: AC

## 2022-11-21 MED ORDER — HEPARIN SOD (PORK) LOCK FLUSH 100 UNIT/ML IV SOLN: 500.0000 [IU] | Freq: Once | INTRAVENOUS | Status: DC

## 2022-11-21 NOTE — Patient Instructions (Signed)
Rehydration  Rehydration is the replacement of fluids, salts, and minerals in the body (electrolytes) that are lost during dehydration. Dehydration is when there is not enough water or other fluids in the body. This happens when you lose more fluids than you take in. People who are age 76 or older have a higher risk of dehydration than younger adults. This is because in older age, the body: Is less able to maintain the right amount of water. Does not respond to temperature changes as well. Does not get a sense of thirst as easily or quickly. Other causes include: Not drinking enough fluids. This can occur when you are ill, when you forget to drink, or when you are doing activities that require a lot of energy, especially in hot weather. Conditions that cause loss of water or other fluids. These include diarrhea, vomiting, sweating, or urinating a lot. Other illnesses, such as fever or infection. Certain medicines, such as those that remove excess fluid from the body (diuretics). Symptoms of mild or moderate dehydration may include thirst, dry lips and mouth, and dizziness. Symptoms of severe dehydration may include increased heart rate, confusion, fainting, and not urinating. In severe cases, you may need to get fluids through an IV at the hospital. For mild or moderate cases, you can usually rehydrate at home by drinking certain fluids as told by your health care provider. What are the risks? Rehydration is usually safe. Taking in too much fluid (overhydration) can be a problem but is rare. Overhydration can cause an imbalance of electrolytes in the body, kidney failure, fluid in the lungs, or a decrease in salt (sodium) levels in the body. Supplies needed: You will need an oral rehydration solution (ORS) if your health care provider tells you to use one. This is a drink to treat dehydration. It can be found in pharmacies and retail stores. How to rehydrate Fluids Follow instructions from your  health care provider about what to drink. The kind of fluid and the amount you should drink depend on your condition. In general, you should choose drinks that you prefer. If told by your health care provider, drink an ORS. Make an ORS by following instructions on the package. Start by drinking small amounts, about  cup (120 mL) every 5-10 minutes. Slowly increase how much you drink until you have taken in the amount recommended by your health care provider. Drink enough clear fluids to keep your urine pale yellow. If you were told to drink an ORS, finish it first, then start slowly drinking other clear fluids. Drink fluids such as: Water. This includes sparkling and flavored water. Drinking only water can lead to having too little sodium in your body (hyponatremia). Follow the advice of your health care provider. Water from ice chips you suck on. Fruit juice with water added to it(diluted). Sports drinks. Hot or cold herbal teas. Broth-based soups. Coffee. Milk or milk products. Food Follow instructions from your health care provider about what to eat while you rehydrate. Your health care provider may recommend that you slowly begin eating regular foods in small amounts. Eat foods that contain a healthy balance of electrolytes, such as bananas, oranges, potatoes, tomatoes, and spinach. Avoid foods that are greasy or contain a lot of sugar. In some cases, you may get nutrition through a feeding tube that is passed through your nose and into your stomach (nasogastric tube, or NG tube). This may be done if you have uncontrolled vomiting or diarrhea. Drinks to avoid  Certain   drinks may make dehydration worse. While you rehydrate, avoid drinking alcohol. How to tell if you are recovering from dehydration You may be getting better if: You are urinating more often than before you started rehydrating. Your urine is pale yellow. Your energy level improves. You vomit less often. You have diarrhea  less often. Your appetite improves or returns to normal. You feel less dizzy or light-headed. Your skin tone and color start to look more normal. Follow these instructions at home: Take over-the-counter and prescription medicines only as told by your health care provider. Do not take sodium tablets. Doing this can lead to having too much sodium in your body (hypernatremia). Contact a health care provider if: You continue to have symptoms of mild or moderate dehydration, such as: Thirst. Dry lips. Slightly dry mouth. Dizziness. Dark urine or less urine than usual. Muscle cramps. You continue to vomit or have diarrhea. Get help right away if: You have symptoms of dehydration that get worse. You have a fever. You have a severe headache. You have been vomiting and have problems, such as: Your vomiting gets worse. Your vomit includes blood or green matter (bile). You cannot eat or drink without vomiting. You have problems with urination or bowel movements, such as: Diarrhea that gets worse. Blood in your stool (feces). This may cause stool to look black and tarry. Not urinating, or urinating only a small amount of very dark urine, within 6-8 hours. You have trouble breathing. You have symptoms that get worse with treatment. These symptoms may be an emergency. Get help right away. Call 911. Do not wait to see if the symptoms will go away. Do not drive yourself to the hospital. This information is not intended to replace advice given to you by your health care provider. Make sure you discuss any questions you have with your health care provider. Document Revised: 03/14/2022 Document Reviewed: 03/12/2022 Elsevier Patient Education  2023 Elsevier Inc.  

## 2022-11-23 ENCOUNTER — Other Ambulatory Visit (HOSPITAL_COMMUNITY): Payer: Self-pay | Admitting: Physician Assistant

## 2022-11-23 DIAGNOSIS — C259 Malignant neoplasm of pancreas, unspecified: Secondary | ICD-10-CM

## 2022-11-25 ENCOUNTER — Encounter (HOSPITAL_COMMUNITY): Payer: Self-pay

## 2022-11-25 NOTE — H&P (Signed)
Chief Complaint: Hepatic lesions. Request is for hepatic lesion biopsy.  Referring Physician(s): Burton,Lacie K  Supervising Physician: {Supervising Physician:[id number]}  Patient Status: [facility] - Out-pt  History of Present Illness: Miranda Francis is a 77 y.o. female female outpatient. History of  HTN, DM, pancreatic cancer. Found to have left and right hepatic lesions. Team is requesting liver biopsy for tissue confirmation of metastatic disease  Currently without any significant complaints. Patient alert and laying in bed,calm. Denies any fevers, headache, chest pain, SOB, cough, abdominal pain, nausea, vomiting or bleeding. Return precautions and treatment recommendations and follow-up discussed with the patient *** who is agreeable with the plan.    Past Medical History:  Diagnosis Date   Blood transfusion    Diabetes mellitus    Family history of breast cancer    Family history of colon cancer    Family history of prostate cancer    Heart murmur    Hypertension    pancreatic ca 12/2021    Past Surgical History:  Procedure Laterality Date   BILIARY STENT PLACEMENT  01/18/2022   Procedure: BILIARY STENT PLACEMENT;  Surgeon: Carol Ada, MD;  Location: [place];  Service: Gastroenterology;;   DILATION AND CURETTAGE OF UTERUS     ERCP N/A 01/18/2022   Procedure: ENDOSCOPIC RETROGRADE CHOLANGIOPANCREATOGRAPHY (ERCP);  Surgeon: Carol Ada, MD;  Location: [place];  Service: Gastroenterology;  Laterality: N/A;   ESOPHAGOGASTRODUODENOSCOPY N/A 01/18/2022   Procedure: ESOPHAGOGASTRODUODENOSCOPY (EGD);  Surgeon: Carol Ada, MD;  Location: [place];  Service: Gastroenterology;  Laterality: N/A;   EUS  01/18/2022   Procedure: UPPER ENDOSCOPIC ULTRASOUND (EUS) LINEAR;  Surgeon: Carol Ada, MD;  [facility] ENDOSCOPY;  Service: Gastroenterology;;   FINE NEEDLE ASPIRATION  01/18/2022   Procedure: FINE NEEDLE ASPIRATION (FNA) LINEAR;  Surgeon: Carol Ada, MD;  Location:  [place];  Service: Gastroenterology;;   IR IMAGING GUIDED PORT INSERTION  02/15/2022   NO PAST SURGERIES     SPHINCTEROTOMY  01/18/2022   Procedure: Joan Mayans;  Surgeon: Carol Ada, MD;  Location[facility] ENDOSCOPY;  Service: Gastroenterology;;    Allergies: Irinotecan liposome  Medications: Prior to Admission medications   Medication Sig Start Date End Date Taking? Authorizing Provider  acetaminophen (TYLENOL) 500 MG tablet Take 1,000 mg by mouth every 6 (six) hours as needed for moderate pain or headache.    [provider]  amLODipine (NORVASC) 5 MG tablet Take 5 mg by mouth daily. 10/17/21   [provider]  Cholecalciferol (QC VITAMIN D3) 50 MCG (2000 UT) TABS Take 2,000 Units by mouth daily.    [provider]  Continuous Blood Gluc Sensor (FREESTYLE LIBRE 2 SENSOR) MISC USE 1 SENSOR AND CHANGE EVERY 14 DAYS AS DIRECTED. 09/07/22   [provider]  dexamethasone (DECADRON) 4 MG tablet Take 2 tablets (8 mg total) by mouth daily. Start the day after irinotecan chemotherapy for 2 days. Take with food. 10/08/22   Truitt Merle, MD  diphenoxylate-atropine (LOMOTIL) 2.5-0.025 MG tablet Take 1 tablet by mouth 4 (four) times daily as needed for diarrhea or loose stools. 11/16/22   Walisiewicz, Verline Lema E, PA-C  gabapentin (NEURONTIN) 100 MG capsule Take 2-3 capsules (200-300 mg total) by mouth at bedtime. 11/06/22   Alla Feeling, NP  KLOR-CON M20 20 MEQ tablet Take 20 mEq by mouth 2 (two) times daily. 01/14/22   [provider]  lidocaine-prilocaine (EMLA) cream Apply 1 application topically as needed. 07/12/22   Alla Feeling, NP  losartan (COZAAR) 100 MG tablet  Take 100 mg by mouth daily. 07/12/22   [provider]  mirtazapine (REMERON) 7.5 MG tablet Take 1 tablet (7.5 mg total) by mouth at bedtime. 11/06/22   Alla Feeling, NP  NOVOLOG FLEXPEN 100 UNIT/ML FlexPen Inject 15 Units into the skin 2 (two) times daily. 05/03/22   [provider]  ondansetron (ZOFRAN) 8 MG tablet Take 1 tablet (8 mg total) by mouth every 8 (eight) hours as needed for nausea or vomiting. Start on the third day after irinotecan 10/08/22   Truitt Merle, MD  oxyCODONE (OXY IR/ROXICODONE) 5 MG immediate release tablet Take 1 tablet (5 mg total) by mouth every 3 (three) hours as needed for severe pain. 01/25/22   Shelly Coss, MD  polyethylene glycol (MIRALAX / GLYCOLAX) 17 g packet Take 17 g by mouth daily. 01/26/22   Shelly Coss, MD  prochlorperazine (COMPAZINE) 10 MG tablet Take 1 tablet (10 mg total) by mouth every 6 (six) hours as needed for nausea or vomiting. 10/08/22   Truitt Merle, MD  zolpidem (AMBIEN) 10 MG tablet Take 10 mg by mouth at bedtime as needed. 07/09/22   [provider]     Family History  Problem Relation Age of Onset   Diabetes type II Mother    Stroke Father    Diabetes type II Sister    Colon cancer Sister 36   Colon cancer Brother 22   Breast cancer Maternal Aunt 48   Breast cancer Maternal Aunt 94   Prostate cancer Maternal Uncle    Heart attack Maternal Grandmother     Social History   Socioeconomic History   Marital status: Married    Spouse name: Not on file   Number of children: 2   Years of education: Not on file   Highest education level: Not on file  Occupational History   Not on file  Tobacco Use   Smoking status: Never   Smokeless tobacco: Never  Substance and Sexual Activity   Alcohol use: Never   Drug use: Never   Sexual activity: Not on file  Other Topics Concern   Not on file  Social History Narrative   Not on file   Social Determinants of Health   Financial Resource Strain: Not on file  Food Insecurity: Not on file  Transportation Needs: Not on file  Physical Activity: Not on file  Stress: Not on file  Social Connections: Not on file    ECOG Status: {CHL ONC ECOG ON:[id number]}  Review of Systems: A 12 point ROS discussed and pertinent positives are indicated in  the HPI above.  All other systems are negative.  Review of Systems  Vital Signs: There were no vitals taken for this visit.  Advance Care Plan: {Advance Care GMWN:02725}    Physical Exam  Imaging: No results found.  Labs:  CBC: Recent Labs    10/24/22 0907 11/06/22 0832 11/13/22 1329 11/16/22 1044  WBC 7.2 5.8 6.3 5.1  HGB 8.9* 9.4* 10.5* 10.0*  HCT 27.4* 28.8* 32.6* 29.5*  PLT 176 166 140* 155    COAGS: Recent Labs    01/16/22 2250  INR 1.3*  APTT 36    BMP: Recent Labs    10/24/22 0907 11/06/22 0832 11/13/22 1329 11/16/22 1044  NA 140 138 135 136  K 4.1 4.0 3.7 3.7  CL 109 109 102 108  CO2 '27 24 24 22  '$ GLUCOSE 180* 163* 173* 180*  BUN '15 12 13 11  '$ CALCIUM 9.3 9.0 8.4*  8.4*  CREATININE 0.57 0.62 0.65 0.67  GFRNONAA >60 >60 >60 >60    LIVER FUNCTION TESTS: Recent Labs    10/24/22 0907 11/06/22 0832 11/13/22 1329 11/16/22 1044  BILITOT 0.3 0.3 0.6 0.4  AST 16 17 14* 10*  ALT '11 12 20 11  '$ ALKPHOS 190* 177* 184* 191*  PROT 6.3* 6.3* 6.2* 5.4*  ALBUMIN 3.4* 3.4* 3.0* 3.2*     Assessment and Plan:  77 y.o. female outpatient. History of  HTN, DM, pancreatic cancer. Found to have left and right hepatic lesions. Team is requesting liver biopsy for tissue confirmation of metastatic disease.   CT CAP from 11.13.23 reads Interval enlargement of 2 hypoenhancing lesions within the LEFT and RIGHT hepatic lobes. 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 *** All labs and medications are within acceptable parameters. No pertinent allergies.   Risks and benefits of hepatic biopsy was discussed with the patient and/or patient's family including, but not limited to bleeding, infection, damage to adjacent structures or low yield requiring additional tests.  All of the questions were answered and there is agreement to proceed.  Consent signed and in chart.   Thank you for this interesting consult.  I greatly enjoyed meeting Cherrie Franca and  look forward to participating in their care.  A copy of this report was sent to the requesting provider on this date.  Electronically Signed: Jacqualine Mau, NP 11/25/2022, 8:01 PM   I spent a total of  30 Minutes   in face to face in clinical consultation, greater than 50% of which was counseling/coordinating care for hepatic lesion biopsy.

## 2022-11-26 ENCOUNTER — Ambulatory Visit (HOSPITAL_COMMUNITY)
Admission: RE | Admit: 2022-11-26 | Discharge: 2022-11-26 | Disposition: A | Discharge status: Home / Self Care | Payer: Medicare HMO | Source: Ambulatory Visit | Attending: Nurse Practitioner | Admitting: Nurse Practitioner

## 2022-11-26 DIAGNOSIS — E119 Type 2 diabetes mellitus without complications: Secondary | ICD-10-CM | POA: Insufficient documentation

## 2022-11-26 DIAGNOSIS — C787 Secondary malignant neoplasm of liver and intrahepatic bile duct: Secondary | ICD-10-CM | POA: Diagnosis not present

## 2022-11-26 DIAGNOSIS — I10 Essential (primary) hypertension: Secondary | ICD-10-CM | POA: Diagnosis not present

## 2022-11-26 DIAGNOSIS — Z8249 Family history of ischemic heart disease and other diseases of the circulatory system: Secondary | ICD-10-CM | POA: Diagnosis not present

## 2022-11-26 DIAGNOSIS — C250 Malignant neoplasm of head of pancreas: Secondary | ICD-10-CM

## 2022-11-26 DIAGNOSIS — Z833 Family history of diabetes mellitus: Secondary | ICD-10-CM | POA: Diagnosis not present

## 2022-11-26 DIAGNOSIS — C259 Malignant neoplasm of pancreas, unspecified: Secondary | ICD-10-CM | POA: Insufficient documentation

## 2022-11-26 DIAGNOSIS — R160 Hepatomegaly, not elsewhere classified: Secondary | ICD-10-CM | POA: Diagnosis present

## 2022-11-26 LAB — CBC
HCT: 28.2 % — ABNORMAL LOW (ref 36.0–46.0)
Hemoglobin: 9.1 g/dL — ABNORMAL LOW (ref 12.0–15.0)
MCH: 29.9 pg (ref 26.0–34.0)
MCHC: 32.3 g/dL (ref 30.0–36.0)
MCV: 92.8 fL (ref 80.0–100.0)
Platelets: 143 10*3/uL — ABNORMAL LOW (ref 150–400)
RBC: 3.04 MIL/uL — ABNORMAL LOW (ref 3.87–5.11)
RDW: 20.2 % — ABNORMAL HIGH (ref 11.5–15.5)
WBC: 5.2 10*3/uL (ref 4.0–10.5)
nRBC: 0 % (ref 0.0–0.2)

## 2022-11-26 LAB — PROTIME-INR
INR: 1.1 (ref 0.8–1.2)
Prothrombin Time: 14.5 seconds (ref 11.4–15.2)

## 2022-11-26 LAB — GLUCOSE, CAPILLARY
Glucose-Capillary: 130 mg/dL — ABNORMAL HIGH (ref 70–99)
Glucose-Capillary: 104 mg/dL — ABNORMAL HIGH (ref 70–99)

## 2022-11-26 MED ORDER — GELATIN ABSORBABLE 12-7 MM EX MISC
1.0000 | Freq: Once | CUTANEOUS | Status: AC
  Administered 2022-11-26: 1 via TOPICAL

## 2022-11-26 MED ORDER — MIDAZOLAM HCL 2 MG/2ML IJ SOLN
INTRAMUSCULAR | Status: AC
  Filled 2022-11-26: qty 4

## 2022-11-26 MED ORDER — LIDOCAINE HCL (PF) 1 % IJ SOLN
10.0000 mL | Freq: Once | INTRAMUSCULAR | Status: AC
  Administered 2022-11-26: 10 mL via INTRADERMAL

## 2022-11-26 MED ORDER — GELATIN ABSORBABLE 12-7 MM EX MISC
CUTANEOUS | Status: AC
  Filled 2022-11-26: qty 1

## 2022-11-26 MED ORDER — HEPARIN SOD (PORK) LOCK FLUSH 100 UNIT/ML IV SOLN
500.0000 [IU] | INTRAVENOUS | Status: AC | PRN
  Administered 2022-11-26: 500 [IU]

## 2022-11-26 MED ORDER — SODIUM CHLORIDE 0.9 % IV SOLN: INTRAVENOUS | Status: DC

## 2022-11-26 MED ORDER — MIDAZOLAM HCL 2 MG/2ML IJ SOLN
INTRAMUSCULAR | Status: AC | PRN
  Administered 2022-11-26: 1 mg via INTRAVENOUS
  Administered 2022-11-26 (×3): .5 mg via INTRAVENOUS
  Administered 2022-11-26: 1 mg via INTRAVENOUS

## 2022-11-26 MED ORDER — FENTANYL CITRATE (PF) 100 MCG/2ML IJ SOLN
INTRAMUSCULAR | Status: AC
  Filled 2022-11-26: qty 4

## 2022-11-26 MED ORDER — FENTANYL CITRATE (PF) 100 MCG/2ML IJ SOLN
INTRAMUSCULAR | Status: AC | PRN
  Administered 2022-11-26 (×4): 25 ug via INTRAVENOUS

## 2022-11-26 MED ORDER — LIDOCAINE HCL (PF) 1 % IJ SOLN
INTRAMUSCULAR | Status: AC
  Filled 2022-11-26: qty 30

## 2022-11-26 NOTE — Sedation Documentation (Signed)
Dr. Maryelizabeth Kaufmann at bedside to discuss case with pt.

## 2022-11-26 NOTE — Procedures (Signed)
Vascular and Interventional Radiology Procedure Note  Patient: Korine Winton DOB: [birth year] Medical Record Number: [id number] Note Date/Time: 11/26/22 2:29 PM   Performing Physician: Michaelle Birks, MD Assistant(s): None  Diagnosis: Hx of pancreatic CA. Liver Mass   Procedure: LIVER MASS BIOPSY  Anesthesia: Conscious Sedation Complications: None Estimated Blood Loss: Minimal Specimens: Sent for Pathology  Findings:  Successful Ultrasound-guided biopsy of LEFT hepatic lobe mass. A total of 3 samples were obtained. Hemostasis of the tract was achieved using Gelfoam Slurry Embolization.  Plan: Bed rest for 2 hours.  See detailed procedure note with images in PACS. The patient tolerated the procedure well without incident or complication and was returned to Recovery in stable condition.    Michaelle Birks, MD Vascular and Interventional Radiology [facility] Radiology   Pager. [place]

## 2022-11-26 NOTE — Progress Notes (Addendum)
Patient wanted to know if her port could be accessed instead of putting in a PIV. Spoke with [place], [place] who stated that was ok and that she would put the order in. Awaiting IV team.

## 2022-11-27 LAB — SURGICAL PATHOLOGY

## 2022-12-05 MED FILL — Fosaprepitant Dimeglumine For IV Infusion 150 MG (Base Eq): INTRAVENOUS | Qty: 5 | Status: AC

## 2022-12-05 MED FILL — Dexamethasone Sodium Phosphate Inj 100 MG/10ML: INTRAMUSCULAR | Qty: 1 | Status: AC

## 2022-12-05 NOTE — Progress Notes (Unsigned)
[place]   [phone] Fax:(336) [phone]   Clinic Follow up Note   Patient Care Team: [name], MD as PCP - General (Internal Medicine) [name], MD as PCP - Cardiology (Cardiology) Truitt Merle, MD as Consulting Physician (Oncology)  Date of Service:  12/06/2022  CHIEF COMPLAINT: f/u of pancreatic cancer    CURRENT THERAPY: Second line FOLFIRI (liposomal irinotecan/5FU)    ASSESSMENT:  Miranda Francis is a 77 y.o. 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 presented to PCP with jaundice for one month. Also complained of epigastric pain, poor appetite, weight loss, and light-colored stool.  -she began neoadjuvant gemcitabine alone on 02/16/22, abraxane was added with C2 (03/08/22).   -abdomen MRI on 06/19/22 showed: subtle liver lesions are stable from MRI in 01/2022 (not reported then); no change in pancreatic mass.  -Dr. Zenia Resides feels she is not a good surgical candidate due to he indeterminate liver lesions and high CA19.9 over one thousand. -restaging CT CAP on 09/24/22 showed: enlargement of two liver lesions; stable pancreatic head mass; no adenopathy.  -I changed her treatment to second line FOLFIRI (5FU pump / liposomal irinotecan) on 10/09/22  -Her recent liver biopsy confirmed metastatic pancreatic cancer, I reviewed with patient. -She had a severe diarrhea, nausea, fatigue after last cycle chemotherapy, was evaluated in the ED in office.  She has recovered now -Will reduce her chemotherapy by 15% due to her poor tolerance -Plan to repeat a CT scan after cycle 5 of 6.  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 She has diabetes also  -overall mild and stable        PLAN: -lab reviewed - Discuss Liver Biopsy results which confirmed metastasis from pancreatic cancer -Proceed with C4 liposomal irinotecan/5FU  at reduce dose due  to poor tolerance from C3 , severe diarrhea and fatigue. -order CT scan to be done in 3 to 4 weeks -lab,f/u and Hydration 1/29   SUMMARY OF ONCOLOGIC HISTORY: Oncology History Overview Note   Cancer Staging  Pancreatic cancer [facility]) Staging form: Exocrine Pancreas, AJCC 8th Edition - Clinical stage from 01/18/2022: Stage IB (cT2, cN0, cM0) - Signed by Truitt Merle, MD on 01/28/2022    Pancreatic cancer ([place])  01/16/2022 Imaging   CLINICAL DATA:  Jaundice   EXAM: CT ABDOMEN AND PELVIS WITH CONTRAST  IMPRESSION: 1. Marked intra and extrahepatic biliary dilatation with marked dilatation of pancreatic duct and distended gallbladder. There is significant atrophy of the body and tail of pancreas. 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 2. Diverticular disease of the colon without acute wall thickening 3. Mild endometrial thickening up to 9 mm, recommend nonemergent pelvic ultrasound for further evaluation   01/17/2022 Imaging   EXAM: MRI ABDOMEN WITHOUT AND WITH CONTRAST (INCLUDING MRCP)  IMPRESSION: 1. Obstructing mass within the pancreatic head with an intraluminal component demonstrating low level enhancement. 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 2. This mass causes significant extrinsic narrowing at the portal/superior mesenteric vein confluence as well as marked intra and extrahepatic biliary dilatation. 3. No distant metastases identified. 4. Endoscopy for endoscopic ultrasound, tissue sampling and biliary decompression recommended.   01/18/2022  Cancer Staging   Staging form: Exocrine Pancreas, AJCC 8th Edition - Clinical stage from 01/18/2022: Stage IB (cT2, cN0, cM0) -  Signed by Truitt Merle, MD on 01/28/2022 Stage prefix: Initial diagnosis Total positive nodes: 0   01/18/2022 Initial Biopsy   FINAL MICROSCOPIC DIAGNOSIS:  A. PANCREAS, HEAD, FINE NEEDLE ASPIRATION:  - Malignant cells present consistent with adenocarcinoma   01/24/2022 Imaging   EXAM: CT CHEST WITHOUT CONTRAST  IMPRESSION: 1. No evidence of metastatic disease in the chest. 2. Atelectasis or consolidation in the right lower lung. 3. Healing right rib fractures. 4. Mass in the head of the pancreas. See previous report of CT abdomen and pelvis 01/16/2022. Interval placement of the biliary stent.   01/28/2022 Initial Diagnosis   Pancreatic cancer ([place])   02/16/2022 - 06/28/2022 Chemotherapy   Patient is on Treatment Plan : PANCREATIC Abraxane / Gemcitabine D1,8 q21d     02/16/2022 Genetic Testing   Negative genetic testing on the CancerNext-Expanded+RNAinsight panel.  The report date is February 16, 2022.  The CancerNext-Expanded gene panel offered by [facility] and includes sequencing and rearrangement analysis for the following 77 genes: AIP, ALK, APC*, ATM*, AXIN2, BAP1, BARD1, BLM, BMPR1A, BRCA1*, BRCA2*, BRIP1*, CDC73, CDH1*, CDK4, CDKN1B, CDKN2A, CHEK2*, CTNNA1, DICER1, FANCC, FH, FLCN, GALNT12, KIF1B, LZTR1, MAX, MEN1, MET, MLH1*, MSH2*, MSH3, MSH6*, MUTYH*, NBN, NF1*, NF2, NTHL1, PALB2*, PHOX2B, PMS2*, POT1, PRKAR1A, PTCH1, PTEN*, RAD51C*, RAD51D*, RB1, RECQL, RET, SDHA, SDHAF2, SDHB, SDHC, SDHD, SMAD4, SMARCA4, SMARCB1, SMARCE1, STK11, SUFU, TMEM127, TP53*, TSC1, TSC2, VHL and XRCC2 (sequencing and deletion/duplication); EGFR, EGLN1, HOXB13, KIT, MITF, PDGFRA, POLD1, and POLE (sequencing only); EPCAM and GREM1 (deletion/duplication only). DNA and RNA analyses performed for * genes.    06/14/2022 - 09/19/2022 Chemotherapy   Patient is on Treatment Plan : PANCREATIC Abraxane D1,8,15 + Gemcitabine D1,8,15 q28d     10/11/2022 -  Chemotherapy   Patient is on Treatment Plan : PANCREAS Liposomal  Irinotecan + Leucovorin + 5-FU IVCI q14d        INTERVAL HISTORY:  Miranda Francis is here for a follow up of  pancreatic cancer  She was last seen by NP Lacie on 11/06/2022 She presents to the clinic accompanied by husband .Pt states the liver biopsy went well, she is not in any pain.Pt states her 3rd cycle of chemo she was very sick, she had diarrhea, nausea and vomiting, lasted a day she was able to come in get fluids. Pt reports of numbness and tingling in her feet.     All other systems were reviewed with the patient and are negative.  MEDICAL HISTORY:  Past Medical History:  Diagnosis Date   Blood transfusion    Diabetes mellitus    Family history of breast cancer    Family history of colon cancer    Family history of prostate cancer    Heart murmur    Hypertension    pancreatic ca 12/2021    SURGICAL HISTORY: Past Surgical History:  Procedure Laterality Date   BILIARY STENT PLACEMENT  01/18/2022   Procedure: BILIARY STENT PLACEMENT;  Surgeon: Carol Ada, MD;  Location: [place];  Service: Gastroenterology;;   DILATION AND CURETTAGE OF UTERUS     ERCP N/A 01/18/2022   Procedure: ENDOSCOPIC RETROGRADE CHOLANGIOPANCREATOGRAPHY (ERCP);  Surgeon: Carol Ada, MD;  Location: [place];  Service: Gastroenterology;  Laterality: N/A;   ESOPHAGOGASTRODUODENOSCOPY N/A 01/18/2022   Procedure: ESOPHAGOGASTRODUODENOSCOPY (EGD);  Surgeon: Carol Ada, MD;  Location: [place];  Service: Gastroenterology;  Laterality: N/A;  EUS  01/18/2022   Procedure: UPPER ENDOSCOPIC ULTRASOUND (EUS) LINEAR;  Surgeon: Carol Ada, MD;  Location[facility] ENDOSCOPY;  Service: Gastroenterology;;   FINE NEEDLE ASPIRATION  01/18/2022   Procedure: FINE NEEDLE ASPIRATION (FNA) LINEAR;  Surgeon: Carol Ada, MD;  Location: [facility] ENDOSCOPY;  Service: Gastroenterology;;   IR IMAGING GUIDED PORT INSERTION  02/15/2022   NO PAST SURGERIES     SPHINCTEROTOMY  01/18/2022   Procedure: Joan [facility]ans;  Surgeon:  Carol Ada, MD;  Location: [place];  Service: Gastroenterology;;    I have reviewed the social history and family history with the patient and they are unchanged from previous note.  ALLERGIES:  is allergic to irinotecan liposome.  MEDICATIONS:  Current Outpatient Medications  Medication Sig Dispense Refill   acetaminophen (TYLENOL) 500 MG tablet Take 1,000 mg by mouth every 6 (six) hours as needed for moderate pain or headache.     amLODipine (NORVASC) 5 MG tablet Take 5 mg by mouth daily.     Cholecalciferol (QC VITAMIN D3) 50 MCG (2000 UT) TABS Take 2,000 Units by mouth daily.     Continuous Blood Gluc Sensor (FREESTYLE LIBRE 2 SENSOR) MISC USE 1 SENSOR AND CHANGE EVERY 14 DAYS AS DIRECTED.     dexamethasone (DECADRON) 4 MG tablet Take 2 tablets (8 mg total) by mouth daily. Start the day after irinotecan chemotherapy for 2 days. Take with food. 8 tablet 5   diphenoxylate-atropine (LOMOTIL) 2.5-0.025 MG tablet Take 1 tablet by mouth 4 (four) times daily as needed for diarrhea or loose stools. 30 tablet 0   gabapentin (NEURONTIN) 100 MG capsule Take 2-3 capsules (200-300 mg total) by mouth at bedtime. 90 capsule 0   KLOR-CON M20 20 MEQ tablet Take 20 mEq by mouth 2 (two) times daily.     lidocaine-prilocaine (EMLA) cream Apply 1 application topically as needed. 30 g 1   losartan (COZAAR) 100 MG tablet Take 100 mg by mouth daily.     mirtazapine (REMERON) 7.5 MG tablet Take 1 tablet (7.5 mg total) by mouth at bedtime. 30 tablet 0   NOVOLOG FLEXPEN 100 UNIT/ML FlexPen Inject 15 Units into the skin 2 (two) times daily.     ondansetron (ZOFRAN) 8 MG tablet Take 1 tablet (8 mg total) by mouth every 8 (eight) hours as needed for nausea or vomiting. Start on the third day after irinotecan 30 tablet 1   oxyCODONE (OXY IR/ROXICODONE) 5 MG immediate release tablet Take 1 tablet (5 mg total) by mouth every 3 (three) hours as needed for severe pain. 20 tablet 0   polyethylene glycol (MIRALAX /  GLYCOLAX) 17 g packet Take 17 g by mouth daily. 14 each 0   prochlorperazine (COMPAZINE) 10 MG tablet Take 1 tablet (10 mg total) by mouth every 6 (six) hours as needed for nausea or vomiting. 30 tablet 1   zolpidem (AMBIEN) 10 MG tablet Take 10 mg by mouth at bedtime as needed.     No current facility-administered medications for this visit.   Facility-Administered Medications Ordered in Other Visits  Medication Dose Route Frequency Provider Last Rate Last Admin   atropine injection 0.4 mg  0.4 mg Intravenous Once Truitt Merle, MD       atropine injection 0.5 mg  0.5 mg Intravenous Once PRN Truitt Merle, MD       fluorouracil (ADRUCIL) 3,450 mg in sodium chloride 0.9 % 81 mL chemo infusion  2,000 mg/m2 (Treatment Plan Recorded) Intravenous 1 day or 1 dose Truitt Merle, MD  heparin lock flush 100 unit/mL  500 Units Intracatheter Once PRN Truitt Merle, MD       irinotecan LIPOSOME (ONIVYDE) 86 mg in sodium chloride 0.9 % 500 mL chemo infusion  50 mg/m2 (Treatment Plan Recorded) Intravenous Once Truitt Merle, MD       leucovorin 692 mg in sodium chloride 0.9 % 250 mL infusion  400 mg/m2 (Treatment Plan Recorded) Intravenous Once Truitt Merle, MD       sodium chloride flush (NS) 0.9 % injection 10 mL  10 mL Intracatheter PRN Truitt Merle, MD        PHYSICAL EXAMINATION: ECOG PERFORMANCE STATUS: 2 - Symptomatic, <50% confined to bed  Vitals:   12/06/22 0850  BP: (!) 144/89  Pulse: 80  Resp: 18  Temp: 98.9 F (37.2 C)  SpO2: 100%   Wt Readings from Last 3 Encounters:  12/06/22 143 lb 6.4 oz (65 kg)  11/26/22 141 lb (64 kg)  11/21/22 141 lb 1.6 oz (64 kg)     GENERAL:alert, no distress and comfortable SKIN: skin color normal, no rashes or significant lesions EYES: normal, Conjunctiva are pink and non-injected, sclera clear  NEURO: alert & oriented x 3 with fluent speech  LABORATORY DATA:  I have reviewed the data as listed    Latest Ref Rng & Units 12/06/2022    8:33 AM 11/26/2022   12:15 PM  11/16/2022   10:44 AM  CBC  WBC 4.0 - 10.5 K/uL 6.5  5.2  5.1   Hemoglobin 12.0 - 15.0 g/dL 9.4  9.1  10.0   Hematocrit 36.0 - 46.0 % 29.0  28.2  29.5   Platelets 150 - 400 K/uL 184  143  155         Latest Ref Rng & Units 12/06/2022    8:33 AM 11/16/2022   10:44 AM 11/13/2022    1:29 PM  CMP  Glucose 70 - 99 mg/dL 131  180  173   BUN 8 - 23 mg/dL '12  11  13   '$ Creatinine 0.44 - 1.00 mg/dL 0.60  0.67  0.65   Sodium 135 - 145 mmol/L 141  136  135   Potassium 3.5 - 5.1 mmol/L 3.8  3.7  3.7   Chloride 98 - 111 mmol/L 110  108  102   CO2 22 - 32 mmol/L '25  22  24   '$ Calcium 8.9 - 10.3 mg/dL 8.8  8.4  8.4   Total Protein 6.5 - 8.1 g/dL 6.1  5.4  6.2   Total Bilirubin 0.3 - 1.2 mg/dL 0.4  0.4  0.6   Alkaline Phos 38 - 126 U/L 282  191  184   AST 15 - 41 U/L '21  10  14   '$ ALT 0 - 44 U/L '15  11  20       '$ RADIOGRAPHIC STUDIES: I have personally reviewed the radiological images as listed and agreed with the findings in the report. No results found.    Orders Placed This Encounter  Procedures   CT CHEST ABDOMEN PELVIS W CONTRAST    Standing Status:   Future    Standing Expiration Date:   12/07/2023    Order Specific Question:   Preferred imaging location?    Answer:   [facility]    Order Specific Question:   Is Oral Contrast requested for this exam?    Answer:   Yes, Per Radiology protocol   CBC with Differential ([place] Only)  Standing Status:   Future    Standing Expiration Date:   12/28/2023   CMP ([facility] only)    Standing Status:   Future    Standing Expiration Date:   12/28/2023   CBC with Differential ([place] Only)    Standing Status:   Future    Standing Expiration Date:   01/11/2024   CMP ([place] only)    Standing Status:   Future    Standing Expiration Date:   01/11/2024   All questions were answered. The patient knows to call the clinic with any problems, questions or concerns. No barriers to learning was detected. The total time  spent in the appointment was 30 minutes.     Truitt Merle, MD 12/06/2022   Felicity Coyer, CMA, am acting as scribe for Truitt Merle, MD.   I have reviewed the above documentation for accuracy and completeness, and I agree with the above.

## 2022-12-05 NOTE — Assessment & Plan Note (Signed)
-  secondary to Abraxane. She has diabetes also  -overall mild and stable

## 2022-12-05 NOTE — Assessment & Plan Note (Signed)
stage IV c(T2, N0, M1) with liver metastasis, MMR normal   -Diagnosed in 01/2022. presented to PCP with jaundice for one month. Also complained of epigastric pain, poor appetite, weight loss, and light-colored stool.  -she began neoadjuvant gemcitabine alone on 02/16/22, abraxane was added with C2 (03/08/22).   -abdomen MRI on 06/19/22 showed: subtle liver lesions are stable from MRI in 01/2022 (not reported then); no change in pancreatic mass.  -Dr. Zenia Resides feels she is not a good surgical candidate due to he indeterminate liver lesions and high CA19.9 over one thousand. -restaging CT CAP on 09/24/22 showed: enlargement of two liver lesions; stable pancreatic head mass; no adenopathy.  -I changed her treatment to second line FOLFIRI (5FU pump / liposomal irinotecan) on 10/09/22  -Her recent liver biopsy confirmed metastatic pancreatic cancer, I reviewed with patient.

## 2022-12-06 ENCOUNTER — Encounter: Payer: Self-pay | Admitting: Hematology

## 2022-12-06 ENCOUNTER — Inpatient Hospital Stay: Payer: Medicare HMO

## 2022-12-06 ENCOUNTER — Inpatient Hospital Stay: Payer: Medicare HMO | Admitting: Hematology

## 2022-12-06 ENCOUNTER — Other Ambulatory Visit: Payer: Self-pay

## 2022-12-06 VITALS — BP 144/89 | HR 80 | Temp 98.9°F | Resp 18 | Ht 62.0 in | Wt 143.4 lb

## 2022-12-06 DIAGNOSIS — C250 Malignant neoplasm of head of pancreas: Secondary | ICD-10-CM

## 2022-12-06 DIAGNOSIS — Z5111 Encounter for antineoplastic chemotherapy: Secondary | ICD-10-CM | POA: Diagnosis not present

## 2022-12-06 DIAGNOSIS — T451X5A Adverse effect of antineoplastic and immunosuppressive drugs, initial encounter: Secondary | ICD-10-CM

## 2022-12-06 DIAGNOSIS — G620 Drug-induced polyneuropathy: Secondary | ICD-10-CM

## 2022-12-06 DIAGNOSIS — Z95828 Presence of other vascular implants and grafts: Secondary | ICD-10-CM

## 2022-12-06 LAB — CMP (CANCER CENTER ONLY)
ALT: 15 U/L (ref 0–44)
AST: 21 U/L (ref 15–41)
Albumin: 3.1 g/dL — ABNORMAL LOW (ref 3.5–5.0)
Alkaline Phosphatase: 282 U/L — ABNORMAL HIGH (ref 38–126)
Anion gap: 6 (ref 5–15)
BUN: 12 mg/dL (ref 8–23)
CO2: 25 mmol/L (ref 22–32)
Calcium: 8.8 mg/dL — ABNORMAL LOW (ref 8.9–10.3)
Chloride: 110 mmol/L (ref 98–111)
Creatinine: 0.6 mg/dL (ref 0.44–1.00)
GFR, Estimated: >60 mL/min (ref >60)
Glucose, Bld: 131 mg/dL — ABNORMAL HIGH (ref 70–99)
Potassium: 3.8 mmol/L (ref 3.5–5.1)
Sodium: 141 mmol/L (ref 135–145)
Total Bilirubin: 0.4 mg/dL (ref 0.3–1.2)
Total Protein: 6.1 g/dL — ABNORMAL LOW (ref 6.5–8.1)

## 2022-12-06 LAB — CBC WITH DIFFERENTIAL (CANCER CENTER ONLY)
Abs Immature Granulocytes: 0.01 10*3/uL (ref 0.00–0.07)
Basophils Absolute: 0.1 10*3/uL (ref 0.0–0.1)
Basophils Relative: 1 %
Eosinophils Absolute: 0.1 10*3/uL (ref 0.0–0.5)
Eosinophils Relative: 2 %
HCT: 29 % — ABNORMAL LOW (ref 36.0–46.0)
Hemoglobin: 9.4 g/dL — ABNORMAL LOW (ref 12.0–15.0)
Immature Granulocytes: 0 %
Lymphocytes Relative: 39 %
Lymphs Abs: 2.5 10*3/uL (ref 0.7–4.0)
MCH: 29.6 pg (ref 26.0–34.0)
MCHC: 32.4 g/dL (ref 30.0–36.0)
MCV: 91.2 fL (ref 80.0–100.0)
Monocytes Absolute: 0.5 10*3/uL (ref 0.1–1.0)
Monocytes Relative: 7 %
Neutro Abs: 3.3 10*3/uL (ref 1.7–7.7)
Neutrophils Relative %: 51 %
Platelet Count: 184 10*3/uL (ref 150–400)
RBC: 3.18 MIL/uL — ABNORMAL LOW (ref 3.87–5.11)
RDW: 20.7 % — ABNORMAL HIGH (ref 11.5–15.5)
WBC Count: 6.5 10*3/uL (ref 4.0–10.5)
nRBC: 0 % (ref 0.0–0.2)

## 2022-12-06 MED ORDER — FAMOTIDINE IN NACL 20-0.9 MG/50ML-% IV SOLN
20.0000 mg | Freq: Once | INTRAVENOUS | Status: AC
  Administered 2022-12-06: 20 mg via INTRAVENOUS
  Filled 2022-12-06: qty 50

## 2022-12-06 MED ORDER — SODIUM CHLORIDE 0.9 % IV SOLN: Freq: Once | INTRAVENOUS | Status: AC

## 2022-12-06 MED ORDER — SODIUM CHLORIDE 0.9% FLUSH: 10.0000 mL | INTRAVENOUS | Status: DC | PRN

## 2022-12-06 MED ORDER — ATROPINE SULFATE 1 MG/ML IV SOLN
0.4000 mg | Freq: Once | INTRAVENOUS | Status: AC
  Administered 2022-12-06: 0.4 mg via INTRAVENOUS
  Filled 2022-12-06: qty 1

## 2022-12-06 MED ORDER — SODIUM CHLORIDE 0.9 % IV SOLN
400.0000 mg/m2 | Freq: Once | INTRAVENOUS | Status: AC
  Administered 2022-12-06: 692 mg via INTRAVENOUS
  Filled 2022-12-06: qty 34.6

## 2022-12-06 MED ORDER — SODIUM CHLORIDE 0.9 % IV SOLN
150.0000 mg | Freq: Once | INTRAVENOUS | Status: AC
  Administered 2022-12-06: 150 mg via INTRAVENOUS
  Filled 2022-12-06: qty 150

## 2022-12-06 MED ORDER — SODIUM CHLORIDE 0.9 % IV SOLN
2000.0000 mg/m2 | INTRAVENOUS | Status: DC
  Administered 2022-12-06: 3450 mg via INTRAVENOUS
  Filled 2022-12-06: qty 69

## 2022-12-06 MED ORDER — PALONOSETRON HCL INJECTION 0.25 MG/5ML
0.2500 mg | Freq: Once | INTRAVENOUS | Status: AC
  Administered 2022-12-06: 0.25 mg via INTRAVENOUS
  Filled 2022-12-06: qty 5

## 2022-12-06 MED ORDER — SODIUM CHLORIDE 0.9% FLUSH
10.0000 mL | Freq: Once | INTRAVENOUS | Status: AC
  Administered 2022-12-06: 10 mL

## 2022-12-06 MED ORDER — HEPARIN SOD (PORK) LOCK FLUSH 100 UNIT/ML IV SOLN: 500.0000 [IU] | Freq: Once | INTRAVENOUS | Status: DC | PRN

## 2022-12-06 MED ORDER — SODIUM CHLORIDE 0.9 % IV SOLN
50.0000 mg/m2 | Freq: Once | INTRAVENOUS | Status: AC
  Administered 2022-12-06: 86 mg via INTRAVENOUS
  Filled 2022-12-06: qty 20

## 2022-12-06 MED ORDER — SODIUM CHLORIDE 0.9 % IV SOLN
10.0000 mg | Freq: Once | INTRAVENOUS | Status: AC
  Administered 2022-12-06: 10 mg via INTRAVENOUS
  Filled 2022-12-06: qty 10

## 2022-12-06 MED ORDER — ATROPINE SULFATE 1 MG/ML IV SOLN: 0.5000 mg | Freq: Once | INTRAVENOUS | Status: AC | PRN

## 2022-12-06 NOTE — Patient Instructions (Addendum)
[place]   Discharge Instructions: Thank you for choosing [facility] to provide your oncology and hematology care.   If you have a lab appointment with the [facility], please go directly to the [facility] and check in at the registration area.   Wear comfortable clothing and clothing appropriate for easy access to any Portacath or PICC line.   We strive to give you quality time with your provider. You may need to reschedule your appointment if you arrive late (15 or more minutes).  Arriving late affects you and other patients whose appointments are after yours.  Also, if you miss three or more appointments without notifying the office, you may be dismissed from the clinic at the provider's discretion.      For prescription refill requests, have your pharmacy contact our office and allow 72 hours for refills to be completed.    Today you received the following chemotherapy and/or immunotherapy agents: irinotecan & fluorouracil       To help prevent nausea and vomiting after your treatment, we encourage you to take your nausea medication as directed.  BELOW ARE SYMPTOMS THAT SHOULD BE REPORTED IMMEDIATELY: *FEVER GREATER THAN 100.4 F (38 C) OR HIGHER *CHILLS OR SWEATING *NAUSEA AND VOMITING THAT IS NOT CONTROLLED WITH YOUR NAUSEA MEDICATION *UNUSUAL SHORTNESS OF BREATH *UNUSUAL BRUISING OR BLEEDING *URINARY PROBLEMS (pain or burning when urinating, or frequent urination) *BOWEL PROBLEMS (unusual diarrhea, constipation, pain near the anus) TENDERNESS IN MOUTH AND THROAT WITH OR WITHOUT PRESENCE OF ULCERS (sore throat, sores in mouth, or a toothache) UNUSUAL RASH, SWELLING OR PAIN  UNUSUAL VAGINAL DISCHARGE OR ITCHING   Items with * indicate a potential emergency and should be followed up as soon as possible or go to the Emergency Department if any problems should occur.  Please show the CHEMOTHERAPY ALERT CARD or IMMUNOTHERAPY  ALERT CARD at check-in to the Emergency Department and triage nurse.  Should you have questions after your visit or need to cancel or reschedule your appointment, please contact [facility]  Dept: [phone]  and follow the prompts.  Office hours are 8:00 a.m. to 4:30 p.m. Monday - Friday. Please note that voicemails left after 4:00 p.m. may not be returned until the following business day.  We are closed weekends and major holidays. You have access to a nurse at all times for urgent questions. Please call the main number to the clinic Dept: [phone] and follow the prompts.   For any non-urgent questions, you may also contact your provider using MyChart. We now offer e-Visits for anyone 51 and older to request care online for non-urgent symptoms. For details visit [email or web address].   Also download the MyChart app! Go to the app store, search "MyChart", open the app, select [facility], and log in with your MyChart username and password.  The chemotherapy medication bag should finish at 46 hours, 96 hours, or 7 days. For example, if your pump is scheduled for 46 hours and it was put on at 4:00 p.m., it should finish at 2:00 p.m. the day it is scheduled to come off regardless of your appointment time.     Estimated time to finish at Saturday, 1/27 @ 11:00 AM.   If the display on your pump reads "Low Volume" and it is beeping, take the batteries out of the pump and come to the cancer center for it to be taken off.   If  the pump alarms go off prior to the pump reading "Low Volume" then call [phone] and someone can assist you.  If the plunger comes out and the chemotherapy medication is leaking out, please use your home chemo spill kit to clean up the spill. Do NOT use paper towels or other household products.  If you have problems or questions regarding your pump, please call either [phone] (24 hours a day) or the cancer center  Monday-Friday 8:00 a.m.- 4:30 p.m. at the clinic number and we will assist you. If you are unable to get assistance, then go to the nearest Emergency Department and ask the staff to contact the IV team for assistance.

## 2022-12-07 ENCOUNTER — Other Ambulatory Visit: Payer: Self-pay

## 2022-12-08 ENCOUNTER — Inpatient Hospital Stay: Payer: Medicare HMO

## 2022-12-08 VITALS — BP 150/84 | HR 50 | Temp 97.2°F | Resp 18

## 2022-12-08 DIAGNOSIS — Z5111 Encounter for antineoplastic chemotherapy: Secondary | ICD-10-CM | POA: Diagnosis not present

## 2022-12-08 DIAGNOSIS — C250 Malignant neoplasm of head of pancreas: Secondary | ICD-10-CM

## 2022-12-08 LAB — CANCER ANTIGEN 19-9: CA 19-9: 37202 U/mL — ABNORMAL HIGH (ref 0–35)

## 2022-12-08 MED ORDER — SODIUM CHLORIDE 0.9% FLUSH
10.0000 mL | INTRAVENOUS | Status: DC | PRN
  Administered 2022-12-08: 10 mL

## 2022-12-08 MED ORDER — HEPARIN SOD (PORK) LOCK FLUSH 100 UNIT/ML IV SOLN
500.0000 [IU] | Freq: Once | INTRAVENOUS | Status: AC | PRN
  Administered 2022-12-08: 500 [IU]

## 2022-12-10 ENCOUNTER — Telehealth: Payer: Self-pay | Admitting: *Deleted

## 2022-12-10 NOTE — Telephone Encounter (Signed)
Notified of message below

## 2022-12-10 NOTE — Telephone Encounter (Signed)
-----  Message from Truitt Merle, MD sent at 12/09/2022  1:55 PM EST ----- Please let pt know that her tumor marker has been going up, concerning for cancer progression, I ordered restaging CT for her a few days ago, please get CT scheduled before next visit in 2 weeks, thanks.  Truitt Merle

## 2022-12-19 ENCOUNTER — Other Ambulatory Visit: Payer: Self-pay

## 2022-12-19 NOTE — Progress Notes (Signed)
In regards to inquiry about viral respiratory panel that was ordered.  Please note patient present with nausea vomiting diarrhea: these are symptoms that can be associated with viral respiratory infection.

## 2022-12-20 ENCOUNTER — Ambulatory Visit: Payer: Medicare HMO

## 2022-12-20 ENCOUNTER — Other Ambulatory Visit: Payer: Medicare HMO

## 2022-12-20 ENCOUNTER — Ambulatory Visit: Payer: Medicare HMO | Admitting: Hematology

## 2022-12-25 ENCOUNTER — Other Ambulatory Visit: Payer: Self-pay

## 2022-12-25 ENCOUNTER — Encounter (HOSPITAL_COMMUNITY): Payer: Self-pay

## 2022-12-25 ENCOUNTER — Encounter: Payer: Self-pay | Admitting: Hematology

## 2022-12-25 ENCOUNTER — Other Ambulatory Visit (HOSPITAL_BASED_OUTPATIENT_CLINIC_OR_DEPARTMENT_OTHER): Payer: Self-pay

## 2022-12-25 ENCOUNTER — Encounter: Payer: Self-pay | Admitting: Nurse Practitioner

## 2022-12-25 ENCOUNTER — Telehealth: Payer: Self-pay

## 2022-12-25 ENCOUNTER — Inpatient Hospital Stay: Payer: Medicare HMO | Attending: Physician Assistant | Admitting: Nurse Practitioner

## 2022-12-25 ENCOUNTER — Other Ambulatory Visit: Payer: Self-pay | Admitting: Hematology

## 2022-12-25 ENCOUNTER — Emergency Department (HOSPITAL_COMMUNITY)
Admission: RE | Admit: 2022-12-25 | Discharge: 2022-12-25 | Disposition: A | Discharge status: Home / Self Care | Payer: Medicare HMO | Source: Ambulatory Visit | Attending: Hematology | Admitting: Hematology

## 2022-12-25 ENCOUNTER — Emergency Department (HOSPITAL_COMMUNITY)
Admission: EM | Admit: 2022-12-25 | Discharge: 2022-12-25 | Disposition: A | Discharge status: Home / Self Care | Payer: Medicare HMO | Attending: Emergency Medicine | Admitting: Emergency Medicine

## 2022-12-25 VITALS — BP 175/113 | HR 88 | Temp 98.6°F | Ht 62.0 in | Wt 143.5 lb

## 2022-12-25 DIAGNOSIS — E119 Type 2 diabetes mellitus without complications: Secondary | ICD-10-CM | POA: Diagnosis not present

## 2022-12-25 DIAGNOSIS — Z79899 Other long term (current) drug therapy: Secondary | ICD-10-CM | POA: Insufficient documentation

## 2022-12-25 DIAGNOSIS — C250 Malignant neoplasm of head of pancreas: Secondary | ICD-10-CM | POA: Insufficient documentation

## 2022-12-25 DIAGNOSIS — Z7901 Long term (current) use of anticoagulants: Secondary | ICD-10-CM | POA: Insufficient documentation

## 2022-12-25 DIAGNOSIS — Z5111 Encounter for antineoplastic chemotherapy: Secondary | ICD-10-CM | POA: Insufficient documentation

## 2022-12-25 DIAGNOSIS — I2699 Other pulmonary embolism without acute cor pulmonale: Secondary | ICD-10-CM | POA: Insufficient documentation

## 2022-12-25 DIAGNOSIS — I2693 Single subsegmental pulmonary embolism without acute cor pulmonale: Secondary | ICD-10-CM | POA: Insufficient documentation

## 2022-12-25 DIAGNOSIS — Z794 Long term (current) use of insulin: Secondary | ICD-10-CM | POA: Insufficient documentation

## 2022-12-25 DIAGNOSIS — Z8507 Personal history of malignant neoplasm of pancreas: Secondary | ICD-10-CM | POA: Insufficient documentation

## 2022-12-25 DIAGNOSIS — R160 Hepatomegaly, not elsewhere classified: Secondary | ICD-10-CM | POA: Insufficient documentation

## 2022-12-25 DIAGNOSIS — I10 Essential (primary) hypertension: Secondary | ICD-10-CM | POA: Diagnosis not present

## 2022-12-25 DIAGNOSIS — R9389 Abnormal findings on diagnostic imaging of other specified body structures: Secondary | ICD-10-CM | POA: Diagnosis present

## 2022-12-25 LAB — CBC WITH DIFFERENTIAL/PLATELET
Abs Immature Granulocytes: 0.01 10*3/uL (ref 0.00–0.07)
Basophils Absolute: 0 10*3/uL (ref 0.0–0.1)
Basophils Relative: 1 %
Eosinophils Absolute: 0.1 10*3/uL (ref 0.0–0.5)
Eosinophils Relative: 3 %
HCT: 32.1 % — ABNORMAL LOW (ref 36.0–46.0)
Hemoglobin: 10.1 g/dL — ABNORMAL LOW (ref 12.0–15.0)
Immature Granulocytes: 0 %
Lymphocytes Relative: 48 %
Lymphs Abs: 2.3 10*3/uL (ref 0.7–4.0)
MCH: 30.2 pg (ref 26.0–34.0)
MCHC: 31.5 g/dL (ref 30.0–36.0)
MCV: 96.1 fL (ref 80.0–100.0)
Monocytes Absolute: 0.4 10*3/uL (ref 0.1–1.0)
Monocytes Relative: 9 %
Neutro Abs: 1.9 10*3/uL (ref 1.7–7.7)
Neutrophils Relative %: 39 %
Platelets: 182 10*3/uL (ref 150–400)
RBC: 3.34 MIL/uL — ABNORMAL LOW (ref 3.87–5.11)
RDW: 19.5 % — ABNORMAL HIGH (ref 11.5–15.5)
WBC: 4.8 10*3/uL (ref 4.0–10.5)
nRBC: 0 % (ref 0.0–0.2)

## 2022-12-25 LAB — PROTIME-INR
INR: 1.1 (ref 0.8–1.2)
Prothrombin Time: 14.1 seconds (ref 11.4–15.2)

## 2022-12-25 LAB — BASIC METABOLIC PANEL
Anion gap: 7 (ref 5–15)
BUN: 12 mg/dL (ref 8–23)
CO2: 24 mmol/L (ref 22–32)
Calcium: 8.8 mg/dL — ABNORMAL LOW (ref 8.9–10.3)
Chloride: 109 mmol/L (ref 98–111)
Creatinine, Ser: 0.59 mg/dL (ref 0.44–1.00)
GFR, Estimated: >60 mL/min (ref >60)
Glucose, Bld: 71 mg/dL (ref 70–99)
Potassium: 3.5 mmol/L (ref 3.5–5.1)
Sodium: 140 mmol/L (ref 135–145)

## 2022-12-25 LAB — BRAIN NATRIURETIC PEPTIDE: B Natriuretic Peptide: 75.7 pg/mL (ref 0.0–100.0)

## 2022-12-25 LAB — TROPONIN I (HIGH SENSITIVITY): Troponin I (High Sensitivity): 4 ng/L (ref <18)

## 2022-12-25 MED ORDER — APIXABAN (ELIQUIS) VTE STARTER PACK (10MG AND 5MG)
ORAL_TABLET | ORAL | 0 refills | Status: DC
  Filled 2022-12-25: qty 74, 28d supply, fill #0

## 2022-12-25 MED ORDER — IOHEXOL 300 MG/ML  SOLN
100.0000 mL | Freq: Once | INTRAMUSCULAR | Status: AC | PRN
  Administered 2022-12-25: 100 mL via INTRAVENOUS

## 2022-12-25 NOTE — Telephone Encounter (Signed)
Late Entry:  Spoke with Mancel Bale ED Charge Nurse regarding pt's CT Scan results and Cira Rue, NP office assessment of pt.  Stated that pt's CT Scan taken today 12/25/2022 showed the pt has a lft PE; therefore, pt called into clinic for further assessment.  On assessment pt's SATS were WNL, BP elevated (see VS Flowsheet), and HR elevated (see VS Flowsheet).  A 6 mins walk test was done w/o desaturation of oxygen.  Pt's O2 Sats remained above 93% but pt's HR elevated.  EKG was done in clinic which showed abnormal (infarcts).  Pt denied hx of heart attack.  Information given to [facility] and based off this RN's and Natasha Bence, NP assessment pt needed further evaluation in the ED.  Lacie spoke with pt and spouse regarding CT Scan results and the need to go to ED for further evaluation.  Mancel Bale ED CN stated bring pt to Bed 4 and stop by registration before taking pt to room.  Verbalized understanding and brought pt to ED.  Report given to ED RN assigned to Rm#4.

## 2022-12-25 NOTE — ED Triage Notes (Signed)
Pt had CT scan this AM and was diagnosed with a PE. Pt denies SOB, pain, or other sx. Pt had abnormal EKG at cancer center PTA. Pt has pancreatic cancer

## 2022-12-25 NOTE — Discharge Instructions (Signed)
We evaluated you for your blood clot in your lungs (pulmonary embolism).  Your blood tests were reassuring and did not show any sign of danger or injury to your heart.  We discussed your results with Dr. Burr Medico.  We think it is safe to go home and start a blood thinner for your blood clot.  Dr. Burr Medico will see you in clinic on Thursday.  Blood thinners can increase your risk of bleeding.  If you develop any bleeding such as black stools, bloody stools, vomiting blood, coughing blood, please return to the emergency department.  If you hit your head, please return to the emergency department as a blood thinner can increase your risk of bleeding into your brain.  Be careful to avoid falls with taking a blood thinner.  Please also return to the emergency department if you develop any worsening symptoms such as chest pain, shortness of breath, difficulty breathing, lightheadedness or dizziness, fainting, leg swelling, or any other concerning symptoms.

## 2022-12-25 NOTE — Progress Notes (Addendum)
Patient Care Team: [name], MD as PCP - General (Internal Medicine) [name], MD as PCP - Cardiology (Cardiology) Truitt Merle, MD as Consulting Physician (Oncology)   CHIEF COMPLAINT: Symptom management for incidental PE found on restaging CT CAP this morning   Oncology History Overview Note   Cancer Staging  Pancreatic cancer [facility]) Staging form: Exocrine Pancreas, AJCC 8th Edition - Clinical stage from 01/18/2022: Stage IB (cT2, cN0, cM0) - Signed by Truitt Merle, MD on 01/28/2022    Pancreatic cancer ([place])  01/16/2022 Imaging   CLINICAL DATA:  Jaundice   EXAM: CT ABDOMEN AND PELVIS WITH CONTRAST  IMPRESSION: 1. Marked intra and extrahepatic biliary dilatation with marked dilatation of pancreatic duct and distended gallbladder. There is significant atrophy of the body and tail of pancreas. 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 2. Diverticular disease of the colon without acute wall thickening 3. Mild endometrial thickening up to 9 mm, recommend nonemergent pelvic ultrasound for further evaluation   01/17/2022 Imaging   EXAM: MRI ABDOMEN WITHOUT AND WITH CONTRAST (INCLUDING MRCP)  IMPRESSION: 1. Obstructing mass within the pancreatic head with an intraluminal component demonstrating low level enhancement. 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 2. This mass causes significant extrinsic narrowing at the portal/superior mesenteric vein confluence as well as marked intra and extrahepatic biliary dilatation. 3. No distant metastases identified. 4. Endoscopy for endoscopic ultrasound, tissue sampling and biliary decompression recommended.   01/18/2022 Cancer  Staging   Staging form: Exocrine Pancreas, AJCC 8th Edition - Clinical stage from 01/18/2022: Stage IB (cT2, cN0, cM0) - Signed by Truitt Merle, MD on 01/28/2022 Stage prefix: Initial diagnosis Total positive nodes: 0   01/18/2022 Initial Biopsy   FINAL MICROSCOPIC DIAGNOSIS:  A. PANCREAS, HEAD, FINE NEEDLE ASPIRATION:  - Malignant cells present consistent with adenocarcinoma   01/24/2022 Imaging   EXAM: CT CHEST WITHOUT CONTRAST  IMPRESSION: 1. No evidence of metastatic disease in the chest. 2. Atelectasis or consolidation in the right lower lung. 3. Healing right rib fractures. 4. Mass in the head of the pancreas. See previous report of CT abdomen and pelvis 01/16/2022. Interval placement of the biliary stent.   01/28/2022 Initial Diagnosis   Pancreatic cancer ([place])   02/16/2022 - 06/28/2022 Chemotherapy   Patient is on Treatment Plan : PANCREATIC Abraxane / Gemcitabine D1,8 q21d     02/16/2022 Genetic Testing   Negative genetic testing on the CancerNext-Expanded+RNAinsight panel.  The report date is February 16, 2022.  The CancerNext-Expanded gene panel offered by [facility] and includes sequencing and rearrangement analysis for the following 77 genes: AIP, ALK, APC*, ATM*, AXIN2, BAP1, BARD1, BLM, BMPR1A, BRCA1*, BRCA2*, BRIP1*, CDC73, CDH1*, CDK4, CDKN1B, CDKN2A, CHEK2*, CTNNA1, DICER1, FANCC, FH, FLCN, GALNT12, KIF1B, LZTR1, MAX, MEN1, MET, MLH1*, MSH2*, MSH3, MSH6*, MUTYH*, NBN, NF1*, NF2, NTHL1, PALB2*, PHOX2B, PMS2*, POT1, PRKAR1A, PTCH1, PTEN*, RAD51C*, RAD51D*, RB1, RECQL, RET, SDHA, SDHAF2, SDHB, SDHC, SDHD, SMAD4, SMARCA4, SMARCB1, SMARCE1, STK11, SUFU, TMEM127, TP53*, TSC1, TSC2, VHL and XRCC2 (sequencing and deletion/duplication); EGFR, EGLN1, HOXB13, KIT, MITF, PDGFRA, POLD1, and POLE (sequencing only); EPCAM and GREM1 (deletion/duplication only). DNA and RNA analyses performed for * genes.    06/14/2022 - 09/19/2022 Chemotherapy   Patient is on Treatment Plan : PANCREATIC Abraxane  D1,8,15 + Gemcitabine D1,8,15 q28d     10/11/2022 -  Chemotherapy   Patient is on Treatment Plan : PANCREAS Liposomal Irinotecan + Leucovorin + 5-FU IVCI q14d        Miranda Francis returns with her husband for symptom management visit due to incidental PE found on restaging CT earlier this morning.  Last chemo liposomal irinotecan/5-FU on 12/06/2022, felt "pretty bad" with low energy and appetite.  For about 2 weeks to recover.  She had chills but no fever.  Denies nausea/vomiting, diarrhea, cough, chest pain, dyspnea or other respiratory symptoms.  ROS   Past Medical History:  Diagnosis Date   Blood transfusion    Diabetes mellitus    Family history of breast cancer    Family history of colon cancer    Family history of prostate cancer    Heart murmur    Hypertension    pancreatic ca 12/2021     Past Surgical History:  Procedure Laterality Date   BILIARY STENT PLACEMENT  01/18/2022   Procedure: BILIARY STENT PLACEMENT;  Surgeon: Carol Ada, MD;  Location: [place];  Service: Gastroenterology;;   DILATION AND CURETTAGE OF UTERUS     ERCP N/A 01/18/2022   Procedure: ENDOSCOPIC RETROGRADE CHOLANGIOPANCREATOGRAPHY (ERCP);  Surgeon: Carol Ada, MD;  Location: [place];  Service: Gastroenterology;  Laterality: N/A;   ESOPHAGOGASTRODUODENOSCOPY N/A 01/18/2022   Procedure: ESOPHAGOGASTRODUODENOSCOPY (EGD);  Surgeon: Carol Ada, MD;  Location: [place];  Service: Gastroenterology;  Laterality: N/A;   EUS  01/18/2022   Procedure: UPPER ENDOSCOPIC ULTRASOUND (EUS) LINEAR;  Surgeon: Carol Ada, MD;  Location: [facility] ENDOSCOPY;  Service: Gastroenterology;;   FINE NEEDLE ASPIRATION  01/18/2022   Procedure: FINE NEEDLE ASPIRATION (FNA) LINEAR;  Surgeon: Carol Ada, MD;  Location: [facility] ENDOSCOPY;  Service: Gastroenterology;;   IR IMAGING GUIDED PORT INSERTION  02/15/2022   NO PAST SURGERIES     SPHINCTEROTOMY  01/18/2022   Procedure: Joan Mayans;  Surgeon: Carol Ada,  MD;  Location: [place];  Service: Gastroenterology;;     Outpatient Encounter Medications as of 12/25/2022  Medication Sig   acetaminophen (TYLENOL) 500 MG tablet Take 1,000 mg by mouth every 6 (six) hours as needed for moderate pain or headache.   amLODipine (NORVASC) 5 MG tablet Take 5 mg by mouth daily.   Cholecalciferol (QC VITAMIN D3) 50 MCG (2000 UT) TABS Take 2,000 Units by mouth daily.   Continuous Blood Gluc Sensor (FREESTYLE LIBRE 2 SENSOR) MISC USE 1 SENSOR AND CHANGE EVERY 14 DAYS AS DIRECTED.   dexamethasone (DECADRON) 4 MG tablet Take 2 tablets (8 mg total) by mouth daily. Start the day after irinotecan chemotherapy for 2 days. Take with food.   diphenoxylate-atropine (LOMOTIL) 2.5-0.025 MG tablet Take 1 tablet by mouth 4 (four) times daily as needed for diarrhea or loose stools.   gabapentin (NEURONTIN) 100 MG capsule Take 2-3 capsules (200-300 mg total) by mouth at bedtime.   KLOR-CON M20 20 MEQ tablet Take 20 mEq by mouth 2 (two) times daily.   lidocaine-prilocaine (EMLA) cream Apply 1 application topically as needed.   losartan (COZAAR) 100 MG tablet Take 100 mg by mouth daily.   mirtazapine (REMERON) 7.5 MG tablet Take 1 tablet (7.5 mg total) by mouth at bedtime.   NOVOLOG FLEXPEN 100 UNIT/ML FlexPen Inject 15 Units into the skin 2 (two) times daily.   ondansetron (ZOFRAN) 8 MG tablet Take 1 tablet (8 mg total) by mouth every 8 (eight) hours as needed for nausea or vomiting. Start  on the third day after irinotecan   oxyCODONE (OXY IR/ROXICODONE) 5 MG immediate release tablet Take 1 tablet (5 mg total) by mouth every 3 (three) hours as needed for severe pain.   polyethylene glycol (MIRALAX / GLYCOLAX) 17 g packet Take 17 g by mouth daily.   prochlorperazine (COMPAZINE) 10 MG tablet Take 1 tablet (10 mg total) by mouth every 6 (six) hours as needed for nausea or vomiting.   zolpidem (AMBIEN) 10 MG tablet Take 10 mg by mouth at bedtime as needed.   No facility-administered  encounter medications on file as of 12/25/2022.     Today's Vitals   12/25/22 1100  BP: (!) 175/113  Pulse: 88  Temp: 98.6 F (37 C)  TempSrc: Oral  SpO2: 99%  Weight: 143 lb 8 oz (65.1 kg)  Height: 5' 2"$  (1.575 m)   Body mass index is 26.25 kg/m.   PHYSICAL EXAM GENERAL:alert, no distress and comfortable SKIN: no rash  EYES: sclera clear NECK: without mass LUNGS: Decreased on the right, normal breathing effort HEART: regular rate & rhythm, no lower extremity edema NEURO: alert & oriented x 3 with fluent speech, no focal motor/sensory deficits   CBC    Component Value Date/Time   WBC 6.5 12/06/2022 0833   WBC 5.2 11/26/2022 1215   RBC 3.18 (L) 12/06/2022 0833   HGB 9.4 (L) 12/06/2022 0833   HCT 29.0 (L) 12/06/2022 0833   PLT 184 12/06/2022 0833   MCV 91.2 12/06/2022 0833   MCH 29.6 12/06/2022 0833   MCHC 32.4 12/06/2022 0833   RDW 20.7 (H) 12/06/2022 0833   LYMPHSABS 2.5 12/06/2022 0833   MONOABS 0.5 12/06/2022 0833   EOSABS 0.1 12/06/2022 0833   BASOSABS 0.1 12/06/2022 0833     CMP     Component Value Date/Time   NA 141 12/06/2022 0833   K 3.8 12/06/2022 0833   CL 110 12/06/2022 0833   CO2 25 12/06/2022 0833   GLUCOSE 131 (H) 12/06/2022 0833   BUN 12 12/06/2022 0833   CREATININE 0.60 12/06/2022 0833   CALCIUM 8.8 (L) 12/06/2022 0833   PROT 6.1 (L) 12/06/2022 0833   ALBUMIN 3.1 (L) 12/06/2022 0833   AST 21 12/06/2022 0833   ALT 15 12/06/2022 0833   ALKPHOS 282 (H) 12/06/2022 0833   BILITOT 0.4 12/06/2022 0833   GFRNONAA >60 12/06/2022 0833   GFRAA >60 10/31/2018 Butler is a 77 y.o. female with       Acute PE -Incidental finding on restaging scan for pancreatic cancer this morning, with filling defect in the left main pulmonary artery extending into the left upper and lower lobe branches.  No evidence of right heart strain -Likely secondary to underlying pancreatic cancer and chemotherapy  -Asymptomatic -BP  elevated, she is anxious having to come here from CT scan;  -HR 88-102 with O2 97-99% on 6-minute walk test -abnormal EKG in clinic  -Given the above and per Dr. Earlie Server, the recommendation is to proceed to ED further evaluation and subsequent anticoagulation -She agrees, transported to ED in stable condition  2. Pancreatic Cancer, stage IB c(T2, N0, M0), indeterminate liver lesions; MMR normal   -presented to PCP with jaundice for one month. Also complained of epigastric pain, poor appetite, weight loss, and light-colored stool.  -work up showed obstructing mass within pancreatic head with low-level enhancement, causing significant extrinsic narrowing at SMV and dilatation. -ERCP on 01/18/22 by Dr. Benson Norway, FNA of pancreas head  mass showed malignant cells consistent with adenocarcinoma. MMR normal. -baseline CA 19-9 on 01/16/22 was elevated at 9,561. -She began neoadjuvant chemo with gemcitabine and abraxane in 02/2022 -MRI 06/19/22 showed subtle liver lesions (stable on MRI 01/2022 but not reported then), no change in pancreatic mass -Surgeon Dr. Zenia Resides feels she is not a good surgical candidate due to indeterminate liver lesions and CA 19-9 >1000 -Restaging CT CAP 09/24/22 showed enlarging liver lesions, stable pancreatic head mass, no other new disease -Recent liver biopsy confirmed metastatic pancreatic cancer  -Currently on second line liposomal irinotecan/5-FU on 10/09/2022, S/p 4 cycles.  She tolerates moderately well with generally feeling unwell, low energy and appetite, no significant n/v/d. She recovers well.  -restaging CT CAP 12/25/22 showed stable pancreatic lesion, but unfortunately new and enlarging liver lesions. I briefly discussed this with her today while here to address acute PE. CA 19-9 has increased significantly. Will change treatment after she recovers from PE.   3. Genetic Testing -she reports colon cancer in a brother (age 10) and a sister (age 10). -she proceeded with testing on  02/07/22. Results were negative.  PLAN: -Reviewed CT CAP -ED eval for acute PE -Dr. Burr Medico will see her inpatient if she gets admitted  -Will adjust future appointments if needed    All questions were answered. The patient knows to call the clinic with any problems, questions or concerns. No barriers to learning were detected. I spent 20 minutes counseling the patient face to face. The total time spent in the appointment was 30 minutes and more than 50% was on counseling, review of test results, and coordination of care.   Cira Rue, NP-C 12/25/2022

## 2022-12-25 NOTE — ED Provider Notes (Signed)
[facility] Provider Note  CSN: [id number] Arrival date & time: 12/25/22 1135  Chief Complaint(s) Abnormal ECG (Abnormal CT scan (showed PE))  HPI Miranda Francis is a 77 y.o. female with history of metastatic pancreatic cancer presenting with abnormal imaging.  Patient reports that she had a cancer staging scan today, was called to come to the emergency department as she has a pulmonary embolism.  Patient denies any shortness of breath, chest pain, fevers or chills, cough, hemoptysis, lightheadedness or dizziness, syncope, nausea, vomiting, reports chronic abdominal pain which is unchanged.  No leg pain or swelling.  No history of VTE.   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   Left shoulder pain 09/02/2022   Jaw dyskinesia 05/24/2022   Port-A-Cath in place 03/14/2022   Genetic testing 02/16/2022   Family history of colon cancer 02/07/2022   Family history of breast cancer 02/07/2022   Family history of prostate cancer 02/07/2022   Pancreatic cancer ([place]) 01/28/2022   Normocytic anemia 01/22/2022   Post-ERCP acute pancreatitis 01/19/2022   Malnutrition of moderate degree 01/18/2022   Pancreatic mass 01/17/2022   Elevated liver enzymes 01/17/2022   Hypertension, benign essential, goal below 140/90 09/28/2015   Hypokalemia 11/01/2011   Weakness generalized 11/01/2011   Serum lipase elevation 11/01/2011   Nausea & vomiting 11/01/2011   Diarrhea 11/01/2011   Endometritis 11/01/2011   DM (diabetes mellitus) ([place]) 11/01/2011   HTN (hypertension) 11/01/2011   Hyperlipidemia 11/01/2011   Obesity  11/01/2011   Home Medication(s) Prior to Admission medications   Medication Sig Start Date End Date Taking? Authorizing Provider  APIXABAN (ELIQUIS) VTE STARTER PACK (10MG AND 5MG) Take as directed on package: start with two-64m tablets twice daily for 7 days. On day 8, switch to one-528mtablet twice daily. 12/25/22  Yes ScCristie HemMD  acetaminophen (TYLENOL) 500 MG tablet Take 1,000 mg by mouth every 6 (six) hours as needed for moderate pain or headache.    [provider]  amLODipine (NORVASC) 5 MG tablet Take 5 mg by mouth daily. 10/17/21   [provider]  Cholecalciferol (QC VITAMIN D3) 50 MCG (2000 UT) TABS Take 2,000 Units by mouth daily.    [provider]  Continuous Blood Gluc Sensor (FREESTYLE LIBRE 2 SENSOR) MISC USE 1 SENSOR AND CHANGE EVERY 14 DAYS AS DIRECTED. 09/07/22   [provider]  dexamethasone (DECADRON) 4 MG tablet Take 2 tablets (8 mg total) by mouth daily. Start the day after irinotecan chemotherapy for 2 days. Take with food. 10/08/22   FeTruitt MerleMD  diphenoxylate-atropine (LOMOTIL) 2.5-0.025 MG tablet Take 1 tablet by mouth 4 (four) times daily as needed for diarrhea or loose stools. 11/16/22   Walisiewicz, KaVerline Lema, PA-C  gabapentin (NEURONTIN) 100 MG capsule Take 2-3 capsules (200-300 mg total) by mouth at bedtime. 11/06/22   BuAlla FeelingNP  KLOR-CON M20 20 MEQ tablet Take 20 mEq by mouth 2 (two) times daily. 01/14/22   [provider]  lidocaine-prilocaine (EMLA) cream Apply 1 application topically as needed. 07/12/22  Alla Feeling, NP  losartan (COZAAR) 100 MG tablet Take 100 mg by mouth daily. 07/12/22   [provider]  mirtazapine (REMERON) 7.5 MG tablet Take 1 tablet (7.5 mg total) by mouth at bedtime. 11/06/22   Alla Feeling, NP  NOVOLOG FLEXPEN 100 UNIT/ML FlexPen Inject 15 Units into the skin 2 (two) times daily. 05/03/22   [provider]  ondansetron (ZOFRAN) 8 MG tablet Take 1  tablet (8 mg total) by mouth every 8 (eight) hours as needed for nausea or vomiting. Start on the third day after irinotecan 10/08/22   Truitt Merle, MD  oxyCODONE (OXY IR/ROXICODONE) 5 MG immediate release tablet Take 1 tablet (5 mg total) by mouth every 3 (three) hours as needed for severe pain. 01/25/22   Shelly Coss, MD  polyethylene glycol (MIRALAX / GLYCOLAX) 17 g packet Take 17 g by mouth daily. 01/26/22   Shelly Coss, MD  prochlorperazine (COMPAZINE) 10 MG tablet Take 1 tablet (10 mg total) by mouth every 6 (six) hours as needed for nausea or vomiting. 10/08/22   Truitt Merle, MD  zolpidem (AMBIEN) 10 MG tablet Take 10 mg by mouth at bedtime as needed. 07/09/22   [provider]                                                                                                                                    Past Surgical History Past Surgical History:  Procedure Laterality Date   BILIARY STENT PLACEMENT  01/18/2022   Procedure: BILIARY STENT PLACEMENT;  Surgeon: Carol Ada, MD;  Location: [facility];  Service: Gastroenterology;;   DILATION AND CURETTAGE OF UTERUS     ERCP N/A 01/18/2022   Procedure: ENDOSCOPIC RETROGRADE CHOLANGIOPANCREATOGRAPHY (ERCP);  Surgeon: Carol Ada, MD;  Location: [facility];  Service: Gastroenterology;  Laterality: N/A;   ESOPHAGOGASTRODUODENOSCOPY N/A 01/18/2022   Procedure: ESOPHAGOGASTRODUODENOSCOPY (EGD);  Surgeon: Carol Ada, MD;  Location: [facility];  Service: Gastroenterology;  Laterality: N/A;   EUS  01/18/2022   Procedure: UPPER ENDOSCOPIC ULTRASOUND (EUS) LINEAR;  Surgeon: Carol Ada, MD;  Location: [facility];  Service: Gastroenterology;;   FINE NEEDLE ASPIRATION  01/18/2022   Procedure: FINE NEEDLE ASPIRATION (FNA) LINEAR;  Surgeon: Carol Ada, MD;  Location: [facility];  Service: Gastroenterology;;   IR IMAGING GUIDED PORT INSERTION  02/15/2022   NO PAST SURGERIES     SPHINCTEROTOMY  01/18/2022   Procedure: Joan Mayans;   Surgeon: Carol Ada, MD;  Location: [facility] ENDOSCOPY;  Service: Gastroenterology;;   Family History Family History  Problem Relation Age of Onset   Diabetes type II Mother    Stroke Father    Diabetes type II Sister    Colon cancer Sister 66   Colon cancer Brother 29   Breast cancer Maternal Aunt 90   Breast cancer Maternal Aunt 94   Prostate cancer Maternal Uncle    Heart attack Maternal Grandmother  Social History Social History   Tobacco Use   Smoking status: Never   Smokeless tobacco: Never  Substance Use Topics   Alcohol use: Never   Drug use: Never   Allergies Irinotecan liposome  Review of Systems Review of Systems  All other systems reviewed and are negative.   Physical Exam Vital Signs  I have reviewed the triage vital signs BP (!) 144/79   Pulse (!) 55   Temp 98.6 F (37 C) (Oral)   Resp 14   Ht 5' 2"$  (1.575 m)   Wt 65 kg   SpO2 99%   BMI 26.21 kg/m  Physical Exam Vitals and nursing note reviewed.  Constitutional:      General: She is not in acute distress.    Appearance: She is well-developed.  HENT:     Head: Normocephalic and atraumatic.     Mouth/Throat:     Mouth: Mucous membranes are moist.  Eyes:     Pupils: Pupils are equal, round, and reactive to light.  Cardiovascular:     Rate and Rhythm: Normal rate and regular rhythm.     Heart sounds: No murmur heard. Pulmonary:     Effort: Pulmonary effort is normal. No respiratory distress.     Breath sounds: Normal breath sounds.  Abdominal:     General: Abdomen is flat.     Palpations: Abdomen is soft.     Tenderness: There is no abdominal tenderness.  Musculoskeletal:        General: No tenderness.     Right lower leg: No edema.     Left lower leg: No edema.  Skin:    General: Skin is warm and dry.  Neurological:     General: No focal deficit present.     Mental Status: She is alert. Mental status is at baseline.  Psychiatric:        Mood and Affect: Mood normal.         Behavior: Behavior normal.     ED Results and Treatments Labs (all labs ordered are listed, but only abnormal results are displayed) Labs Reviewed  CBC WITH DIFFERENTIAL/PLATELET - Abnormal; Notable for the following components:      Result Value   RBC 3.34 (*)    Hemoglobin 10.1 (*)    HCT 32.1 (*)    RDW 19.5 (*)    All other components within normal limits  BASIC METABOLIC PANEL - Abnormal; Notable for the following components:   Calcium 8.8 (*)    All other components within normal limits  BRAIN NATRIURETIC PEPTIDE  PROTIME-INR  TROPONIN I (HIGH SENSITIVITY)                                                                                                                          Radiology CT ABD PELVIS W/WO CM ONCOLOGY PANCREATIC PROTOCOL  Result Date: 12/25/2022 CLINICAL DATA:  Pancreatic cancer.  Assess treatment response. EXAM: CT CHEST WITH CONTRAST CT ABDOMEN AND PELVIS WITH  AND WITHOUT CONTRAST TECHNIQUE: Multidetector CT imaging of the chest was performed during intravenous contrast administration. Multidetector CT imaging of the abdomen and pelvis was performed following the standard protocol before and during bolus administration of intravenous contrast. RADIATION DOSE REDUCTION: This exam was performed according to the departmental dose-optimization program which includes automated exposure control, adjustment of the mA and/or kV according to patient size and/or use of iterative reconstruction technique. CONTRAST:  136m OMNIPAQUE IOHEXOL 300 MG/ML  SOLN COMPARISON:  09/24/2022 FINDINGS: CT CHEST FINDINGS Cardiovascular: The heart is normal in size. No pericardial effusion. Mild tortuosity of the thoracic aorta but no focal aneurysm or dissection. Scattered atherosclerotic calcifications. Three-vessel coronary artery calcifications. Filling defect is noted in the left main pulmonary artery extending into the left upper and lower lobe branches. No right-sided pulmonary emboli. No  findings for right heart strain. Mediastinum/Nodes: No mediastinal or hilar mass or lymphadenopathy. Small scattered lymph nodes are noted. Small subcentimeter thyroid nodules. Not clinically significant; no follow-up imaging recommended (ref: J Am Coll Radiol. 2015 Feb;12(2): 143-50). Small bilateral axillary lymph nodes. No obvious breast masses. The esophagus is unremarkable. Lungs/Pleura: 2 small adjacent left upper lobe pulmonary nodules are unchanged since 09/24/2022. Stable right lower lobe calcified granulomas. No new pulmonary lesions or nodules. No acute pulmonary process. No pleural effusions or pleural nodules. Musculoskeletal: No significant bony findings. CT ABDOMEN AND PELVIS FINDINGS Hepatobiliary: Interval enlargement hepatic metastatic lesions. Segment 2 lesion on image 84/8 measures a maximum 22 mm and previously measured 14 mm. Segment 5 lesion on image 88/8 measures 22 mm and previously measured 16 mm. Segment 3 lesion on image 89/8 measures 22 mm and previously measured 12 mm. New segment 8 lesion on image 77/8 measures 11 mm. Biliary stent in place with associated pneumobilia and gas in the gallbladder. Mild to moderate biliary dilatation. Pancreas: Difficult to measure infiltrating pancreatic neoplasm appears grossly stable in size when compared to the prior study. Estimated size is 5 x 2.5 cm. Spleen: Mild progressive splenomegaly. 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 Adrenals/Urinary Tract: The adrenal glands and kidneys are unremarkable stable. The bladder is unremarkable. Stomach/Bowel: The stomach, duodenum, small bowel and colon are grossly normal. Vascular/Lymphatic: Stable aortic calcifications. No aneurysm or dissection. 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 Extensive portal venous collateral vessels. The main portal vein is patent. Stable small peripancreatic and mesenteric lymph nodes. No new or progressive findings. Reproductive: Stable  fibroid uterus. Other: No pelvic mass or adenopathy. No free pelvic fluid collections. No inguinal mass or adenopathy. Stable small bilateral inguinal hernias containing fat. Musculoskeletal: No significant bony findings. Remote lumbar spine surgery. IMPRESSION: 1. Left-sided pulmonary emboli. No findings for right heart strain. 2. Stable small left upper lobe pulmonary nodules. No new or progressive findings in the chest. 3. Overall stable appearing infiltrating pancreatic neoplasm. 4. New and interval enlargement of hepatic metastatic disease. 5. Biliary stent in place with associated pneumobilia and gas in the gallbladder. Mild to moderate biliary dilatation. 6. 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 Extensive portal venous collateral vessels. Aortic Atherosclerosis (ICD10-I70.0). These results will be called to the ordering clinician or representative by the Radiologist Assistant, and communication documented in the PACS or [employer] Electronically Signed   By: PMarijo SanesM.D.   On: 12/25/2022 09:25   CT CHEST W CONTRAST  Result Date: 12/25/2022 CLINICAL DATA:  Pancreatic cancer.  Assess treatment response. EXAM: CT CHEST WITH CONTRAST CT ABDOMEN AND PELVIS WITH  AND WITHOUT CONTRAST TECHNIQUE: Multidetector CT imaging of the chest was performed during intravenous contrast administration. Multidetector CT imaging of the abdomen and pelvis was performed following the standard protocol before and during bolus administration of intravenous contrast. RADIATION DOSE REDUCTION: This exam was performed according to the departmental dose-optimization program which includes automated exposure control, adjustment of the mA and/or kV according to patient size and/or use of iterative reconstruction technique. CONTRAST:  158m OMNIPAQUE IOHEXOL 300 MG/ML  SOLN COMPARISON:  09/24/2022 FINDINGS: CT CHEST FINDINGS Cardiovascular: The heart is normal in size. No pericardial effusion.  Mild tortuosity of the thoracic aorta but no focal aneurysm or dissection. Scattered atherosclerotic calcifications. Three-vessel coronary artery calcifications. Filling defect is noted in the left main pulmonary artery extending into the left upper and lower lobe branches. No right-sided pulmonary emboli. No findings for right heart strain. Mediastinum/Nodes: No mediastinal or hilar mass or lymphadenopathy. Small scattered lymph nodes are noted. Small subcentimeter thyroid nodules. Not clinically significant; no follow-up imaging recommended (ref: J Am Coll Radiol. 2015 Feb;12(2): 143-50). Small bilateral axillary lymph nodes. No obvious breast masses. The esophagus is unremarkable. Lungs/Pleura: 2 small adjacent left upper lobe pulmonary nodules are unchanged since 09/24/2022. Stable right lower lobe calcified granulomas. No new pulmonary lesions or nodules. No acute pulmonary process. No pleural effusions or pleural nodules. Musculoskeletal: No significant bony findings. CT ABDOMEN AND PELVIS FINDINGS Hepatobiliary: Interval enlargement hepatic metastatic lesions. Segment 2 lesion on image 84/8 measures a maximum 22 mm and previously measured 14 mm. Segment 5 lesion on image 88/8 measures 22 mm and previously measured 16 mm. Segment 3 lesion on image 89/8 measures 22 mm and previously measured 12 mm. New segment 8 lesion on image 77/8 measures 11 mm. Biliary stent in place with associated pneumobilia and gas in the gallbladder. Mild to moderate biliary dilatation. Pancreas: Difficult to measure infiltrating pancreatic neoplasm appears grossly stable in size when compared to the prior study. Estimated size is 5 x 2.5 cm. Spleen: Mild progressive splenomegaly. 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 Adrenals/Urinary Tract: The adrenal glands and kidneys are unremarkable stable. The bladder is unremarkable. Stomach/Bowel: The stomach, duodenum, small bowel and colon are grossly normal. Vascular/Lymphatic: Stable  aortic calcifications. No aneurysm or dissection. 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 Extensive portal venous collateral vessels. The main portal vein is patent. Stable small peripancreatic and mesenteric lymph nodes. No new or progressive findings. Reproductive: Stable fibroid uterus. Other: No pelvic mass or adenopathy. No free pelvic fluid collections. No inguinal mass or adenopathy. Stable small bilateral inguinal hernias containing fat. Musculoskeletal: No significant bony findings. Remote lumbar spine surgery. IMPRESSION: 1. Left-sided pulmonary emboli. No findings for right heart strain. 2. Stable small left upper lobe pulmonary nodules. No new or progressive findings in the chest. 3. Overall stable appearing infiltrating pancreatic neoplasm. 4. New and interval enlargement of hepatic metastatic disease. 5. Biliary stent in place with associated pneumobilia and gas in the gallbladder. Mild to moderate biliary dilatation. 6. 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 Extensive portal venous collateral vessels. Aortic Atherosclerosis (ICD10-I70.0). These results will be called to the ordering clinician or representative by the Radiologist Assistant, and communication documented in the PACS or [employer] Electronically Signed   By: PMarijo SanesM.D.   On: 12/25/2022 09:25    Pertinent labs & imaging results that were available during my care of the patient were reviewed by me and considered in my medical decision making (see  MDM for details).  Medications Ordered in ED Medications - No data to display                                                                                                                                   Procedures Procedures  (including critical care time)  Medical Decision Making / ED Course   MDM:  77 year old female presenting to the emergency department with abnormal imaging.  Patient  well-appearing, physical exam with reassuring vital signs, lungs clear.  Patient denies any symptoms from recent PE diagnosis such as chest pain, shortness of breath or lightheadedness.  No leg swelling or tenderness to suggest DVT.  Patient without signs of hemodynamic instability or other findings such as hypoxia, tachycardia.  Will check troponin, BNP to evaluate for heart strain.  No evidence of heart strain on CT scan.  Likely related to cancer.  Patient will need anticoagulation.  Patient denies any history of GI bleeding or other complications related to bleeding which could impact initiation of anticoagulation.  Will also discussed with the patient's oncologist.  If workup overall reassuring, will discuss with patient.  PESI score is elevated due to age and history of malignancy. Clinical Course as of 12/25/22 1417  Tue Dec 25, 2022  1414 Discussed with Dr. Burr Medico.  Given the patient has no symptoms, is not hypoxic or tachycardic, EKG appears similar to previous, troponin is negative, and BNP is normal, I think it is reasonable for the patient to be discharged and started on Eliquis with strict return precautions.  Dr. Burr Medico agrees with this and patient has close follow-up with her on Thursday.  Discussed bleeding complications of Eliquis and avoiding falls. Will discharge patient to home. All questions answered. Patient comfortable with plan of discharge. Return precautions discussed with patient and specified on the after visit summary. [WS]    Clinical Course User Index [WS] Cristie Hem, MD     Additional history obtained: -Additional history obtained from spouse -External records from outside source obtained and reviewed including: Chart review including previous notes, labs, imaging, consultation notes including cancer center note today   Lab Tests: -I ordered, reviewed, and interpreted labs.   The pertinent results include:   Labs Reviewed  CBC WITH DIFFERENTIAL/PLATELET -  Abnormal; Notable for the following components:      Result Value   RBC 3.34 (*)    Hemoglobin 10.1 (*)    HCT 32.1 (*)    RDW 19.5 (*)    All other components within normal limits  BASIC METABOLIC PANEL - Abnormal; Notable for the following components:   Calcium 8.8 (*)    All other components within normal limits  BRAIN NATRIURETIC PEPTIDE  PROTIME-INR  TROPONIN I (HIGH SENSITIVITY)    Notable for chronic stable anemia, normal troponin, normal BNP  EKG   EKG Interpretation  Date/Time:  Tuesday December 25 2022 12:10:51 EST Ventricular Rate:  61 PR  Interval:  181 QRS Duration: 99 QT Interval:  403 QTC Calculation: 406 R Axis:   -24 Text Interpretation: Sinus rhythm Inferior infarct, old Confirmed by Garnette Gunner [phone]) on 12/25/2022 12:28:13 PM          Medicines ordered and prescription drug management: Meds ordered this encounter  Medications   APIXABAN (ELIQUIS) VTE STARTER PACK (10MG AND 5MG)    Sig: Take as directed on package: start with two-34m tablets twice daily for 7 days. On day 8, switch to one-58mtablet twice daily.    Dispense:  74 each    Refill:  0    -I have reviewed the patients home medicines and have made adjustments as needed   Consultations Obtained: I requested consultation with the oncologist Dr. FeBurr Medico and discussed lab and imaging findings as well as pertinent plan - they recommend: discharge with close outpatient follow up   Cardiac Monitoring: The patient was maintained on a cardiac monitor.  I personally viewed and interpreted the cardiac monitored which showed an underlying rhythm of: NSR  Co morbidities that complicate the patient evaluation  Past Medical History:  Diagnosis Date   Blood transfusion    Diabetes mellitus    Family history of breast cancer    Family history of colon cancer    Family history of prostate cancer    Heart murmur    Hypertension    pancreatic ca 12/2021      Dispostion: Disposition  decision including need for hospitalization was considered, and patient discharged from emergency department.    Final Clinical Impression(s) / ED Diagnoses Final diagnoses:  Single subsegmental pulmonary embolism without acute cor pulmonale ([place]    This chart was dictated using voice recognition software.  Despite best efforts to proofread,  errors can occur which can change the documentation meaning.    ScCristie HemMD 12/25/22 [phone]

## 2022-12-25 NOTE — Telephone Encounter (Signed)
Report called from [facility] regarding pt's CT Scan Results for today (12/25/2022).  CT Scan showed Lft PE which this RN verbally notified Cira Rue, NP of CT Scan Results.  Pt called to come into see [facility] today for 6 min walk and EKG.  Pt stated she is on her way back to the [facility].

## 2022-12-26 ENCOUNTER — Telehealth: Payer: Self-pay | Admitting: Hematology

## 2022-12-26 MED FILL — Dexamethasone Sodium Phosphate Inj 100 MG/10ML: INTRAMUSCULAR | Qty: 1 | Status: AC

## 2022-12-26 MED FILL — Fosaprepitant Dimeglumine For IV Infusion 150 MG (Base Eq): INTRAVENOUS | Qty: 5 | Status: AC

## 2022-12-26 NOTE — Progress Notes (Unsigned)
[place]   [phone] [phone]   Clinic Follow up Note   Patient Care Team: [name], MD as PCP - General (Internal Medicine) [name], MD as PCP - Cardiology (Cardiology) Truitt Merle, MD as Consulting Physician (Oncology)  Date of Service:  12/27/2022  CHIEF COMPLAINT: f/u of pancreatic cancer     CURRENT THERAPY:  Second line FOLFIRI (liposomal irinotecan/5FU)    ASSESSMENT:  Miranda Francis is a 77 y.o. 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 presented to PCP with jaundice for one month. Also complained of epigastric pain, poor appetite, weight loss, and light-colored stool.  -she began neoadjuvant gemcitabine alone on 02/16/22, abraxane was added with C2 (03/08/22).   -abdomen MRI on 06/19/22 showed: subtle liver lesions are stable from MRI in 01/2022 (not reported then); no change in pancreatic mass.  -Dr. Zenia Resides feels she is not a good surgical candidate due to he indeterminate liver lesions and high CA19.9 over one thousand. -restaging CT CAP on 09/24/22 showed: enlargement of two liver lesions; stable pancreatic head mass; no adenopathy.  -I changed her treatment to second line FOLFIRI (5FU pump / liposomal irinotecan) on 10/09/22  -Her recent liver biopsy confirmed metastatic pancreatic cancer, I reviewed with patient. -Due to severe diarrhea and poor tolerance to chemo, I reduced her chemo dose. -Her recent restaging CT scan unfortunately showed cancer progression in liver, stable disease in pancreas.  I personally reviewed the scan images with patient and her family. -She unfortunately has not had a good response to chemotherapy.  We discussed the next line treatment options, such as FOLFOX, vs supportive care alone and hospice.  After lengthy discussion, patient would like to proceed with Next line chemotherapy.  Will start FOLFOX next week with  dose reduction, due to her existing neuropathy. -I will request NexGen or sequencing TEMPUS on her recent liver biopsy sample, to see if she is a candidate for targeted therapy.  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 She has diabetes also  -overall mild and stable, she mainly has numbness and tingling in her feet.  Pulmonary embolism ([place]) -Staging CT scan from December 25 2022 showed pulmonary embolism  in the left main pulmonary artery extending into the left upper and lower lobe branches. No right-sided pulmonary emboli. No findings for right heart strain.  -Patient was evaluated in the emergency room, no hypoxia or other concerning signs, patient was discharged home with Eliquis loading dose for a week. -She is tolerating well, will continue     PLAN: -Discuss CT scan and discuss changing Chemo regiment to FOLFOX -Pt remain on Eliquis due to cancer diagnose. -Discuss FOLFOX and its side effects.  She agrees to proceed -request genetic testing [facility], lab draw today  -FOLFOX to start next week.   SUMMARY OF ONCOLOGIC HISTORY: Oncology History Overview Note   Cancer Staging  Pancreatic cancer [facility]) Staging form: Exocrine Pancreas, AJCC 8th Edition - Clinical stage from 01/18/2022: Stage IB (cT2, cN0, cM0) - Signed by Truitt Merle, MD on 01/28/2022    Pancreatic cancer ([place])  01/16/2022 Imaging   CLINICAL DATA:  Jaundice   EXAM: CT ABDOMEN AND PELVIS WITH CONTRAST  IMPRESSION: 1. Marked intra and extrahepatic biliary dilatation with marked dilatation of pancreatic duct and distended gallbladder. There is significant atrophy of the body and tail of pancreas. Ill-defined soft tissue mass at the pancreatic  head neck region with marked narrowing of the SMV by ill-defined mass, constellation of findings concerning for neoplasm/pancreas carcinoma. 2. Diverticular disease of the colon without acute wall thickening 3. Mild endometrial thickening up to  9 mm, recommend nonemergent pelvic ultrasound for further evaluation   01/17/2022 Imaging   EXAM: MRI ABDOMEN WITHOUT AND WITH CONTRAST (INCLUDING MRCP)  IMPRESSION: 1. Obstructing mass within the pancreatic head with an intraluminal component demonstrating low level enhancement. 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 2. This mass causes significant extrinsic narrowing at the portal/superior mesenteric vein confluence as well as marked intra and extrahepatic biliary dilatation. 3. No distant metastases identified. 4. Endoscopy for endoscopic ultrasound, tissue sampling and biliary decompression recommended.   01/18/2022 Cancer Staging   Staging form: Exocrine Pancreas, AJCC 8th Edition - Clinical stage from 01/18/2022: Stage IB (cT2, cN0, cM0) - Signed by Truitt Merle, MD on 01/28/2022 Stage prefix: Initial diagnosis Total positive nodes: 0   01/18/2022 Initial Biopsy   FINAL MICROSCOPIC DIAGNOSIS:  A. PANCREAS, HEAD, FINE NEEDLE ASPIRATION:  - Malignant cells present consistent with adenocarcinoma   01/24/2022 Imaging   EXAM: CT CHEST WITHOUT CONTRAST  IMPRESSION: 1. No evidence of metastatic disease in the chest. 2. Atelectasis or consolidation in the right lower lung. 3. Healing right rib fractures. 4. Mass in the head of the pancreas. See previous report of CT abdomen and pelvis 01/16/2022. Interval placement of the biliary stent.   01/28/2022 Initial Diagnosis   Pancreatic cancer ([place])   02/16/2022 - 06/28/2022 Chemotherapy   Patient is on Treatment Plan : PANCREATIC Abraxane / Gemcitabine D1,8 q21d     02/16/2022 Genetic Testing   Negative genetic testing on the CancerNext-Expanded+RNAinsight panel.  The report date is February 16, 2022.  The CancerNext-Expanded gene panel offered by [facility] and includes  sequencing and rearrangement analysis for the following 77 genes: AIP, ALK, APC*, ATM*, AXIN2, BAP1, BARD1, BLM, BMPR1A, BRCA1*, BRCA2*, BRIP1*, CDC73, CDH1*, CDK4, CDKN1B, CDKN2A, CHEK2*, CTNNA1, DICER1, FANCC, FH, FLCN, GALNT12, KIF1B, LZTR1, MAX, MEN1, MET, MLH1*, MSH2*, MSH3, MSH6*, MUTYH*, NBN, NF1*, NF2, NTHL1, PALB2*, PHOX2B, PMS2*, POT1, PRKAR1A, PTCH1, PTEN*, RAD51C*, RAD51D*, RB1, RECQL, RET, SDHA, SDHAF2, SDHB, SDHC, SDHD, SMAD4, SMARCA4, SMARCB1, SMARCE1, STK11, SUFU, TMEM127, TP53*, TSC1, TSC2, VHL and XRCC2 (sequencing and deletion/duplication); EGFR, EGLN1, HOXB13, KIT, MITF, PDGFRA, POLD1, and POLE (sequencing only); EPCAM and GREM1 (deletion/duplication only). DNA and RNA analyses performed for * genes.    06/14/2022 - 09/19/2022 Chemotherapy   Patient is on Treatment Plan : PANCREATIC Abraxane D1,8,15 + Gemcitabine D1,8,15 q28d     10/11/2022 -  Chemotherapy   Patient is on Treatment Plan : PANCREAS Liposomal Irinotecan + Leucovorin + 5-FU IVCI q14d     12/25/2022 Imaging    IMPRESSION: 1. Left-sided pulmonary emboli. No findings for right heart strain. 2. Stable small left upper lobe pulmonary nodules. No new or progressive findings in the chest. 3. Overall stable appearing infiltrating pancreatic neoplasm. 4. New and interval enlargement of hepatic metastatic disease. 5. Biliary stent in place with associated pneumobilia and gas in the gallbladder. Mild to moderate biliary dilatation. 6. 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 Extensive portal venous collateral vessels.        INTERVAL HISTORY:  Miranda Francis is here for a follow up of  pancreatic cancer   She was last seen by  me on 12/06/2022 She presents to the clinic accompanied by husband. Pt went to the hospital and was diagnose for PE in the left lung. Pt was prescribe Eliquis. Pt states she has numbness in her feet, but no tingling.     All other systems were reviewed with the  patient and are negative.  MEDICAL HISTORY:  Past Medical History:  Diagnosis Date   Blood transfusion    Diabetes mellitus    Family history of breast cancer    Family history of colon cancer    Family history of prostate cancer    Heart murmur    Hypertension    pancreatic ca 12/2021    SURGICAL HISTORY: Past Surgical History:  Procedure Laterality Date   BILIARY STENT PLACEMENT  01/18/2022   Procedure: BILIARY STENT PLACEMENT;  Surgeon: Carol Ada, MD;  Location: [place];  Service: Gastroenterology;;   DILATION AND CURETTAGE OF UTERUS     ERCP N/A 01/18/2022   Procedure: ENDOSCOPIC RETROGRADE CHOLANGIOPANCREATOGRAPHY (ERCP);  Surgeon: Carol Ada, MD;  Location: [place];  Service: Gastroenterology;  Laterality: N/A;   ESOPHAGOGASTRODUODENOSCOPY N/A 01/18/2022   Procedure: ESOPHAGOGASTRODUODENOSCOPY (EGD);  Surgeon: Carol Ada, MD;  Location: [place];  Service: Gastroenterology;  Laterality: N/A;   EUS  01/18/2022   Procedure: UPPER ENDOSCOPIC ULTRASOUND (EUS) LINEAR;  Surgeon: Carol Ada, MD;  Location: [facility] ENDOSCOPY;  Service: Gastroenterology;;   FINE NEEDLE ASPIRATION  01/18/2022   Procedure: FINE NEEDLE ASPIRATION (FNA) LINEAR;  Surgeon: Carol Ada, MD;  Location: [facility] ENDOSCOPY;  Service: Gastroenterology;;   IR IMAGING GUIDED PORT INSERTION  02/15/2022   NO PAST SURGERIES     SPHINCTEROTOMY  01/18/2022   Procedure: Joan Mayans;  Surgeon: Carol Ada, MD;  Location: [place];  Service: Gastroenterology;;    I have reviewed the social history and family history with the patient and they are unchanged from previous note.  ALLERGIES:  is allergic to irinotecan liposome.  MEDICATIONS:  Current Outpatient Medications  Medication Sig Dispense Refill   apixaban (ELIQUIS) 5 MG TABS tablet Take 1 tablet (5 mg total) by mouth 2 (two) times daily. 60 tablet 2   acetaminophen (TYLENOL) 500 MG tablet Take 1,000 mg by mouth every 6 (six) hours as needed for  moderate pain or headache.     amLODipine (NORVASC) 5 MG tablet Take 5 mg by mouth daily.     APIXABAN (ELIQUIS) VTE STARTER PACK (10MG AND 5MG) Take as directed on package: start with two-23m tablets twice daily for 7 days. On day 8, switch to one-568mtablet twice daily. 74 each 0   Cholecalciferol (QC VITAMIN D3) 50 MCG (2000 UT) TABS Take 2,000 Units by mouth daily.     Continuous Blood Gluc Sensor (FREESTYLE LIBRE 2 SENSOR) MISC USE 1 SENSOR AND CHANGE EVERY 14 DAYS AS DIRECTED.     dexamethasone (DECADRON) 4 MG tablet Take 2 tablets (8 mg total) by mouth daily. Start the day after irinotecan chemotherapy for 2 days. Take with food. 8 tablet 5   diphenoxylate-atropine (LOMOTIL) 2.5-0.025 MG tablet Take 1 tablet by mouth 4 (four) times daily as needed for diarrhea or loose stools. 30 tablet 0   gabapentin (NEURONTIN) 100 MG capsule Take 2-3 capsules (200-300 mg total) by mouth at bedtime. 90 capsule 0   KLOR-CON M20 20 MEQ tablet Take 20 mEq by mouth 2 (two) times daily.     lidocaine-prilocaine (EMLA) cream Apply 1 application topically as needed. 30 g 1   losartan (COZAAR) 100 MG tablet  Take 100 mg by mouth daily.     mirtazapine (REMERON) 7.5 MG tablet Take 1 tablet (7.5 mg total) by mouth at bedtime. 30 tablet 0   NOVOLOG FLEXPEN 100 UNIT/ML FlexPen Inject 15 Units into the skin 2 (two) times daily.     ondansetron (ZOFRAN) 8 MG tablet Take 1 tablet (8 mg total) by mouth every 8 (eight) hours as needed for nausea or vomiting. Start on the third day after irinotecan 30 tablet 1   oxyCODONE (OXY IR/ROXICODONE) 5 MG immediate release tablet Take 1 tablet (5 mg total) by mouth every 3 (three) hours as needed for severe pain. 20 tablet 0   polyethylene glycol (MIRALAX / GLYCOLAX) 17 g packet Take 17 g by mouth daily. 14 each 0   prochlorperazine (COMPAZINE) 10 MG tablet Take 1 tablet (10 mg total) by mouth every 6 (six) hours as needed for nausea or vomiting. 30 tablet 1   zolpidem (AMBIEN) 10 MG  tablet Take 10 mg by mouth at bedtime as needed.     No current facility-administered medications for this visit.    PHYSICAL EXAMINATION: ECOG PERFORMANCE STATUS: 1 - Symptomatic but completely ambulatory  Vitals:   12/27/22 0902  BP: (!) 142/67  Pulse: 64  Resp: 17  Temp: 97.7 F (36.5 C)  SpO2: 100%   Wt Readings from Last 3 Encounters:  12/27/22 142 lb 14.4 oz (64.8 kg)  12/25/22 143 lb 4.8 oz (65 kg)  12/25/22 143 lb 8 oz (65.1 kg)     GENERAL:alert, no distress and comfortable SKIN: skin color normal, no rashes or significant lesions EYES: normal, Conjunctiva are pink and non-injected, sclera clear  NEURO: alert & oriented x 3 with fluent speech   LABORATORY DATA:  I have reviewed the data as listed    Latest Ref Rng & Units 12/25/2022   12:05 PM 12/06/2022    8:33 AM 11/26/2022   12:15 PM  CBC  WBC 4.0 - 10.5 K/uL 4.8  6.5  5.2   Hemoglobin 12.0 - 15.0 g/dL 10.1  9.4  9.1   Hematocrit 36.0 - 46.0 % 32.1  29.0  28.2   Platelets 150 - 400 K/uL 182  184  143         Latest Ref Rng & Units 12/25/2022   12:05 PM 12/06/2022    8:33 AM 11/16/2022   10:44 AM  CMP  Glucose 70 - 99 mg/dL 71  131  180   BUN 8 - 23 mg/dL 12  12  11   $ Creatinine 0.44 - 1.00 mg/dL 0.59  0.60  0.67   Sodium 135 - 145 mmol/L 140  141  136   Potassium 3.5 - 5.1 mmol/L 3.5  3.8  3.7   Chloride 98 - 111 mmol/L 109  110  108   CO2 22 - 32 mmol/L 24  25  22   $ Calcium 8.9 - 10.3 mg/dL 8.8  8.8  8.4   Total Protein 6.5 - 8.1 g/dL  6.1  5.4   Total Bilirubin 0.3 - 1.2 mg/dL  0.4  0.4   Alkaline Phos 38 - 126 U/L  282  191   AST 15 - 41 U/L  21  10   ALT 0 - 44 U/L  15  11       RADIOGRAPHIC STUDIES: I have personally reviewed the radiological images as listed and agreed with the findings in the report. No results found.    No orders of the defined  types were placed in this encounter.  All questions were answered. The patient knows to call the clinic with any problems, questions or  concerns. No barriers to learning was detected. The total time spent in the appointment was 40 minutes.     Truitt Merle, MD 12/27/2022   Felicity Coyer, CMA, am acting as scribe for Truitt Merle, MD.   I have reviewed the above documentation for accuracy and completeness, and I agree with the above.

## 2022-12-26 NOTE — Telephone Encounter (Signed)
Contacted patient to scheduled appointments. Left message with appointment details and a call back number if patient had any questions or could not accommodate the time we provided.   

## 2022-12-27 ENCOUNTER — Inpatient Hospital Stay: Payer: Medicare HMO

## 2022-12-27 ENCOUNTER — Encounter: Payer: Self-pay | Admitting: Hematology

## 2022-12-27 ENCOUNTER — Other Ambulatory Visit: Payer: Self-pay | Admitting: *Deleted

## 2022-12-27 ENCOUNTER — Inpatient Hospital Stay: Payer: Medicare HMO | Admitting: Hematology

## 2022-12-27 ENCOUNTER — Other Ambulatory Visit: Payer: Self-pay

## 2022-12-27 VITALS — BP 142/67 | HR 64 | Temp 97.7°F | Resp 17 | Ht 62.0 in | Wt 142.9 lb

## 2022-12-27 DIAGNOSIS — Z5111 Encounter for antineoplastic chemotherapy: Secondary | ICD-10-CM | POA: Diagnosis present

## 2022-12-27 DIAGNOSIS — G620 Drug-induced polyneuropathy: Secondary | ICD-10-CM

## 2022-12-27 DIAGNOSIS — Z79899 Other long term (current) drug therapy: Secondary | ICD-10-CM | POA: Diagnosis not present

## 2022-12-27 DIAGNOSIS — C250 Malignant neoplasm of head of pancreas: Secondary | ICD-10-CM

## 2022-12-27 DIAGNOSIS — Z7901 Long term (current) use of anticoagulants: Secondary | ICD-10-CM | POA: Diagnosis not present

## 2022-12-27 DIAGNOSIS — I2699 Other pulmonary embolism without acute cor pulmonale: Secondary | ICD-10-CM | POA: Diagnosis not present

## 2022-12-27 DIAGNOSIS — I2693 Single subsegmental pulmonary embolism without acute cor pulmonale: Secondary | ICD-10-CM

## 2022-12-27 DIAGNOSIS — R160 Hepatomegaly, not elsewhere classified: Secondary | ICD-10-CM | POA: Diagnosis not present

## 2022-12-27 DIAGNOSIS — Z95828 Presence of other vascular implants and grafts: Secondary | ICD-10-CM

## 2022-12-27 DIAGNOSIS — T451X5A Adverse effect of antineoplastic and immunosuppressive drugs, initial encounter: Secondary | ICD-10-CM | POA: Diagnosis not present

## 2022-12-27 MED ORDER — SODIUM CHLORIDE 0.9% FLUSH
10.0000 mL | Freq: Once | INTRAVENOUS | Status: AC
  Administered 2022-12-27: 10 mL

## 2022-12-27 MED ORDER — HEPARIN SOD (PORK) LOCK FLUSH 100 UNIT/ML IV SOLN
500.0000 [IU] | Freq: Once | INTRAVENOUS | Status: AC
  Administered 2022-12-27: 500 [IU]

## 2022-12-27 MED ORDER — APIXABAN 5 MG PO TABS: 5.0000 mg | ORAL_TABLET | Freq: Two times a day (BID) | ORAL | 2 refills | Status: DC

## 2022-12-27 NOTE — Progress Notes (Signed)
[facility] sent on Heme and Surgical Path Case [phone].  Per Dr. Ernestina Penna verbal order.

## 2022-12-27 NOTE — Assessment & Plan Note (Signed)
-  Staging CT scan from December 25 2022 showed pulmonary embolism  in the left main pulmonary artery extending into the left upper and lower lobe branches. No right-sided pulmonary emboli. No findings for right heart strain.  -Patient was evaluated in the emergency room, no hypoxia or other concerning signs, patient was discharged home with Eliquis loading dose for a week. -She is tolerating well, will continue

## 2022-12-27 NOTE — Progress Notes (Signed)
DISCONTINUE OFF PATHWAY REGIMEN - Pancreatic Adenocarcinoma   [id number]:Fluorouracil 1,200 mg/m2/day CIV D1-2 + Leucovorin 400 mg/m2 IV D1 + Liposomal Irinotecan 70 mg/m2 IV D1 q14 Days:   A cycle is every 14 days:     Liposomal irinotecan      Leucovorin      Fluorouracil   **Always confirm dose/schedule in your pharmacy ordering system**  REASON: Disease Progression PRIOR TREATMENT: Off Pathway: Fluorouracil 1,200 mg/m2/day CIV D1-2 + Leucovorin 400 mg/m2 IV D1 + Liposomal Irinotecan 70 mg/m2 IV D1 q14 Days TREATMENT RESPONSE: Progressive Disease ([place])  START ON PATHWAY REGIMEN - Pancreatic Adenocarcinoma     A cycle is every 14 days:     Oxaliplatin      Leucovorin      Fluorouracil      Fluorouracil   **Always confirm dose/schedule in your pharmacy ordering system**  Patient Characteristics: Metastatic Disease, Third Line and Beyond, MSS/pMMR or MSI Unknown Therapeutic Status: Metastatic Disease Line of Therapy: Third Engineer, civil (consulting) Status: MSS/pMMR Intent of Therapy: Non-Curative / Palliative Intent, Discussed with Patient

## 2022-12-27 NOTE — Assessment & Plan Note (Addendum)
stage IV c(T2, N0, M1) with liver metastasis, MMR normal   -Diagnosed in 01/2022. presented to PCP with jaundice for one month. Also complained of epigastric pain, poor appetite, weight loss, and light-colored stool.  -she began neoadjuvant gemcitabine alone on 02/16/22, abraxane was added with C2 (03/08/22).   -abdomen MRI on 06/19/22 showed: subtle liver lesions are stable from MRI in 01/2022 (not reported then); no change in pancreatic mass.  -Dr. Zenia Resides feels she is not a good surgical candidate due to he indeterminate liver lesions and high CA19.9 over one thousand. -restaging CT CAP on 09/24/22 showed: enlargement of two liver lesions; stable pancreatic head mass; no adenopathy.  -I changed her treatment to second line FOLFIRI (5FU pump / liposomal irinotecan) on 10/09/22  -Her recent liver biopsy confirmed metastatic pancreatic cancer, I reviewed with patient. -Due to severe diarrhea and poor tolerance to chemo, I reduced her chemo dose. -Her recent restaging CT scan unfortunately showed cancer progression in liver, stable disease in pancreas.  I personally reviewed the scan images with patient and her family. -She unfortunately has not had a good response to chemotherapy.  We discussed the next line treatment options, such as FOLFOX, vs supportive care alone and hospice.  -I will request NexGen or sequencing [facility] on her recent liver biopsy sample, to see if she is a candidate for targeted therapy.

## 2022-12-27 NOTE — Assessment & Plan Note (Signed)
-  secondary to Abraxane. She has diabetes also  -overall mild and stable

## 2022-12-28 ENCOUNTER — Telehealth: Payer: Self-pay | Admitting: Hematology

## 2022-12-28 NOTE — Telephone Encounter (Signed)
Contacted patient to scheduled appointments. Patient is aware of appointments that are scheduled.   

## 2022-12-29 ENCOUNTER — Inpatient Hospital Stay: Payer: Medicare HMO

## 2022-12-30 ENCOUNTER — Other Ambulatory Visit: Payer: Self-pay

## 2023-01-01 ENCOUNTER — Other Ambulatory Visit (HOSPITAL_BASED_OUTPATIENT_CLINIC_OR_DEPARTMENT_OTHER): Payer: Self-pay

## 2023-01-01 ENCOUNTER — Other Ambulatory Visit: Payer: Self-pay | Admitting: Nurse Practitioner

## 2023-01-01 MED ORDER — GABAPENTIN 100 MG PO CAPS
200.0000 mg | ORAL_CAPSULE | Freq: Every day | ORAL | 0 refills | Status: DC
  Filled 2023-01-01: qty 90, 30d supply, fill #0

## 2023-01-02 MED FILL — Dexamethasone Sodium Phosphate Inj 100 MG/10ML: INTRAMUSCULAR | Qty: 1 | Status: AC

## 2023-01-02 NOTE — Progress Notes (Unsigned)
Patient Care Team: [name], MD as PCP - General (Internal Medicine) [name], MD as PCP - Cardiology (Cardiology) Truitt Merle, MD as Consulting Physician (Oncology)   CHIEF COMPLAINT: Follow-up pancreatic cancer  Oncology History Overview Note   Cancer Staging  Pancreatic cancer [facility]) Staging form: Exocrine Pancreas, AJCC 8th Edition - Clinical stage from 01/18/2022: Stage IB (cT2, cN0, cM0) - Signed by Truitt Merle, MD on 01/28/2022    Pancreatic cancer ([place])  01/16/2022 Imaging   CLINICAL DATA:  Jaundice   EXAM: CT ABDOMEN AND PELVIS WITH CONTRAST  IMPRESSION: 1. Marked intra and extrahepatic biliary dilatation with marked dilatation of pancreatic duct and distended gallbladder. There is significant atrophy of the body and tail of pancreas. 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 2. Diverticular disease of the colon without acute wall thickening 3. Mild endometrial thickening up to 9 mm, recommend nonemergent pelvic ultrasound for further evaluation   01/17/2022 Imaging   EXAM: MRI ABDOMEN WITHOUT AND WITH CONTRAST (INCLUDING MRCP)  IMPRESSION: 1. Obstructing mass within the pancreatic head with an intraluminal component demonstrating low level enhancement. 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 2. This mass causes significant extrinsic narrowing at the portal/superior mesenteric vein confluence as well as marked intra and extrahepatic biliary dilatation. 3. No distant metastases identified. 4. Endoscopy for endoscopic ultrasound, tissue sampling and biliary decompression recommended.   01/18/2022 Cancer Staging   Staging form: Exocrine Pancreas, AJCC  8th Edition - Clinical stage from 01/18/2022: Stage IB (cT2, cN0, cM0) - Signed by Truitt Merle, MD on 01/28/2022 Stage prefix: Initial diagnosis Total positive nodes: 0   01/18/2022 Initial Biopsy   FINAL MICROSCOPIC DIAGNOSIS:  A. PANCREAS, HEAD, FINE NEEDLE ASPIRATION:  - Malignant cells present consistent with adenocarcinoma   01/24/2022 Imaging   EXAM: CT CHEST WITHOUT CONTRAST  IMPRESSION: 1. No evidence of metastatic disease in the chest. 2. Atelectasis or consolidation in the right lower lung. 3. Healing right rib fractures. 4. Mass in the head of the pancreas. See previous report of CT abdomen and pelvis 01/16/2022. Interval placement of the biliary stent.   01/28/2022 Initial Diagnosis   Pancreatic cancer ([place])   02/16/2022 - 06/28/2022 Chemotherapy   Patient is on Treatment Plan : PANCREATIC Abraxane / Gemcitabine D1,8 q21d     02/16/2022 Genetic Testing   Negative genetic testing on the CancerNext-Expanded+RNAinsight panel.  The report date is February 16, 2022.  The CancerNext-Expanded gene panel offered by [facility] and includes sequencing and rearrangement analysis for the following 77 genes: AIP, ALK, APC*, ATM*, AXIN2, BAP1, BARD1, BLM, BMPR1A, BRCA1*, BRCA2*, BRIP1*, CDC73, CDH1*, CDK4, CDKN1B, CDKN2A, CHEK2*, CTNNA1, DICER1, FANCC, FH, FLCN, GALNT12, KIF1B, LZTR1, MAX, MEN1, MET, MLH1*, MSH2*, MSH3, MSH6*, MUTYH*, NBN, NF1*, NF2, NTHL1, PALB2*, PHOX2B, PMS2*, POT1, PRKAR1A, PTCH1, PTEN*, RAD51C*, RAD51D*, RB1, RECQL, RET, SDHA, SDHAF2, SDHB, SDHC, SDHD, SMAD4, SMARCA4, SMARCB1, SMARCE1, STK11, SUFU, TMEM127, TP53*, TSC1, TSC2, VHL and XRCC2 (sequencing and deletion/duplication); EGFR, EGLN1, HOXB13, KIT, MITF, PDGFRA, POLD1, and POLE (sequencing only); EPCAM and GREM1 (deletion/duplication only). DNA and RNA analyses performed for * genes.    06/14/2022 - 09/19/2022 Chemotherapy   Patient is on Treatment Plan : PANCREATIC Abraxane D1,8,15 + Gemcitabine D1,8,15 q28d  10/11/2022 - 12/08/2022 Chemotherapy   Patient is on Treatment Plan : PANCREAS Liposomal Irinotecan + Leucovorin + 5-FU IVCI q14d     12/25/2022 Imaging    IMPRESSION: 1. Left-sided pulmonary emboli. No findings for right heart strain. 2. Stable small left upper lobe pulmonary nodules. No new or progressive findings in the chest. 3. Overall stable appearing infiltrating pancreatic neoplasm. 4. New and interval enlargement of hepatic metastatic disease. 5. Biliary stent in place with associated pneumobilia and gas in the gallbladder. Mild to moderate biliary dilatation. 6. 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 Extensive portal venous collateral vessels.     01/03/2023 -  Chemotherapy   Patient is on Treatment Plan : PANCREAS FOLFOX q14d        CURRENT THERAPY: Pending start of third line [place] returns for follow-up and treatment as scheduled, last seen by Dr. Burr Medico 12/27/2022, at that time due to cancer progression on CT it was recommended to change to FOLFOX planning to start today.  ROS   Past Medical History:  Diagnosis Date   Blood transfusion    Diabetes mellitus    Family history of breast cancer    Family history of colon cancer    Family history of prostate cancer    Heart murmur    Hypertension    pancreatic ca 12/2021     Past Surgical History:  Procedure Laterality Date   BILIARY STENT PLACEMENT  01/18/2022   Procedure: BILIARY STENT PLACEMENT;  Surgeon: Carol Ada, MD;  Location: [place];  Service: Gastroenterology;;   DILATION AND CURETTAGE OF UTERUS     ERCP N/A 01/18/2022   Procedure: ENDOSCOPIC RETROGRADE CHOLANGIOPANCREATOGRAPHY (ERCP);  Surgeon: Carol Ada, MD;  Location: [place];  Service: Gastroenterology;  Laterality: N/A;   ESOPHAGOGASTRODUODENOSCOPY N/A 01/18/2022   Procedure: ESOPHAGOGASTRODUODENOSCOPY (EGD);  Surgeon: Carol Ada, MD;  Location: [place];  Service:  Gastroenterology;  Laterality: N/A;   EUS  01/18/2022   Procedure: UPPER ENDOSCOPIC ULTRASOUND (EUS) LINEAR;  Surgeon: Carol Ada, MD;  Location: [facility] ENDOSCOPY;  Service: Gastroenterology;;   FINE NEEDLE ASPIRATION  01/18/2022   Procedure: FINE NEEDLE ASPIRATION (FNA) LINEAR;  Surgeon: Carol Ada, MD;  Location[facility]COPY;  Service: Gastroenterology;;   IR IMAGING GUIDED PORT INSERTION  02/15/2022   NO PAST SURGERIES     SPHINCTEROTOMY  01/18/2022   Procedure: Joan Mayans;  Surgeon: Carol Ada, MD;  Location: [place];  Service: Gastroenterology;;     Outpatient Encounter Medications as of 01/03/2023  Medication Sig   acetaminophen (TYLENOL) 500 MG tablet Take 1,000 mg by mouth every 6 (six) hours as needed for moderate pain or headache.   amLODipine (NORVASC) 5 MG tablet Take 5 mg by mouth daily.   apixaban (ELIQUIS) 5 MG TABS tablet Take 1 tablet (5 mg total) by mouth 2 (two) times daily.   APIXABAN (ELIQUIS) VTE STARTER PACK (10MG AND 5MG) Take as directed on package: start with two-48m tablets twice daily for 7 days. On day 8, switch to one-587mtablet twice daily.   Cholecalciferol (QC VITAMIN D3) 50 MCG (2000 UT) TABS Take 2,000 Units by mouth daily.   Continuous Blood Gluc Sensor (FREESTYLE LIBRE 2 SENSOR) MISC USE 1 SENSOR AND CHANGE EVERY 14 DAYS AS DIRECTED.   diphenoxylate-atropine (LOMOTIL) 2.5-0.025 MG tablet Take 1 tablet by mouth 4 (four) times daily as needed for diarrhea or loose stools.   gabapentin (NEURONTIN) 100 MG capsule Take 2-3 capsules (200-300 mg total) by  mouth at bedtime.   KLOR-CON M20 20 MEQ tablet Take 20 mEq by mouth 2 (two) times daily.   lidocaine-prilocaine (EMLA) cream Apply 1 application topically as needed.   losartan (COZAAR) 100 MG tablet Take 100 mg by mouth daily.   mirtazapine (REMERON) 7.5 MG tablet Take 1 tablet (7.5 mg total) by mouth at bedtime.   NOVOLOG FLEXPEN 100 UNIT/ML FlexPen Inject 15 Units into the skin 2 (two) times daily.    oxyCODONE (OXY IR/ROXICODONE) 5 MG immediate release tablet Take 1 tablet (5 mg total) by mouth every 3 (three) hours as needed for severe pain.   polyethylene glycol (MIRALAX / GLYCOLAX) 17 g packet Take 17 g by mouth daily.   zolpidem (AMBIEN) 10 MG tablet Take 10 mg by mouth at bedtime as needed.   No facility-administered encounter medications on file as of 01/03/2023.     There were no vitals filed for this visit. There is no height or weight on file to calculate BMI.   PHYSICAL EXAM GENERAL:alert, no distress and comfortable SKIN: no rash  EYES: sclera clear NECK: without mass LYMPH:  no palpable cervical or supraclavicular lymphadenopathy  LUNGS: clear with normal breathing effort HEART: regular rate & rhythm, no lower extremity edema ABDOMEN: abdomen soft, non-tender and normal bowel sounds NEURO: alert & oriented x 3 with fluent speech, no focal motor/sensory deficits Breast exam:  PAC without erythema    CBC    Component Value Date/Time   WBC 4.8 12/25/2022 1205   RBC 3.34 (L) 12/25/2022 1205   HGB 10.1 (L) 12/25/2022 1205   HGB 9.4 (L) 12/06/2022 0833   HCT 32.1 (L) 12/25/2022 1205   PLT 182 12/25/2022 1205   PLT 184 12/06/2022 0833   MCV 96.1 12/25/2022 1205   MCH 30.2 12/25/2022 1205   MCHC 31.5 12/25/2022 1205   RDW 19.5 (H) 12/25/2022 1205   LYMPHSABS 2.3 12/25/2022 1205   MONOABS 0.4 12/25/2022 1205   EOSABS 0.1 12/25/2022 1205   BASOSABS 0.0 12/25/2022 1205     CMP     Component Value Date/Time   NA 140 12/25/2022 1205   K 3.5 12/25/2022 1205   CL 109 12/25/2022 1205   CO2 24 12/25/2022 1205   GLUCOSE 71 12/25/2022 1205   BUN 12 12/25/2022 1205   CREATININE 0.59 12/25/2022 1205   CREATININE 0.60 12/06/2022 0833   CALCIUM 8.8 (L) 12/25/2022 1205   PROT 6.1 (L) 12/06/2022 0833   ALBUMIN 3.1 (L) 12/06/2022 0833   AST 21 12/06/2022 0833   ALT 15 12/06/2022 0833   ALKPHOS 282 (H) 12/06/2022 0833   BILITOT 0.4 12/06/2022 0833   GFRNONAA >60  12/25/2022 1205   GFRNONAA >60 12/06/2022 0833   GFRAA >60 10/31/2018 0937     ASSESSMENT & PLAN:  PLAN:  No orders of the defined types were placed in this encounter.     All questions were answered. The patient knows to call the clinic with any problems, questions or concerns. No barriers to learning were detected. I spent *** counseling the patient face to face. The total time spent in the appointment was *** and more than 50% was on counseling, review of test results, and coordination of care.   Cira Rue, NP-C @DATE$ @

## 2023-01-03 ENCOUNTER — Ambulatory Visit: Payer: Medicare HMO

## 2023-01-03 ENCOUNTER — Inpatient Hospital Stay: Payer: Medicare HMO

## 2023-01-03 ENCOUNTER — Other Ambulatory Visit: Payer: Self-pay

## 2023-01-03 ENCOUNTER — Other Ambulatory Visit: Payer: Medicare HMO

## 2023-01-03 ENCOUNTER — Encounter: Payer: Self-pay | Admitting: Nurse Practitioner

## 2023-01-03 ENCOUNTER — Inpatient Hospital Stay: Payer: Medicare HMO | Admitting: Nurse Practitioner

## 2023-01-03 ENCOUNTER — Ambulatory Visit: Payer: Medicare HMO | Admitting: Hematology

## 2023-01-03 VITALS — BP 151/94 | HR 64 | Resp 14

## 2023-01-03 DIAGNOSIS — C250 Malignant neoplasm of head of pancreas: Secondary | ICD-10-CM | POA: Diagnosis not present

## 2023-01-03 DIAGNOSIS — Z95828 Presence of other vascular implants and grafts: Secondary | ICD-10-CM

## 2023-01-03 DIAGNOSIS — Z5111 Encounter for antineoplastic chemotherapy: Secondary | ICD-10-CM | POA: Diagnosis not present

## 2023-01-03 LAB — CBC WITH DIFFERENTIAL (CANCER CENTER ONLY)
Abs Immature Granulocytes: 0.01 10*3/uL (ref 0.00–0.07)
Basophils Absolute: 0 10*3/uL (ref 0.0–0.1)
Basophils Relative: 1 %
Eosinophils Absolute: 0.1 10*3/uL (ref 0.0–0.5)
Eosinophils Relative: 1 %
HCT: 30 % — ABNORMAL LOW (ref 36.0–46.0)
Hemoglobin: 10.1 g/dL — ABNORMAL LOW (ref 12.0–15.0)
Immature Granulocytes: 0 %
Lymphocytes Relative: 33 %
Lymphs Abs: 1.8 10*3/uL (ref 0.7–4.0)
MCH: 31.1 pg (ref 26.0–34.0)
MCHC: 33.7 g/dL (ref 30.0–36.0)
MCV: 92.3 fL (ref 80.0–100.0)
Monocytes Absolute: 0.5 10*3/uL (ref 0.1–1.0)
Monocytes Relative: 8 %
Neutro Abs: 3.2 10*3/uL (ref 1.7–7.7)
Neutrophils Relative %: 57 %
Platelet Count: 159 10*3/uL (ref 150–400)
RBC: 3.25 MIL/uL — ABNORMAL LOW (ref 3.87–5.11)
RDW: 18.6 % — ABNORMAL HIGH (ref 11.5–15.5)
WBC Count: 5.6 10*3/uL (ref 4.0–10.5)
nRBC: 0 % (ref 0.0–0.2)

## 2023-01-03 LAB — CMP (CANCER CENTER ONLY)
ALT: 21 U/L (ref 0–44)
AST: 29 U/L (ref 15–41)
Albumin: 3.5 g/dL (ref 3.5–5.0)
Alkaline Phosphatase: 290 U/L — ABNORMAL HIGH (ref 38–126)
Anion gap: 6 (ref 5–15)
BUN: 10 mg/dL (ref 8–23)
CO2: 24 mmol/L (ref 22–32)
Calcium: 8.6 mg/dL — ABNORMAL LOW (ref 8.9–10.3)
Chloride: 108 mmol/L (ref 98–111)
Creatinine: 0.5 mg/dL (ref 0.44–1.00)
GFR, Estimated: >60 mL/min (ref >60)
Glucose, Bld: 199 mg/dL — ABNORMAL HIGH (ref 70–99)
Potassium: 3.8 mmol/L (ref 3.5–5.1)
Sodium: 138 mmol/L (ref 135–145)
Total Bilirubin: 0.4 mg/dL (ref 0.3–1.2)
Total Protein: 6.4 g/dL — ABNORMAL LOW (ref 6.5–8.1)

## 2023-01-03 MED ORDER — LIDOCAINE-PRILOCAINE 2.5-2.5 % EX CREA: TOPICAL_CREAM | CUTANEOUS | 3 refills | Status: DC

## 2023-01-03 MED ORDER — OXALIPLATIN CHEMO INJECTION 100 MG/20ML
70.0000 mg/m2 | Freq: Once | INTRAVENOUS | Status: AC
  Administered 2023-01-03: 120 mg via INTRAVENOUS
  Filled 2023-01-03: qty 3.55

## 2023-01-03 MED ORDER — SODIUM CHLORIDE 0.9 % IV SOLN
2000.0000 mg/m2 | INTRAVENOUS | Status: DC
  Administered 2023-01-03: 3500 mg via INTRAVENOUS
  Filled 2023-01-03: qty 70

## 2023-01-03 MED ORDER — LEUCOVORIN CALCIUM INJECTION 350 MG
400.0000 mg/m2 | Freq: Once | INTRAVENOUS | Status: AC
  Administered 2023-01-03: 672 mg via INTRAVENOUS
  Filled 2023-01-03: qty 33.6

## 2023-01-03 MED ORDER — PROCHLORPERAZINE MALEATE 10 MG PO TABS: 10.0000 mg | ORAL_TABLET | Freq: Four times a day (QID) | ORAL | 1 refills | Status: DC | PRN

## 2023-01-03 MED ORDER — DEXTROSE 5 % IV SOLN: Freq: Once | INTRAVENOUS | Status: AC

## 2023-01-03 MED ORDER — DEXAMETHASONE 4 MG PO TABS: 8.0000 mg | ORAL_TABLET | Freq: Every day | ORAL | 1 refills | Status: DC

## 2023-01-03 MED ORDER — PALONOSETRON HCL INJECTION 0.25 MG/5ML
0.2500 mg | Freq: Once | INTRAVENOUS | Status: AC
  Administered 2023-01-03: 0.25 mg via INTRAVENOUS
  Filled 2023-01-03: qty 5

## 2023-01-03 MED ORDER — SODIUM CHLORIDE 0.9% FLUSH
10.0000 mL | INTRAVENOUS | Status: DC | PRN
  Administered 2023-01-03: 10 mL

## 2023-01-03 MED ORDER — ONDANSETRON HCL 8 MG PO TABS: 8.0000 mg | ORAL_TABLET | Freq: Three times a day (TID) | ORAL | 1 refills | Status: DC | PRN

## 2023-01-03 MED ORDER — SODIUM CHLORIDE 0.9 % IV SOLN
10.0000 mg | Freq: Once | INTRAVENOUS | Status: AC
  Administered 2023-01-03: 10 mg via INTRAVENOUS
  Filled 2023-01-03: qty 10

## 2023-01-03 MED ORDER — SODIUM CHLORIDE 0.9% FLUSH
10.0000 mL | Freq: Once | INTRAVENOUS | Status: AC
  Administered 2023-01-03: 10 mL

## 2023-01-03 NOTE — Progress Notes (Signed)
VO to decrease dose to 2000 mg/m2 this first cycle to test tolerance.  May increase C2 of pump in FOLFOX.

## 2023-01-03 NOTE — Progress Notes (Signed)
During Pt's infusion appointment today Pt notified this RN that her CBG's were increasing. Pt stated "My sugar is reading in the 200's now and it never runs this high". When this RN asked the Pt what her monitor was reading she stated "267". This RN made Lacie NP aware. Lacie NP recommended Pt to closely monitor and track her blood sugars at home. This RN educated Pt on the recommendation. Pt was agreeable to track her blood sugar numbers and verbalized understanding.

## 2023-01-03 NOTE — Patient Instructions (Signed)
Miranda Francis  Discharge Instructions: Thank you for choosing [facility] to provide your oncology and hematology care.   If you have a lab appointment with the [facility], please go directly to the [facility] and check in at the registration area.   Wear comfortable clothing and clothing appropriate for easy access to any Portacath or PICC line.   We strive to give you quality time with your provider. You may need to reschedule your appointment if you arrive late (15 or more minutes).  Arriving late affects you and other patients whose appointments are after yours.  Also, if you miss three or more appointments without notifying the office, you may be dismissed from the clinic at the provider's discretion.      For prescription refill requests, have your pharmacy contact our office and allow 72 hours for refills to be completed.    Today you received the following chemotherapy and/or immunotherapy agents: Oxaliplatin/Leucovorin/Fluorouracil      To help prevent nausea and vomiting after your treatment, we encourage you to take your nausea medication as directed.  BELOW ARE SYMPTOMS THAT SHOULD BE REPORTED IMMEDIATELY: *FEVER GREATER THAN 100.4 F (38 C) OR HIGHER *CHILLS OR SWEATING *NAUSEA AND VOMITING THAT IS NOT CONTROLLED WITH YOUR NAUSEA MEDICATION *UNUSUAL SHORTNESS OF BREATH *UNUSUAL BRUISING OR BLEEDING *URINARY PROBLEMS (pain or burning when urinating, or frequent urination) *BOWEL PROBLEMS (unusual diarrhea, constipation, pain near the anus) TENDERNESS IN MOUTH AND THROAT WITH OR WITHOUT PRESENCE OF ULCERS (sore throat, sores in mouth, or a toothache) UNUSUAL RASH, SWELLING OR PAIN  UNUSUAL VAGINAL DISCHARGE OR ITCHING   Items with * indicate a potential emergency and should be followed up as soon as possible or go to the Emergency Department if any problems should occur.  Please show the CHEMOTHERAPY ALERT CARD or  IMMUNOTHERAPY ALERT CARD at check-in to the Emergency Department and triage nurse.  Should you have questions after your visit or need to cancel or reschedule your appointment, please contact [facility]  Dept: [phone]  and follow the prompts.  Office hours are 8:00 a.m. to 4:30 p.m. Monday - Friday. Please note that voicemails left after 4:00 p.m. may not be returned until the following business day.  We are closed weekends and major holidays. You have access to a nurse at all times for urgent questions. Please call the main number to the clinic Dept: [phone] and follow the prompts.   For any non-urgent questions, you may also contact your provider using MyChart. We now offer e-Visits for anyone 47 and older to request care online for non-urgent symptoms. For details visit [email or web address].   Also download the MyChart app! Go to the app store, search "MyChart", open the app, select [facility], and log in with your MyChart username and password.  The chemotherapy medication bag should finish at 46 hours, 96 hours, or 7 days. For example, if your pump is scheduled for 46 hours and it was put on at 4:00 p.m., it should finish at 2:00 p.m. the day it is scheduled to come off regardless of your appointment time.     Estimated time to finish at approximately 11:15 on 01/05/23.   If the display on your pump reads "Low Volume" and it is beeping, take the batteries out of the pump and come to the cancer center for it to be taken off.   If the pump alarms go off  prior to the pump reading "Low Volume" then call [phone] and someone can assist you.  If the plunger comes out and the chemotherapy medication is leaking out, please use your home chemo spill kit to clean up the spill. Do NOT use paper towels or other household products.  If you have problems or questions regarding your pump, please call either [phone] (24 hours a day) or the  cancer center Monday-Friday 8:00 a.m.- 4:30 p.m. at the clinic number and we will assist you. If you are unable to get assistance, then go to the nearest Emergency Department and ask the staff to contact the IV team for assistance.

## 2023-01-05 ENCOUNTER — Inpatient Hospital Stay: Payer: Medicare HMO

## 2023-01-05 ENCOUNTER — Other Ambulatory Visit: Payer: Self-pay

## 2023-01-05 VITALS — BP 155/84 | HR 62 | Temp 97.7°F | Resp 16

## 2023-01-05 DIAGNOSIS — C250 Malignant neoplasm of head of pancreas: Secondary | ICD-10-CM

## 2023-01-05 DIAGNOSIS — Z5111 Encounter for antineoplastic chemotherapy: Secondary | ICD-10-CM | POA: Diagnosis not present

## 2023-01-05 LAB — CANCER ANTIGEN 19-9: CA 19-9: 55878 U/mL — ABNORMAL HIGH (ref 0–35)

## 2023-01-05 MED ORDER — SODIUM CHLORIDE 0.9% FLUSH
10.0000 mL | INTRAVENOUS | Status: DC | PRN
  Administered 2023-01-05: 10 mL

## 2023-01-05 MED ORDER — HEPARIN SOD (PORK) LOCK FLUSH 100 UNIT/ML IV SOLN
500.0000 [IU] | Freq: Once | INTRAVENOUS | Status: AC | PRN
  Administered 2023-01-05: 500 [IU]

## 2023-01-07 ENCOUNTER — Telehealth: Payer: Self-pay | Admitting: *Deleted

## 2023-01-07 ENCOUNTER — Other Ambulatory Visit: Payer: Self-pay

## 2023-01-07 ENCOUNTER — Telehealth: Payer: Self-pay

## 2023-01-07 NOTE — Telephone Encounter (Signed)
-----   Message from Willis Modena, RN sent at 01/03/2023  1:37 PM EST ----- Regarding: Dr. Burr Medico 1st time Folfox f/u Pt tol well Dr. Burr Medico 1st time Oxaliplatin/Leucovorin/Fluorouracil Pt tolerated tx well without incident. Pt call back due. Can you please check on Pt's blood sugars they started to rise during tx and Lacie wants her to track them at home.

## 2023-01-07 NOTE — Telephone Encounter (Signed)
Called pt to see how she did with her treatment.  She reports being nauseated on Saturday when she had pump removed.  She took nausea med when she got home.  She has taken her nausea med daily since to keep from getting nauseated.  Encouraged if not nauseated to only take if needed.  She took the compazine.  Discussed compazine & Zofran.  Discussed cold sensitivity,  Asked about blood sugars & she reports running 108-184, no 200's.  She did not take her decadron b/c she didn't have any.  She thinks it was sent to another pharmacy.  I told her not to take now unless Dr Burr Medico tells her too.  Message to Dr Stanford Breed.

## 2023-01-07 NOTE — Telephone Encounter (Signed)
Rec'd a message from Jesse Fall, RN in [employer] regarding f/u telephone call post 1st treatment.  Myrtle stated pt was not taking the Dexamethasone (Decadron) d/t prescription sent to different pharmacy.  Contacted pt, LVM stating Dr. Ernestina Penna office was informed by [facility] in [place] [place] that the pt does not have the Dexamethasone prescription d/t prescription was sent to a different pharmacy.  Requested pt to contact Dr. Ernestina Penna office to inform if the prescription for Dexamethasone needs to be sent to [facility].  Awaiting pt's return call.  Pt returned call and stated she is going today to pickup the prescription from [facility] today and asked if she should take the Dexamethasone.  Asked pt if she's still having nausea/vomiting.  Pt denied n/v.  Instructed pt to start the Dexamethasone after her next cycle since she's no longer have n/v.  Pt verbalized understanding and had no further questions or concerns.

## 2023-01-09 ENCOUNTER — Other Ambulatory Visit: Payer: Self-pay

## 2023-01-09 NOTE — Progress Notes (Signed)
Email received regarding pt's [facility] solid tissue analysis sent on 01/09/2023.  Hi Aruna Nestler,    Please see below an order delay [facility] is experiencing with the following order for Dr. Truitt Merle. If you have any additional questions, please reach out to [facility] and she will be happy to assist you. Updates and results can also be found on the [facility] Clinical Portal.    Order Date  Patient Name  Patient DOB  Panel Type  Order Status  Report ETA Range (subject to change)  Reason for Delay  2/15  Miranda Francis  2046-05-08  xT  Delayed in our Lab  3/4 - 3/5  This sample has experienced multiple failures prior to sequencing, and has been unable to progress to sequencing. We will continue to process the sample until there is no remaining material, but there is a high degree of likelihood that this sample will be insufficient for sequencing.    In the meantime, since there is a likelihood that tissue will be found insufficient, if you are aware of an alternate pathology report please send it to [place] and we will be happy to request that for you.  Thank you, Aquilla Solian Just Baudette. [email or web address] Phone: [phone] Fax: [phone]

## 2023-01-10 ENCOUNTER — Other Ambulatory Visit: Payer: Medicare HMO

## 2023-01-10 ENCOUNTER — Ambulatory Visit: Payer: Medicare HMO

## 2023-01-10 ENCOUNTER — Ambulatory Visit: Payer: Medicare HMO | Admitting: Hematology

## 2023-01-15 ENCOUNTER — Encounter: Payer: Self-pay | Admitting: Hematology

## 2023-01-16 MED FILL — Dexamethasone Sodium Phosphate Inj 100 MG/10ML: INTRAMUSCULAR | Qty: 1 | Status: AC

## 2023-01-17 ENCOUNTER — Telehealth: Payer: Self-pay | Admitting: Hematology

## 2023-01-17 ENCOUNTER — Inpatient Hospital Stay: Payer: Medicare HMO

## 2023-01-17 ENCOUNTER — Inpatient Hospital Stay: Payer: Medicare HMO | Admitting: Hematology

## 2023-01-17 NOTE — Telephone Encounter (Signed)
Called patient to rescheduled missed appointments. Patient stated she will just come back for her next scheduled cycle for now.

## 2023-01-17 NOTE — Progress Notes (Deleted)
[facility]   Telephone:([phone]) [phone] Fax:(336) [phone]   Clinic Follow up Note   Patient Care Team: [name], MD as PCP - General (Internal Medicine) [name], MD as PCP - Cardiology (Cardiology) Truitt Merle, MD as Consulting Physician (Oncology)  Date of Service:  01/17/2023  CHIEF COMPLAINT: f/u of pancreatic cancer   CURRENT THERAPY: FOLFOX q14d   ASSESSMENT: *** Miranda Francis is a 77 y.o. female with   No problem-specific Assessment & Plan notes found for this encounter.  ***   PLAN: {Everything Dr. Burr Medico talks to pt about, including reviewing scans and labs. } -{proceed with ***} -{lab with/without flush and f/u when?}   SUMMARY OF ONCOLOGIC HISTORY: Oncology History Overview Note   Cancer Staging  Pancreatic cancer [facility]) Staging form: Exocrine Pancreas, AJCC 8th Edition - Clinical stage from 01/18/2022: Stage IB (cT2, cN0, cM0) - Signed by Truitt Merle, MD on 01/28/2022    Pancreatic cancer ([place])  01/16/2022 Imaging   CLINICAL DATA:  Jaundice   EXAM: CT ABDOMEN AND PELVIS WITH CONTRAST  IMPRESSION: 1. Marked intra and extrahepatic biliary dilatation with marked dilatation of pancreatic duct and distended gallbladder. There is significant atrophy of the body and tail of pancreas. 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 2. Diverticular disease of the colon without acute wall thickening 3. Mild endometrial thickening up to 9 mm, recommend nonemergent pelvic ultrasound for further evaluation   01/17/2022 Imaging   EXAM: MRI ABDOMEN WITHOUT AND WITH CONTRAST (INCLUDING MRCP)  IMPRESSION: 1. Obstructing mass within the pancreatic head with an intraluminal component demonstrating low level enhancement. 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 2. This mass causes significant extrinsic narrowing at the portal/superior mesenteric vein confluence as well as marked intra and extrahepatic biliary dilatation. 3. No distant metastases identified. 4. Endoscopy for endoscopic ultrasound, tissue sampling and biliary decompression recommended.   01/18/2022 Cancer Staging   Staging form: Exocrine Pancreas, AJCC 8th Edition - Clinical stage from 01/18/2022: Stage IB (cT2, cN0, cM0) - Signed by Truitt Merle, MD on 01/28/2022 Stage prefix: Initial diagnosis Total positive nodes: 0   01/18/2022 Initial Biopsy   FINAL MICROSCOPIC DIAGNOSIS:  A. PANCREAS, HEAD, FINE NEEDLE ASPIRATION:  - Malignant cells present consistent with adenocarcinoma   01/24/2022 Imaging   EXAM: CT CHEST WITHOUT CONTRAST  IMPRESSION: 1. No evidence of metastatic disease in the chest. 2. Atelectasis or consolidation in the right lower lung. 3. Healing right rib fractures. 4. Mass in the head of the pancreas. See previous report of CT abdomen and pelvis 01/16/2022. Interval placement of the biliary stent.   01/28/2022 Initial Diagnosis   Pancreatic cancer ([place])   02/16/2022 - 06/28/2022 Chemotherapy   Patient is on Treatment Plan : PANCREATIC Abraxane / Gemcitabine D1,8 q21d     02/16/2022 Genetic Testing   Negative genetic testing on the CancerNext-Expanded+RNAinsight panel.  The report date is February 16, 2022.  The CancerNext-Expanded gene panel offered by [facility] and includes sequencing and rearrangement analysis for the following 77 genes: AIP, ALK, APC*, ATM*, AXIN2, BAP1, BARD1, BLM, BMPR1A, BRCA1*, BRCA2*, BRIP1*, CDC73, CDH1*, CDK4, CDKN1B, CDKN2A, CHEK2*, CTNNA1, DICER1, FANCC, FH, FLCN, GALNT12, KIF1B, LZTR1, MAX, MEN1, MET, MLH1*, MSH2*, MSH3, MSH6*, MUTYH*, NBN, NF1*, NF2, NTHL1,  PALB2*, PHOX2B, PMS2*, POT1, PRKAR1A, PTCH1, PTEN*,  RAD51C*, RAD51D*, RB1, RECQL, RET, SDHA, SDHAF2, SDHB, SDHC, SDHD, SMAD4, SMARCA4, SMARCB1, SMARCE1, STK11, SUFU, TMEM127, TP53*, TSC1, TSC2, VHL and XRCC2 (sequencing and deletion/duplication); EGFR, EGLN1, HOXB13, KIT, MITF, PDGFRA, POLD1, and POLE (sequencing only); EPCAM and GREM1 (deletion/duplication only). DNA and RNA analyses performed for * genes.    06/14/2022 - 09/19/2022 Chemotherapy   Patient is on Treatment Plan : PANCREATIC Abraxane D1,8,15 + Gemcitabine D1,8,15 q28d     10/11/2022 - 12/08/2022 Chemotherapy   Patient is on Treatment Plan : PANCREAS Liposomal Irinotecan + Leucovorin + 5-FU IVCI q14d     12/25/2022 Imaging    IMPRESSION: 1. Left-sided pulmonary emboli. No findings for right heart strain. 2. Stable small left upper lobe pulmonary nodules. No new or progressive findings in the chest. 3. Overall stable appearing infiltrating pancreatic neoplasm. 4. New and interval enlargement of hepatic metastatic disease. 5. Biliary stent in place with associated pneumobilia and gas in the gallbladder. Mild to moderate biliary dilatation. 6. 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 Extensive portal venous collateral vessels.     01/03/2023 -  Chemotherapy   Patient is on Treatment Plan : PANCREAS FOLFOX q14d        INTERVAL HISTORY: *** Miranda Francis is here for a follow up of pancreatic cancer  She was last seen by  NP Lacie on 01/03/2023 She presents to the clinic       All other systems were reviewed with the patient and are negative.  MEDICAL HISTORY:  Past Medical History:  Diagnosis Date   Blood transfusion    Diabetes mellitus    Family history of breast cancer    Family history of colon cancer    Family history of prostate cancer    Heart murmur    Hypertension    pancreatic ca 12/2021    SURGICAL HISTORY: Past Surgical History:  Procedure Laterality Date   BILIARY STENT PLACEMENT  01/18/2022   Procedure: BILIARY STENT  PLACEMENT;  Surgeon: Carol Ada, MD;  Location: [place];  Service: Gastroenterology;;   DILATION AND CURETTAGE OF UTERUS     ERCP N/A 01/18/2022   Procedure: ENDOSCOPIC RETROGRADE CHOLANGIOPANCREATOGRAPHY (ERCP);  Surgeon: Carol Ada, MD;  Location: [place];  Service: Gastroenterology;  Laterality: N/A;   ESOPHAGOGASTRODUODENOSCOPY N/A 01/18/2022   Procedure: ESOPHAGOGASTRODUODENOSCOPY (EGD);  Surgeon: Carol Ada, MD;  Location: [place];  Service: Gastroenterology;  Laterality: N/A;   EUS  01/18/2022   Procedure: UPPER ENDOSCOPIC ULTRASOUND (EUS) LINEAR;  Surgeon: Carol Ada, MD;  Location[facility] ENDOSCOPY;  Service: Gastroenterology;;   FINE NEEDLE ASPIRATION  01/18/2022   Procedure: FINE NEEDLE ASPIRATION (FNA) LINEAR;  Surgeon: Carol Ada, MD;  Location: [facility] ENDOSCOPY;  Service: Gastroenterology;;   IR IMAGING GUIDED PORT INSERTION  02/15/2022   NO PAST SURGERIES     SPHINCTEROTOMY  01/18/2022   Procedure: Joan Mayans;  Surgeon: Carol Ada, MD;  Location: [place];  Service: Gastroenterology;;    I have reviewed the social history and family history with the patient and they are unchanged from previous note.  ALLERGIES:  is allergic to irinotecan liposome.  MEDICATIONS:  Current Outpatient Medications  Medication Sig Dispense Refill   acetaminophen (TYLENOL) 500 MG tablet Take 1,000 mg by mouth every 6 (six) hours as needed for moderate pain or headache.     amLODipine (NORVASC) 5 MG tablet Take 5 mg by mouth daily.     apixaban (ELIQUIS) 5 MG TABS tablet Take  1 tablet (5 mg total) by mouth 2 (two) times daily. 60 tablet 2   APIXABAN (ELIQUIS) VTE STARTER PACK ('10MG'$  AND '5MG'$ ) Take as directed on package: start with two-'5mg'$  tablets twice daily for 7 days. On day 8, switch to one-'5mg'$  tablet twice daily. 74 each 0   Cholecalciferol (QC VITAMIN D3) 50 MCG (2000 UT) TABS Take 2,000 Units by mouth daily.     Continuous Blood Gluc Sensor (FREESTYLE LIBRE 2 SENSOR) MISC  USE 1 SENSOR AND CHANGE EVERY 14 DAYS AS DIRECTED.     dexamethasone (DECADRON) 4 MG tablet Take 2 tablets (8 mg total) by mouth daily. Start the day after chemotherapy for 2 days. Take with food. 30 tablet 1   diphenoxylate-atropine (LOMOTIL) 2.5-0.025 MG tablet Take 1 tablet by mouth 4 (four) times daily as needed for diarrhea or loose stools. 30 tablet 0   gabapentin (NEURONTIN) 100 MG capsule Take 2-3 capsules (200-300 mg total) by mouth at bedtime. 90 capsule 0   KLOR-CON M20 20 MEQ tablet Take 20 mEq by mouth 2 (two) times daily.     lidocaine-prilocaine (EMLA) cream Apply 1 application topically as needed. 30 g 1   lidocaine-prilocaine (EMLA) cream Apply to affected area once 30 g 3   losartan (COZAAR) 100 MG tablet Take 100 mg by mouth daily.     mirtazapine (REMERON) 7.5 MG tablet Take 1 tablet (7.5 mg total) by mouth at bedtime. 30 tablet 0   NOVOLOG FLEXPEN 100 UNIT/ML FlexPen Inject 15 Units into the skin 2 (two) times daily.     ondansetron (ZOFRAN) 8 MG tablet Take 1 tablet (8 mg total) by mouth every 8 (eight) hours as needed for nausea or vomiting. Start on the third day after chemotherapy. 30 tablet 1   oxyCODONE (OXY IR/ROXICODONE) 5 MG immediate release tablet Take 1 tablet (5 mg total) by mouth every 3 (three) hours as needed for severe pain. 20 tablet 0   polyethylene glycol (MIRALAX / GLYCOLAX) 17 g packet Take 17 g by mouth daily. 14 each 0   prochlorperazine (COMPAZINE) 10 MG tablet Take 1 tablet (10 mg total) by mouth every 6 (six) hours as needed for nausea or vomiting. 30 tablet 1   zolpidem (AMBIEN) 10 MG tablet Take 10 mg by mouth at bedtime as needed.     No current facility-administered medications for this visit.    PHYSICAL EXAMINATION: ECOG PERFORMANCE STATUS: {CHL ONC ECOG PS:[phone]}  There were no vitals filed for this visit. Wt Readings from Last 3 Encounters:  01/03/23 142 lb 12.8 oz (64.8 kg)  12/27/22 142 lb 14.4 oz (64.8 kg)  12/25/22 143 lb 4.8  oz (65 kg)    {Only keep what was examined. If exam not performed, can use .CEXAM } GENERAL:alert, no distress and comfortable SKIN: skin color, texture, turgor are normal, no rashes or significant lesions EYES: normal, Conjunctiva are pink and non-injected, sclera clear {OROPHARYNX:no exudate, no erythema and lips, buccal mucosa, and tongue normal}  NECK: supple, thyroid normal size, non-tender, without nodularity LYMPH:  no palpable lymphadenopathy in the cervical, axillary {or inguinal} LUNGS: clear to auscultation and percussion with normal breathing effort HEART: regular rate & rhythm and no murmurs and no lower extremity edema ABDOMEN:abdomen soft, non-tender and normal bowel sounds Musculoskeletal:no cyanosis of digits and no clubbing  NEURO: alert & oriented x 3 with fluent speech, no focal motor/sensory deficits  LABORATORY DATA:  I have reviewed the data as listed    Latest Ref Rng &  Units 01/03/2023    8:42 AM 12/25/2022   12:05 PM 12/06/2022    8:33 AM  CBC  WBC 4.0 - 10.5 K/uL 5.6  4.8  6.5   Hemoglobin 12.0 - 15.0 g/dL 10.1  10.1  9.4   Hematocrit 36.0 - 46.0 % 30.0  32.1  29.0   Platelets 150 - 400 K/uL 159  182  184         Latest Ref Rng & Units 01/03/2023    8:42 AM 12/25/2022   12:05 PM 12/06/2022    8:33 AM  CMP  Glucose 70 - 99 mg/dL 199  71  131   BUN 8 - 23 mg/dL '10  12  12   '$ Creatinine 0.44 - 1.00 mg/dL 0.50  0.59  0.60   Sodium 135 - 145 mmol/L 138  140  141   Potassium 3.5 - 5.1 mmol/L 3.8  3.5  3.8   Chloride 98 - 111 mmol/L 108  109  110   CO2 22 - 32 mmol/L '24  24  25   '$ Calcium 8.9 - 10.3 mg/dL 8.6  8.8  8.8   Total Protein 6.5 - 8.1 g/dL 6.4   6.1   Total Bilirubin 0.3 - 1.2 mg/dL 0.4   0.4   Alkaline Phos 38 - 126 U/L 290   282   AST 15 - 41 U/L 29   21   ALT 0 - 44 U/L 21   15       RADIOGRAPHIC STUDIES: I have personally reviewed the radiological images as listed and agreed with the findings in the report. No results found.    No  orders of the defined types were placed in this encounter.  All questions were answered. The patient knows to call the clinic with any problems, questions or concerns. No barriers to learning was detected. The total time spent in the appointment was {CHL ONC TIME VISIT - [id number].     Baldemar Friday, CMA 01/17/2023   I, Audry Riles, CMA, am acting as scribe for Truitt Merle, MD.   {Add scribe attestation statement}

## 2023-01-18 ENCOUNTER — Encounter (HOSPITAL_COMMUNITY): Payer: Self-pay | Admitting: Hematology

## 2023-01-19 ENCOUNTER — Inpatient Hospital Stay: Payer: Medicare HMO

## 2023-01-25 ENCOUNTER — Encounter (HOSPITAL_COMMUNITY): Payer: Self-pay | Admitting: Hematology

## 2023-01-30 ENCOUNTER — Other Ambulatory Visit: Payer: Self-pay | Admitting: Hematology

## 2023-01-30 DIAGNOSIS — C250 Malignant neoplasm of head of pancreas: Secondary | ICD-10-CM

## 2023-01-30 NOTE — Assessment & Plan Note (Signed)
-  Staging CT scan from December 25 2022 showed pulmonary embolism  in the left main pulmonary artery extending into the left upper and lower lobe branches. No right-sided pulmonary emboli. No findings for right heart strain.  -Patient was evaluated in the emergency room, no hypoxia or other concerning signs, patient was discharged home with Eliquis loading dose for a week. -She is tolerating well, will continue indefinitely

## 2023-01-30 NOTE — Assessment & Plan Note (Signed)
stage IV c(T2, N0, M1) with liver metastasis, MMR normal   -Diagnosed in 01/2022. presented to PCP with jaundice for one month. Also complained of epigastric pain, poor appetite, weight loss, and light-colored stool.  -she began neoadjuvant gemcitabine alone on 02/16/22, abraxane was added with C2 (03/08/22).   -liver biopsy confirmed metastatic cancer  -Due to cancer progression, chemo changed to second line FOLFIRI on November 2023 -She unfortunately did not respond well to chemo, restaging scan from February 2024 showed disease progression in liver again -She was changed to third line chemotherapy FOLFOX on 01/03/23 -I reviewed her NexGen or sequencing TEMPUS result, unfortunately there is no targetable therapy.

## 2023-01-30 NOTE — Assessment & Plan Note (Signed)
-  secondary to Abraxane. She has diabetes also  -overall mild and stable, she mainly has numbness and tingling in her feet.

## 2023-01-31 ENCOUNTER — Encounter: Payer: Self-pay | Admitting: Hematology

## 2023-01-31 ENCOUNTER — Other Ambulatory Visit: Payer: Self-pay

## 2023-01-31 ENCOUNTER — Inpatient Hospital Stay: Payer: Medicare HMO | Attending: Physician Assistant

## 2023-01-31 ENCOUNTER — Inpatient Hospital Stay: Payer: Medicare HMO

## 2023-01-31 ENCOUNTER — Inpatient Hospital Stay: Payer: Medicare HMO | Admitting: Hematology

## 2023-01-31 VITALS — BP 142/96 | HR 78 | Temp 98.6°F | Resp 16 | Ht 62.0 in | Wt 142.4 lb

## 2023-01-31 DIAGNOSIS — Z79899 Other long term (current) drug therapy: Secondary | ICD-10-CM | POA: Insufficient documentation

## 2023-01-31 DIAGNOSIS — C250 Malignant neoplasm of head of pancreas: Secondary | ICD-10-CM

## 2023-01-31 DIAGNOSIS — T451X5A Adverse effect of antineoplastic and immunosuppressive drugs, initial encounter: Secondary | ICD-10-CM | POA: Insufficient documentation

## 2023-01-31 DIAGNOSIS — E119 Type 2 diabetes mellitus without complications: Secondary | ICD-10-CM | POA: Insufficient documentation

## 2023-01-31 DIAGNOSIS — Z86711 Personal history of pulmonary embolism: Secondary | ICD-10-CM | POA: Diagnosis not present

## 2023-01-31 DIAGNOSIS — M25512 Pain in left shoulder: Secondary | ICD-10-CM | POA: Insufficient documentation

## 2023-01-31 DIAGNOSIS — C787 Secondary malignant neoplasm of liver and intrahepatic bile duct: Secondary | ICD-10-CM | POA: Diagnosis present

## 2023-01-31 DIAGNOSIS — Z803 Family history of malignant neoplasm of breast: Secondary | ICD-10-CM | POA: Diagnosis not present

## 2023-01-31 DIAGNOSIS — Z5111 Encounter for antineoplastic chemotherapy: Secondary | ICD-10-CM | POA: Diagnosis present

## 2023-01-31 DIAGNOSIS — G620 Drug-induced polyneuropathy: Secondary | ICD-10-CM | POA: Insufficient documentation

## 2023-01-31 DIAGNOSIS — Z7952 Long term (current) use of systemic steroids: Secondary | ICD-10-CM | POA: Insufficient documentation

## 2023-01-31 DIAGNOSIS — Z794 Long term (current) use of insulin: Secondary | ICD-10-CM | POA: Diagnosis not present

## 2023-01-31 DIAGNOSIS — I2693 Single subsegmental pulmonary embolism without acute cor pulmonale: Secondary | ICD-10-CM | POA: Diagnosis not present

## 2023-01-31 DIAGNOSIS — I10 Essential (primary) hypertension: Secondary | ICD-10-CM | POA: Insufficient documentation

## 2023-01-31 DIAGNOSIS — Z7901 Long term (current) use of anticoagulants: Secondary | ICD-10-CM | POA: Diagnosis not present

## 2023-01-31 DIAGNOSIS — Z800 Family history of malignant neoplasm of digestive organs: Secondary | ICD-10-CM | POA: Insufficient documentation

## 2023-01-31 DIAGNOSIS — Z8042 Family history of malignant neoplasm of prostate: Secondary | ICD-10-CM | POA: Insufficient documentation

## 2023-01-31 DIAGNOSIS — Z95828 Presence of other vascular implants and grafts: Secondary | ICD-10-CM

## 2023-01-31 LAB — CBC WITH DIFFERENTIAL (CANCER CENTER ONLY)
Abs Immature Granulocytes: 0.01 10*3/uL (ref 0.00–0.07)
Basophils Absolute: 0 10*3/uL (ref 0.0–0.1)
Basophils Relative: 1 %
Eosinophils Absolute: 0.1 10*3/uL (ref 0.0–0.5)
Eosinophils Relative: 1 %
HCT: 30.7 % — ABNORMAL LOW (ref 36.0–46.0)
Hemoglobin: 10.2 g/dL — ABNORMAL LOW (ref 12.0–15.0)
Immature Granulocytes: 0 %
Lymphocytes Relative: 30 %
Lymphs Abs: 1.7 10*3/uL (ref 0.7–4.0)
MCH: 30.8 pg (ref 26.0–34.0)
MCHC: 33.2 g/dL (ref 30.0–36.0)
MCV: 92.7 fL (ref 80.0–100.0)
Monocytes Absolute: 0.4 10*3/uL (ref 0.1–1.0)
Monocytes Relative: 7 %
Neutro Abs: 3.6 10*3/uL (ref 1.7–7.7)
Neutrophils Relative %: 61 %
Platelet Count: 125 10*3/uL — ABNORMAL LOW (ref 150–400)
RBC: 3.31 MIL/uL — ABNORMAL LOW (ref 3.87–5.11)
RDW: 16.4 % — ABNORMAL HIGH (ref 11.5–15.5)
WBC Count: 5.7 10*3/uL (ref 4.0–10.5)
nRBC: 0 % (ref 0.0–0.2)

## 2023-01-31 LAB — CMP (CANCER CENTER ONLY)
ALT: 18 U/L (ref 0–44)
AST: 26 U/L (ref 15–41)
Albumin: 3.7 g/dL (ref 3.5–5.0)
Alkaline Phosphatase: 258 U/L — ABNORMAL HIGH (ref 38–126)
Anion gap: 6 (ref 5–15)
BUN: 13 mg/dL (ref 8–23)
CO2: 25 mmol/L (ref 22–32)
Calcium: 9 mg/dL (ref 8.9–10.3)
Chloride: 108 mmol/L (ref 98–111)
Creatinine: 0.56 mg/dL (ref 0.44–1.00)
GFR, Estimated: >60 mL/min (ref >60)
Glucose, Bld: 193 mg/dL — ABNORMAL HIGH (ref 70–99)
Potassium: 3.7 mmol/L (ref 3.5–5.1)
Sodium: 139 mmol/L (ref 135–145)
Total Bilirubin: 0.5 mg/dL (ref 0.3–1.2)
Total Protein: 6.5 g/dL (ref 6.5–8.1)

## 2023-01-31 MED ORDER — SODIUM CHLORIDE 0.9 % IV SOLN
2400.0000 mg/m2 | INTRAVENOUS | Status: DC
  Administered 2023-01-31: 4050 mg via INTRAVENOUS
  Filled 2023-01-31: qty 81

## 2023-01-31 MED ORDER — SODIUM CHLORIDE 0.9% FLUSH
10.0000 mL | Freq: Once | INTRAVENOUS | Status: AC
  Administered 2023-01-31: 10 mL

## 2023-01-31 MED ORDER — AMLODIPINE BESYLATE 5 MG PO TABS: 5.0000 mg | ORAL_TABLET | Freq: Every day | ORAL | 1 refills | Status: DC

## 2023-01-31 MED ORDER — DEXTROSE 5 % IV SOLN: Freq: Once | INTRAVENOUS | Status: AC

## 2023-01-31 MED ORDER — PALONOSETRON HCL INJECTION 0.25 MG/5ML
0.2500 mg | Freq: Once | INTRAVENOUS | Status: AC
  Administered 2023-01-31: 0.25 mg via INTRAVENOUS
  Filled 2023-01-31: qty 5

## 2023-01-31 MED ORDER — SODIUM CHLORIDE 0.9 % IV SOLN
10.0000 mg | Freq: Once | INTRAVENOUS | Status: AC
  Administered 2023-01-31: 10 mg via INTRAVENOUS
  Filled 2023-01-31: qty 10

## 2023-01-31 MED ORDER — LEUCOVORIN CALCIUM INJECTION 350 MG
400.0000 mg/m2 | Freq: Once | INTRAVENOUS | Status: AC
  Administered 2023-01-31: 672 mg via INTRAVENOUS
  Filled 2023-01-31: qty 33.6

## 2023-01-31 MED ORDER — OXALIPLATIN CHEMO INJECTION 100 MG/20ML
70.0000 mg/m2 | Freq: Once | INTRAVENOUS | Status: AC
  Administered 2023-01-31: 120 mg via INTRAVENOUS
  Filled 2023-01-31: qty 4

## 2023-01-31 NOTE — Patient Instructions (Addendum)
[facility]  Discharge Instructions: Thank you for choosing [facility] to provide your oncology and hematology care.   If you have a lab appointment with the [facility], please go directly to the [facility] and check in at the registration area.   Wear comfortable clothing and clothing appropriate for easy access to any Portacath or PICC line.   We strive to give you quality time with your provider. You may need to reschedule your appointment if you arrive late (15 or more minutes).  Arriving late affects you and other patients whose appointments are after yours.  Also, if you miss three or more appointments without notifying the office, you may be dismissed from the clinic at the provider's discretion.      For prescription refill requests, have your pharmacy contact our office and allow 72 hours for refills to be completed.    Today you received the following chemotherapy and/or immunotherapy agents: Oxaliplatin and Fluorouracil        To help prevent nausea and vomiting after your treatment, we encourage you to take your nausea medication as directed.  BELOW ARE SYMPTOMS THAT SHOULD BE REPORTED IMMEDIATELY: *FEVER GREATER THAN 100.4 F (38 C) OR HIGHER *CHILLS OR SWEATING *NAUSEA AND VOMITING THAT IS NOT CONTROLLED WITH YOUR NAUSEA MEDICATION *UNUSUAL SHORTNESS OF BREATH *UNUSUAL BRUISING OR BLEEDING *URINARY PROBLEMS (pain or burning when urinating, or frequent urination) *BOWEL PROBLEMS (unusual diarrhea, constipation, pain near the anus) TENDERNESS IN MOUTH AND THROAT WITH OR WITHOUT PRESENCE OF ULCERS (sore throat, sores in mouth, or a toothache) UNUSUAL RASH, SWELLING OR PAIN  UNUSUAL VAGINAL DISCHARGE OR ITCHING   Items with * indicate a potential emergency and should be followed up as soon as possible or go to the Emergency Department if any problems should occur.  Please show the CHEMOTHERAPY ALERT CARD or  IMMUNOTHERAPY ALERT CARD at check-in to the Emergency Department and triage nurse.  Should you have questions after your visit or need to cancel or reschedule your appointment, please contact [facility]  Dept: [phone]  and follow the prompts.  Office hours are 8:00 a.m. to 4:30 p.m. Monday - Friday. Please note that voicemails left after 4:00 p.m. may not be returned until the following business day.  We are closed weekends and major holidays. You have access to a nurse at all times for urgent questions. Please call the main number to the clinic Dept: [phone] and follow the prompts.   For any non-urgent questions, you may also contact your provider using MyChart. We now offer e-Visits for anyone 39 and older to request care online for non-urgent symptoms. For details visit [email or web address].   Also download the MyChart app! Go to the app store, search "MyChart", open the app, select [facility], and log in with your MyChart username and password.

## 2023-01-31 NOTE — Progress Notes (Signed)
[place]   [phone] [phone]   Clinic Follow up Note   Patient Care Team: [name], MD as PCP - General (Internal Medicine) [name], MD as PCP - Cardiology (Cardiology) Truitt Merle, MD as Consulting Physician (Oncology)  Date of Service:  01/31/2023  CHIEF COMPLAINT: f/u of pancreatic cancer   CURRENT THERAPY:  start of third line FOLFOX   ASSESSMENT:  Miranda Francis is a 77 y.o. female with   Pancreatic cancer ([place]) stage IV c(T2, N0, M1) with liver metastasis, MMR normal   -Diagnosed in 01/2022. presented to PCP with jaundice for one month. Also complained of epigastric pain, poor appetite, weight loss, and light-colored stool.  -she began neoadjuvant gemcitabine alone on 02/16/22, abraxane was added with C2 (03/08/22).   -liver biopsy confirmed metastatic cancer  -Due to cancer progression, chemo changed to second line FOLFIRI on November 2023 -She unfortunately did not respond well to chemo, restaging scan from February 2024 showed disease progression in liver again -She was changed to third line chemotherapy FOLFOX on 01/03/23 -I reviewed her NexGen or sequencing TEMPUS result, unfortunately there is no targetable therapy. -Given her very limited treatment options down the road, I encouraged her to think about clinical trial options.  Discussed clinical trial means experimental, not FDA approved treatment, and she needs to go to other cancer center for trial options, such as [facility], [facility] or [facility].  She will think about it.   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 She has diabetes also  -overall mild and stable, she mainly has numbness and tingling in her feet.    Pulmonary embolism ([place]) -Staging CT scan from December 25 2022 showed pulmonary embolism  in the left main pulmonary artery extending into the left upper and lower lobe branches. No right-sided pulmonary emboli. No findings for  right heart strain.  -Patient was evaluated in the emergency room, no hypoxia or other concerning signs, patient was discharged home with Eliquis loading dose for a week. -She is tolerating well, will continue indefinitely      PLAN: -Discuss  TEMPUS test result and start thinking about clinical trial option -lab reviewed -Proceed with C2 FOLFOX today at same dose  -lab/flush,f.u and FOLFOX 02/14/2023     SUMMARY OF ONCOLOGIC HISTORY: Oncology History Overview Note   Cancer Staging  Pancreatic cancer [facility]) Staging form: Exocrine Pancreas, AJCC 8th Edition - Clinical stage from 01/18/2022: Stage IB (cT2, cN0, cM0) - Signed by Truitt Merle, MD on 01/28/2022    Pancreatic cancer ([place])  01/16/2022 Imaging   CLINICAL DATA:  Jaundice   EXAM: CT ABDOMEN AND PELVIS WITH CONTRAST  IMPRESSION: 1. Marked intra and extrahepatic biliary dilatation with marked dilatation of pancreatic duct and distended gallbladder. There is significant atrophy of the body and tail of pancreas. 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 2. Diverticular disease of the colon without acute wall thickening 3. Mild endometrial thickening up to 9 mm, recommend nonemergent pelvic ultrasound for further evaluation   01/17/2022 Imaging   EXAM: MRI ABDOMEN WITHOUT AND WITH CONTRAST (INCLUDING MRCP)  IMPRESSION: 1. Obstructing mass within the pancreatic head with an intraluminal component demonstrating low level enhancement. 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 2.  This mass causes significant extrinsic narrowing at the portal/superior mesenteric vein confluence as well as marked intra and extrahepatic biliary  dilatation. 3. No distant metastases identified. 4. Endoscopy for endoscopic ultrasound, tissue sampling and biliary decompression recommended.   01/18/2022 Cancer Staging   Staging form: Exocrine Pancreas, AJCC 8th Edition - Clinical stage from 01/18/2022: Stage IB (cT2, cN0, cM0) - Signed by Truitt Merle, MD on 01/28/2022 Stage prefix: Initial diagnosis Total positive nodes: 0   01/18/2022 Initial Biopsy   FINAL MICROSCOPIC DIAGNOSIS:  A. PANCREAS, HEAD, FINE NEEDLE ASPIRATION:  - Malignant cells present consistent with adenocarcinoma   01/24/2022 Imaging   EXAM: CT CHEST WITHOUT CONTRAST  IMPRESSION: 1. No evidence of metastatic disease in the chest. 2. Atelectasis or consolidation in the right lower lung. 3. Healing right rib fractures. 4. Mass in the head of the pancreas. See previous report of CT abdomen and pelvis 01/16/2022. Interval placement of the biliary stent.   01/28/2022 Initial Diagnosis   Pancreatic cancer ([place])   02/16/2022 - 06/28/2022 Chemotherapy   Patient is on Treatment Plan : PANCREATIC Abraxane / Gemcitabine D1,8 q21d     02/16/2022 Genetic Testing   Negative genetic testing on the CancerNext-Expanded+RNAinsight panel.  The report date is February 16, 2022.  The CancerNext-Expanded gene panel offered by [facility] and includes sequencing and rearrangement analysis for the following 77 genes: AIP, ALK, APC*, ATM*, AXIN2, BAP1, BARD1, BLM, BMPR1A, BRCA1*, BRCA2*, BRIP1*, CDC73, CDH1*, CDK4, CDKN1B, CDKN2A, CHEK2*, CTNNA1, DICER1, FANCC, FH, FLCN, GALNT12, KIF1B, LZTR1, MAX, MEN1, MET, MLH1*, MSH2*, MSH3, MSH6*, MUTYH*, NBN, NF1*, NF2, NTHL1, PALB2*, PHOX2B, PMS2*, POT1, PRKAR1A, PTCH1, PTEN*, RAD51C*, RAD51D*, RB1, RECQL, RET, SDHA, SDHAF2, SDHB, SDHC, SDHD, SMAD4, SMARCA4, SMARCB1, SMARCE1, STK11, SUFU, TMEM127, TP53*, TSC1, TSC2, VHL and XRCC2 (sequencing and deletion/duplication); EGFR, EGLN1, HOXB13, KIT, MITF, PDGFRA, POLD1, and POLE (sequencing only); EPCAM and GREM1  (deletion/duplication only). DNA and RNA analyses performed for * genes.    06/14/2022 - 09/19/2022 Chemotherapy   Patient is on Treatment Plan : PANCREATIC Abraxane D1,8,15 + Gemcitabine D1,8,15 q28d     10/11/2022 - 12/08/2022 Chemotherapy   Patient is on Treatment Plan : PANCREAS Liposomal Irinotecan + Leucovorin + 5-FU IVCI q14d     12/25/2022 Imaging    IMPRESSION: 1. Left-sided pulmonary emboli. No findings for right heart strain. 2. Stable small left upper lobe pulmonary nodules. No new or progressive findings in the chest. 3. Overall stable appearing infiltrating pancreatic neoplasm. 4. New and interval enlargement of hepatic metastatic disease. 5. Biliary stent in place with associated pneumobilia and gas in the gallbladder. Mild to moderate biliary dilatation. 6. 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 Extensive portal venous collateral vessels.     01/03/2023 -  Chemotherapy   Patient is on Treatment Plan : PANCREAS FOLFOX q14d        INTERVAL HISTORY:  Miranda Francis is here for a follow up of pancreatic cancer  She was last seen by  NP Lacie on 01/03/2023 She presents to the clinic  Accompanied by husband. Pt state she feels good since she has been off treatment for a while. Pt state she has left shoulder pain when she raised it up.Pt state she has been eating. Pt state that after the last treatment she was sick for 2 days.   All other systems were reviewed with the patient and are negative.  MEDICAL HISTORY:  Past Medical History:  Diagnosis Date   Blood transfusion    Diabetes  mellitus    Family history of breast cancer    Family history of colon cancer    Family history of prostate cancer    Heart murmur    Hypertension    pancreatic ca 12/2021    SURGICAL HISTORY: Past Surgical History:  Procedure Laterality Date   BILIARY STENT PLACEMENT  01/18/2022   Procedure: BILIARY STENT PLACEMENT;  Surgeon: Carol Ada, MD;  Location:  [place];  Service: Gastroenterology;;   DILATION AND CURETTAGE OF UTERUS     ERCP N/A 01/18/2022   Procedure: ENDOSCOPIC RETROGRADE CHOLANGIOPANCREATOGRAPHY (ERCP);  Surgeon: Carol Ada, MD;  Location: [place];  Service: Gastroenterology;  Laterality: N/A;   ESOPHAGOGASTRODUODENOSCOPY N/A 01/18/2022   Procedure: ESOPHAGOGASTRODUODENOSCOPY (EGD);  Surgeon: Carol Ada, MD;  Location: [place];  Service: Gastroenterology;  Laterality: N/A;   EUS  01/18/2022   Procedure: UPPER ENDOSCOPIC ULTRASOUND (EUS) LINEAR;  Surgeon: Carol Ada, MD;  Location: [facility] ENDOSCOPY;  Service: Gastroenterology;;   FINE NEEDLE ASPIRATION  01/18/2022   Procedure: FINE NEEDLE ASPIRATION (FNA) LINEAR;  Surgeon: Carol Ada, MD;  Location: [facility] ENDOSCOPY;  Service: Gastroenterology;;   IR IMAGING GUIDED PORT INSERTION  02/15/2022   NO PAST SURGERIES     SPHINCTEROTOMY  01/18/2022   Procedure: Joan Mayans;  Surgeon: Carol Ada, MD;  Location: [place];  Service: Gastroenterology;;    I have reviewed the social history and family history with the patient and they are unchanged from previous note.  ALLERGIES:  is allergic to irinotecan liposome.  MEDICATIONS:  Current Outpatient Medications  Medication Sig Dispense Refill   acetaminophen (TYLENOL) 500 MG tablet Take 1,000 mg by mouth every 6 (six) hours as needed for moderate pain or headache.     amLODipine (NORVASC) 5 MG tablet Take 1 tablet (5 mg total) by mouth daily. 30 tablet 1   apixaban (ELIQUIS) 5 MG TABS tablet Take 1 tablet (5 mg total) by mouth 2 (two) times daily. 60 tablet 2   APIXABAN (ELIQUIS) VTE STARTER PACK (10MG  AND 5MG ) Take as directed on package: start with two-5mg  tablets twice daily for 7 days. On day 8, switch to one-5mg  tablet twice daily. 74 each 0   Cholecalciferol (QC VITAMIN D3) 50 MCG (2000 UT) TABS Take 2,000 Units by mouth daily.     Continuous Blood Gluc Sensor (FREESTYLE LIBRE 2 SENSOR) MISC USE 1 SENSOR AND CHANGE  EVERY 14 DAYS AS DIRECTED.     dexamethasone (DECADRON) 4 MG tablet Take 2 tablets (8 mg total) by mouth daily. Start the day after chemotherapy for 2 days. Take with food. 30 tablet 1   diphenoxylate-atropine (LOMOTIL) 2.5-0.025 MG tablet Take 1 tablet by mouth 4 (four) times daily as needed for diarrhea or loose stools. 30 tablet 0   gabapentin (NEURONTIN) 100 MG capsule Take 2-3 capsules (200-300 mg total) by mouth at bedtime. 90 capsule 0   KLOR-CON M20 20 MEQ tablet Take 20 mEq by mouth 2 (two) times daily.     lidocaine-prilocaine (EMLA) cream Apply 1 application topically as needed. 30 g 1   lidocaine-prilocaine (EMLA) cream Apply to affected area once 30 g 3   losartan (COZAAR) 100 MG tablet Take 100 mg by mouth daily.     mirtazapine (REMERON) 7.5 MG tablet Take 1 tablet (7.5 mg total) by mouth at bedtime. 30 tablet 0   NOVOLOG FLEXPEN 100 UNIT/ML FlexPen Inject 15 Units into the skin 2 (two) times daily.     ondansetron (ZOFRAN) 8 MG tablet Take 1 tablet (  8 mg total) by mouth every 8 (eight) hours as needed for nausea or vomiting. Start on the third day after chemotherapy. 30 tablet 1   oxyCODONE (OXY IR/ROXICODONE) 5 MG immediate release tablet Take 1 tablet (5 mg total) by mouth every 3 (three) hours as needed for severe pain. 20 tablet 0   polyethylene glycol (MIRALAX / GLYCOLAX) 17 g packet Take 17 g by mouth daily. 14 each 0   prochlorperazine (COMPAZINE) 10 MG tablet Take 1 tablet (10 mg total) by mouth every 6 (six) hours as needed for nausea or vomiting. 30 tablet 1   zolpidem (AMBIEN) 10 MG tablet Take 10 mg by mouth at bedtime as needed.     No current facility-administered medications for this visit.    PHYSICAL EXAMINATION: ECOG PERFORMANCE STATUS: 1 - Symptomatic but completely ambulatory  Vitals:   01/31/23 0947  BP: (!) 142/96  Pulse: 78  Resp: 16  Temp: 98.6 F (37 C)  SpO2: 99%   Wt Readings from Last 3 Encounters:  01/31/23 142 lb 6.4 oz (64.6 kg)   01/03/23 142 lb 12.8 oz (64.8 kg)  12/27/22 142 lb 14.4 oz (64.8 kg)     GENERAL:alert, no distress and comfortable SKIN: skin color normal, no rashes or significant lesions EYES: normal, Conjunctiva are pink and non-injected, sclera clear  NEURO: alert & oriented x 3 with fluent speech   LABORATORY DATA:  I have reviewed the data as listed    Latest Ref Rng & Units 01/31/2023    9:16 AM 01/03/2023    8:42 AM 12/25/2022   12:05 PM  CBC  WBC 4.0 - 10.5 K/uL 5.7  5.6  4.8   Hemoglobin 12.0 - 15.0 g/dL 10.2  10.1  10.1   Hematocrit 36.0 - 46.0 % 30.7  30.0  32.1   Platelets 150 - 400 K/uL 125  159  182         Latest Ref Rng & Units 01/31/2023    9:16 AM 01/03/2023    8:42 AM 12/25/2022   12:05 PM  CMP  Glucose 70 - 99 mg/dL 193  199  71   BUN 8 - 23 mg/dL 13  10  12    Creatinine 0.44 - 1.00 mg/dL 0.56  0.50  0.59   Sodium 135 - 145 mmol/L 139  138  140   Potassium 3.5 - 5.1 mmol/L 3.7  3.8  3.5   Chloride 98 - 111 mmol/L 108  108  109   CO2 22 - 32 mmol/L 25  24  24    Calcium 8.9 - 10.3 mg/dL 9.0  8.6  8.8   Total Protein 6.5 - 8.1 g/dL 6.5  6.4    Total Bilirubin 0.3 - 1.2 mg/dL 0.5  0.4    Alkaline Phos 38 - 126 U/L 258  290    AST 15 - 41 U/L 26  29    ALT 0 - 44 U/L 18  21        RADIOGRAPHIC STUDIES: I have personally reviewed the radiological images as listed and agreed with the findings in the report. No results found.    Orders Placed This Encounter  Procedures   CBC with Differential ([place] Only)    Standing Status:   Future    Standing Expiration Date:   02/14/2024   CMP ([place] only)    Standing Status:   Future    Standing Expiration Date:   02/14/2024   CBC with Differential (Cancer  Center Only)    Standing Status:   Future    Standing Expiration Date:   02/28/2024   CMP ([place] only)    Standing Status:   Future    Standing Expiration Date:   02/28/2024   Cancer antigen 19-9    Standing Status:   Standing    Number of  Occurrences:   20    Standing Expiration Date:   01/31/2024   All questions were answered. The patient knows to call the clinic with any problems, questions or concerns. No barriers to learning was detected. The total time spent in the appointment was 40 minutes.     Truitt Merle, MD 01/31/2023   Felicity Coyer, CMA, am acting as scribe for Truitt Merle, MD.   I have reviewed the above documentation for accuracy and completeness, and I agree with the above.

## 2023-02-02 ENCOUNTER — Inpatient Hospital Stay: Payer: Medicare HMO

## 2023-02-02 VITALS — BP 122/70 | HR 67 | Temp 97.8°F | Resp 16

## 2023-02-02 DIAGNOSIS — Z5111 Encounter for antineoplastic chemotherapy: Secondary | ICD-10-CM | POA: Diagnosis not present

## 2023-02-02 DIAGNOSIS — C250 Malignant neoplasm of head of pancreas: Secondary | ICD-10-CM

## 2023-02-02 MED ORDER — HEPARIN SOD (PORK) LOCK FLUSH 100 UNIT/ML IV SOLN
500.0000 [IU] | Freq: Once | INTRAVENOUS | Status: AC | PRN
  Administered 2023-02-02: 500 [IU]

## 2023-02-02 MED ORDER — SODIUM CHLORIDE 0.9% FLUSH
10.0000 mL | INTRAVENOUS | Status: DC | PRN
  Administered 2023-02-02: 10 mL

## 2023-02-02 NOTE — Patient Instructions (Signed)

## 2023-02-05 LAB — CANCER ANTIGEN 19-9: CA 19-9: 99022 U/mL — ABNORMAL HIGH (ref 0–35)

## 2023-02-12 ENCOUNTER — Other Ambulatory Visit: Payer: Self-pay

## 2023-02-13 MED FILL — Dexamethasone Sodium Phosphate Inj 100 MG/10ML: INTRAMUSCULAR | Qty: 1 | Status: AC

## 2023-02-13 NOTE — Assessment & Plan Note (Signed)
stage IV c(T2, N0, M1) with liver metastasis, MMR normal   -Diagnosed in 01/2022. presented to PCP with jaundice for one month. Also complained of epigastric pain, poor appetite, weight loss, and light-colored stool.  -she began neoadjuvant gemcitabine alone on 02/16/22, abraxane was added with C2 (03/08/22).   -liver biopsy confirmed metastatic cancer  -Due to cancer progression, chemo changed to second line FOLFIRI on November 2023 -She unfortunately did not respond well to chemo, restaging scan from February 2024 showed disease progression in liver again -She was changed to third line chemotherapy FOLFOX on 01/03/23 -I reviewed her NexGen or sequencing TEMPUS result, unfortunately there is no targetable therapy. 

## 2023-02-13 NOTE — Progress Notes (Unsigned)
[place]   [phone] Fax:([phone]   Clinic Follow up Note   Patient Care Team: [name], MD as PCP - General (Internal Medicine) [name], MD as PCP - Cardiology (Cardiology) Truitt Merle, MD as Consulting Physician (Oncology)  Date of Service:  02/14/2023  CHIEF COMPLAINT: f/u of pancreatic cancer   CURRENT THERAPY:  start of third line FOLFOX    ASSESSMENT:  Miranda Francis is a 77 y.o. female with   Pancreatic cancer ([place]) stage IV c(T2, N0, M1) with liver metastasis, MMR normal   -Diagnosed in 01/2022. presented to PCP with jaundice for one month. Also complained of epigastric pain, poor appetite, weight loss, and light-colored stool.  -she began neoadjuvant gemcitabine alone on 02/16/22, abraxane was added with C2 (03/08/22).   -liver biopsy confirmed metastatic cancer  -Due to cancer progression, chemo changed to second line FOLFIRI on November 2023 -She unfortunately did not respond well to chemo, restaging scan from February 2024 showed disease progression in liver again -She was changed to third line chemotherapy FOLFOX on 01/03/23 -I reviewed her NexGen or sequencing TEMPUS result, unfortunately there is no targetable therapy.  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 She has diabetes also  -overall mild and stable, she mainly has numbness and tingling in her feet.    Pulmonary embolism ([place]) -Staging CT scan from December 25 2022 showed pulmonary embolism  in the left main pulmonary artery extending into the left upper and lower lobe branches. No right-sided pulmonary emboli. No findings for right heart strain.  -Patient was evaluated in the emergency room, no hypoxia or other concerning signs, patient was discharged home with Eliquis loading dose for a week. -She is tolerating well, will continue indefinitely       PLAN: -lab reviewed -Proceed with C3 FOLFOX today at same dose - I  refill Gabapentin - I refill lidocaine cream. - order restaging scan next visit -la/flush and f/u and FOLFOX 4/18  SUMMARY OF ONCOLOGIC HISTORY: Oncology History Overview Note   Cancer Staging  Pancreatic cancer [facility]) Staging form: Exocrine Pancreas, AJCC 8th Edition - Clinical stage from 01/18/2022: Stage IB (cT2, cN0, cM0) - Signed by Truitt Merle, MD on 01/28/2022    Pancreatic cancer  01/16/2022 Imaging   CLINICAL DATA:  Jaundice   EXAM: CT ABDOMEN AND PELVIS WITH CONTRAST  IMPRESSION: 1. Marked intra and extrahepatic biliary dilatation with marked dilatation of pancreatic duct and distended gallbladder. There is significant atrophy of the body and tail of pancreas. 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 2. Diverticular disease of the colon without acute wall thickening 3. Mild endometrial thickening up to 9 mm, recommend nonemergent pelvic ultrasound for further evaluation   01/17/2022 Imaging   EXAM: MRI ABDOMEN WITHOUT AND WITH CONTRAST (INCLUDING MRCP)  IMPRESSION: 1. Obstructing mass within the pancreatic head with an intraluminal component demonstrating low level enhancement. 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 2. This mass causes significant extrinsic narrowing at the portal/superior mesenteric vein confluence as well as marked intra and extrahepatic biliary dilatation. 3. No distant metastases identified. 4. Endoscopy for endoscopic ultrasound, tissue sampling and biliary decompression recommended.   01/18/2022 Cancer Staging   Staging form: Exocrine Pancreas, AJCC 8th Edition -  Clinical stage from 01/18/2022: Stage IB (cT2, cN0, cM0) - Signed by Truitt Merle,  MD on 01/28/2022 Stage prefix: Initial diagnosis Total positive nodes: 0   01/18/2022 Initial Biopsy   FINAL MICROSCOPIC DIAGNOSIS:  A. PANCREAS, HEAD, FINE NEEDLE ASPIRATION:  - Malignant cells present consistent with adenocarcinoma   01/24/2022 Imaging   EXAM: CT CHEST WITHOUT CONTRAST  IMPRESSION: 1. No evidence of metastatic disease in the chest. 2. Atelectasis or consolidation in the right lower lung. 3. Healing right rib fractures. 4. Mass in the head of the pancreas. See previous report of CT abdomen and pelvis 01/16/2022. Interval placement of the biliary stent.   01/28/2022 Initial Diagnosis   Pancreatic cancer ([place])   02/16/2022 - 06/28/2022 Chemotherapy   Patient is on Treatment Plan : PANCREATIC Abraxane / Gemcitabine D1,8 q21d     02/16/2022 Genetic Testing   Negative genetic testing on the CancerNext-Expanded+RNAinsight panel.  The report date is February 16, 2022.  The CancerNext-Expanded gene panel offered by [facility] and includes sequencing and rearrangement analysis for the following 77 genes: AIP, ALK, APC*, ATM*, AXIN2, BAP1, BARD1, BLM, BMPR1A, BRCA1*, BRCA2*, BRIP1*, CDC73, CDH1*, CDK4, CDKN1B, CDKN2A, CHEK2*, CTNNA1, DICER1, FANCC, FH, FLCN, GALNT12, KIF1B, LZTR1, MAX, MEN1, MET, MLH1*, MSH2*, MSH3, MSH6*, MUTYH*, NBN, NF1*, NF2, NTHL1, PALB2*, PHOX2B, PMS2*, POT1, PRKAR1A, PTCH1, PTEN*, RAD51C*, RAD51D*, RB1, RECQL, RET, SDHA, SDHAF2, SDHB, SDHC, SDHD, SMAD4, SMARCA4, SMARCB1, SMARCE1, STK11, SUFU, TMEM127, TP53*, TSC1, TSC2, VHL and XRCC2 (sequencing and deletion/duplication); EGFR, EGLN1, HOXB13, KIT, MITF, PDGFRA, POLD1, and POLE (sequencing only); EPCAM and GREM1 (deletion/duplication only). DNA and RNA analyses performed for * genes.    06/14/2022 - 09/19/2022 Chemotherapy   Patient is on Treatment Plan : PANCREATIC Abraxane D1,8,15 + Gemcitabine D1,8,15 q28d     10/11/2022 - 12/08/2022 Chemotherapy   Patient is on Treatment Plan : PANCREAS Liposomal Irinotecan +  Leucovorin + 5-FU IVCI q14d     12/25/2022 Imaging    IMPRESSION: 1. Left-sided pulmonary emboli. No findings for right heart strain. 2. Stable small left upper lobe pulmonary nodules. No new or progressive findings in the chest. 3. Overall stable appearing infiltrating pancreatic neoplasm. 4. New and interval enlargement of hepatic metastatic disease. 5. Biliary stent in place with associated pneumobilia and gas in the gallbladder. Mild to moderate biliary dilatation. 6. 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 Extensive portal venous collateral vessels.     01/03/2023 -  Chemotherapy   Patient is on Treatment Plan : PANCREAS FOLFOX q14d      Pathology Results      Pathology Results      Miscellaneous         INTERVAL HISTORY:  Miranda Francis is here for a follow up of pancreatic cancer  She was last seen by me on 01/31/2023 She presents to the clinic accompanied by husband. Pt state that she got sick after the pump d/c. Pt report of feeling cold sensitivity.     All other systems were reviewed with the patient and are negative.  MEDICAL HISTORY:  Past Medical History:  Diagnosis Date   Blood transfusion    Diabetes mellitus    Family history of breast cancer    Family history of colon cancer    Family history of prostate cancer    Heart murmur    Hypertension    pancreatic ca 12/2021    SURGICAL HISTORY: Past Surgical History:  Procedure Laterality Date   BILIARY STENT PLACEMENT  01/18/2022  Procedure: BILIARY STENT PLACEMENT;  Surgeon: Carol Ada, MD;  Location: [place];  Service: Gastroenterology;;   DILATION AND CURETTAGE OF UTERUS     ERCP N/A 01/18/2022   Procedure: ENDOSCOPIC RETROGRADE CHOLANGIOPANCREATOGRAPHY (ERCP);  Surgeon: Carol Ada, MD;  Location: [place];  Service: Gastroenterology;  Laterality: N/A;   ESOPHAGOGASTRODUODENOSCOPY N/A 01/18/2022   Procedure: ESOPHAGOGASTRODUODENOSCOPY (EGD);  Surgeon:  Carol Ada, MD;  Location: [place];  Service: Gastroenterology;  Laterality: N/A;   EUS  01/18/2022   Procedure: UPPER ENDOSCOPIC ULTRASOUND (EUS) LINEAR;  Surgeon: Carol Ada, MD;  Location[facility]COPY;  Service: Gastroenterology;;   FINE NEEDLE ASPIRATION  01/18/2022   Procedure: FINE NEEDLE ASPIRATION (FNA) LINEAR;  Surgeon: Carol Ada, MD;  [facility] ENDOSCOPY;  Service: Gastroenterology;;   IR IMAGING GUIDED PORT INSERTION  02/15/2022   NO PAST SURGERIES     SPHINCTEROTOMY  01/18/2022   Procedure: Joan Mayans;  Surgeon: Carol Ada, MD;  Location: [place];  Service: Gastroenterology;;    I have reviewed the social history and family history with the patient and they are unchanged from previous note.  ALLERGIES:  is allergic to irinotecan liposome.  MEDICATIONS:  Current Outpatient Medications  Medication Sig Dispense Refill   acetaminophen (TYLENOL) 500 MG tablet Take 1,000 mg by mouth every 6 (six) hours as needed for moderate pain or headache.     amLODipine (NORVASC) 5 MG tablet Take 1 tablet (5 mg total) by mouth daily. 30 tablet 1   apixaban (ELIQUIS) 5 MG TABS tablet Take 1 tablet (5 mg total) by mouth 2 (two) times daily. 60 tablet 2   APIXABAN (ELIQUIS) VTE STARTER PACK (10MG  AND 5MG ) Take as directed on package: start with two-5mg  tablets twice daily for 7 days. On day 8, switch to one-5mg  tablet twice daily. 74 each 0   Cholecalciferol (QC VITAMIN D3) 50 MCG (2000 UT) TABS Take 2,000 Units by mouth daily.     Continuous Blood Gluc Sensor (FREESTYLE LIBRE 2 SENSOR) MISC USE 1 SENSOR AND CHANGE EVERY 14 DAYS AS DIRECTED.     dexamethasone (DECADRON) 4 MG tablet Take 2 tablets (8 mg total) by mouth daily. Start the day after chemotherapy for 2 days. Take with food. 30 tablet 1   diphenoxylate-atropine (LOMOTIL) 2.5-0.025 MG tablet Take 1 tablet by mouth 4 (four) times daily as needed for diarrhea or loose stools. 30 tablet 0   gabapentin (NEURONTIN) 100 MG  capsule Take 1-2 capsules (100-200 mg total) by mouth 2 (two) times daily. 90 capsule 1   KLOR-CON M20 20 MEQ tablet Take 20 mEq by mouth 2 (two) times daily.     lidocaine-prilocaine (EMLA) cream Apply to affected area once 30 g 3   lidocaine-prilocaine (EMLA) cream Apply 1 application topically as needed. 30 g 1   losartan (COZAAR) 100 MG tablet Take 100 mg by mouth daily.     mirtazapine (REMERON) 7.5 MG tablet Take 1 tablet (7.5 mg total) by mouth at bedtime. 30 tablet 0   NOVOLOG FLEXPEN 100 UNIT/ML FlexPen Inject 15 Units into the skin 2 (two) times daily.     ondansetron (ZOFRAN) 8 MG tablet Take 1 tablet (8 mg total) by mouth every 8 (eight) hours as needed for nausea or vomiting. Start on the third day after chemotherapy. 30 tablet 1   oxyCODONE (OXY IR/ROXICODONE) 5 MG immediate release tablet Take 1 tablet (5 mg total) by mouth every 3 (three) hours as needed for severe pain. 20 tablet 0   polyethylene glycol (  MIRALAX / GLYCOLAX) 17 g packet Take 17 g by mouth daily. 14 each 0   prochlorperazine (COMPAZINE) 10 MG tablet Take 1 tablet (10 mg total) by mouth every 6 (six) hours as needed for nausea or vomiting. 30 tablet 1   zolpidem (AMBIEN) 10 MG tablet Take 10 mg by mouth at bedtime as needed.     No current facility-administered medications for this visit.   Facility-Administered Medications Ordered in Other Visits  Medication Dose Route Frequency Provider Last Rate Last Admin   dexamethasone (DECADRON) 10 mg in sodium chloride 0.9 % 50 mL IVPB  10 mg Intravenous Once Truitt Merle, MD       dextrose 5 % solution   Intravenous Once Truitt Merle, MD       fluorouracil (ADRUCIL) 4,050 mg in sodium chloride 0.9 % 69 mL chemo infusion  2,400 mg/m2 (Treatment Plan Recorded) Intravenous 1 day or 1 dose Truitt Merle, MD       heparin lock flush 100 unit/mL  500 Units Intracatheter Once PRN Truitt Merle, MD       leucovorin 672 mg in dextrose 5 % 250 mL infusion  400 mg/m2 (Treatment Plan Recorded)  Intravenous Once Truitt Merle, MD       oxaliplatin (ELOXATIN) 120 mg in dextrose 5 % 500 mL chemo infusion  70 mg/m2 (Treatment Plan Recorded) Intravenous Once Truitt Merle, MD       palonosetron (ALOXI) injection 0.25 mg  0.25 mg Intravenous Once Truitt Merle, MD       sodium chloride flush (NS) 0.9 % injection 10 mL  10 mL Intracatheter PRN Truitt Merle, MD        PHYSICAL EXAMINATION: ECOG PERFORMANCE STATUS: 1 - Symptomatic but completely ambulatory  Vitals:   02/14/23 0944  BP: 138/74  Pulse: 70  Resp: 18  Temp: (!) 97.3 F (36.3 C)  SpO2: 98%   Wt Readings from Last 3 Encounters:  02/14/23 143 lb 14.4 oz (65.3 kg)  01/31/23 142 lb 6.4 oz (64.6 kg)  01/03/23 142 lb 12.8 oz (64.8 kg)     GENERAL:alert, no distress and comfortable SKIN: skin color normal, no rashes or significant lesions EYES: normal, Conjunctiva are pink and non-injected, sclera clear  NEURO: alert & oriented x 3 with fluent speech   LABORATORY DATA:  I have reviewed the data as listed    Latest Ref Rng & Units 02/14/2023    8:35 AM 01/31/2023    9:16 AM 01/03/2023    8:42 AM  CBC  WBC 4.0 - 10.5 K/uL 5.3  5.7  5.6   Hemoglobin 12.0 - 15.0 g/dL 10.0  10.2  10.1   Hematocrit 36.0 - 46.0 % 28.7  30.7  30.0   Platelets 150 - 400 K/uL 110  125  159         Latest Ref Rng & Units 02/14/2023    8:35 AM 01/31/2023    9:16 AM 01/03/2023    8:42 AM  CMP  Glucose 70 - 99 mg/dL 157  193  199   BUN 8 - 23 mg/dL 12  13  10    Creatinine 0.44 - 1.00 mg/dL 0.57  0.56  0.50   Sodium 135 - 145 mmol/L 138  139  138   Potassium 3.5 - 5.1 mmol/L 4.0  3.7  3.8   Chloride 98 - 111 mmol/L 107  108  108   CO2 22 - 32 mmol/L 24  25  24    Calcium 8.9 -  10.3 mg/dL 9.3  9.0  8.6   Total Protein 6.5 - 8.1 g/dL 6.5  6.5  6.4   Total Bilirubin 0.3 - 1.2 mg/dL 0.5  0.5  0.4   Alkaline Phos 38 - 126 U/L 230  258  290   AST 15 - 41 U/L 20  26  29    ALT 0 - 44 U/L 16  18  21        RADIOGRAPHIC STUDIES: I have personally reviewed  the radiological images as listed and agreed with the findings in the report. No results found.    No orders of the defined types were placed in this encounter.  All questions were answered. The patient knows to call the clinic with any problems, questions or concerns. No barriers to learning was detected. The total time spent in the appointment was 25 minutes.     Truitt Merle, MD 02/14/2023   Felicity Coyer, CMA, am acting as scribe for Truitt Merle, MD.   I have reviewed the above documentation for accuracy and completeness, and I agree with the above.

## 2023-02-13 NOTE — Assessment & Plan Note (Signed)
-  Staging CT scan from December 25 2022 showed pulmonary embolism  in the left main pulmonary artery extending into the left upper and lower lobe branches. No right-sided pulmonary emboli. No findings for right heart strain.  -Patient was evaluated in the emergency room, no hypoxia or other concerning signs, patient was discharged home with Eliquis loading dose for a week. -She is tolerating well, will continue indefinitely   

## 2023-02-13 NOTE — Assessment & Plan Note (Signed)
-  secondary to Abraxane. She has diabetes also  -overall mild and stable, she mainly has numbness and tingling in her feet.   

## 2023-02-14 ENCOUNTER — Inpatient Hospital Stay: Payer: Medicare HMO

## 2023-02-14 ENCOUNTER — Inpatient Hospital Stay: Payer: Medicare HMO | Attending: Physician Assistant

## 2023-02-14 ENCOUNTER — Other Ambulatory Visit: Payer: Self-pay

## 2023-02-14 ENCOUNTER — Other Ambulatory Visit (HOSPITAL_BASED_OUTPATIENT_CLINIC_OR_DEPARTMENT_OTHER): Payer: Self-pay

## 2023-02-14 ENCOUNTER — Encounter: Payer: Self-pay | Admitting: Hematology

## 2023-02-14 ENCOUNTER — Inpatient Hospital Stay: Payer: Medicare HMO | Admitting: Hematology

## 2023-02-14 VITALS — BP 138/74 | HR 70 | Temp 97.3°F | Resp 18 | Ht 62.0 in | Wt 143.9 lb

## 2023-02-14 DIAGNOSIS — Z95828 Presence of other vascular implants and grafts: Secondary | ICD-10-CM

## 2023-02-14 DIAGNOSIS — G620 Drug-induced polyneuropathy: Secondary | ICD-10-CM | POA: Diagnosis not present

## 2023-02-14 DIAGNOSIS — C787 Secondary malignant neoplasm of liver and intrahepatic bile duct: Secondary | ICD-10-CM | POA: Insufficient documentation

## 2023-02-14 DIAGNOSIS — T451X5A Adverse effect of antineoplastic and immunosuppressive drugs, initial encounter: Secondary | ICD-10-CM | POA: Diagnosis not present

## 2023-02-14 DIAGNOSIS — C250 Malignant neoplasm of head of pancreas: Secondary | ICD-10-CM

## 2023-02-14 DIAGNOSIS — Z86711 Personal history of pulmonary embolism: Secondary | ICD-10-CM | POA: Diagnosis not present

## 2023-02-14 DIAGNOSIS — I2693 Single subsegmental pulmonary embolism without acute cor pulmonale: Secondary | ICD-10-CM | POA: Diagnosis not present

## 2023-02-14 DIAGNOSIS — Z7182 Exercise counseling: Secondary | ICD-10-CM | POA: Insufficient documentation

## 2023-02-14 DIAGNOSIS — Z79899 Other long term (current) drug therapy: Secondary | ICD-10-CM | POA: Insufficient documentation

## 2023-02-14 DIAGNOSIS — Z7901 Long term (current) use of anticoagulants: Secondary | ICD-10-CM | POA: Insufficient documentation

## 2023-02-14 DIAGNOSIS — Z5111 Encounter for antineoplastic chemotherapy: Secondary | ICD-10-CM | POA: Insufficient documentation

## 2023-02-14 LAB — CBC WITH DIFFERENTIAL (CANCER CENTER ONLY)
Abs Immature Granulocytes: 0.01 10*3/uL (ref 0.00–0.07)
Basophils Absolute: 0 10*3/uL (ref 0.0–0.1)
Basophils Relative: 1 %
Eosinophils Absolute: 0.1 10*3/uL (ref 0.0–0.5)
Eosinophils Relative: 1 %
HCT: 28.7 % — ABNORMAL LOW (ref 36.0–46.0)
Hemoglobin: 10 g/dL — ABNORMAL LOW (ref 12.0–15.0)
Immature Granulocytes: 0 %
Lymphocytes Relative: 31 %
Lymphs Abs: 1.7 10*3/uL (ref 0.7–4.0)
MCH: 31.9 pg (ref 26.0–34.0)
MCHC: 34.8 g/dL (ref 30.0–36.0)
MCV: 91.7 fL (ref 80.0–100.0)
Monocytes Absolute: 0.6 10*3/uL (ref 0.1–1.0)
Monocytes Relative: 11 %
Neutro Abs: 3 10*3/uL (ref 1.7–7.7)
Neutrophils Relative %: 56 %
Platelet Count: 110 10*3/uL — ABNORMAL LOW (ref 150–400)
RBC: 3.13 MIL/uL — ABNORMAL LOW (ref 3.87–5.11)
RDW: 16.6 % — ABNORMAL HIGH (ref 11.5–15.5)
WBC Count: 5.3 10*3/uL (ref 4.0–10.5)
nRBC: 0 % (ref 0.0–0.2)

## 2023-02-14 LAB — CMP (CANCER CENTER ONLY)
ALT: 16 U/L (ref 0–44)
AST: 20 U/L (ref 15–41)
Albumin: 3.6 g/dL (ref 3.5–5.0)
Alkaline Phosphatase: 230 U/L — ABNORMAL HIGH (ref 38–126)
Anion gap: 7 (ref 5–15)
BUN: 12 mg/dL (ref 8–23)
CO2: 24 mmol/L (ref 22–32)
Calcium: 9.3 mg/dL (ref 8.9–10.3)
Chloride: 107 mmol/L (ref 98–111)
Creatinine: 0.57 mg/dL (ref 0.44–1.00)
GFR, Estimated: >60 mL/min (ref >60)
Glucose, Bld: 157 mg/dL — ABNORMAL HIGH (ref 70–99)
Potassium: 4 mmol/L (ref 3.5–5.1)
Sodium: 138 mmol/L (ref 135–145)
Total Bilirubin: 0.5 mg/dL (ref 0.3–1.2)
Total Protein: 6.5 g/dL (ref 6.5–8.1)

## 2023-02-14 MED ORDER — SODIUM CHLORIDE 0.9% FLUSH
10.0000 mL | Freq: Once | INTRAVENOUS | Status: AC
  Administered 2023-02-14: 10 mL

## 2023-02-14 MED ORDER — GABAPENTIN 100 MG PO CAPS
100.0000 mg | ORAL_CAPSULE | Freq: Two times a day (BID) | ORAL | 1 refills | Status: DC
  Filled 2023-02-14: qty 90, 23d supply, fill #0
  Filled 2023-04-04: qty 90, 23d supply, fill #1

## 2023-02-14 MED ORDER — DEXTROSE 5 % IV SOLN: Freq: Once | INTRAVENOUS | Status: AC

## 2023-02-14 MED ORDER — LIDOCAINE-PRILOCAINE 2.5-2.5 % EX CREA
1.0000 | TOPICAL_CREAM | CUTANEOUS | 1 refills | Status: DC | PRN
  Filled 2023-02-14: qty 30, 15d supply, fill #0

## 2023-02-14 MED ORDER — LEUCOVORIN CALCIUM INJECTION 350 MG
400.0000 mg/m2 | Freq: Once | INTRAVENOUS | Status: AC
  Administered 2023-02-14: 672 mg via INTRAVENOUS
  Filled 2023-02-14: qty 25

## 2023-02-14 MED ORDER — OXALIPLATIN CHEMO INJECTION 100 MG/20ML
70.0000 mg/m2 | Freq: Once | INTRAVENOUS | Status: AC
  Administered 2023-02-14: 120 mg via INTRAVENOUS
  Filled 2023-02-14: qty 24

## 2023-02-14 MED ORDER — SODIUM CHLORIDE 0.9 % IV SOLN
10.0000 mg | Freq: Once | INTRAVENOUS | Status: AC
  Administered 2023-02-14: 10 mg via INTRAVENOUS
  Filled 2023-02-14: qty 10

## 2023-02-14 MED ORDER — SODIUM CHLORIDE 0.9% FLUSH: 10.0000 mL | INTRAVENOUS | Status: DC | PRN

## 2023-02-14 MED ORDER — HEPARIN SOD (PORK) LOCK FLUSH 100 UNIT/ML IV SOLN: 500.0000 [IU] | Freq: Once | INTRAVENOUS | Status: DC | PRN

## 2023-02-14 MED ORDER — PALONOSETRON HCL INJECTION 0.25 MG/5ML
0.2500 mg | Freq: Once | INTRAVENOUS | Status: AC
  Administered 2023-02-14: 0.25 mg via INTRAVENOUS
  Filled 2023-02-14: qty 5

## 2023-02-14 MED ORDER — SODIUM CHLORIDE 0.9 % IV SOLN
2400.0000 mg/m2 | INTRAVENOUS | Status: DC
  Administered 2023-02-14: 4050 mg via INTRAVENOUS
  Filled 2023-02-14: qty 81

## 2023-02-14 NOTE — Patient Instructions (Signed)
[facility]  Discharge Instructions: Thank you for choosing [facility] to provide your oncology and hematology care.   If you have a lab appointment with the [facility], please go directly to the [facility] and check in at the registration area.   Wear comfortable clothing and clothing appropriate for easy access to any Portacath or PICC line.   We strive to give you quality time with your provider. You may need to reschedule your appointment if you arrive late (15 or more minutes).  Arriving late affects you and other patients whose appointments are after yours.  Also, if you miss three or more appointments without notifying the office, you may be dismissed from the clinic at the provider's discretion.      For prescription refill requests, have your pharmacy contact our office and allow 72 hours for refills to be completed.    Today you received the following chemotherapy and/or immunotherapy agents Oxaliplatin, leucovorin, fluorouracil.      To help prevent nausea and vomiting after your treatment, we encourage you to take your nausea medication as directed.  BELOW ARE SYMPTOMS THAT SHOULD BE REPORTED IMMEDIATELY: *FEVER GREATER THAN 100.4 F (38 C) OR HIGHER *CHILLS OR SWEATING *NAUSEA AND VOMITING THAT IS NOT CONTROLLED WITH YOUR NAUSEA MEDICATION *UNUSUAL SHORTNESS OF BREATH *UNUSUAL BRUISING OR BLEEDING *URINARY PROBLEMS (pain or burning when urinating, or frequent urination) *BOWEL PROBLEMS (unusual diarrhea, constipation, pain near the anus) TENDERNESS IN MOUTH AND THROAT WITH OR WITHOUT PRESENCE OF ULCERS (sore throat, sores in mouth, or a toothache) UNUSUAL RASH, SWELLING OR PAIN  UNUSUAL VAGINAL DISCHARGE OR ITCHING   Items with * indicate a potential emergency and should be followed up as soon as possible or go to the Emergency Department if any problems should occur.  Please show the CHEMOTHERAPY ALERT CARD or  IMMUNOTHERAPY ALERT CARD at check-in to the Emergency Department and triage nurse.  Should you have questions after your visit or need to cancel or reschedule your appointment, please contact [facility]  Dept: [phone]  and follow the prompts.  Office hours are 8:00 a.m. to 4:30 p.m. Monday - Friday. Please note that voicemails left after 4:00 p.m. may not be returned until the following business day.  We are closed weekends and major holidays. You have access to a nurse at all times for urgent questions. Please call the main number to the clinic Dept: [phone] and follow the prompts.   For any non-urgent questions, you may also contact your provider using MyChart. We now offer e-Visits for anyone 62 and older to request care online for non-urgent symptoms. For details visit [email or web address].   Also download the MyChart app! Go to the app store, search "MyChart", open the app, select [facility], and log in with your MyChart username and password.  The chemotherapy medication bag should finish at 46 hours, 96 hours, or 7 days. For example, if your pump is scheduled for 46 hours and it was put on at 4:00 p.m., it should finish at 2:00 p.m. the day it is scheduled to come off regardless of your appointment time.     Estimated time to finish at 11:30am Saturday.   If the display on your pump reads "Low Volume" and it is beeping, take the batteries out of the pump and come to the cancer center for it to be taken off.   If the pump alarms go off  prior to the pump reading "Low Volume" then call [phone] and someone can assist you.  If the plunger comes out and the chemotherapy medication is leaking out, please use your home chemo spill kit to clean up the spill. Do NOT use paper towels or other household products.  If you have problems or questions regarding your pump, please call either [phone] (24 hours a day) or the cancer center  Monday-Friday 8:00 a.m.- 4:30 p.m. at the clinic number and we will assist you. If you are unable to get assistance, then go to the nearest Emergency Department and ask the staff to contact the IV team for assistance.

## 2023-02-16 ENCOUNTER — Inpatient Hospital Stay: Payer: Medicare HMO

## 2023-02-16 VITALS — BP 154/83 | HR 57 | Temp 97.9°F | Resp 18

## 2023-02-16 DIAGNOSIS — C250 Malignant neoplasm of head of pancreas: Secondary | ICD-10-CM

## 2023-02-16 DIAGNOSIS — Z5111 Encounter for antineoplastic chemotherapy: Secondary | ICD-10-CM | POA: Diagnosis not present

## 2023-02-16 MED ORDER — HEPARIN SOD (PORK) LOCK FLUSH 100 UNIT/ML IV SOLN
500.0000 [IU] | Freq: Once | INTRAVENOUS | Status: AC | PRN
  Administered 2023-02-16: 500 [IU]

## 2023-02-16 MED ORDER — SODIUM CHLORIDE 0.9% FLUSH
10.0000 mL | INTRAVENOUS | Status: DC | PRN
  Administered 2023-02-16: 10 mL

## 2023-02-26 ENCOUNTER — Other Ambulatory Visit: Payer: Self-pay | Admitting: Hematology

## 2023-02-27 ENCOUNTER — Encounter: Payer: Self-pay | Admitting: Hematology

## 2023-02-27 ENCOUNTER — Telehealth: Payer: Self-pay | Admitting: Hematology

## 2023-02-27 ENCOUNTER — Other Ambulatory Visit: Payer: Self-pay | Admitting: Hematology

## 2023-02-27 DIAGNOSIS — C250 Malignant neoplasm of head of pancreas: Secondary | ICD-10-CM

## 2023-02-27 MED FILL — Dexamethasone Sodium Phosphate Inj 100 MG/10ML: INTRAMUSCULAR | Qty: 1 | Status: AC

## 2023-02-27 NOTE — Telephone Encounter (Signed)
Patient called scheduling line morning of 4/17 to reschedule her 4/18 tx appointments. Spoke with MD to arrange. Left voicemail with new appointment date/time for patient.

## 2023-02-28 ENCOUNTER — Inpatient Hospital Stay: Payer: Medicare HMO | Admitting: Hematology

## 2023-02-28 ENCOUNTER — Inpatient Hospital Stay: Payer: Medicare HMO

## 2023-03-01 ENCOUNTER — Other Ambulatory Visit: Payer: Self-pay

## 2023-03-02 ENCOUNTER — Inpatient Hospital Stay: Payer: Medicare HMO

## 2023-03-06 MED FILL — Dexamethasone Sodium Phosphate Inj 100 MG/10ML: INTRAMUSCULAR | Qty: 1 | Status: AC

## 2023-03-06 NOTE — Assessment & Plan Note (Signed)
-  secondary to Abraxane. She has diabetes also  -overall mild and stable, she mainly has numbness and tingling in her feet.   

## 2023-03-06 NOTE — Assessment & Plan Note (Signed)
-  Staging CT scan from December 25 2022 showed pulmonary embolism  in the left main pulmonary artery extending into the left upper and lower lobe branches. No right-sided pulmonary emboli. No findings for right heart strain.  -Patient was evaluated in the emergency room, no hypoxia or other concerning signs, patient was discharged home with Eliquis loading dose for a week. -She is tolerating well, will continue indefinitely   

## 2023-03-06 NOTE — Assessment & Plan Note (Signed)
stage IV c(T2, N0, M1) with liver metastasis, MMR normal, Tempus (-) -Diagnosed in 01/2022. presented to PCP with jaundice for one month. Also complained of epigastric pain, poor appetite, weight loss, and light-colored stool.  -she began neoadjuvant gemcitabine alone on 02/16/22, abraxane was added with C2 (03/08/22).   -liver biopsy confirmed metastatic cancer  -Due to cancer progression, chemo changed to second line FOLFIRI on November 2023 -She unfortunately did not respond well to chemo, restaging scan from February 2024 showed disease progression in liver again -She was changed to third line chemotherapy FOLFOX on 01/03/23, she is overall tolerating well

## 2023-03-07 ENCOUNTER — Inpatient Hospital Stay: Payer: Medicare HMO

## 2023-03-07 ENCOUNTER — Encounter: Payer: Self-pay | Admitting: Hematology

## 2023-03-07 ENCOUNTER — Inpatient Hospital Stay: Payer: Medicare HMO | Admitting: Hematology

## 2023-03-07 ENCOUNTER — Other Ambulatory Visit: Payer: Self-pay

## 2023-03-07 VITALS — BP 143/96 | HR 80 | Temp 98.2°F | Resp 18 | Ht 62.0 in | Wt 142.7 lb

## 2023-03-07 DIAGNOSIS — C250 Malignant neoplasm of head of pancreas: Secondary | ICD-10-CM

## 2023-03-07 DIAGNOSIS — Z95828 Presence of other vascular implants and grafts: Secondary | ICD-10-CM

## 2023-03-07 DIAGNOSIS — Z5111 Encounter for antineoplastic chemotherapy: Secondary | ICD-10-CM | POA: Diagnosis not present

## 2023-03-07 DIAGNOSIS — I2693 Single subsegmental pulmonary embolism without acute cor pulmonale: Secondary | ICD-10-CM

## 2023-03-07 DIAGNOSIS — T451X5A Adverse effect of antineoplastic and immunosuppressive drugs, initial encounter: Secondary | ICD-10-CM

## 2023-03-07 DIAGNOSIS — G620 Drug-induced polyneuropathy: Secondary | ICD-10-CM | POA: Diagnosis not present

## 2023-03-07 LAB — CMP (CANCER CENTER ONLY)
ALT: 17 U/L (ref 0–44)
AST: 28 U/L (ref 15–41)
Albumin: 3.6 g/dL (ref 3.5–5.0)
Alkaline Phosphatase: 223 U/L — ABNORMAL HIGH (ref 38–126)
Anion gap: 5 (ref 5–15)
BUN: 13 mg/dL (ref 8–23)
CO2: 25 mmol/L (ref 22–32)
Calcium: 9.2 mg/dL (ref 8.9–10.3)
Chloride: 110 mmol/L (ref 98–111)
Creatinine: 0.63 mg/dL (ref 0.44–1.00)
GFR, Estimated: >60 mL/min (ref >60)
Glucose, Bld: 140 mg/dL — ABNORMAL HIGH (ref 70–99)
Potassium: 3.8 mmol/L (ref 3.5–5.1)
Sodium: 140 mmol/L (ref 135–145)
Total Bilirubin: 0.4 mg/dL (ref 0.3–1.2)
Total Protein: 6.4 g/dL — ABNORMAL LOW (ref 6.5–8.1)

## 2023-03-07 LAB — CBC WITH DIFFERENTIAL (CANCER CENTER ONLY)
Abs Immature Granulocytes: 0.01 10*3/uL (ref 0.00–0.07)
Basophils Absolute: 0 10*3/uL (ref 0.0–0.1)
Basophils Relative: 1 %
Eosinophils Absolute: 0.1 10*3/uL (ref 0.0–0.5)
Eosinophils Relative: 1 %
HCT: 30.1 % — ABNORMAL LOW (ref 36.0–46.0)
Hemoglobin: 10.1 g/dL — ABNORMAL LOW (ref 12.0–15.0)
Immature Granulocytes: 0 %
Lymphocytes Relative: 41 %
Lymphs Abs: 1.8 10*3/uL (ref 0.7–4.0)
MCH: 31 pg (ref 26.0–34.0)
MCHC: 33.6 g/dL (ref 30.0–36.0)
MCV: 92.3 fL (ref 80.0–100.0)
Monocytes Absolute: 0.4 10*3/uL (ref 0.1–1.0)
Monocytes Relative: 10 %
Neutro Abs: 2 10*3/uL (ref 1.7–7.7)
Neutrophils Relative %: 47 %
Platelet Count: 134 10*3/uL — ABNORMAL LOW (ref 150–400)
RBC: 3.26 MIL/uL — ABNORMAL LOW (ref 3.87–5.11)
RDW: 16.6 % — ABNORMAL HIGH (ref 11.5–15.5)
WBC Count: 4.3 10*3/uL (ref 4.0–10.5)
nRBC: 0 % (ref 0.0–0.2)

## 2023-03-07 MED ORDER — SODIUM CHLORIDE 0.9% FLUSH: 10.0000 mL | INTRAVENOUS | Status: DC | PRN

## 2023-03-07 MED ORDER — DEXTROSE 5 % IV SOLN: Freq: Once | INTRAVENOUS | Status: AC

## 2023-03-07 MED ORDER — SODIUM CHLORIDE 0.9% FLUSH
10.0000 mL | Freq: Once | INTRAVENOUS | Status: AC
  Administered 2023-03-07: 10 mL

## 2023-03-07 MED ORDER — PALONOSETRON HCL INJECTION 0.25 MG/5ML
0.2500 mg | Freq: Once | INTRAVENOUS | Status: AC
  Administered 2023-03-07: 0.25 mg via INTRAVENOUS
  Filled 2023-03-07: qty 5

## 2023-03-07 MED ORDER — LEUCOVORIN CALCIUM INJECTION 350 MG
400.0000 mg/m2 | Freq: Once | INTRAVENOUS | Status: AC
  Administered 2023-03-07: 672 mg via INTRAVENOUS
  Filled 2023-03-07: qty 33.6

## 2023-03-07 MED ORDER — OXALIPLATIN CHEMO INJECTION 100 MG/20ML
70.0000 mg/m2 | Freq: Once | INTRAVENOUS | Status: AC
  Administered 2023-03-07: 120 mg via INTRAVENOUS
  Filled 2023-03-07: qty 24

## 2023-03-07 MED ORDER — HEPARIN SOD (PORK) LOCK FLUSH 100 UNIT/ML IV SOLN: 500.0000 [IU] | Freq: Once | INTRAVENOUS | Status: DC | PRN

## 2023-03-07 MED ORDER — SODIUM CHLORIDE 0.9 % IV SOLN
2400.0000 mg/m2 | INTRAVENOUS | Status: DC
  Administered 2023-03-07: 4050 mg via INTRAVENOUS
  Filled 2023-03-07: qty 81

## 2023-03-07 MED ORDER — SODIUM CHLORIDE 0.9 % IV SOLN
10.0000 mg | Freq: Once | INTRAVENOUS | Status: AC
  Administered 2023-03-07: 10 mg via INTRAVENOUS
  Filled 2023-03-07: qty 10
  Filled 2023-03-07: qty 1

## 2023-03-07 NOTE — Patient Instructions (Addendum)
[facility]  Discharge Instructions: Thank you for choosing [facility] to provide your oncology and hematology care.   If you have a lab appointment with the Cancer Center, please go directly to the Cancer Center and check in at the registration area.   Wear comfortable clothing and clothing appropriate for easy access to any Portacath or PICC line.   We strive to give you quality time with your provider. You may need to reschedule your appointment if you arrive late (15 or more minutes).  Arriving late affects you and other patients whose appointments are after yours.  Also, if you miss three or more appointments without notifying the office, you may be dismissed from the clinic at the provider's discretion.      For prescription refill requests, have your pharmacy contact our office and allow 72 hours for refills to be completed.    Today you received the following chemotherapy and/or immunotherapy agents: Oxaliplatin and Fluorouracil        To help prevent nausea and vomiting after your treatment, we encourage you to take your nausea medication as directed.  BELOW ARE SYMPTOMS THAT SHOULD BE REPORTED IMMEDIATELY: *FEVER GREATER THAN 100.4 F (38 C) OR HIGHER *CHILLS OR SWEATING *NAUSEA AND VOMITING THAT IS NOT CONTROLLED WITH YOUR NAUSEA MEDICATION *UNUSUAL SHORTNESS OF BREATH *UNUSUAL BRUISING OR BLEEDING *URINARY PROBLEMS (pain or burning when urinating, or frequent urination) *BOWEL PROBLEMS (unusual diarrhea, constipation, pain near the anus) TENDERNESS IN MOUTH AND THROAT WITH OR WITHOUT PRESENCE OF ULCERS (sore throat, sores in mouth, or a toothache) UNUSUAL RASH, SWELLING OR PAIN  UNUSUAL VAGINAL DISCHARGE OR ITCHING   Items with * indicate a potential emergency and should be followed up as soon as possible or go to the Emergency Department if any problems should occur.  Please show the CHEMOTHERAPY ALERT CARD or  IMMUNOTHERAPY ALERT CARD at check-in to the Emergency Department and triage nurse.  Should you have questions after your visit or need to cancel or reschedule your appointment, please contact [facility]  Dept: [phone]  and follow the prompts.  Office hours are 8:00 a.m. to 4:30 p.m. Monday - Friday. Please note that voicemails left after 4:00 p.m. may not be returned until the following business day.  We are closed weekends and major holidays. You have access to a nurse at all times for urgent questions. Please call the main number to the clinic Dept: [phone] and follow the prompts.   For any non-urgent questions, you may also contact your provider using MyChart. We now offer e-Visits for anyone 18 and older to request care online for non-urgent symptoms. For details visit [email or web address].   Also download the MyChart app! Go to the app store, search "MyChart", open the app, select [facility], and log in with your MyChart username and password.   

## 2023-03-07 NOTE — Progress Notes (Signed)
[facility]   Telephone:([phone]) [phone] [phone]   Clinic Follow up Note   Patient Care Team: Karle Plumber, MD as PCP - General (Internal Medicine) Maisie Fus, MD as PCP - Cardiology (Cardiology) Malachy Mood, MD as Consulting Physician (Oncology)  Date of Service:  03/07/2023  CHIEF COMPLAINT: f/u of pancreatic cancer   CURRENT THERAPY:  start of third line FOLFOX   ASSESSMENT:  Miranda Francis is a 76 y.o. female with   Pancreatic cancer (HCC) stage IV c(T2, N0, M1) with liver metastasis, MMR normal, Tempus (-) -Diagnosed in 01/2022. presented to PCP with jaundice for one month. Also complained of epigastric pain, poor appetite, weight loss, and light-colored stool.  -she began neoadjuvant gemcitabine alone on 02/16/22, abraxane was added with C2 (03/08/22).   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 She has diabetes also  -overall mild and stable, she mainly has numbness and tingling in her feet.    Pulmonary embolism (HCC) -Staging CT scan from December 25 2022 showed pulmonary embolism  in the left main pulmonary artery extending into the left upper and lower lobe branches. No right-sided pulmonary emboli. No findings for right heart strain.  -Patient was evaluated in the emergency room, no hypoxia or other concerning signs, patient was discharged home with Eliquis loading dose for a week. -She is tolerating well, will continue indefinitely     PLAN: -lab  reviewed --proceed with C4 FOLFOX today -Encourage exercise -order CT scan in 4 weeks  SUMMARY OF ONCOLOGIC HISTORY: Oncology History Overview Note   Cancer Staging  Pancreatic cancer [facility]) Staging form: Exocrine Pancreas, AJCC 8th Edition - Clinical stage from 01/18/2022: Stage IB (cT2, cN0, cM0) - Signed by Malachy Mood, MD on 01/28/2022    Pancreatic cancer  01/16/2022 Imaging   CLINICAL DATA:  Jaundice   EXAM: CT ABDOMEN AND PELVIS WITH CONTRAST  IMPRESSION: 1. Marked intra and extrahepatic biliary dilatation with marked dilatation of pancreatic duct and distended gallbladder. There is significant atrophy of the body and tail of pancreas. 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 2. Diverticular disease of the colon without acute wall thickening 3. Mild endometrial thickening up to 9 mm, recommend nonemergent pelvic ultrasound for further evaluation   01/17/2022 Imaging   EXAM: MRI ABDOMEN WITHOUT AND WITH CONTRAST (INCLUDING MRCP)  IMPRESSION: 1. Obstructing mass within the pancreatic head with an intraluminal component demonstrating low level enhancement. 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 2. This mass causes significant extrinsic narrowing at the portal/superior mesenteric vein confluence as well as marked intra and extrahepatic biliary dilatation. 3. No distant metastases identified. 4. Endoscopy for endoscopic ultrasound, tissue sampling and biliary decompression recommended.   01/18/2022 Cancer Staging   Staging form: Exocrine Pancreas, AJCC 8th Edition - Clinical stage from 01/18/2022: Stage  IB (cT2, cN0, cM0) - Signed by Malachy Mood, MD on 01/28/2022 Stage  prefix: Initial diagnosis Total positive nodes: 0   01/18/2022 Initial Biopsy   FINAL MICROSCOPIC DIAGNOSIS:  A. PANCREAS, HEAD, FINE NEEDLE ASPIRATION:  - Malignant cells present consistent with adenocarcinoma   01/24/2022 Imaging   EXAM: CT CHEST WITHOUT CONTRAST  IMPRESSION: 1. No evidence of metastatic disease in the chest. 2. Atelectasis or consolidation in the right lower lung. 3. Healing right rib fractures. 4. Mass in the head of the pancreas. See previous report of CT abdomen and pelvis 01/16/2022. Interval placement of the biliary stent.   01/28/2022 Initial Diagnosis   Pancreatic cancer (HCC)   02/16/2022 - 06/28/2022 Chemotherapy   Patient is on Treatment Plan : PANCREATIC Abraxane / Gemcitabine D1,8 q21d     02/16/2022 Genetic Testing   Negative genetic testing on the CancerNext-Expanded+RNAinsight panel.  The report date is February 16, 2022.  The CancerNext-Expanded gene panel offered by [facility] and includes sequencing and rearrangement analysis for the following 77 genes: AIP, ALK, APC*, ATM*, AXIN2, BAP1, BARD1, BLM, BMPR1A, BRCA1*, BRCA2*, BRIP1*, CDC73, CDH1*, CDK4, CDKN1B, CDKN2A, CHEK2*, CTNNA1, DICER1, FANCC, FH, FLCN, GALNT12, KIF1B, LZTR1, MAX, MEN1, MET, MLH1*, MSH2*, MSH3, MSH6*, MUTYH*, NBN, NF1*, NF2, NTHL1, PALB2*, PHOX2B, PMS2*, POT1, PRKAR1A, PTCH1, PTEN*, RAD51C*, RAD51D*, RB1, RECQL, RET, SDHA, SDHAF2, SDHB, SDHC, SDHD, SMAD4, SMARCA4, SMARCB1, SMARCE1, STK11, SUFU, TMEM127, TP53*, TSC1, TSC2, VHL and XRCC2 (sequencing and deletion/duplication); EGFR, EGLN1, HOXB13, KIT, MITF, PDGFRA, POLD1, and POLE (sequencing only); EPCAM and GREM1 (deletion/duplication only). DNA and RNA analyses performed for * genes.    06/14/2022 - 09/19/2022 Chemotherapy   Patient is on Treatment Plan : PANCREATIC Abraxane D1,8,15 + Gemcitabine D1,8,15 q28d     10/11/2022 - 12/08/2022 Chemotherapy   Patient is on Treatment Plan : PANCREAS Liposomal Irinotecan + Leucovorin + 5-FU IVCI  q14d     12/25/2022 Imaging    IMPRESSION: 1. Left-sided pulmonary emboli. No findings for right heart strain. 2. Stable small left upper lobe pulmonary nodules. No new or progressive findings in the chest. 3. Overall stable appearing infiltrating pancreatic neoplasm. 4. New and interval enlargement of hepatic metastatic disease. 5. Biliary stent in place with associated pneumobilia and gas in the gallbladder. Mild to moderate biliary dilatation. 6. 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 Extensive portal venous collateral vessels.     01/03/2023 -  Chemotherapy   Patient is on Treatment Plan : PANCREAS FOLFOX q14d      Pathology Results      Pathology Results      Miscellaneous         INTERVAL HISTORY:  Miranda Francis is here for a follow up of pancreatic cancer. She was last seen by me on 02/14/2023. She presents to the clinic accompanied by husband. Pt state treatment went well, she has some fatigue and she has some chills, but denies having a fever.Pt state that she occasionally have some pain. Pt state that she has some numbness in her left feet.   All other systems were reviewed with the patient and are negative.  MEDICAL HISTORY:  Past Medical History:  Diagnosis Date   Blood transfusion    Diabetes mellitus    Family history of breast cancer    Family history of colon cancer    Family history of prostate cancer    Heart murmur    Hypertension    pancreatic ca 12/2021    SURGICAL HISTORY: Past Surgical  History:  Procedure Laterality Date   BILIARY STENT PLACEMENT  01/18/2022   Procedure: BILIARY STENT PLACEMENT;  Surgeon: Jeani Hawking, MD;  Location: [facility] ENDOSCOPY;  Service: Gastroenterology;;   DILATION AND CURETTAGE OF UTERUS     ERCP N/A 01/18/2022   Procedure: ENDOSCOPIC RETROGRADE CHOLANGIOPANCREATOGRAPHY (ERCP);  Surgeon: Jeani Hawking, MD;  Location: [facility] ENDOSCOPY;  Service: Gastroenterology;  Laterality: N/A;    ESOPHAGOGASTRODUODENOSCOPY N/A 01/18/2022   Procedure: ESOPHAGOGASTRODUODENOSCOPY (EGD);  Surgeon: Jeani Hawking, MD;  Location: [facility] ENDOSCOPY;  Service: Gastroenterology;  Laterality: N/A;   EUS  01/18/2022   Procedure: UPPER ENDOSCOPIC ULTRASOUND (EUS) LINEAR;  Surgeon: Jeani Hawking, MD;  Location: [facility] ENDOSCOPY;  Service: Gastroenterology;;   FINE NEEDLE ASPIRATION  01/18/2022   Procedure: FINE NEEDLE ASPIRATION (FNA) LINEAR;  Surgeon: Jeani Hawking, MD;  Location: [facility] ENDOSCOPY;  Service: Gastroenterology;;   IR IMAGING GUIDED PORT INSERTION  02/15/2022   NO PAST SURGERIES     SPHINCTEROTOMY  01/18/2022   Procedure: Dennison Mascot;  Surgeon: Jeani Hawking, MD;  Location: [facility] ENDOSCOPY;  Service: Gastroenterology;;    I have reviewed the social history and family history with the patient and they are unchanged from previous note.  ALLERGIES:  is allergic to irinotecan liposome.  MEDICATIONS:  Current Outpatient Medications  Medication Sig Dispense Refill   acetaminophen (TYLENOL) 500 MG tablet Take 1,000 mg by mouth every 6 (six) hours as needed for moderate pain or headache.     amLODipine (NORVASC) 5 MG tablet TAKE 1 TABLET (5 MG TOTAL) BY MOUTH DAILY. 90 tablet 1   apixaban (ELIQUIS) 5 MG TABS tablet Take 1 tablet (5 mg total) by mouth 2 (two) times daily. 60 tablet 2   APIXABAN (ELIQUIS) VTE STARTER PACK (10MG  AND 5MG ) Take as directed on package: start with two-5mg  tablets twice daily for 7 days. On day 8, switch to one-5mg  tablet twice daily. 74 each 0   Cholecalciferol (QC VITAMIN D3) 50 MCG (2000 UT) TABS Take 2,000 Units by mouth daily.     Continuous Blood Gluc Sensor (FREESTYLE LIBRE 2 SENSOR) MISC USE 1 SENSOR AND CHANGE EVERY 14 DAYS AS DIRECTED.     dexamethasone (DECADRON) 4 MG tablet Take 2 tablets (8 mg total) by mouth daily. Start the day after chemotherapy for 2 days. Take with food. 30 tablet 1   diphenoxylate-atropine (LOMOTIL) 2.5-0.025 MG tablet Take 1 tablet by mouth 4 (four)  times daily as needed for diarrhea or loose stools. 30 tablet 0   gabapentin (NEURONTIN) 100 MG capsule Take 1-2 capsules (100-200 mg total) by mouth 2 (two) times daily. 90 capsule 1   KLOR-CON M20 20 MEQ tablet Take 20 mEq by mouth 2 (two) times daily.     lidocaine-prilocaine (EMLA) cream Apply to affected area once 30 g 3   lidocaine-prilocaine (EMLA) cream Apply 1 application topically as needed. 30 g 1   losartan (COZAAR) 100 MG tablet Take 100 mg by mouth daily.     NOVOLOG FLEXPEN 100 UNIT/ML FlexPen Inject 15 Units into the skin 2 (two) times daily.     ondansetron (ZOFRAN) 8 MG tablet Take 1 tablet (8 mg total) by mouth every 8 (eight) hours as needed for nausea or vomiting. Start on the third day after chemotherapy. 30 tablet 1   oxyCODONE (OXY IR/ROXICODONE) 5 MG immediate release tablet Take 1 tablet (5 mg total) by mouth every 3 (three) hours as needed for severe pain. 20 tablet 0   polyethylene glycol (MIRALAX / GLYCOLAX) 17 g packet  Take 17 g by mouth daily. 14 each 0   prochlorperazine (COMPAZINE) 10 MG tablet Take 1 tablet (10 mg total) by mouth every 6 (six) hours as needed for nausea or vomiting. 30 tablet 1   zolpidem (AMBIEN) 10 MG tablet Take 10 mg by mouth at bedtime as needed.     No current facility-administered medications for this visit.   Facility-Administered Medications Ordered in Other Visits  Medication Dose Route Frequency Provider Last Rate Last Admin   fluorouracil (ADRUCIL) 4,050 mg in sodium chloride 0.9 % 69 mL chemo infusion  2,400 mg/m2 (Treatment Plan Recorded) Intravenous 1 day or 1 dose Malachy Mood, MD   Infusion Verify at 03/07/23 1345   heparin lock flush 100 unit/mL  500 Units Intracatheter Once PRN Malachy Mood, MD       sodium chloride flush (NS) 0.9 % injection 10 mL  10 mL Intracatheter PRN Malachy Mood, MD        PHYSICAL EXAMINATION: ECOG PERFORMANCE STATUS: 1 - Symptomatic but completely ambulatory  Vitals:   03/07/23 0911  BP: (!) 143/96   Pulse: 80  Resp: 18  Temp: 98.2 F (36.8 C)  SpO2: 100%   Wt Readings from Last 3 Encounters:  03/07/23 142 lb 11.2 oz (64.7 kg)  02/14/23 143 lb 14.4 oz (65.3 kg)  01/31/23 142 lb 6.4 oz (64.6 kg)     GENERAL:alert, no distress and comfortable SKIN: skin color normal, no rashes or significant lesions EYES: normal, Conjunctiva are pink and non-injected, sclera clear  NEURO: alert & oriented x 3 with fluent speech  LABORATORY DATA:  I have reviewed the data as listed    Latest Ref Rng & Units 03/07/2023    8:44 AM 02/14/2023    8:35 AM 01/31/2023    9:16 AM  CBC  WBC 4.0 - 10.5 K/uL 4.3  5.3  5.7   Hemoglobin 12.0 - 15.0 g/dL 16.1  09.6  04.5   Hematocrit 36.0 - 46.0 % 30.1  28.7  30.7   Platelets 150 - 400 K/uL 134  110  125         Latest Ref Rng & Units 03/07/2023    8:44 AM 02/14/2023    8:35 AM 01/31/2023    9:16 AM  CMP  Glucose 70 - 99 mg/dL 409  811  914   BUN 8 - 23 mg/dL 13  12  13    Creatinine 0.44 - 1.00 mg/dL 7.82  9.56  2.13   Sodium 135 - 145 mmol/L 140  138  139   Potassium 3.5 - 5.1 mmol/L 3.8  4.0  3.7   Chloride 98 - 111 mmol/L 110  107  108   CO2 22 - 32 mmol/L 25  24  25    Calcium 8.9 - 10.3 mg/dL 9.2  9.3  9.0   Total Protein 6.5 - 8.1 g/dL 6.4  6.5  6.5   Total Bilirubin 0.3 - 1.2 mg/dL 0.4  0.5  0.5   Alkaline Phos 38 - 126 U/L 223  230  258   AST 15 - 41 U/L 28  20  26    ALT 0 - 44 U/L 17  16  18        RADIOGRAPHIC STUDIES: I have personally reviewed the radiological images as listed and agreed with the findings in the report. No results found.    Orders Placed This Encounter  Procedures   CT CHEST ABDOMEN PELVIS W CONTRAST    Standing Status:  Future    Standing Expiration Date:   03/06/2024    Order Specific Question:   If indicated for the ordered procedure, I authorize the administration of contrast media per Radiology protocol    Answer:   Yes    Order Specific Question:   Does the patient have a contrast media/X-ray dye  allergy?    Answer:   No    Order Specific Question:   Preferred imaging location?    Answer:   [facility]    Order Specific Question:   If indicated for the ordered procedure, I authorize the administration of oral contrast media per Radiology protocol    Answer:   Yes   CBC with Differential (Cancer Center Only)    Standing Status:   Future    Standing Expiration Date:   03/19/2024   CMP (Cancer Center only)    Standing Status:   Future    Standing Expiration Date:   03/19/2024   CBC with Differential (Cancer Center Only)    Standing Status:   Future    Standing Expiration Date:   04/02/2024   CMP (Cancer Center only)    Standing Status:   Future    Standing Expiration Date:   04/02/2024   CBC with Differential (Cancer Center Only)    Standing Status:   Future    Standing Expiration Date:   04/16/2024   CMP (Cancer Center only)    Standing Status:   Future    Standing Expiration Date:   04/16/2024   All questions were answered. The patient knows to call the clinic with any problems, questions or concerns. No barriers to learning was detected. The total time spent in the appointment was 25 minutes.     Malachy Mood, MD 03/07/2023   Carolin Coy, CMA, am acting as scribe for Malachy Mood, MD.   I have reviewed the above documentation for accuracy and completeness, and I agree with the above.

## 2023-03-08 ENCOUNTER — Other Ambulatory Visit: Payer: Self-pay

## 2023-03-09 ENCOUNTER — Other Ambulatory Visit: Payer: Self-pay

## 2023-03-09 ENCOUNTER — Inpatient Hospital Stay: Payer: Medicare HMO

## 2023-03-09 VITALS — BP 160/88 | HR 51 | Temp 98.9°F | Resp 18

## 2023-03-09 DIAGNOSIS — C250 Malignant neoplasm of head of pancreas: Secondary | ICD-10-CM

## 2023-03-09 DIAGNOSIS — Z95828 Presence of other vascular implants and grafts: Secondary | ICD-10-CM

## 2023-03-09 DIAGNOSIS — Z5111 Encounter for antineoplastic chemotherapy: Secondary | ICD-10-CM | POA: Diagnosis not present

## 2023-03-09 MED ORDER — HEPARIN SOD (PORK) LOCK FLUSH 100 UNIT/ML IV SOLN
500.0000 [IU] | Freq: Once | INTRAVENOUS | Status: AC
  Administered 2023-03-09: 500 [IU]

## 2023-03-09 MED ORDER — SODIUM CHLORIDE 0.9 % IV SOLN: Freq: Once | INTRAVENOUS | Status: AC

## 2023-03-09 MED ORDER — DEXAMETHASONE 4 MG PO TABS
8.0000 mg | ORAL_TABLET | Freq: Once | ORAL | Status: AC
  Administered 2023-03-09: 8 mg via ORAL
  Filled 2023-03-09: qty 2

## 2023-03-09 MED ORDER — PROCHLORPERAZINE EDISYLATE 10 MG/2ML IJ SOLN
10.0000 mg | Freq: Once | INTRAMUSCULAR | Status: AC
  Administered 2023-03-09: 10 mg via INTRAVENOUS
  Filled 2023-03-09: qty 2

## 2023-03-09 MED ORDER — SODIUM CHLORIDE 0.9% FLUSH
10.0000 mL | Freq: Once | INTRAVENOUS | Status: AC
  Administered 2023-03-09: 10 mL

## 2023-03-09 NOTE — Progress Notes (Signed)
Pt arrived to pump stop/start appt with c/o nausea and not being able to drink much or eat with 49 pulse. Contacted Dr.Feng about symptoms, and recommendations, vo given for dexamethasone 8 mg, IV compazine and 500 ml IVF to be given.

## 2023-03-11 LAB — CANCER ANTIGEN 19-9: CA 19-9: 80862 U/mL — ABNORMAL HIGH (ref 0–35)

## 2023-03-20 MED FILL — Dexamethasone Sodium Phosphate Inj 100 MG/10ML: INTRAMUSCULAR | Qty: 1 | Status: AC

## 2023-03-21 ENCOUNTER — Inpatient Hospital Stay: Payer: Medicare HMO

## 2023-03-21 ENCOUNTER — Inpatient Hospital Stay: Payer: Medicare HMO | Attending: Physician Assistant | Admitting: Hematology

## 2023-03-21 ENCOUNTER — Other Ambulatory Visit: Payer: Self-pay

## 2023-03-21 ENCOUNTER — Encounter: Payer: Self-pay | Admitting: Hematology

## 2023-03-21 VITALS — BP 156/84 | HR 55 | Temp 98.2°F | Resp 16 | Ht 62.0 in | Wt 142.3 lb

## 2023-03-21 DIAGNOSIS — Z800 Family history of malignant neoplasm of digestive organs: Secondary | ICD-10-CM | POA: Diagnosis not present

## 2023-03-21 DIAGNOSIS — Z7901 Long term (current) use of anticoagulants: Secondary | ICD-10-CM | POA: Insufficient documentation

## 2023-03-21 DIAGNOSIS — Z803 Family history of malignant neoplasm of breast: Secondary | ICD-10-CM | POA: Insufficient documentation

## 2023-03-21 DIAGNOSIS — Z8042 Family history of malignant neoplasm of prostate: Secondary | ICD-10-CM | POA: Insufficient documentation

## 2023-03-21 DIAGNOSIS — E119 Type 2 diabetes mellitus without complications: Secondary | ICD-10-CM | POA: Insufficient documentation

## 2023-03-21 DIAGNOSIS — C250 Malignant neoplasm of head of pancreas: Secondary | ICD-10-CM

## 2023-03-21 DIAGNOSIS — Z5111 Encounter for antineoplastic chemotherapy: Secondary | ICD-10-CM | POA: Insufficient documentation

## 2023-03-21 DIAGNOSIS — E860 Dehydration: Secondary | ICD-10-CM | POA: Insufficient documentation

## 2023-03-21 DIAGNOSIS — Z95828 Presence of other vascular implants and grafts: Secondary | ICD-10-CM

## 2023-03-21 DIAGNOSIS — Z79899 Other long term (current) drug therapy: Secondary | ICD-10-CM | POA: Insufficient documentation

## 2023-03-21 DIAGNOSIS — Z86711 Personal history of pulmonary embolism: Secondary | ICD-10-CM | POA: Insufficient documentation

## 2023-03-21 DIAGNOSIS — G620 Drug-induced polyneuropathy: Secondary | ICD-10-CM | POA: Diagnosis not present

## 2023-03-21 DIAGNOSIS — C787 Secondary malignant neoplasm of liver and intrahepatic bile duct: Secondary | ICD-10-CM | POA: Diagnosis present

## 2023-03-21 DIAGNOSIS — Z794 Long term (current) use of insulin: Secondary | ICD-10-CM | POA: Insufficient documentation

## 2023-03-21 LAB — CBC WITH DIFFERENTIAL (CANCER CENTER ONLY)
Abs Immature Granulocytes: 0.01 10*3/uL (ref 0.00–0.07)
Basophils Absolute: 0 10*3/uL (ref 0.0–0.1)
Basophils Relative: 1 %
Eosinophils Absolute: 0.1 10*3/uL (ref 0.0–0.5)
Eosinophils Relative: 1 %
HCT: 29.5 % — ABNORMAL LOW (ref 36.0–46.0)
Hemoglobin: 9.8 g/dL — ABNORMAL LOW (ref 12.0–15.0)
Immature Granulocytes: 0 %
Lymphocytes Relative: 37 %
Lymphs Abs: 1.6 10*3/uL (ref 0.7–4.0)
MCH: 30.9 pg (ref 26.0–34.0)
MCHC: 33.2 g/dL (ref 30.0–36.0)
MCV: 93.1 fL (ref 80.0–100.0)
Monocytes Absolute: 0.5 10*3/uL (ref 0.1–1.0)
Monocytes Relative: 11 %
Neutro Abs: 2.2 10*3/uL (ref 1.7–7.7)
Neutrophils Relative %: 50 %
Platelet Count: 105 10*3/uL — ABNORMAL LOW (ref 150–400)
RBC: 3.17 MIL/uL — ABNORMAL LOW (ref 3.87–5.11)
RDW: 16.6 % — ABNORMAL HIGH (ref 11.5–15.5)
WBC Count: 4.4 10*3/uL (ref 4.0–10.5)
nRBC: 0 % (ref 0.0–0.2)

## 2023-03-21 LAB — CMP (CANCER CENTER ONLY)
ALT: 16 U/L (ref 0–44)
AST: 23 U/L (ref 15–41)
Albumin: 3.5 g/dL (ref 3.5–5.0)
Alkaline Phosphatase: 233 U/L — ABNORMAL HIGH (ref 38–126)
Anion gap: 6 (ref 5–15)
BUN: 12 mg/dL (ref 8–23)
CO2: 24 mmol/L (ref 22–32)
Calcium: 8.9 mg/dL (ref 8.9–10.3)
Chloride: 109 mmol/L (ref 98–111)
Creatinine: 0.6 mg/dL (ref 0.44–1.00)
GFR, Estimated: >60 mL/min (ref >60)
Glucose, Bld: 164 mg/dL — ABNORMAL HIGH (ref 70–99)
Potassium: 4.1 mmol/L (ref 3.5–5.1)
Sodium: 139 mmol/L (ref 135–145)
Total Bilirubin: 0.4 mg/dL (ref 0.3–1.2)
Total Protein: 6.4 g/dL — ABNORMAL LOW (ref 6.5–8.1)

## 2023-03-21 MED ORDER — HEPARIN SOD (PORK) LOCK FLUSH 100 UNIT/ML IV SOLN: 500.0000 [IU] | Freq: Once | INTRAVENOUS | Status: DC | PRN

## 2023-03-21 MED ORDER — SODIUM CHLORIDE 0.9 % IV SOLN
2000.0000 mg/m2 | INTRAVENOUS | Status: DC
  Administered 2023-03-21: 3500 mg via INTRAVENOUS
  Filled 2023-03-21: qty 70

## 2023-03-21 MED ORDER — LEUCOVORIN CALCIUM INJECTION 350 MG
400.0000 mg/m2 | Freq: Once | INTRAVENOUS | Status: AC
  Administered 2023-03-21: 672 mg via INTRAVENOUS
  Filled 2023-03-21: qty 33.6

## 2023-03-21 MED ORDER — OXALIPLATIN CHEMO INJECTION 100 MG/20ML
60.0000 mg/m2 | Freq: Once | INTRAVENOUS | Status: AC
  Administered 2023-03-21: 100 mg via INTRAVENOUS
  Filled 2023-03-21: qty 20

## 2023-03-21 MED ORDER — SODIUM CHLORIDE 0.9 % IV SOLN
10.0000 mg | Freq: Once | INTRAVENOUS | Status: AC
  Administered 2023-03-21: 10 mg via INTRAVENOUS
  Filled 2023-03-21: qty 10

## 2023-03-21 MED ORDER — DEXTROSE 5 % IV SOLN: Freq: Once | INTRAVENOUS | Status: AC

## 2023-03-21 MED ORDER — SODIUM CHLORIDE 0.9% FLUSH
10.0000 mL | Freq: Once | INTRAVENOUS | Status: AC
  Administered 2023-03-21: 10 mL

## 2023-03-21 MED ORDER — PALONOSETRON HCL INJECTION 0.25 MG/5ML
0.2500 mg | Freq: Once | INTRAVENOUS | Status: AC
  Administered 2023-03-21: 0.25 mg via INTRAVENOUS
  Filled 2023-03-21: qty 5

## 2023-03-21 MED ORDER — SODIUM CHLORIDE 0.9% FLUSH: 10.0000 mL | INTRAVENOUS | Status: DC | PRN

## 2023-03-21 NOTE — Patient Instructions (Signed)
[facility]  Discharge Instructions: Thank you for choosing [facility] to provide your oncology and hematology care.   If you have a lab appointment with the Cancer Center, please go directly to the Cancer Center and check in at the registration area.   Wear comfortable clothing and clothing appropriate for easy access to any Portacath or PICC line.   We strive to give you quality time with your provider. You may need to reschedule your appointment if you arrive late (15 or more minutes).  Arriving late affects you and other patients whose appointments are after yours.  Also, if you miss three or more appointments without notifying the office, you may be dismissed from the clinic at the provider's discretion.      For prescription refill requests, have your pharmacy contact our office and allow 72 hours for refills to be completed.    Today you received the following chemotherapy and/or immunotherapy agents: Oxaliplatin and Fluorouracil        To help prevent nausea and vomiting after your treatment, we encourage you to take your nausea medication as directed.  BELOW ARE SYMPTOMS THAT SHOULD BE REPORTED IMMEDIATELY: *FEVER GREATER THAN 100.4 F (38 C) OR HIGHER *CHILLS OR SWEATING *NAUSEA AND VOMITING THAT IS NOT CONTROLLED WITH YOUR NAUSEA MEDICATION *UNUSUAL SHORTNESS OF BREATH *UNUSUAL BRUISING OR BLEEDING *URINARY PROBLEMS (pain or burning when urinating, or frequent urination) *BOWEL PROBLEMS (unusual diarrhea, constipation, pain near the anus) TENDERNESS IN MOUTH AND THROAT WITH OR WITHOUT PRESENCE OF ULCERS (sore throat, sores in mouth, or a toothache) UNUSUAL RASH, SWELLING OR PAIN  UNUSUAL VAGINAL DISCHARGE OR ITCHING   Items with * indicate a potential emergency and should be followed up as soon as possible or go to the Emergency Department if any problems should occur.  Please show the CHEMOTHERAPY ALERT CARD or  IMMUNOTHERAPY ALERT CARD at check-in to the Emergency Department and triage nurse.  Should you have questions after your visit or need to cancel or reschedule your appointment, please contact [facility]  Dept: [phone]  and follow the prompts.  Office hours are 8:00 a.m. to 4:30 p.m. Monday - Friday. Please note that voicemails left after 4:00 p.m. may not be returned until the following business day.  We are closed weekends and major holidays. You have access to a nurse at all times for urgent questions. Please call the main number to the clinic Dept: [phone] and follow the prompts.   For any non-urgent questions, you may also contact your provider using MyChart. We now offer e-Visits for anyone 18 and older to request care online for non-urgent symptoms. For details visit [email or web address].   Also download the MyChart app! Go to the app store, search "MyChart", open the app, select [facility], and log in with your MyChart username and password.   

## 2023-03-21 NOTE — Progress Notes (Signed)
[facility]   Telephone:([phone]) [phone] Fax:([phone]) [phone]   Clinic Follow up Note   Patient Care Team: Karle Plumber, MD as PCP - General (Internal Medicine) Maisie Fus, MD as PCP - Cardiology (Cardiology) Malachy Mood, MD as Consulting Physician (Oncology)  Date of Service:  03/21/2023  CHIEF COMPLAINT: f/u of pancreatic cancer   CURRENT THERAPY:  start of third line FOLFOX   ASSESSMENT:  Miranda Francis is a 77 y.o. female with   Pancreatic cancer (HCC) stage IV c(T2, N0, M1) with liver metastasis, MMR normal, Tempus (-) -Diagnosed in 01/2022. presented to PCP with jaundice for one month. Also complained of epigastric pain, poor appetite, weight loss, and light-colored stool.  -she began neoadjuvant gemcitabine alone on 02/16/22, abraxane was added with C2 (03/08/22).   -liver biopsy confirmed metastatic cancer  -Due to cancer progression, chemo changed to second line FOLFIRI on November 2023 -She unfortunately did not respond well to chemo, restaging scan from February 2024 showed disease progression in liver again -She was changed to third line chemotherapy FOLFOX on 01/03/23, she is overall tolerating well  -Her tumor marker CA 19.9 has increased significantly after first cycle FOLFOX, but it went down after 3 cycles chemo  -She had a significant fatigue, dehydration last cycle chemotherapy.  She recovered slowly.  I will reduce her chemo dose for this cycle and going forward.   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 She has diabetes also  -overall mild and stable, she mainly has numbness and tingling in her feet.    Pulmonary embolism (HCC) -Staging CT scan from December 25 2022 showed pulmonary embolism  in the left main pulmonary artery extending into the left upper and lower lobe branches. No right-sided pulmonary emboli. No findings for right heart strain.  -Patient was evaluated in the emergency room, no hypoxia or other  concerning signs, patient was discharged home with Eliquis loading dose for a week. -She is tolerating well, will continue indefinitely     PLAN: -lab reviewed, adequate for treatment. --proceed with C4 FOLFOX today with dose reduction due to increased toxicity -Encourage exercise -order CT scan in 4 weeks -Follow-up in 2 weeks before next cycle chemo  SUMMARY OF ONCOLOGIC HISTORY: Oncology History Overview Note   Cancer Staging  Pancreatic cancer [facility]) Staging form: Exocrine Pancreas, AJCC 8th Edition - Clinical stage from 01/18/2022: Stage IB (cT2, cN0, cM0) - Signed by Malachy Mood, MD on 01/28/2022    Pancreatic cancer (HCC)  01/16/2022 Imaging   CLINICAL DATA:  Jaundice   EXAM: CT ABDOMEN AND PELVIS WITH CONTRAST  IMPRESSION: 1. Marked intra and extrahepatic biliary dilatation with marked dilatation of pancreatic duct and distended gallbladder. There is significant atrophy of the body and tail of pancreas. 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 2. Diverticular disease of the colon without acute wall thickening 3. Mild endometrial thickening up to 9 mm, recommend nonemergent pelvic ultrasound for further evaluation   01/17/2022 Imaging   EXAM: MRI ABDOMEN WITHOUT AND WITH CONTRAST (INCLUDING MRCP)  IMPRESSION: 1. Obstructing mass within the pancreatic head with an intraluminal component demonstrating low level enhancement. 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 2. This mass causes significant extrinsic narrowing at the portal/superior mesenteric vein  confluence as well as marked intra and extrahepatic biliary dilatation. 3. No distant metastases identified. 4.  Endoscopy for endoscopic ultrasound, tissue sampling and biliary decompression recommended.   01/18/2022 Cancer Staging   Staging form: Exocrine Pancreas, AJCC 8th Edition - Clinical stage from 01/18/2022: Stage IB (cT2, cN0, cM0) - Signed by Malachy Mood, MD on 01/28/2022 Stage prefix: Initial diagnosis Total positive nodes: 0   01/18/2022 Initial Biopsy   FINAL MICROSCOPIC DIAGNOSIS:  A. PANCREAS, HEAD, FINE NEEDLE ASPIRATION:  - Malignant cells present consistent with adenocarcinoma   01/24/2022 Imaging   EXAM: CT CHEST WITHOUT CONTRAST  IMPRESSION: 1. No evidence of metastatic disease in the chest. 2. Atelectasis or consolidation in the right lower lung. 3. Healing right rib fractures. 4. Mass in the head of the pancreas. See previous report of CT abdomen and pelvis 01/16/2022. Interval placement of the biliary stent.   01/28/2022 Initial Diagnosis   Pancreatic cancer (HCC)   02/16/2022 - 06/28/2022 Chemotherapy   Patient is on Treatment Plan : PANCREATIC Abraxane / Gemcitabine D1,8 q21d     02/16/2022 Genetic Testing   Negative genetic testing on the CancerNext-Expanded+RNAinsight panel.  The report date is February 16, 2022.  The CancerNext-Expanded gene panel offered by [facility] and includes sequencing and rearrangement analysis for the following 77 genes: AIP, ALK, APC*, ATM*, AXIN2, BAP1, BARD1, BLM, BMPR1A, BRCA1*, BRCA2*, BRIP1*, CDC73, CDH1*, CDK4, CDKN1B, CDKN2A, CHEK2*, CTNNA1, DICER1, FANCC, FH, FLCN, GALNT12, KIF1B, LZTR1, MAX, MEN1, MET, MLH1*, MSH2*, MSH3, MSH6*, MUTYH*, NBN, NF1*, NF2, NTHL1, PALB2*, PHOX2B, PMS2*, POT1, PRKAR1A, PTCH1, PTEN*, RAD51C*, RAD51D*, RB1, RECQL, RET, SDHA, SDHAF2, SDHB, SDHC, SDHD, SMAD4, SMARCA4, SMARCB1, SMARCE1, STK11, SUFU, TMEM127, TP53*, TSC1, TSC2, VHL and XRCC2 (sequencing and deletion/duplication); EGFR, EGLN1, HOXB13, KIT, MITF, PDGFRA, POLD1, and POLE (sequencing only); EPCAM and GREM1 (deletion/duplication only). DNA and RNA analyses  performed for * genes.    06/14/2022 - 09/19/2022 Chemotherapy   Patient is on Treatment Plan : PANCREATIC Abraxane D1,8,15 + Gemcitabine D1,8,15 q28d     10/11/2022 - 12/08/2022 Chemotherapy   Patient is on Treatment Plan : PANCREAS Liposomal Irinotecan + Leucovorin + 5-FU IVCI q14d     12/25/2022 Imaging    IMPRESSION: 1. Left-sided pulmonary emboli. No findings for right heart strain. 2. Stable small left upper lobe pulmonary nodules. No new or progressive findings in the chest. 3. Overall stable appearing infiltrating pancreatic neoplasm. 4. New and interval enlargement of hepatic metastatic disease. 5. Biliary stent in place with associated pneumobilia and gas in the gallbladder. Mild to moderate biliary dilatation. 6. 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 Extensive portal venous collateral vessels.     01/03/2023 -  Chemotherapy   Patient is on Treatment Plan : PANCREAS FOLFOX q14d      Pathology Results      Pathology Results      Miscellaneous         INTERVAL HISTORY:  Zorah Bussinger is here for a follow up of pancreatic cancer. She was last seen by me on 03/07/2023. She presents to the clinic accompanied by husband. She had a significant fatigue and low appetite after last cycle chemotherapy, was found to be hypotensive on day of pump DC and required IV fluids.  She has recovered slowly.  No significant nausea, diarrhea, appetite has picked up this week, she is eating better.  All other systems were reviewed with the patient and are negative.  MEDICAL HISTORY:  Past Medical History:  Diagnosis Date  Blood transfusion    Diabetes mellitus    Family history of breast cancer    Family history of colon cancer    Family history of prostate cancer    Heart murmur    Hypertension    pancreatic ca 12/2021    SURGICAL HISTORY: Past Surgical History:  Procedure Laterality Date   BILIARY STENT PLACEMENT  01/18/2022   Procedure: BILIARY  STENT PLACEMENT;  Surgeon: Jeani Hawking, MD;  Location: [facility] ENDOSCOPY;  Service: Gastroenterology;;   DILATION AND CURETTAGE OF UTERUS     ERCP N/A 01/18/2022   Procedure: ENDOSCOPIC RETROGRADE CHOLANGIOPANCREATOGRAPHY (ERCP);  Surgeon: Jeani Hawking, MD;  Location: [facility] ENDOSCOPY;  Service: Gastroenterology;  Laterality: N/A;   ESOPHAGOGASTRODUODENOSCOPY N/A 01/18/2022   Procedure: ESOPHAGOGASTRODUODENOSCOPY (EGD);  Surgeon: Jeani Hawking, MD;  Location: [facility] ENDOSCOPY;  Service: Gastroenterology;  Laterality: N/A;   EUS  01/18/2022   Procedure: UPPER ENDOSCOPIC ULTRASOUND (EUS) LINEAR;  Surgeon: Jeani Hawking, MD;  Location: [facility] ENDOSCOPY;  Service: Gastroenterology;;   FINE NEEDLE ASPIRATION  01/18/2022   Procedure: FINE NEEDLE ASPIRATION (FNA) LINEAR;  Surgeon: Jeani Hawking, MD;  Location: [facility] ENDOSCOPY;  Service: Gastroenterology;;   IR IMAGING GUIDED PORT INSERTION  02/15/2022   NO PAST SURGERIES     SPHINCTEROTOMY  01/18/2022   Procedure: Dennison Mascot;  Surgeon: Jeani Hawking, MD;  Location: [facility] ENDOSCOPY;  Service: Gastroenterology;;    I have reviewed the social history and family history with the patient and they are unchanged from previous note.  ALLERGIES:  is allergic to irinotecan liposome.  MEDICATIONS:  Current Outpatient Medications  Medication Sig Dispense Refill   acetaminophen (TYLENOL) 500 MG tablet Take 1,000 mg by mouth every 6 (six) hours as needed for moderate pain or headache.     amLODipine (NORVASC) 5 MG tablet TAKE 1 TABLET (5 MG TOTAL) BY MOUTH DAILY. 90 tablet 1   apixaban (ELIQUIS) 5 MG TABS tablet Take 1 tablet (5 mg total) by mouth 2 (two) times daily. 60 tablet 2   APIXABAN (ELIQUIS) VTE STARTER PACK (10MG  AND 5MG ) Take as directed on package: start with two-5mg  tablets twice daily for 7 days. On day 8, switch to one-5mg  tablet twice daily. 74 each 0   Cholecalciferol (QC VITAMIN D3) 50 MCG (2000 UT) TABS Take 2,000 Units by mouth daily.     Continuous Blood Gluc Sensor  (FREESTYLE LIBRE 2 SENSOR) MISC USE 1 SENSOR AND CHANGE EVERY 14 DAYS AS DIRECTED.     dexamethasone (DECADRON) 4 MG tablet Take 2 tablets (8 mg total) by mouth daily. Start the day after chemotherapy for 2 days. Take with food. 30 tablet 1   diphenoxylate-atropine (LOMOTIL) 2.5-0.025 MG tablet Take 1 tablet by mouth 4 (four) times daily as needed for diarrhea or loose stools. 30 tablet 0   gabapentin (NEURONTIN) 100 MG capsule Take 1-2 capsules (100-200 mg total) by mouth 2 (two) times daily. 90 capsule 1   KLOR-CON M20 20 MEQ tablet Take 20 mEq by mouth 2 (two) times daily.     lidocaine-prilocaine (EMLA) cream Apply to affected area once 30 g 3   lidocaine-prilocaine (EMLA) cream Apply 1 application topically as needed. 30 g 1   losartan (COZAAR) 100 MG tablet Take 100 mg by mouth daily.     NOVOLOG FLEXPEN 100 UNIT/ML FlexPen Inject 15 Units into the skin 2 (two) times daily.     ondansetron (ZOFRAN) 8 MG tablet Take 1 tablet (8 mg total) by mouth every 8 (eight) hours as needed for  nausea or vomiting. Start on the third day after chemotherapy. 30 tablet 1   oxyCODONE (OXY IR/ROXICODONE) 5 MG immediate release tablet Take 1 tablet (5 mg total) by mouth every 3 (three) hours as needed for severe pain. 20 tablet 0   polyethylene glycol (MIRALAX / GLYCOLAX) 17 g packet Take 17 g by mouth daily. 14 each 0   prochlorperazine (COMPAZINE) 10 MG tablet Take 1 tablet (10 mg total) by mouth every 6 (six) hours as needed for nausea or vomiting. 30 tablet 1   zolpidem (AMBIEN) 10 MG tablet Take 10 mg by mouth at bedtime as needed.     No current facility-administered medications for this visit.   Facility-Administered Medications Ordered in Other Visits  Medication Dose Route Frequency Provider Last Rate Last Admin   fluorouracil (ADRUCIL) 3,500 mg in sodium chloride 0.9 % 80 mL chemo infusion  2,000 mg/m2 (Treatment Plan Recorded) Intravenous 1 day or 1 dose Malachy Mood, MD       heparin lock flush 100  unit/mL  500 Units Intracatheter Once PRN Malachy Mood, MD       leucovorin 672 mg in dextrose 5 % 250 mL infusion  400 mg/m2 (Treatment Plan Recorded) Intravenous Once Malachy Mood, MD       oxaliplatin (ELOXATIN) 100 mg in dextrose 5 % 500 mL chemo infusion  60 mg/m2 (Treatment Plan Recorded) Intravenous Once Malachy Mood, MD       sodium chloride flush (NS) 0.9 % injection 10 mL  10 mL Intracatheter PRN Malachy Mood, MD        PHYSICAL EXAMINATION: ECOG PERFORMANCE STATUS: 1 - Symptomatic but completely ambulatory  Vitals:   03/21/23 0933  BP: (!) 156/84  Pulse: (!) 55  Resp: 16  Temp: 98.2 F (36.8 C)  SpO2: 100%    Wt Readings from Last 3 Encounters:  03/21/23 142 lb 4.8 oz (64.5 kg)  03/07/23 142 lb 11.2 oz (64.7 kg)  02/14/23 143 lb 14.4 oz (65.3 kg)     GENERAL:alert, no distress and comfortable SKIN: skin color normal, no rashes or significant lesions EYES: normal, Conjunctiva are pink and non-injected, sclera clear  NEURO: alert & oriented x 3 with fluent speech  LABORATORY DATA:  I have reviewed the data as listed    Latest Ref Rng & Units 03/21/2023    8:12 AM 03/07/2023    8:44 AM 02/14/2023    8:35 AM  CBC  WBC 4.0 - 10.5 K/uL 4.4  4.3  5.3   Hemoglobin 12.0 - 15.0 g/dL 9.8  16.1  09.6   Hematocrit 36.0 - 46.0 % 29.5  30.1  28.7   Platelets 150 - 400 K/uL 105  134  110         Latest Ref Rng & Units 03/21/2023    8:12 AM 03/07/2023    8:44 AM 02/14/2023    8:35 AM  CMP  Glucose 70 - 99 mg/dL 045  409  811   BUN 8 - 23 mg/dL 12  13  12    Creatinine 0.44 - 1.00 mg/dL 9.14  7.82  9.56   Sodium 135 - 145 mmol/L 139  140  138   Potassium 3.5 - 5.1 mmol/L 4.1  3.8  4.0   Chloride 98 - 111 mmol/L 109  110  107   CO2 22 - 32 mmol/L 24  25  24    Calcium 8.9 - 10.3 mg/dL 8.9  9.2  9.3   Total Protein 6.5 - 8.1  g/dL 6.4  6.4  6.5   Total Bilirubin 0.3 - 1.2 mg/dL 0.4  0.4  0.5   Alkaline Phos 38 - 126 U/L 233  223  230   AST 15 - 41 U/L 23  28  20    ALT 0 - 44 U/L 16  17   16        RADIOGRAPHIC STUDIES: I have personally reviewed the radiological images as listed and agreed with the findings in the report. No results found.    No orders of the defined types were placed in this encounter.  All questions were answered. The patient knows to call the clinic with any problems, questions or concerns. No barriers to learning was detected. The total time spent in the appointment was 25 minutes.     Malachy Mood, MD 03/21/2023

## 2023-03-22 ENCOUNTER — Telehealth: Payer: Self-pay | Admitting: Hematology

## 2023-03-22 NOTE — Telephone Encounter (Signed)
Contacted patient to scheduled appointments. Patient is aware of appointments that are scheduled.   

## 2023-03-23 ENCOUNTER — Inpatient Hospital Stay: Payer: Medicare HMO

## 2023-03-23 VITALS — BP 137/74 | HR 70 | Temp 97.8°F | Resp 18

## 2023-03-23 DIAGNOSIS — Z5111 Encounter for antineoplastic chemotherapy: Secondary | ICD-10-CM | POA: Diagnosis not present

## 2023-03-23 DIAGNOSIS — C250 Malignant neoplasm of head of pancreas: Secondary | ICD-10-CM

## 2023-03-23 MED ORDER — HEPARIN SOD (PORK) LOCK FLUSH 100 UNIT/ML IV SOLN
500.0000 [IU] | Freq: Once | INTRAVENOUS | Status: AC | PRN
  Administered 2023-03-23: 500 [IU]

## 2023-03-23 MED ORDER — SODIUM CHLORIDE 0.9 % IV SOLN: INTRAVENOUS | Status: AC

## 2023-03-29 ENCOUNTER — Telehealth: Payer: Self-pay | Admitting: Hematology

## 2023-03-29 NOTE — Telephone Encounter (Signed)
Contacted patient to scheduled appointments. Patient is aware of appointments that are scheduled.   

## 2023-03-30 ENCOUNTER — Other Ambulatory Visit: Payer: Self-pay

## 2023-04-02 ENCOUNTER — Ambulatory Visit (HOSPITAL_COMMUNITY)
Admission: RE | Admit: 2023-04-02 | Discharge: 2023-04-02 | Disposition: A | Discharge status: Home / Self Care | Payer: Medicare HMO | Source: Ambulatory Visit | Attending: Hematology | Admitting: Hematology

## 2023-04-02 DIAGNOSIS — C250 Malignant neoplasm of head of pancreas: Secondary | ICD-10-CM | POA: Diagnosis present

## 2023-04-02 MED ORDER — IOHEXOL 300 MG/ML  SOLN
100.0000 mL | Freq: Once | INTRAMUSCULAR | Status: AC | PRN
  Administered 2023-04-02: 100 mL via INTRAVENOUS

## 2023-04-04 ENCOUNTER — Inpatient Hospital Stay: Payer: Medicare HMO | Admitting: Hematology

## 2023-04-04 ENCOUNTER — Inpatient Hospital Stay: Payer: Medicare HMO

## 2023-04-04 ENCOUNTER — Other Ambulatory Visit (HOSPITAL_BASED_OUTPATIENT_CLINIC_OR_DEPARTMENT_OTHER): Payer: Self-pay

## 2023-04-06 ENCOUNTER — Inpatient Hospital Stay: Payer: Medicare HMO

## 2023-04-17 MED FILL — Dexamethasone Sodium Phosphate Inj 100 MG/10ML: INTRAMUSCULAR | Qty: 1 | Status: AC

## 2023-04-18 ENCOUNTER — Inpatient Hospital Stay: Payer: Medicare HMO

## 2023-04-18 ENCOUNTER — Other Ambulatory Visit: Payer: Self-pay

## 2023-04-18 ENCOUNTER — Inpatient Hospital Stay: Payer: Medicare HMO | Attending: Physician Assistant | Admitting: Hematology

## 2023-04-18 ENCOUNTER — Encounter: Payer: Self-pay | Admitting: Hematology

## 2023-04-18 VITALS — BP 141/90 | HR 87 | Temp 98.5°F | Resp 15 | Ht 62.0 in | Wt 142.0 lb

## 2023-04-18 DIAGNOSIS — C250 Malignant neoplasm of head of pancreas: Secondary | ICD-10-CM | POA: Diagnosis present

## 2023-04-18 DIAGNOSIS — I10 Essential (primary) hypertension: Secondary | ICD-10-CM | POA: Diagnosis not present

## 2023-04-18 DIAGNOSIS — T451X5A Adverse effect of antineoplastic and immunosuppressive drugs, initial encounter: Secondary | ICD-10-CM

## 2023-04-18 DIAGNOSIS — G620 Drug-induced polyneuropathy: Secondary | ICD-10-CM | POA: Diagnosis not present

## 2023-04-18 DIAGNOSIS — Z79899 Other long term (current) drug therapy: Secondary | ICD-10-CM | POA: Insufficient documentation

## 2023-04-18 DIAGNOSIS — E1142 Type 2 diabetes mellitus with diabetic polyneuropathy: Secondary | ICD-10-CM | POA: Insufficient documentation

## 2023-04-18 DIAGNOSIS — I2693 Single subsegmental pulmonary embolism without acute cor pulmonale: Secondary | ICD-10-CM

## 2023-04-18 DIAGNOSIS — C787 Secondary malignant neoplasm of liver and intrahepatic bile duct: Secondary | ICD-10-CM | POA: Diagnosis present

## 2023-04-18 LAB — CBC WITH DIFFERENTIAL (CANCER CENTER ONLY)
Abs Immature Granulocytes: 0.01 10*3/uL (ref 0.00–0.07)
Basophils Absolute: 0 10*3/uL (ref 0.0–0.1)
Basophils Relative: 1 %
Eosinophils Absolute: 0.1 10*3/uL (ref 0.0–0.5)
Eosinophils Relative: 1 %
HCT: 29.9 % — ABNORMAL LOW (ref 36.0–46.0)
Hemoglobin: 9.9 g/dL — ABNORMAL LOW (ref 12.0–15.0)
Immature Granulocytes: 0 %
Lymphocytes Relative: 27 %
Lymphs Abs: 1.5 10*3/uL (ref 0.7–4.0)
MCH: 30.7 pg (ref 26.0–34.0)
MCHC: 33.1 g/dL (ref 30.0–36.0)
MCV: 92.9 fL (ref 80.0–100.0)
Monocytes Absolute: 0.4 10*3/uL (ref 0.1–1.0)
Monocytes Relative: 8 %
Neutro Abs: 3.5 10*3/uL (ref 1.7–7.7)
Neutrophils Relative %: 63 %
Platelet Count: 143 10*3/uL — ABNORMAL LOW (ref 150–400)
RBC: 3.22 MIL/uL — ABNORMAL LOW (ref 3.87–5.11)
RDW: 17.5 % — ABNORMAL HIGH (ref 11.5–15.5)
WBC Count: 5.5 10*3/uL (ref 4.0–10.5)
nRBC: 0 % (ref 0.0–0.2)

## 2023-04-18 LAB — CMP (CANCER CENTER ONLY)
ALT: 11 U/L (ref 0–44)
AST: 22 U/L (ref 15–41)
Albumin: 3.5 g/dL (ref 3.5–5.0)
Alkaline Phosphatase: 232 U/L — ABNORMAL HIGH (ref 38–126)
Anion gap: 7 (ref 5–15)
BUN: 11 mg/dL (ref 8–23)
CO2: 23 mmol/L (ref 22–32)
Calcium: 9.1 mg/dL (ref 8.9–10.3)
Chloride: 109 mmol/L (ref 98–111)
Creatinine: 0.62 mg/dL (ref 0.44–1.00)
GFR, Estimated: >60 mL/min (ref >60)
Glucose, Bld: 124 mg/dL — ABNORMAL HIGH (ref 70–99)
Potassium: 3.8 mmol/L (ref 3.5–5.1)
Sodium: 139 mmol/L (ref 135–145)
Total Bilirubin: 0.6 mg/dL (ref 0.3–1.2)
Total Protein: 6.6 g/dL (ref 6.5–8.1)

## 2023-04-18 NOTE — Assessment & Plan Note (Signed)
-  secondary to Abraxane. She has diabetes also  -overall mild and stable, she mainly has numbness and tingling in her feet.   

## 2023-04-18 NOTE — Progress Notes (Signed)
[facility]   [phone] [phone]   Clinic Follow up Note   Patient Care Team: Karle Plumber, MD as PCP - General (Internal Medicine) Maisie Fus, MD as PCP - Cardiology (Cardiology) Malachy Mood, MD as Consulting Physician (Oncology)  Date of Service:  04/18/2023  CHIEF COMPLAINT: f/u of pancreatic cancer   CURRENT THERAPY:  start of third line FOLFOX   ASSESSMENT:  Miranda Francis is a 77 y.o. female with   Pancreatic cancer (HCC) stage IV c(T2, N0, M1) with liver metastasis, MMR normal, Tempus (-) -Diagnosed in 01/2022. presented to PCP with jaundice for one month. Also complained of epigastric pain, poor appetite, weight loss, and light-colored stool.  -she began neoadjuvant gemcitabine alone on 02/16/22, abraxane was added with C2 (03/08/22).   -liver biopsy confirmed metastatic cancer  -Due to cancer progression, chemo changed to second line FOLFIRI on November 2023 -She unfortunately did not respond well to chemo, restaging scan from February 2024 showed disease progression in liver again -She was changed to third line chemotherapy FOLFOX on 01/03/23, she is overall tolerating well  -Her tumor marker CA 19.9 has increased significantly after first cycle FOLFOX, but it went down after 3 cycles chemo  -She had significant fatigue and dehydration after cycle 4 chemotherapy.  She recovered slowly.  I reduced her chemo dose from cycle 5. She cancelled her C6 chemo  -Unfortunately her restaging CT scan from Apr 02, 2023 showed cancer progression in pancreas in the liver.  I personally reviewed her scan images and discussed findings with patient -Due to her poor response to chemotherapy, and worsening performance status, I do not recommend further chemotherapy.  She unfortunately is not a candidate for immunotherapy or targeted therapy. -We discussed palliative care and hospice, the benefit and the logistics are reviewed with patient and her husband  in detail today.  She declined.  She is rather to follow-up with [place] in the clinic.  Pulmonary embolism (HCC) -Staging CT scan from December 25 2022 showed pulmonary embolism  in the left main pulmonary artery extending into the left upper and lower lobe branches. No right-sided pulmonary emboli. No findings for right heart strain.  -Patient was evaluated in the emergency room, no hypoxia or other concerning signs, patient was discharged home with Eliquis loading dose for a week. 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 She has diabetes also  -overall mild and stable, she mainly has numbness and tingling in her feet.   Abdominal pain, fatigue, anorexia -Related to her underlying cancer and recent chemo treatment -We discussed symptom management.  I encouraged her to increase oxycodone to twice a day, and adjust as needed, she agrees -She previously tried mirtazapine and Marinol, did not tolerate.  We discussed option of Megace for her appetite, she declined. -I encouraged her to eat high-protein and high-calorie food, and stay active if she can. -Will refer to social worker for counseling and support.   PLAN: -lab reviewed -Discuss CT scan-progression -recommend to discontinue chemotherapy due to her poor response to treatment  -discuss Palliative/hospice care, she declined  - pt wants to continue to come in office  -lab/flush and f/u in 1 month, will review code status on next visit    SUMMARY OF ONCOLOGIC HISTORY: Oncology History Overview Note   Cancer Staging  Pancreatic cancer [facility]) Staging form: Exocrine Pancreas, AJCC 8th Edition - Clinical stage from 01/18/2022:  Stage IB (cT2, cN0, cM0) - Signed by Malachy Mood, MD on 01/28/2022    Pancreatic cancer (HCC)  01/16/2022 Imaging   CLINICAL DATA:  Jaundice   EXAM: CT ABDOMEN AND PELVIS WITH CONTRAST  IMPRESSION: 1. Marked intra and  extrahepatic biliary dilatation with marked dilatation of pancreatic duct and distended gallbladder. There is significant atrophy of the body and tail of pancreas. 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 2. Diverticular disease of the colon without acute wall thickening 3. Mild endometrial thickening up to 9 mm, recommend nonemergent pelvic ultrasound for further evaluation   01/17/2022 Imaging   EXAM: MRI ABDOMEN WITHOUT AND WITH CONTRAST (INCLUDING MRCP)  IMPRESSION: 1. Obstructing mass within the pancreatic head with an intraluminal component demonstrating low level enhancement. 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 2. This mass causes significant extrinsic narrowing at the portal/superior mesenteric vein confluence as well as marked intra and extrahepatic biliary dilatation. 3. No distant metastases identified. 4. Endoscopy for endoscopic ultrasound, tissue sampling and biliary decompression recommended.   01/18/2022 Cancer Staging   Staging form: Exocrine Pancreas, AJCC 8th Edition - Clinical stage from 01/18/2022: Stage IB (cT2, cN0, cM0) - Signed by Malachy Mood, MD on 01/28/2022 Stage prefix: Initial diagnosis Total positive nodes: 0   01/18/2022 Initial Biopsy   FINAL MICROSCOPIC DIAGNOSIS:  A. PANCREAS, HEAD, FINE NEEDLE ASPIRATION:  - Malignant cells present consistent with adenocarcinoma   01/24/2022 Imaging   EXAM: CT CHEST WITHOUT CONTRAST  IMPRESSION: 1. No evidence of metastatic disease in the chest. 2. Atelectasis or consolidation in the right lower lung. 3. Healing right rib fractures. 4. Mass in the head of the pancreas. See previous report of CT abdomen  and pelvis 01/16/2022. Interval placement of the biliary stent.   01/28/2022 Initial Diagnosis   Pancreatic cancer (HCC)   02/16/2022 - 06/28/2022 Chemotherapy   Patient is on Treatment Plan : PANCREATIC Abraxane / Gemcitabine D1,8 q21d     02/16/2022 Genetic Testing   Negative genetic testing on the CancerNext-Expanded+RNAinsight panel.  The report date is February 16, 2022.  The CancerNext-Expanded gene panel offered by [facility] and includes sequencing and rearrangement analysis for the following 77 genes: AIP, ALK, APC*, ATM*, AXIN2, BAP1, BARD1, BLM, BMPR1A, BRCA1*, BRCA2*, BRIP1*, CDC73, CDH1*, CDK4, CDKN1B, CDKN2A, CHEK2*, CTNNA1, DICER1, FANCC, FH, FLCN, GALNT12, KIF1B, LZTR1, MAX, MEN1, MET, MLH1*, MSH2*, MSH3, MSH6*, MUTYH*, NBN, NF1*, NF2, NTHL1, PALB2*, PHOX2B, PMS2*, POT1, PRKAR1A, PTCH1, PTEN*, RAD51C*, RAD51D*, RB1, RECQL, RET, SDHA, SDHAF2, SDHB, SDHC, SDHD, SMAD4, SMARCA4, SMARCB1, SMARCE1, STK11, SUFU, TMEM127, TP53*, TSC1, TSC2, VHL and XRCC2 (sequencing and deletion/duplication); EGFR, EGLN1, HOXB13, KIT, MITF, PDGFRA, POLD1, and POLE (sequencing only); EPCAM and GREM1 (deletion/duplication only). DNA and RNA analyses performed for * genes.    06/14/2022 - 09/19/2022 Chemotherapy   Patient is on Treatment Plan : PANCREATIC Abraxane D1,8,15 + Gemcitabine D1,8,15 q28d     10/11/2022 - 12/08/2022 Chemotherapy   Patient is on Treatment Plan : PANCREAS Liposomal Irinotecan + Leucovorin + 5-FU IVCI q14d     12/25/2022 Imaging    IMPRESSION: 1. Left-sided pulmonary emboli. No findings for right heart strain. 2. Stable small left upper lobe pulmonary nodules. No new or progressive findings in the chest. 3. Overall stable appearing infiltrating pancreatic  neoplasm. 4. New and interval enlargement of hepatic metastatic disease. 5. Biliary stent in place with associated pneumobilia and gas in the gallbladder. Mild to moderate biliary dilatation. 6. 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 Extensive portal venous collateral vessels.     01/03/2023 -  Chemotherapy   Patient is on Treatment Plan : PANCREAS FOLFOX q14d      Pathology Results      Pathology Results      Miscellaneous         INTERVAL HISTORY: Miranda Francis is here for a follow up of pancreatic cancer. She was last seen by me on 03/21/2023. She presents to the clinic accompanied by husband. Pt state that she has no energy and she is unable to eat. Pt state that she feels bloated and gassy. When she does eat she can eat some beef, like chili and a little steak, but fish and chicken doesn't do well with her. Pt state that she is able to cook and take care of herself, but she is in a lot of pain and she doesn't sleep well at night.    All other systems were reviewed with the patient and are negative.  MEDICAL HISTORY:  Past Medical History:  Diagnosis Date   Blood transfusion    Diabetes mellitus    Family history of breast cancer    Family history of colon cancer    Family history of prostate cancer    Heart murmur    Hypertension    pancreatic ca 12/2021    SURGICAL HISTORY: Past Surgical History:  Procedure Laterality Date   BILIARY STENT PLACEMENT  01/18/2022   Procedure: BILIARY STENT PLACEMENT;  Surgeon: Jeani Hawking, MD;  Location: [facility] ENDOSCOPY;  Service: Gastroenterology;;   DILATION AND CURETTAGE OF UTERUS     ERCP N/A 01/18/2022   Procedure: ENDOSCOPIC RETROGRADE CHOLANGIOPANCREATOGRAPHY (ERCP);  Surgeon: Jeani Hawking, MD;  Location: [facility] ENDOSCOPY;  Service: Gastroenterology;  Laterality: N/A;   ESOPHAGOGASTRODUODENOSCOPY N/A 01/18/2022   Procedure: ESOPHAGOGASTRODUODENOSCOPY (EGD);  Surgeon: Jeani Hawking, MD;  Location: [facility] ENDOSCOPY;  Service: Gastroenterology;  Laterality: N/A;   EUS  01/18/2022   Procedure: UPPER ENDOSCOPIC ULTRASOUND (EUS) LINEAR;  Surgeon: Jeani Hawking, MD;  Location: [facility] ENDOSCOPY;  Service: Gastroenterology;;   FINE NEEDLE ASPIRATION   01/18/2022   Procedure: FINE NEEDLE ASPIRATION (FNA) LINEAR;  Surgeon: Jeani Hawking, MD;  Location: [facility] ENDOSCOPY;  Service: Gastroenterology;;   IR IMAGING GUIDED PORT INSERTION  02/15/2022   NO PAST SURGERIES     SPHINCTEROTOMY  01/18/2022   Procedure: Dennison Mascot;  Surgeon: Jeani Hawking, MD;  Location: [facility] ENDOSCOPY;  Service: Gastroenterology;;    I have reviewed the social history and family history with the patient and they are unchanged from previous note.  ALLERGIES:  is allergic to irinotecan liposome.  MEDICATIONS:  Current Outpatient Medications  Medication Sig Dispense Refill   acetaminophen (TYLENOL) 500 MG tablet Take 1,000 mg by mouth every 6 (six) hours as needed for moderate pain or headache.     amLODipine (NORVASC) 5 MG tablet TAKE 1 TABLET (5 MG TOTAL) BY MOUTH DAILY. 90 tablet 1   apixaban (ELIQUIS) 5 MG TABS tablet Take 1 tablet (5 mg total) by mouth 2 (two) times daily. 60 tablet 2   APIXABAN (ELIQUIS) VTE STARTER PACK (10MG  AND 5MG ) Take as directed on package: start with two-5mg  tablets twice daily for 7 days. On day 8, switch to one-5mg  tablet twice daily. 74 each 0  Cholecalciferol (QC VITAMIN D3) 50 MCG (2000 UT) TABS Take 2,000 Units by mouth daily.     Continuous Blood Gluc Sensor (FREESTYLE LIBRE 2 SENSOR) MISC USE 1 SENSOR AND CHANGE EVERY 14 DAYS AS DIRECTED.     dexamethasone (DECADRON) 4 MG tablet Take 2 tablets (8 mg total) by mouth daily. Start the day after chemotherapy for 2 days. Take with food. 30 tablet 1   diphenoxylate-atropine (LOMOTIL) 2.5-0.025 MG tablet Take 1 tablet by mouth 4 (four) times daily as needed for diarrhea or loose stools. 30 tablet 0   gabapentin (NEURONTIN) 100 MG capsule Take 1-2 capsules (100-200 mg total) by mouth 2 (two) times daily. 90 capsule 1   KLOR-CON M20 20 MEQ tablet Take 20 mEq by mouth 2 (two) times daily.     lidocaine-prilocaine (EMLA) cream Apply to affected area once 30 g 3   lidocaine-prilocaine (EMLA) cream  Apply 1 application topically as needed. 30 g 1   losartan (COZAAR) 100 MG tablet Take 100 mg by mouth daily.     NOVOLOG FLEXPEN 100 UNIT/ML FlexPen Inject 15 Units into the skin 2 (two) times daily.     ondansetron (ZOFRAN) 8 MG tablet Take 1 tablet (8 mg total) by mouth every 8 (eight) hours as needed for nausea or vomiting. Start on the third day after chemotherapy. 30 tablet 1   oxyCODONE (OXY IR/ROXICODONE) 5 MG immediate release tablet Take 1 tablet (5 mg total) by mouth every 3 (three) hours as needed for severe pain. 20 tablet 0   polyethylene glycol (MIRALAX / GLYCOLAX) 17 g packet Take 17 g by mouth daily. 14 each 0   prochlorperazine (COMPAZINE) 10 MG tablet Take 1 tablet (10 mg total) by mouth every 6 (six) hours as needed for nausea or vomiting. 30 tablet 1   zolpidem (AMBIEN) 10 MG tablet Take 10 mg by mouth at bedtime as needed.     No current facility-administered medications for this visit.    PHYSICAL EXAMINATION: ECOG PERFORMANCE STATUS: 2 - Symptomatic, <50% confined to bed  Vitals:   04/18/23 0914  BP: (!) 141/90  Pulse: 87  Resp: 15  Temp: 98.5 F (36.9 C)  SpO2: 99%   Wt Readings from Last 3 Encounters:  04/18/23 142 lb (64.4 kg)  03/21/23 142 lb 4.8 oz (64.5 kg)  03/07/23 142 lb 11.2 oz (64.7 kg)     GENERAL:alert, no distress and comfortable SKIN: skin color normal, no rashes or significant lesions EYES: normal, Conjunctiva are pink and non-injected, sclera clear  NEURO: alert & oriented x 3 with fluent speech LABORATORY DATA:  I have reviewed the data as listed    Latest Ref Rng & Units 04/18/2023    8:32 AM 03/21/2023    8:12 AM 03/07/2023    8:44 AM  CBC  WBC 4.0 - 10.5 K/uL 5.5  4.4  4.3   Hemoglobin 12.0 - 15.0 g/dL 9.9  9.8  16.1   Hematocrit 36.0 - 46.0 % 29.9  29.5  30.1   Platelets 150 - 400 K/uL 143  105  134         Latest Ref Rng & Units 04/18/2023    8:32 AM 03/21/2023    8:12 AM 03/07/2023    8:44 AM  CMP  Glucose 70 - 99 mg/dL 096   045  409   BUN 8 - 23 mg/dL 11  12  13    Creatinine 0.44 - 1.00 mg/dL 8.11  9.14  7.82  Sodium 135 - 145 mmol/L 139  139  140   Potassium 3.5 - 5.1 mmol/L 3.8  4.1  3.8   Chloride 98 - 111 mmol/L 109  109  110   CO2 22 - 32 mmol/L 23  24  25    Calcium 8.9 - 10.3 mg/dL 9.1  8.9  9.2   Total Protein 6.5 - 8.1 g/dL 6.6  6.4  6.4   Total Bilirubin 0.3 - 1.2 mg/dL 0.6  0.4  0.4   Alkaline Phos 38 - 126 U/L 232  233  223   AST 15 - 41 U/L 22  23  28    ALT 0 - 44 U/L 11  16  17        RADIOGRAPHIC STUDIES: I have personally reviewed the radiological images as listed and agreed with the findings in the report. No results found.    No orders of the defined types were placed in this encounter.  All questions were answered. The patient knows to call the clinic with any problems, questions or concerns. No barriers to learning was detected. The total time spent in the appointment was 40 minutes.     Malachy Mood, MD 04/18/2023   Carolin Coy, CMA, am acting as scribe for Malachy Mood, MD.   I have reviewed the above documentation for accuracy and completeness, and I agree with the above.

## 2023-04-18 NOTE — Assessment & Plan Note (Signed)
-  Staging CT scan from December 25 2022 showed pulmonary embolism  in the left main pulmonary artery extending into the left upper and lower lobe branches. No right-sided pulmonary emboli. No findings for right heart strain.  -Patient was evaluated in the emergency room, no hypoxia or other concerning signs, patient was discharged home with Eliquis loading dose for a week. -She is tolerating well, will continue indefinitely   

## 2023-04-18 NOTE — Assessment & Plan Note (Addendum)
stage IV c(T2, N0, M1) with liver metastasis, MMR normal, Tempus (-) -Diagnosed in 01/2022. presented to PCP with jaundice for one month. Also complained of epigastric pain, poor appetite, weight loss, and light-colored stool.  -she began neoadjuvant gemcitabine alone on 02/16/22, abraxane was added with C2 (03/08/22).   -liver biopsy confirmed metastatic cancer  -Due to cancer progression, chemo changed to second line FOLFIRI on November 2023 -She unfortunately did not respond well to chemo, restaging scan from February 2024 showed disease progression in liver again -She was changed to third line chemotherapy FOLFOX on 01/03/23, she is overall tolerating well  -Her tumor marker CA 19.9 has increased significantly after first cycle FOLFOX, but it went down after 3 cycles chemo  -She had significant fatigue and dehydration after cycle 4 chemotherapy.  She recovered slowly.  I reduced her chemo dose from cycle 5. She cancelled her C6 chemo  -Unfortunately her restaging CT scan from Apr 02, 2023 showed cancer progression in pancreas in the liver.  I personally reviewed her scan images and discussed findings with patient -Due to her poor response to chemotherapy, and worsening performance status, I do not recommend further chemotherapy.  She unfortunately is not a candidate for immunotherapy or targeted therapy. -We discussed Perative care and hospice, the benefit and the logistics are reviewed with patient and her husband in detail today.

## 2023-04-20 ENCOUNTER — Inpatient Hospital Stay: Payer: Medicare HMO

## 2023-04-20 LAB — CANCER ANTIGEN 19-9: CA 19-9: 83988 U/mL — ABNORMAL HIGH (ref 0–35)

## 2023-04-22 ENCOUNTER — Inpatient Hospital Stay: Payer: Medicare HMO | Admitting: Licensed Clinical Social Worker

## 2023-04-22 DIAGNOSIS — C250 Malignant neoplasm of head of pancreas: Secondary | ICD-10-CM

## 2023-04-22 NOTE — Progress Notes (Signed)
[facility] CSW Progress Note  Clinical Child psychotherapist contacted patient by phone to check in.  Pt met with Dr. Mosetta Putt last week and chemotherapy was stopped due to disease progression.  Pt declining palliative and hospice services at this time.  Pt reports today she is doing okay.  Pt's spouse and daughter are providing support at needed.  Pt inquired about services to assist with assigning a POA and completing a will.  CSW provided pt with contact information for the [facility].  Pt also given contact information for CSW.  CSW to check on pt next week.    Rachel Moulds, LCSW Clinical Social Worker [facility]

## 2023-04-24 ENCOUNTER — Other Ambulatory Visit: Payer: Self-pay | Admitting: Nurse Practitioner

## 2023-04-24 ENCOUNTER — Other Ambulatory Visit: Payer: Self-pay

## 2023-04-24 ENCOUNTER — Other Ambulatory Visit: Payer: Self-pay | Admitting: Hematology

## 2023-04-30 ENCOUNTER — Inpatient Hospital Stay: Payer: Medicare HMO | Admitting: Licensed Clinical Social Worker

## 2023-04-30 ENCOUNTER — Telehealth: Payer: Self-pay

## 2023-04-30 DIAGNOSIS — C250 Malignant neoplasm of head of pancreas: Secondary | ICD-10-CM

## 2023-04-30 IMAGING — CT CT CHEST W/O CM
2 of 3 series · 15 of 36 positions shown, 18 images · non-contrast
Comparison: CT abdomen and pelvis 01/16/2022. Chest radiograph
10/29/2011

CLINICAL DATA: Pancreatic cancer staging



[Series 3: chest w/o 2mm st · axial · non-contrast · 0.75mm/px · z∈[-214,+32]mm · 12 of 145 slices shown, 15 images]
[im 11/145  mediastinal]
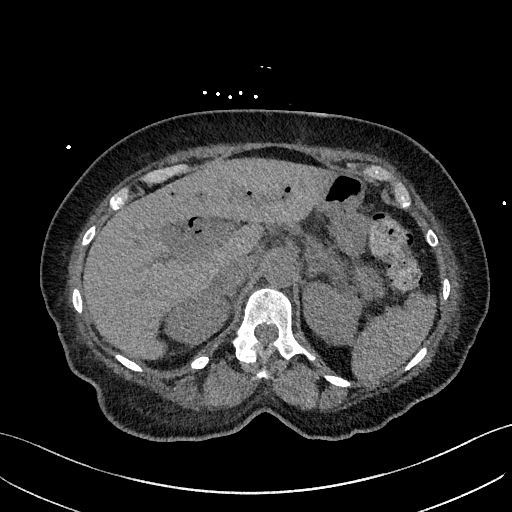
[im 11/145  lung]
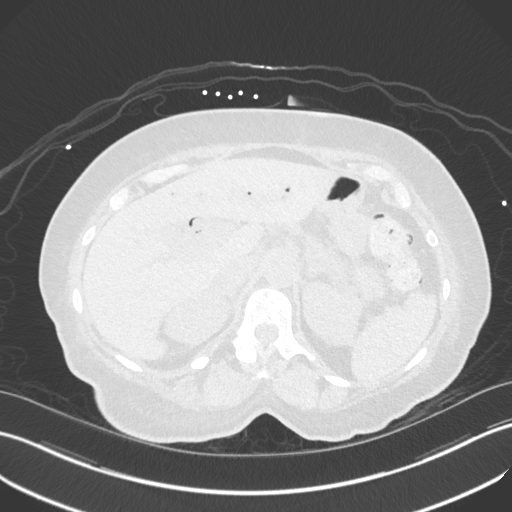
[im 22/145  lung]
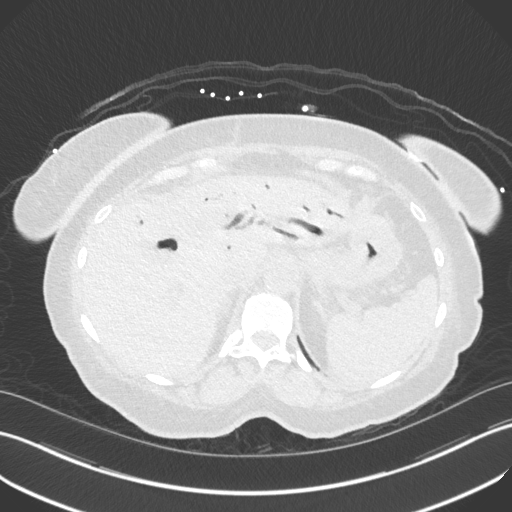
[im 33/145  lung]
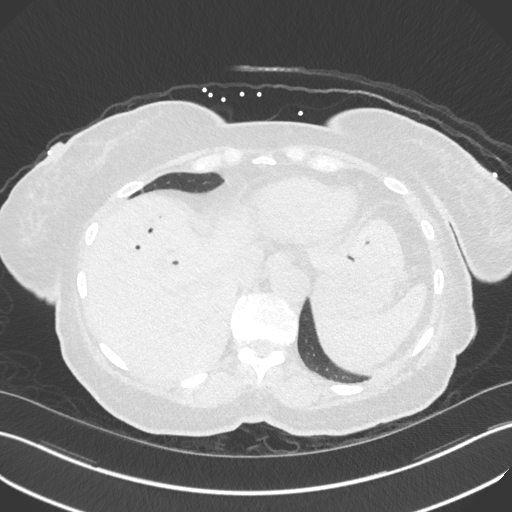
[im 43/145  lung]
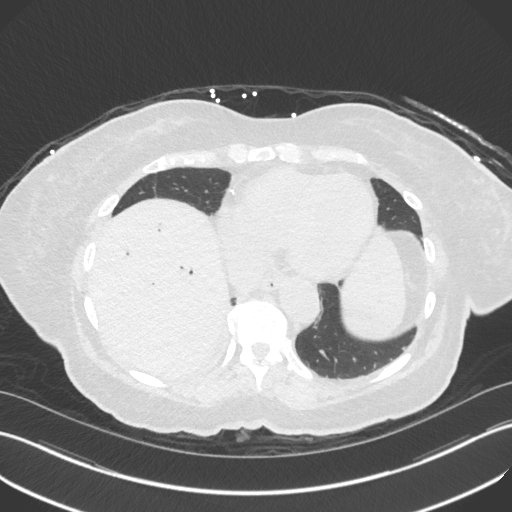
[im 54/145  mediastinal]
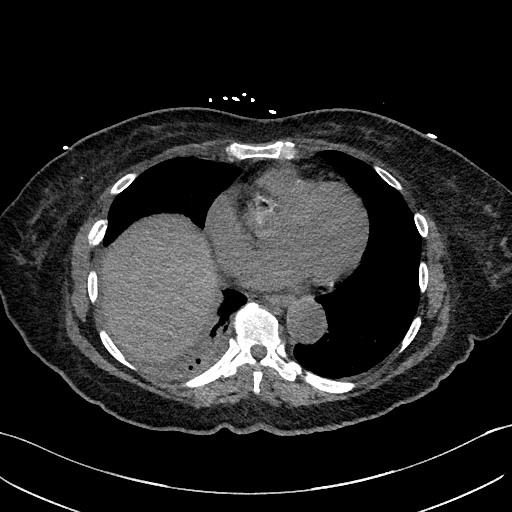
[im 54/145  lung]
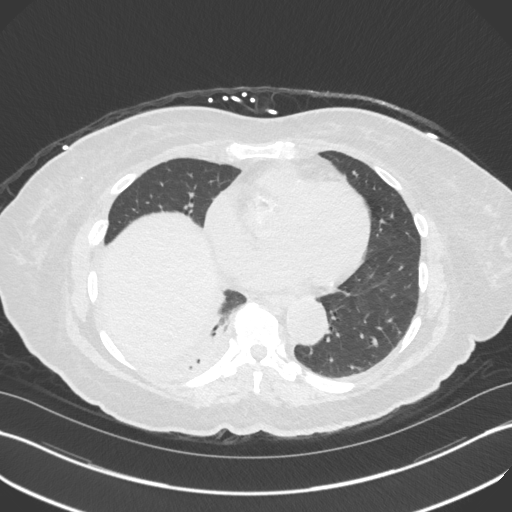
[im 65/145  lung]
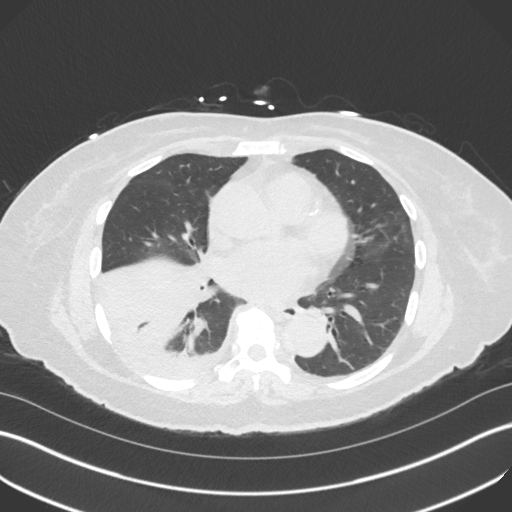
[im 81/145  lung]
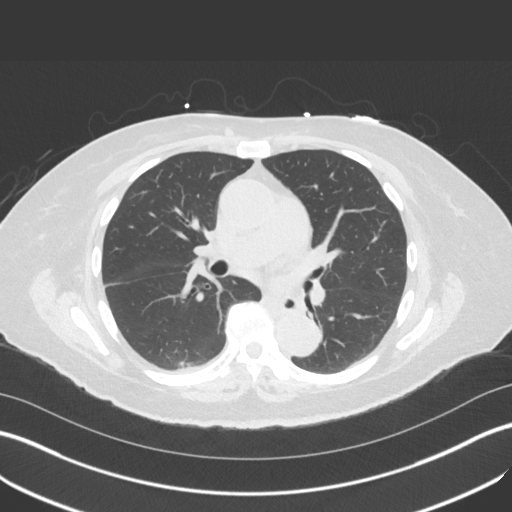
[im 91/145  lung]
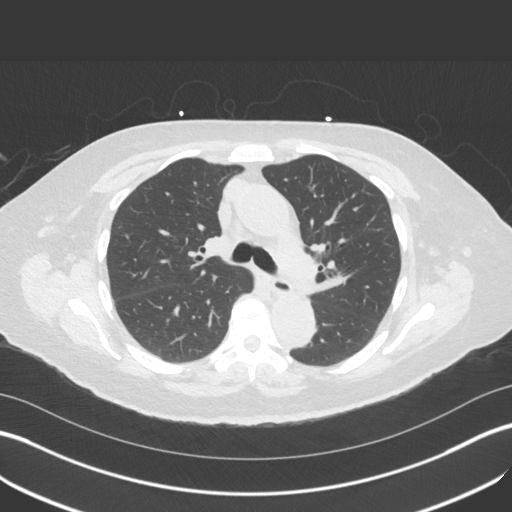
[im 102/145  mediastinal]
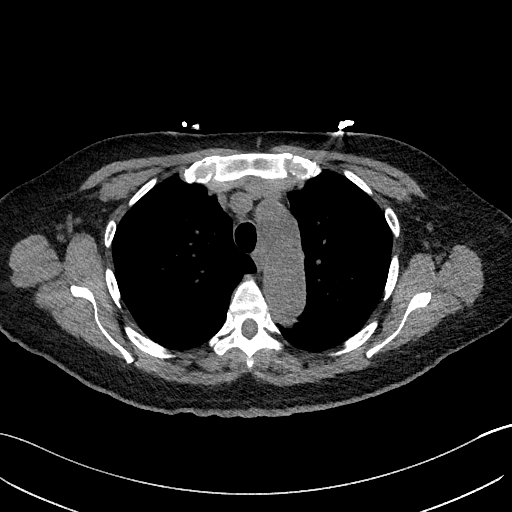
[im 102/145  lung]
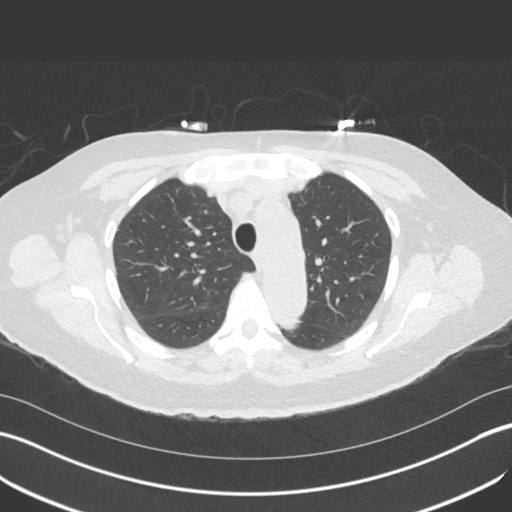
[im 113/145  lung]
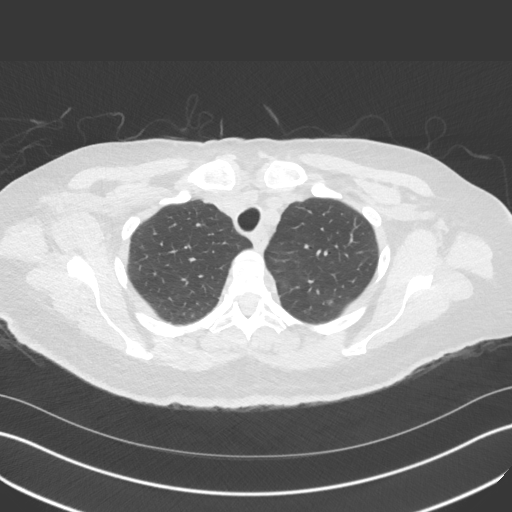
[im 123/145  lung]
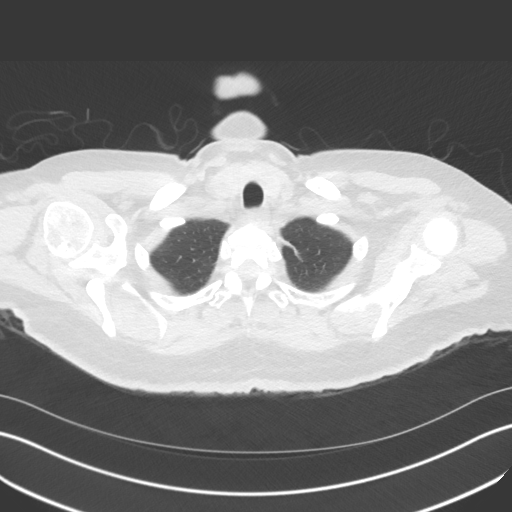
[im 134/145  lung]
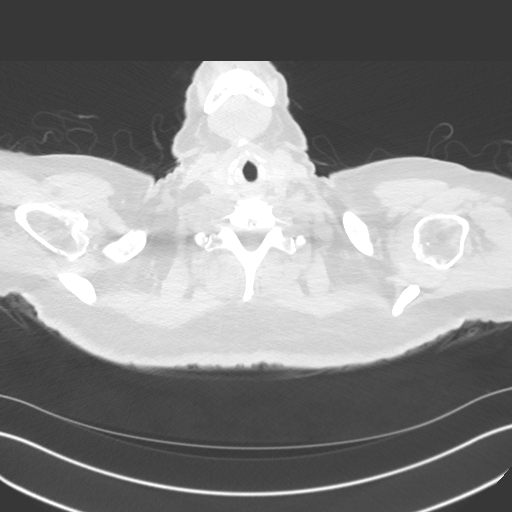

[Series 6: chest w/o 2mm st cor · coronal · non-contrast · 0.59mm/px · 3 of 135 slices shown]
[im 27/135  lung]
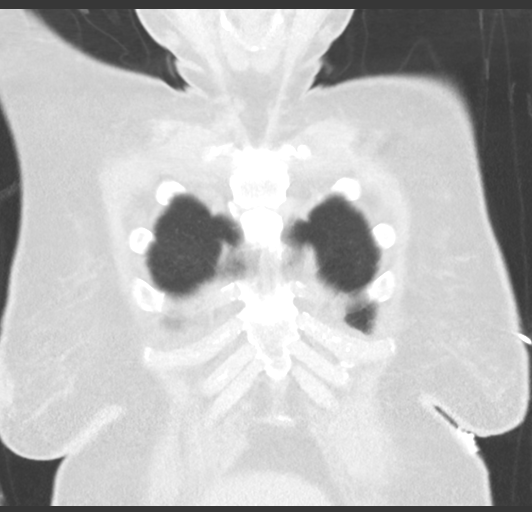
[im 54/135  lung]
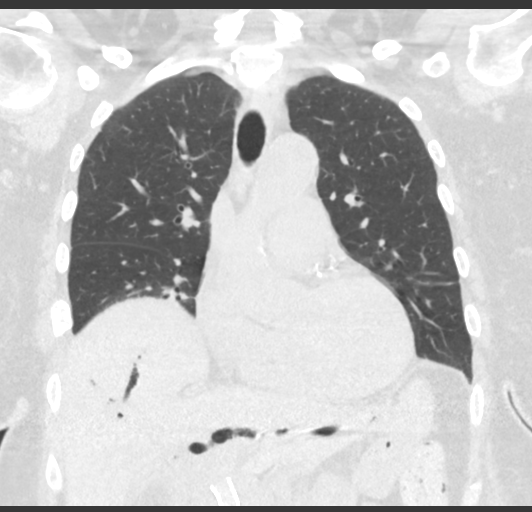
[im 81/135  lung]
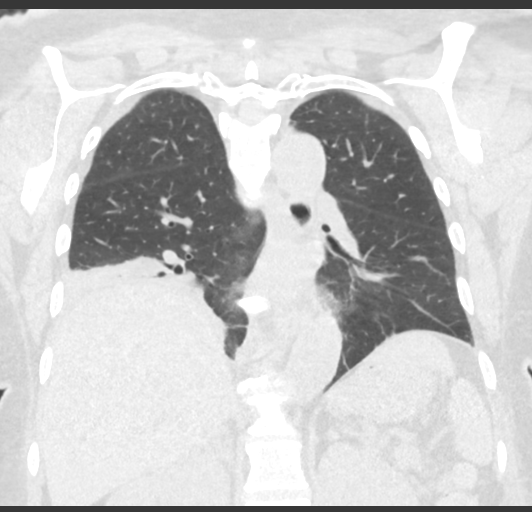

[15 of 36 positions shown; findings below may reference images not displayed]

FINDINGS: Cardiovascular: Normal heart size. No pericardial effusions. Normal
caliber thoracic aorta. Scattered aortic and coronary artery
calcifications.

Mediastinum/Nodes: Esophagus is decompressed. No significant
lymphadenopathy demonstrated in the chest. Scattered lymph nodes are
not pathologically enlarged. Thyroid gland is unremarkable.

Lungs/Pleura: Atelectasis or consolidation in the right lung base.
No pulmonary nodules are identified. Airways appear patent. No
pleural effusions.

Upper Abdomen: Mass in the pancreatic head as seen on prior CT
abdomen and pelvis, incompletely included within the field of view.
A bile duct stent is present. Gas in the gallbladder and bile ducts
is consistent with instrumentation.

Musculoskeletal: Degenerative changes in the spine. Healing
fractures of the right anterolateral fifth and sixth ribs. No
destructive bone lesions.
IMPRESSION: 1. No evidence of metastatic disease in the chest.
2. Atelectasis or consolidation in the right lower lung.
3. Healing right rib fractures.
4. Mass in the head of the pancreas. See previous report of CT
abdomen and pelvis 01/16/2022. Interval placement of the biliary
stent.

## 2023-04-30 NOTE — Progress Notes (Signed)
[facility] CSW Progress Note  Clinical Child psychotherapist contacted patient by phone to provide support.  Pt reports struggling with constipation.  CSW discussed palliative care services and how it may be beneficial with the stop of treatment to better address symptoms.  Pt verbalized agreement w/ a referral to palliative care.  Medical team informed of the above and will reach out to pt to instruct regarding constipation and schedule appointment w/ palliative care.  CSW to continue to provide support as appropriate.     Rachel Moulds, LCSW Clinical Social Worker [facility]

## 2023-04-30 NOTE — Telephone Encounter (Signed)
Spoke with pt regarding constipation management.  Pt stated she's taking Dulcolax and Miralax for constipation.  Pt stated she have not had a bowel movement.  Informed pt that the constipation she's experiencing is r/t the pt's pain medication Oxycodone.  Instructed pt to purchase Magnesium Citrate which is a liquid in a 20oz bottle.  Stated to drink half the bottle and if no bowel movement w/in 1 hour drink the other half of the bottle of Magnesium Citrate.  Stated for pt to start taking 2 ea Senokot tabs, 2 ea Colace, and 8oz glass of Miralax 3 times a day until bowels start flowing on a regular bases.  Once bowel start flowing reduce dosing to 1 Senokot Tab, 1 Colace, and 8oz glass of Miralax once daily.  Instructed pt if she develops diarrhea stop the bowel regimen medications and increase hydration by drinking Liquid IV, Pedialyte Hydration, or G2 Rehydration.  Informed pt that she also have a prescription for Lomotil that Dr. Mosetta Putt had prescribed.  Pt confirmed that she has Lomotil on hand at home.  Instructed pt to contact Dr. Latanya Maudlin office is the constipation has not resolved on 05/02/2023.  Pt verbalized understanding and had no further questions or concerns.  Informed pt that Dr. Mosetta Putt has placed a referral to our Palliative Care Team to help manage the pt's pain medications and other comfort needs.  Stated that pt has an appt with Palliative Care on 05/07/2023 at 1pm.  Pt confirmed appts.

## 2023-05-02 ENCOUNTER — Ambulatory Visit: Payer: Medicare HMO | Admitting: Hematology

## 2023-05-02 ENCOUNTER — Ambulatory Visit: Payer: Medicare HMO

## 2023-05-02 ENCOUNTER — Other Ambulatory Visit: Payer: Medicare HMO

## 2023-05-03 NOTE — Progress Notes (Signed)
Palliative Medicine [facility]  [phone]) [phone] [phone]) [phone]   Name: Miranda Francis Date: 05/03/2023 MRN: [id number]  DOB: [birth year]  Patient Care Team: Karle Plumber, MD as PCP - General (Internal Medicine) Maisie Fus, MD as PCP - Cardiology (Cardiology) Malachy Mood, MD as Consulting Physician (Oncology)    REASON FOR CONSULTATION: Miranda Francis is a 77 y.o. 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  Palliative ask to see for symptom management and goals of care.    SOCIAL HISTORY:     reports that she has never smoked. She has never used smokeless tobacco. She reports that she does not drink alcohol and does not use drugs.  ADVANCE DIRECTIVES:  None on file  CODE STATUS: Full code  PAST MEDICAL HISTORY: Past Medical History:  Diagnosis Date   Blood transfusion    Diabetes mellitus    Family history of breast cancer    Family history of colon cancer    Family history of prostate cancer    Heart murmur    Hypertension    pancreatic ca 12/2021    PAST SURGICAL HISTORY:  Past Surgical History:  Procedure Laterality Date   BILIARY STENT PLACEMENT  01/18/2022   Procedure: BILIARY STENT PLACEMENT;  Surgeon: Jeani Hawking, MD;  Location: [facility] ENDOSCOPY;  Service: Gastroenterology;;   DILATION AND CURETTAGE OF UTERUS     ERCP N/A 01/18/2022   Procedure: ENDOSCOPIC RETROGRADE CHOLANGIOPANCREATOGRAPHY (ERCP);  Surgeon: Jeani Hawking, MD;  Location: [facility] ENDOSCOPY;  Service: Gastroenterology;  Laterality: N/A;   ESOPHAGOGASTRODUODENOSCOPY N/A 01/18/2022   Procedure: ESOPHAGOGASTRODUODENOSCOPY (EGD);  Surgeon: Jeani Hawking, MD;  Location: [facility] ENDOSCOPY;  Service: Gastroenterology;  Laterality: N/A;   EUS  01/18/2022   Procedure: UPPER ENDOSCOPIC ULTRASOUND (EUS) LINEAR;  Surgeon: Jeani Hawking, MD;  Location: [facility]  ENDOSCOPY;  Service: Gastroenterology;;   FINE NEEDLE ASPIRATION  01/18/2022   Procedure: FINE NEEDLE ASPIRATION (FNA) LINEAR;  Surgeon: Jeani Hawking, MD;  Location: [facility] ENDOSCOPY;  Service: Gastroenterology;;   IR IMAGING GUIDED PORT INSERTION  02/15/2022   NO PAST SURGERIES     SPHINCTEROTOMY  01/18/2022   Procedure: Dennison Mascot;  Surgeon: Jeani Hawking, MD;  Location: [facility] ENDOSCOPY;  Service: Gastroenterology;;    HEMATOLOGY/ONCOLOGY HISTORY:  Oncology History Overview Note   Cancer Staging  Pancreatic cancer [facility]) Staging form: Exocrine Pancreas, AJCC 8th Edition - Clinical stage from 01/18/2022: Stage IB (cT2, cN0, cM0) - Signed by Malachy Mood, MD on 01/28/2022    Pancreatic cancer (HCC)  01/16/2022 Imaging   CLINICAL DATA:  Jaundice   EXAM: CT ABDOMEN AND PELVIS WITH CONTRAST  IMPRESSION: 1. Marked intra and extrahepatic biliary dilatation with marked dilatation of pancreatic duct and distended gallbladder. There is significant atrophy of the body and tail of pancreas. 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 2. Diverticular disease of the colon without acute wall thickening 3. Mild endometrial thickening up to 9 mm, recommend nonemergent pelvic ultrasound for further evaluation   01/17/2022 Imaging   EXAM: MRI ABDOMEN WITHOUT AND WITH CONTRAST (INCLUDING MRCP)  IMPRESSION: 1. Obstructing mass within the pancreatic head with an intraluminal component demonstrating low level enhancement. 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 2. This mass causes significant extrinsic narrowing at the portal/superior mesenteric vein confluence as well as marked intra and extrahepatic  biliary dilatation. 3. No distant metastases identified. 4. Endoscopy for endoscopic ultrasound, tissue sampling and biliary decompression recommended.   01/18/2022 Cancer Staging   Staging form: Exocrine Pancreas, AJCC 8th Edition - Clinical stage from 01/18/2022: Stage IB (cT2, cN0, cM0) - Signed by Malachy Mood, MD on 01/28/2022 Stage prefix: Initial diagnosis Total positive nodes: 0   01/18/2022 Initial Biopsy   FINAL MICROSCOPIC DIAGNOSIS:  A. PANCREAS, HEAD, FINE NEEDLE ASPIRATION:  - Malignant cells present consistent with adenocarcinoma   01/24/2022 Imaging   EXAM: CT CHEST WITHOUT CONTRAST  IMPRESSION: 1. No evidence of metastatic disease in the chest. 2. Atelectasis or consolidation in the right lower lung. 3. Healing right rib fractures. 4. Mass in the head of the pancreas. See previous report of CT abdomen and pelvis 01/16/2022. Interval placement of the biliary stent.   01/28/2022 Initial Diagnosis   Pancreatic cancer (HCC)   02/16/2022 - 06/28/2022 Chemotherapy   Patient is on Treatment Plan : PANCREATIC Abraxane / Gemcitabine D1,8 q21d     02/16/2022 Genetic Testing   Negative genetic testing on the CancerNext-Expanded+RNAinsight panel.  The report date is February 16, 2022.  The CancerNext-Expanded gene panel offered by [facility] and includes sequencing and rearrangement analysis for the following 77 genes: AIP, ALK, APC*, ATM*, AXIN2, BAP1, BARD1, BLM, BMPR1A, BRCA1*, BRCA2*, BRIP1*, CDC73, CDH1*, CDK4, CDKN1B, CDKN2A, CHEK2*, CTNNA1, DICER1, FANCC, FH, FLCN, GALNT12, KIF1B, LZTR1, MAX, MEN1, MET, MLH1*, MSH2*, MSH3, MSH6*, MUTYH*, NBN, NF1*, NF2, NTHL1, PALB2*, PHOX2B, PMS2*, POT1, PRKAR1A, PTCH1, PTEN*, RAD51C*, RAD51D*, RB1, RECQL, RET, SDHA, SDHAF2, SDHB, SDHC, SDHD, SMAD4, SMARCA4, SMARCB1, SMARCE1, STK11, SUFU, TMEM127, TP53*, TSC1, TSC2, VHL and XRCC2 (sequencing and deletion/duplication); EGFR, EGLN1, HOXB13, KIT, MITF, PDGFRA, POLD1, and POLE (sequencing only); EPCAM and  GREM1 (deletion/duplication only). DNA and RNA analyses performed for * genes.    06/14/2022 - 09/19/2022 Chemotherapy   Patient is on Treatment Plan : PANCREATIC Abraxane D1,8,15 + Gemcitabine D1,8,15 q28d     10/11/2022 - 12/08/2022 Chemotherapy   Patient is on Treatment Plan : PANCREAS Liposomal Irinotecan + Leucovorin + 5-FU IVCI q14d     12/25/2022 Imaging    IMPRESSION: 1. Left-sided pulmonary emboli. No findings for right heart strain. 2. Stable small left upper lobe pulmonary nodules. No new or progressive findings in the chest. 3. Overall stable appearing infiltrating pancreatic neoplasm. 4. New and interval enlargement of hepatic metastatic disease. 5. Biliary stent in place with associated pneumobilia and gas in the gallbladder. Mild to moderate biliary dilatation. 6. 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 Extensive portal venous collateral vessels.     01/03/2023 - 03/23/2023 Chemotherapy   Patient is on Treatment Plan : PANCREAS FOLFOX q14d      Pathology Results      Pathology Results      Miscellaneous        ALLERGIES:  is allergic to irinotecan liposome.  MEDICATIONS:  Current Outpatient Medications  Medication Sig Dispense Refill   acetaminophen (TYLENOL) 500 MG tablet Take 1,000 mg by mouth every 6 (six) hours as needed for moderate pain or headache.     amLODipine (NORVASC) 5 MG tablet TAKE 1 TABLET (5 MG TOTAL) BY MOUTH DAILY. 90 tablet 1   Cholecalciferol (QC VITAMIN D3) 50 MCG (2000 UT) TABS Take 2,000 Units by mouth daily.  Continuous Blood Gluc Sensor (FREESTYLE LIBRE 2 SENSOR) MISC USE 1 SENSOR AND CHANGE EVERY 14 DAYS AS DIRECTED.     diphenoxylate-atropine (LOMOTIL) 2.5-0.025 MG tablet Take 1 tablet by mouth 4 (four) times daily as needed for diarrhea or loose stools. 30 tablet 0   ELIQUIS 5 MG TABS tablet TAKE 1 TABLET BY MOUTH TWICE A DAY 60 tablet 2   gabapentin (NEURONTIN) 100 MG capsule Take 1-2 capsules  (100-200 mg total) by mouth 2 (two) times daily. 90 capsule 1   KLOR-CON M20 20 MEQ tablet Take 20 mEq by mouth 2 (two) times daily.     lidocaine-prilocaine (EMLA) cream Apply 1 application topically as needed. 30 g 1   losartan (COZAAR) 100 MG tablet Take 100 mg by mouth daily.     NOVOLOG FLEXPEN 100 UNIT/ML FlexPen Inject 15 Units into the skin 2 (two) times daily.     oxyCODONE (OXY IR/ROXICODONE) 5 MG immediate release tablet Take 1 tablet (5 mg total) by mouth every 3 (three) hours as needed for severe pain. 20 tablet 0   polyethylene glycol (MIRALAX / GLYCOLAX) 17 g packet Take 17 g by mouth daily. 14 each 0   zolpidem (AMBIEN) 10 MG tablet Take 10 mg by mouth at bedtime as needed.     No current facility-administered medications for this visit.    VITAL SIGNS: There were no vitals taken for this visit. There were no vitals filed for this visit.  Estimated body mass index is 25.97 kg/m as calculated from the following:   Height as of 04/18/23: 5\' 2"  (1.575 m).   Weight as of 04/18/23: 142 lb (64.4 kg).  LABS: CBC:    Component Value Date/Time   WBC 5.5 04/18/2023 0832   WBC 4.8 12/25/2022 1205   HGB 9.9 (L) 04/18/2023 0832   HCT 29.9 (L) 04/18/2023 0832   PLT 143 (L) 04/18/2023 0832   MCV 92.9 04/18/2023 0832   NEUTROABS 3.5 04/18/2023 0832   LYMPHSABS 1.5 04/18/2023 0832   MONOABS 0.4 04/18/2023 0832   EOSABS 0.1 04/18/2023 0832   BASOSABS 0.0 04/18/2023 0832   Comprehensive Metabolic Panel:    Component Value Date/Time   NA 139 04/18/2023 0832   K 3.8 04/18/2023 0832   CL 109 04/18/2023 0832   CO2 23 04/18/2023 0832   BUN 11 04/18/2023 0832   CREATININE 0.62 04/18/2023 0832   GLUCOSE 124 (H) 04/18/2023 0832   CALCIUM 9.1 04/18/2023 0832   AST 22 04/18/2023 0832   ALT 11 04/18/2023 0832   ALKPHOS 232 (H) 04/18/2023 0832   BILITOT 0.6 04/18/2023 0832   PROT 6.6 04/18/2023 0832   ALBUMIN 3.5 04/18/2023 0832    RADIOGRAPHIC STUDIES: No results  found.  PERFORMANCE STATUS (ECOG) : {CHL ONC ECOG ZO:[id number]}  Review of Systems Unless otherwise noted, a complete review of systems is negative.  Physical Exam General: NAD Cardiovascular: regular rate and rhythm Pulmonary: clear ant fields Abdomen: soft, nontender, + bowel sounds Extremities: no edema, no joint deformities Skin: no rashes Neurological:  IMPRESSION: *** I introduced myself, Maygan RN, and Palliative's role in collaboration with the oncology team. Concept of Palliative Care was introduced as specialized medical care for people and their families living with serious illness.  It focuses on providing relief from the symptoms and stress of a serious illness.  The goal is to improve quality of life for both the patient and the family. Values and goals of care important to patient and family were attempted  to be elicited.    We discussed *** current illness and what it means in the larger context of *** on-going co-morbidities. Natural disease trajectory and expectations were discussed.  I discussed the importance of continued conversation with family and their medical providers regarding overall plan of care and treatment options, ensuring decisions are within the context of the patients values and GOCs.  PLAN: Established therapeutic relationship. Education provided on palliative's role in collaboration with their Oncology/Radiation team. I will plan to see patient back in 2-4 weeks in collaboration to other oncology appointments.    Patient expressed understanding and was in agreement with this plan. She also understands that She can call the clinic at any time with any questions, concerns, or complaints.   Thank you for your referral and allowing Palliative to assist in Miranda Francis's care.   Number and complexity of problems addressed: ***HIGH - 1 or more chronic illnesses with SEVERE exacerbation, progression, or side effects of treatment - advanced  cancer, pain. Any controlled substances utilized were prescribed in the context of palliative care.   Visit consisted of counseling and education dealing with the complex and emotionally intense issues of symptom management and palliative care in the setting of serious and potentially life-threatening illness.Greater than 50%  of this time was spent counseling and coordinating care related to the above assessment and plan.  Signed by: Willette Alma, AGPCNP-BC Palliative Medicine Team/[facility]   *Please note that this is a verbal dictation therefore any spelling or grammatical errors are due to the "[employer] One" system interpretation.

## 2023-05-04 ENCOUNTER — Ambulatory Visit: Payer: Medicare HMO

## 2023-05-07 ENCOUNTER — Inpatient Hospital Stay (HOSPITAL_BASED_OUTPATIENT_CLINIC_OR_DEPARTMENT_OTHER): Payer: Medicare HMO | Admitting: Nurse Practitioner

## 2023-05-07 ENCOUNTER — Other Ambulatory Visit (HOSPITAL_BASED_OUTPATIENT_CLINIC_OR_DEPARTMENT_OTHER): Payer: Self-pay

## 2023-05-07 ENCOUNTER — Other Ambulatory Visit: Payer: Self-pay

## 2023-05-07 ENCOUNTER — Encounter: Payer: Self-pay | Admitting: Nurse Practitioner

## 2023-05-07 VITALS — BP 129/96 | HR 84 | Temp 98.5°F | Resp 20 | Wt 141.7 lb

## 2023-05-07 DIAGNOSIS — R530 Neoplastic (malignant) related fatigue: Secondary | ICD-10-CM

## 2023-05-07 DIAGNOSIS — Z515 Encounter for palliative care: Secondary | ICD-10-CM | POA: Diagnosis not present

## 2023-05-07 DIAGNOSIS — Z7189 Other specified counseling: Secondary | ICD-10-CM

## 2023-05-07 DIAGNOSIS — C250 Malignant neoplasm of head of pancreas: Secondary | ICD-10-CM | POA: Diagnosis not present

## 2023-05-07 DIAGNOSIS — G893 Neoplasm related pain (acute) (chronic): Secondary | ICD-10-CM | POA: Diagnosis not present

## 2023-05-07 DIAGNOSIS — R630 Anorexia: Secondary | ICD-10-CM

## 2023-05-07 MED ORDER — XTAMPZA ER 9 MG PO C12A
9.0000 mg | EXTENDED_RELEASE_CAPSULE | Freq: Two times a day (BID) | ORAL | 0 refills | Status: DC
Start: 2023-05-07 — End: 2023-05-13
  Filled 2023-05-07: qty 30, 15d supply, fill #0

## 2023-05-07 MED ORDER — OXYCODONE HCL 10 MG PO TABS
5.0000 mg | ORAL_TABLET | ORAL | 0 refills | Status: DC | PRN
Start: 2023-05-07 — End: 2023-05-07
  Filled 2023-05-07: qty 60, 20d supply, fill #0

## 2023-05-07 MED ORDER — OXYCODONE HCL 10 MG PO TABS
10.0000 mg | ORAL_TABLET | ORAL | 0 refills | Status: DC | PRN
Start: 2023-05-07 — End: 2023-07-25
  Filled 2023-05-07: qty 60, 10d supply, fill #0

## 2023-05-07 NOTE — Patient Instructions (Signed)
-   start your long acting pain medication called xtampza (long acting oxycodone), take one pill every 12 hours regardless of pain level (7am, 7pm OR 8am, 8pm,etc.). the new long acting pain medication may make you sleepy for the first few doses, this soul go away as your body gets used to it - increase our oxycodone 10mg  to take every 4 hours as needed for your pain, if you do not need it you do not have to take it - take Senakot 2 times a day every day for constipation as well as mirilax, if you develop loose stools go back to once a day  - give the office a call 3 days before you need a refill - if you develop a rash stop taking the medication and call the office, if you develop shortness of breath call 911

## 2023-05-08 ENCOUNTER — Other Ambulatory Visit (HOSPITAL_BASED_OUTPATIENT_CLINIC_OR_DEPARTMENT_OTHER): Payer: Self-pay

## 2023-05-09 ENCOUNTER — Encounter: Payer: Self-pay | Admitting: Hematology

## 2023-05-10 ENCOUNTER — Inpatient Hospital Stay: Payer: Medicare HMO | Admitting: Nurse Practitioner

## 2023-05-10 NOTE — Progress Notes (Signed)
This palliative RN called to check in on patient after medication adjustments. Patient reports that her pharmacy was down so she is only on day 2 of her medication changes dan cannot tell a difference yet. Patient also mentioned that she is continuing to experience gas symptoms. Patient informed to take OTC simethicone as needed for her gas. Patient verbalized understanding. Patient to be called next week for another check-in. No further needs at this time.

## 2023-05-13 ENCOUNTER — Inpatient Hospital Stay: Payer: Medicare HMO | Attending: Physician Assistant | Admitting: Nurse Practitioner

## 2023-05-13 ENCOUNTER — Other Ambulatory Visit: Payer: Self-pay

## 2023-05-13 ENCOUNTER — Other Ambulatory Visit (HOSPITAL_BASED_OUTPATIENT_CLINIC_OR_DEPARTMENT_OTHER): Payer: Self-pay

## 2023-05-13 DIAGNOSIS — C250 Malignant neoplasm of head of pancreas: Secondary | ICD-10-CM | POA: Insufficient documentation

## 2023-05-13 DIAGNOSIS — C787 Secondary malignant neoplasm of liver and intrahepatic bile duct: Secondary | ICD-10-CM | POA: Insufficient documentation

## 2023-05-13 DIAGNOSIS — Z79899 Other long term (current) drug therapy: Secondary | ICD-10-CM | POA: Insufficient documentation

## 2023-05-13 MED ORDER — FENTANYL 12 MCG/HR TD PT72
1.0000 | MEDICATED_PATCH | TRANSDERMAL | 0 refills | Status: DC
  Filled 2023-05-13: qty 5, 15d supply, fill #0

## 2023-05-13 NOTE — Progress Notes (Signed)
This palliative RN called this patient to check post-weekend to assess how her new medication was working for her. Patient reports that she has not noticed a decrease in pain since starting her Xtampza, rating her pain at a 8-9/10 most days. When discussing her oxy IR pt reports only taking it one time a day due to being so sleepy since adding on her Xtampza. Patient also reported some mild itching on her back, which is new since starting the xtampza, and some itching around her mouth and nose, which was occurring while she was taking the oxy IR only (before her initial palliative appointment). This RN deferred to Jefferson, NP, for advisement of next steps. Per Lowella Bandy, Np pt to continue oxycodone IR, discontinue tampza, and start on a fentanyl patch. This RN explained in detail how the patch works, how to use it, and what side effects to expect, when to call, dan when to go to the ED. Patient verbalized understanding of education. Pt agree to call with any questions or concerns, no further needs at this time.

## 2023-05-14 ENCOUNTER — Other Ambulatory Visit: Payer: Self-pay

## 2023-05-14 ENCOUNTER — Other Ambulatory Visit (HOSPITAL_BASED_OUTPATIENT_CLINIC_OR_DEPARTMENT_OTHER): Payer: Self-pay

## 2023-05-15 ENCOUNTER — Other Ambulatory Visit (HOSPITAL_BASED_OUTPATIENT_CLINIC_OR_DEPARTMENT_OTHER): Payer: Self-pay

## 2023-05-17 ENCOUNTER — Inpatient Hospital Stay: Payer: Medicare HMO | Admitting: Nurse Practitioner

## 2023-05-17 ENCOUNTER — Other Ambulatory Visit (HOSPITAL_BASED_OUTPATIENT_CLINIC_OR_DEPARTMENT_OTHER): Payer: Self-pay

## 2023-05-17 MED ORDER — DICYCLOMINE HCL 10 MG PO CAPS
10.0000 mg | ORAL_CAPSULE | Freq: Three times a day (TID) | ORAL | 0 refills | Status: DC
  Filled 2023-05-17: qty 90, 30d supply, fill #0

## 2023-05-17 NOTE — Progress Notes (Signed)
Patient called by this RN to check in on pain management. Pt reports that she is still having pain, upon assessment she reported that she has not taken her oxycodone 10mg  in 24 hours and her patch was placed incorrectly the first day she used it. Pt has now correctly placed her patch on, education provided on maximizing the use of her oxycodone to manage ongoing pain. Patient also educated on gabapentin use and maximizing use of that as well. Upon discussion with patient, she was reporting pain increased during meal times, causing her to not eat or drink and her urine to become dark, pt encouraged to drink more fluids and medication bentyl sent in per [facility], NP order. Pt educated on how to take bentyl pt verbalized understanding of medication changes and education. Pt verbalized to call with any questions or concerns.

## 2023-05-22 ENCOUNTER — Other Ambulatory Visit: Payer: Self-pay

## 2023-05-22 DIAGNOSIS — C250 Malignant neoplasm of head of pancreas: Secondary | ICD-10-CM

## 2023-05-22 IMAGING — US IR IMAGING GUIDED PORT INSERTION
1 series · 1 of 1 positions shown · non-contrast
Comparison: none

INDICATION: Pancreatic cancer.

[Series 1: ir fluoro/shunt/fist · 1 of 1 slices shown]
[im 1/1]
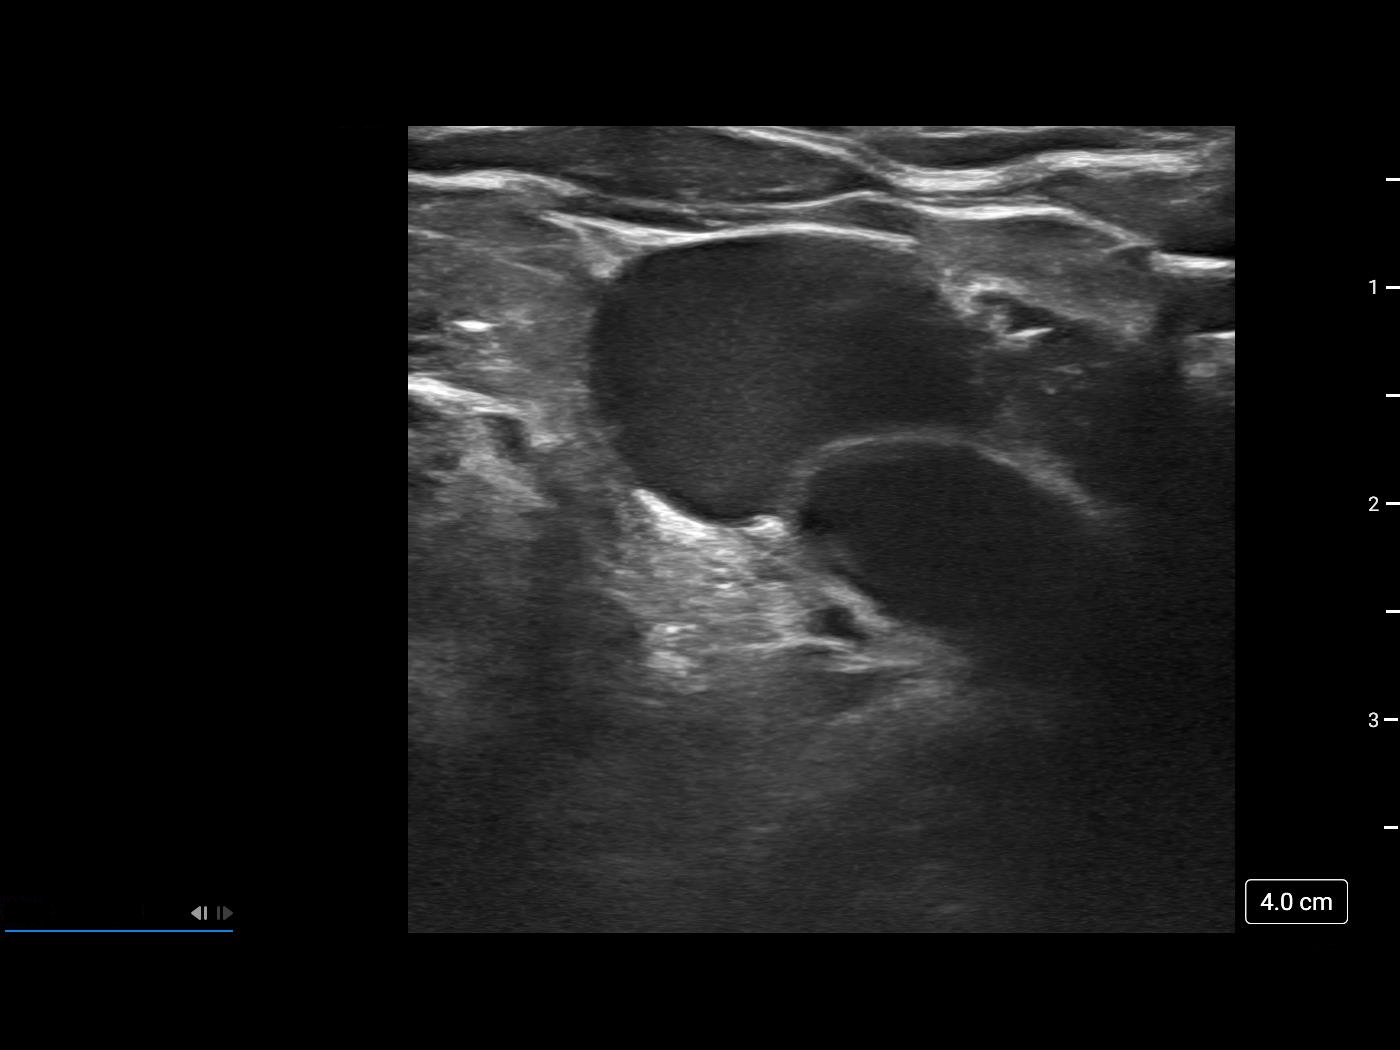

[1 of 1 positions shown; findings below may reference images not displayed]

EXAM:
IMPLANTED PORT A CATH PLACEMENT WITH ULTRASOUND AND FLUOROSCOPIC
GUIDANCE

MEDICATIONS:
None

ANESTHESIA/SEDATION:
Moderate (conscious) sedation was employed during this procedure. A
total of Versed 2 mg and Fentanyl 100 mcg was administered
intravenously.

Moderate Sedation Time: 19 minutes. The patient's level of
consciousness and vital signs were monitored continuously by
radiology nursing throughout the procedure under my direct
supervision.

FLUOROSCOPY TIME:  (0 mGy)

COMPLICATIONS:
None immediate.

PROCEDURE:
The procedure, risks, benefits, and alternatives were explained to
the patient. Questions regarding the procedure were encouraged and
answered. The patient understands and consents to the procedure.

The RIGHT neck and chest were prepped with chlorhexidine in a
sterile fashion, and a sterile drape was applied covering the
operative field. Maximum barrier sterile technique with sterile
gowns and gloves were used for the procedure. A timeout was
performed prior to the initiation of the procedure. Local anesthesia
was provided with 1% lidocaine with epinephrine.

After creating a small venotomy incision, a micropuncture kit was
utilized to access the internal jugular vein under direct, real-time
ultrasound guidance. Ultrasound image documentation was performed.
The microwire was kinked to measure appropriate catheter length.

A subcutaneous port pocket was then created along the upper chest
wall utilizing a combination of sharp and blunt dissection. The
pocket was irrigated with sterile saline. A single lumen ISP power
injectable port was chosen for placement. The 8 Fr catheter was
tunneled from the port pocket site to the venotomy incision. The
port was placed in the pocket. The external catheter was trimmed to
appropriate length. At the venotomy, an 8 Fr peel-away sheath was
placed over a guidewire under fluoroscopic guidance. The catheter
was then placed through the sheath and the sheath was removed. Final
catheter positioning was confirmed and documented with a
fluoroscopic spot radiograph. The port was accessed with Ahmad Sha Lakan
needle, aspirated and flushed with heparinized saline.

The port pocket incision was closed with interrupted 3-0 Vicryl
suture then Dermabond was applied, including at the venotomy
incision. Dressings were placed. The patient tolerated the procedure
well without immediate post procedural complication.
IMPRESSION: Successful placement of a RIGHT internal jugular approach power
injectable Port-A-Cath.

The tip of the catheter is positioned within the proximal RIGHT
atrium. The catheter is ready for immediate use.

## 2023-05-22 NOTE — Progress Notes (Unsigned)
Palliative Medicine [facility]  [phone] [phone]   Name: Miranda Francis Date: 05/22/2023 MRN: [id number]  DOB: [birth year]  Patient Care Team: Karle Plumber, MD as PCP - General (Internal Medicine) Maisie Fus, MD as PCP - Cardiology (Cardiology) Malachy Mood, MD as Consulting Physician (Oncology) Pickenpack-Cousar, Arty Baumgartner, NP as Nurse Practitioner (Nurse Practitioner)    INTERVAL HISTORY: Miranda Francis is a 77 y.o. 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  Palliative ask to see for symptom management and goals of care.     SOCIAL HISTORY:     reports that she has never smoked. She has never used smokeless tobacco. She reports that she does not drink alcohol and does not use drugs.  ADVANCE DIRECTIVES:  None on file  CODE STATUS: DNR  PAST MEDICAL HISTORY: Past Medical History:  Diagnosis Date   Blood transfusion    Diabetes mellitus    Family history of breast cancer    Family history of colon cancer    Family history of prostate cancer    Heart murmur    Hypertension    pancreatic ca 12/2021    ALLERGIES:  is allergic to irinotecan liposome.  MEDICATIONS:  Current Outpatient Medications  Medication Sig Dispense Refill   acetaminophen (TYLENOL) 500 MG tablet Take 1,000 mg by mouth every 6 (six) hours as needed for moderate pain or headache.     amLODipine (NORVASC) 5 MG tablet TAKE 1 TABLET (5 MG TOTAL) BY MOUTH DAILY. 90 tablet 1   Cholecalciferol (QC VITAMIN D3) 50 MCG (2000 UT) TABS Take 2,000 Units by mouth daily.     Continuous Blood Gluc Sensor (FREESTYLE LIBRE 2 SENSOR) MISC USE 1 SENSOR AND CHANGE EVERY 14 DAYS AS DIRECTED.     dicyclomine (BENTYL) 10 MG capsule Take 1 capsule (10 mg total) by mouth 3 (three) times daily before meals. 90 capsule 0   diphenoxylate-atropine  (LOMOTIL) 2.5-0.025 MG tablet Take 1 tablet by mouth 4 (four) times daily as needed for diarrhea or loose stools. 30 tablet 0   ELIQUIS 5 MG TABS tablet TAKE 1 TABLET BY MOUTH TWICE A DAY 60 tablet 2   fentaNYL (DURAGESIC) 12 MCG/HR Place 1 patch onto the skin every 3 (three) days. 5 patch 0   gabapentin (NEURONTIN) 100 MG capsule Take 1-2 capsules (100-200 mg total) by mouth 2 (two) times daily. 90 capsule 1   KLOR-CON M20 20 MEQ tablet Take 20 mEq by mouth 2 (two) times daily.     lidocaine-prilocaine (EMLA) cream Apply 1 application topically as needed. 30 g 1   losartan (COZAAR) 100 MG tablet Take 100 mg by mouth daily.     NOVOLOG FLEXPEN 100 UNIT/ML FlexPen Inject 15 Units into the skin 2 (two) times daily.     Oxycodone HCl 10 MG TABS Take 1 tablet (10 mg total) by mouth every 4 (four) hours as needed. 60 tablet 0   polyethylene glycol (MIRALAX / GLYCOLAX) 17 g packet Take 17 g by mouth daily. 14 each 0   zolpidem (AMBIEN) 10 MG tablet Take 10 mg by mouth at bedtime as needed.     No current facility-administered medications for this visit.    VITAL SIGNS: There were no vitals taken for this visit. There were no vitals filed for this visit.  Estimated body mass index is 25.92 kg/m as calculated  from the following:   Height as of 04/18/23: 5\' 2"  (1.575 m).   Weight as of 05/07/23: 141 lb 11.2 oz (64.3 kg).   PERFORMANCE STATUS (ECOG) : 1 - Symptomatic but completely ambulatory   Physical Exam General: ill appearing, weak HEENT: Scleral icterus Cardiovascular: regular rate and rhythm Pulmonary: diminished  Abdomen: soft, tender, mild distension,+ bowel sounds Extremities: no edema, no joint deformities Skin: Jaundice Neurological: AAO x4  IMPRESSION: Miranda Francis presents to clinic today for follow-up. Increase weakness. Complains of some dizziness with sudden position changes. Relates this to decreased intake. Denies nausea, vomiting, constipation, or diarrhea. Her husband  is present. Patient seen by Dr. Mosetta Putt today. Unfortunately Miranda Francis continues to show signs of decline and progression. Sent for ultrasound with pending results. She is now jaundice. Husband expresses his concern for his wife.   Neoplasm related pain Miranda Francis complains of constant right-sided abdominal pain that radiates around towards her back and mid abdomen area.  Reports feelings of early satiety. Pain is severe when eating. States pain is constant. Some improvement with fentanyl patches however no significant change.   She is concerned with overall financial cost of medications. States she paid $46 for 5 patches. Would like to continue with current dose and consider if she would like to continue use in upcoming days due to affordability. We previously attempted oral long-acting medication however patient did not like how this made her feel.   She is taking gabapentin twice daily and oxycodone as needed. States she does not like taking a lot of medications and again references cost.   We will continue to closely monitor and adjust medications as needed.  Patient and husband knows to contact office as needed.   Decreased appetite/weight loss Continues to have poor appetite. Reports feelings of early satiety and significant feelings of pain anytime she eats or drink. This is causing her to limit her intake and has resulted in little to no appetite. Her current weight is 141 lbs.   Education provided on the use of Carafate for support and symptom management. She and husband verbalized understanding. Prescription sent. Due to cost of almost $125 for smaller bottle Carafate pills sent in and patient instructed to dissolve prior to taking. This is most cost efficient for her. She verbalized understanding.   Constipation Controlled with medications.    Goals of Care  Unfortunately Miranda Francis's overall health and performance status continues to decline. She is very much appropriate for  hospice support in the home. I empathetically attempted to approach discussions regarding hospice and end-of-life however patient continues to decline stating "I want to live". Acknowledged her statement. Patient's husband is aware of patient's poor prognosis and is realistic in his understanding. He is trying to support his wife however is tearful when discussing her decline and facing end-of-life.   Patient states she is tired and would like to go home and get in her bed. They are aware at anytime hospice can be arranged to come out and offer support. I worry patient is rapidly approaching end-of-life. Education provided to husband to contact office with significant changes and/or seek emergency assistance if change in status such as unresponsive, confusion, increased weakness, nausea, vomiting, increased abdominal distention, increase in pain, or sudden death. He expressed appreciation and verbalized understanding.   05/07/23- We discussed her current illness and what it means in the larger context of her on-going co-morbidities. Natural disease trajectory and expectations were discussed.   Mrs. Gras and her husband  verbalized understanding of her current pancreatic cancer and is incurable nature including disease trajectory.  Her husband expressed emotionally his anxiety and fear of knowing that at some point his wife will pass away from this battle.  Emotional support provided.  I created space and opportunity to allow them to share their thoughts and feelings.  They are taking things one day at a time trying to focus on each other and making memories with her family.  Their children are aware of the situation and are supportive.  Although Mrs. Maynor is realistic and knowing at some point she will faced end-of-life she chooses not to focus on this currently.   They are clear and expressed wishes to continue to focus on aggressive symptom management and maintain close follow-up for as long as  they can.  We discussed the differences between palliative and hospice support.  They are not ready to transition care to hospice however understand this will be most appropriate in the future. We briefly discussed what care what services would look like in the home. Patient and husband has confirmed wishes for DNR/DNI.    I empathetically approached discussions regarding code status and advanced directives. Bryannah shares she and husband have an appointment on Thursday to complete her directives. We discussed her current code status with consideration of her current illness and prognosis. Mrs. Reierson expresses her desire for a natural death with no life-sustaining measures. DNR/DNI.   We discussed Her current illness and what it means in the larger context of Her on-going co-morbidities. Natural disease trajectory and expectations were discussed.  I discussed the importance of continued conversation with family and their medical providers regarding overall plan of care and treatment options, ensuring decisions are within the context of the patients values and GOCs.  PLAN:  DNR/DNI as requested by patient Extensive goals of care discussion. Patient clear in expressed wishes to aggressively manage symptoms. She is not ready to approach hospice services however is realistic in knowing this is appropriate to consider at this time given significant decline, cancer progression, and poor prognosis.  Fentanyl 12 mcg patch Oxy IR 10mg  every 4 hours as needed for breakthrough  Gabapentin 200mg  twice daily Carafate four times daily Miralax daily for bowel regimen Ongoing symptom management and goals of care discussions.  I will plan to see patient back in 1 week  in collaboration to other oncology appointments. Husband knows to contact office sooner if needed or seek medical attention with any changes or worsening symptoms.    Patient expressed understanding and was in agreement with this plan. She also  understands that She can call the clinic at any time with any questions, concerns, or complaints.   Any controlled substances utilized were prescribed in the context of palliative care. PDMP has been reviewed.    Visit consisted of counseling and education dealing with the complex and emotionally intense issues of symptom management and palliative care in the setting of serious and potentially life-threatening illness.Greater than 50%  of this time was spent counseling and coordinating care related to the above assessment and plan.  Willette Alma, AGPCNP-BC  Palliative Medicine Team/ Cancer Center  *Please note that this is a verbal dictation therefore any spelling or grammatical errors are due to the "[employer]" system interpretation.

## 2023-05-23 ENCOUNTER — Other Ambulatory Visit (HOSPITAL_BASED_OUTPATIENT_CLINIC_OR_DEPARTMENT_OTHER): Payer: Self-pay

## 2023-05-23 ENCOUNTER — Other Ambulatory Visit: Payer: Self-pay

## 2023-05-23 ENCOUNTER — Inpatient Hospital Stay (HOSPITAL_BASED_OUTPATIENT_CLINIC_OR_DEPARTMENT_OTHER): Payer: Medicare HMO | Admitting: Nurse Practitioner

## 2023-05-23 ENCOUNTER — Encounter: Payer: Self-pay | Admitting: Hematology

## 2023-05-23 ENCOUNTER — Inpatient Hospital Stay: Payer: Medicare HMO

## 2023-05-23 ENCOUNTER — Encounter: Payer: Self-pay | Admitting: Nurse Practitioner

## 2023-05-23 ENCOUNTER — Telehealth: Payer: Self-pay

## 2023-05-23 ENCOUNTER — Ambulatory Visit (HOSPITAL_COMMUNITY)
Admission: RE | Admit: 2023-05-23 | Discharge: 2023-05-23 | Disposition: A | Discharge status: Home / Self Care | Payer: Medicare HMO | Source: Ambulatory Visit | Attending: Hematology | Admitting: Hematology

## 2023-05-23 ENCOUNTER — Inpatient Hospital Stay: Payer: Medicare HMO | Admitting: Hematology

## 2023-05-23 VITALS — BP 121/88 | HR 92 | Resp 18

## 2023-05-23 VITALS — BP 117/80 | HR 104 | Temp 98.2°F | Resp 18 | Ht 62.0 in | Wt 141.4 lb

## 2023-05-23 DIAGNOSIS — C250 Malignant neoplasm of head of pancreas: Secondary | ICD-10-CM | POA: Insufficient documentation

## 2023-05-23 DIAGNOSIS — R530 Neoplastic (malignant) related fatigue: Secondary | ICD-10-CM

## 2023-05-23 DIAGNOSIS — R630 Anorexia: Secondary | ICD-10-CM | POA: Diagnosis not present

## 2023-05-23 DIAGNOSIS — Z95828 Presence of other vascular implants and grafts: Secondary | ICD-10-CM

## 2023-05-23 DIAGNOSIS — G893 Neoplasm related pain (acute) (chronic): Secondary | ICD-10-CM

## 2023-05-23 DIAGNOSIS — Z515 Encounter for palliative care: Secondary | ICD-10-CM

## 2023-05-23 DIAGNOSIS — R634 Abnormal weight loss: Secondary | ICD-10-CM

## 2023-05-23 DIAGNOSIS — Z79899 Other long term (current) drug therapy: Secondary | ICD-10-CM | POA: Diagnosis not present

## 2023-05-23 DIAGNOSIS — Z7189 Other specified counseling: Secondary | ICD-10-CM

## 2023-05-23 DIAGNOSIS — C787 Secondary malignant neoplasm of liver and intrahepatic bile duct: Secondary | ICD-10-CM | POA: Diagnosis present

## 2023-05-23 DIAGNOSIS — R531 Weakness: Secondary | ICD-10-CM

## 2023-05-23 LAB — CBC WITH DIFFERENTIAL (CANCER CENTER ONLY)
Abs Immature Granulocytes: 0.04 10*3/uL (ref 0.00–0.07)
Basophils Absolute: 0.1 10*3/uL (ref 0.0–0.1)
Basophils Relative: 1 %
Eosinophils Absolute: 0.1 10*3/uL (ref 0.0–0.5)
Eosinophils Relative: 1 %
HCT: 22.9 % — ABNORMAL LOW (ref 36.0–46.0)
Hemoglobin: 8.2 g/dL — ABNORMAL LOW (ref 12.0–15.0)
Immature Granulocytes: 1 %
Lymphocytes Relative: 24 %
Lymphs Abs: 1.8 10*3/uL (ref 0.7–4.0)
MCH: 31.8 pg (ref 26.0–34.0)
MCHC: 35.8 g/dL (ref 30.0–36.0)
MCV: 88.8 fL (ref 80.0–100.0)
Monocytes Absolute: 0.6 10*3/uL (ref 0.1–1.0)
Monocytes Relative: 8 %
Neutro Abs: 5.1 10*3/uL (ref 1.7–7.7)
Neutrophils Relative %: 65 %
Platelet Count: 220 10*3/uL (ref 150–400)
RBC: 2.58 MIL/uL — ABNORMAL LOW (ref 3.87–5.11)
RDW: 24 % — ABNORMAL HIGH (ref 11.5–15.5)
WBC Count: 7.8 10*3/uL (ref 4.0–10.5)
nRBC: 0 % (ref 0.0–0.2)

## 2023-05-23 LAB — CMP (CANCER CENTER ONLY)
ALT: 49 U/L — ABNORMAL HIGH (ref 0–44)
AST: 56 U/L — ABNORMAL HIGH (ref 15–41)
Albumin: 2.9 g/dL — ABNORMAL LOW (ref 3.5–5.0)
Alkaline Phosphatase: 878 U/L — ABNORMAL HIGH (ref 38–126)
Anion gap: 9 (ref 5–15)
BUN: 14 mg/dL (ref 8–23)
CO2: 20 mmol/L — ABNORMAL LOW (ref 22–32)
Calcium: 8.8 mg/dL — ABNORMAL LOW (ref 8.9–10.3)
Chloride: 101 mmol/L (ref 98–111)
Creatinine: 0.59 mg/dL (ref 0.44–1.00)
GFR, Estimated: >60 mL/min (ref >60)
Glucose, Bld: 153 mg/dL — ABNORMAL HIGH (ref 70–99)
Potassium: 4.1 mmol/L (ref 3.5–5.1)
Sodium: 130 mmol/L — ABNORMAL LOW (ref 135–145)
Total Bilirubin: 11.2 mg/dL (ref 0.3–1.2)
Total Protein: 6.3 g/dL — ABNORMAL LOW (ref 6.5–8.1)

## 2023-05-23 MED ORDER — GABAPENTIN 100 MG PO CAPS
100.0000 mg | ORAL_CAPSULE | Freq: Two times a day (BID) | ORAL | 1 refills | Status: DC
  Filled 2023-05-23: qty 90, 23d supply, fill #0

## 2023-05-23 MED ORDER — SODIUM CHLORIDE 0.9% FLUSH
10.0000 mL | Freq: Once | INTRAVENOUS | Status: AC
  Administered 2023-05-23: 10 mL via INTRAVENOUS

## 2023-05-23 MED ORDER — HEPARIN SOD (PORK) LOCK FLUSH 100 UNIT/ML IV SOLN
500.0000 [IU] | Freq: Once | INTRAVENOUS | Status: AC
  Administered 2023-05-23: 500 [IU]

## 2023-05-23 MED ORDER — SUCRALFATE 1 G PO TABS
1.0000 g | ORAL_TABLET | Freq: Three times a day (TID) | ORAL | 2 refills | Status: DC
  Filled 2023-05-23: qty 60, 15d supply, fill #0

## 2023-05-23 MED ORDER — SODIUM CHLORIDE 0.9% FLUSH
10.0000 mL | Freq: Once | INTRAVENOUS | Status: AC
  Administered 2023-05-23: 10 mL

## 2023-05-23 MED ORDER — HEPARIN SOD (PORK) LOCK FLUSH 100 UNIT/ML IV SOLN
500.0000 [IU] | Freq: Once | INTRAVENOUS | Status: AC
  Administered 2023-05-23: 500 [IU] via INTRAVENOUS

## 2023-05-23 NOTE — Assessment & Plan Note (Signed)
stage IV c(T2, N0, M1) with liver metastasis, MMR normal, Tempus (-) -Diagnosed in 01/2022. presented to PCP with jaundice for one month. Also complained of epigastric pain, poor appetite, weight loss, and light-colored stool.  -she began neoadjuvant gemcitabine alone on 02/16/22, abraxane was added with C2 (03/08/22).   -liver biopsy confirmed metastatic cancer  -Due to cancer progression, chemo changed to second line FOLFIRI on November 2023 -She unfortunately did not respond well to chemo, restaging scan from February 2024 showed disease progression in liver again -She was changed to third line chemotherapy FOLFOX on 01/03/23, she is overall tolerating well  -Her tumor marker CA 19.9 has increased significantly after first cycle FOLFOX, but it went down after 3 cycles chemo  -She had significant fatigue and dehydration after cycle 4 chemotherapy.  She recovered slowly.  I reduced her chemo dose from cycle 5. She cancelled her C6 chemo  -Unfortunately her restaging CT scan from Apr 02, 2023 showed cancer progression in pancreas in the liver.  -Due to her poor response to chemotherapy, and worsening performance status, I do not recommend further chemotherapy.  She unfortunately is not a candidate for immunotherapy or targeted therapy. -Continue Perative care, she has declined hospice previously

## 2023-05-23 NOTE — Progress Notes (Signed)
[facility]   Telephone:([phone]) [phone] Fax:(336) [phone]   Clinic Follow up Note   Patient Care Team: Karle Plumber, MD as PCP - General (Internal Medicine) Maisie Fus, MD as PCP - Cardiology (Cardiology) Malachy Mood, MD as Consulting Physician (Oncology) Pickenpack-Cousar, Arty Baumgartner, NP as Nurse Practitioner (Nurse Practitioner)  Date of Service:  05/23/2023  CHIEF COMPLAINT: f/u of pancreatic cancer   CURRENT THERAPY:  Supportive therapy   ASSESSMENT:  Miranda Francis is a 77 y.o. female with   Pancreatic cancer (HCC) stage IV c(T2, N0, M1) with liver metastasis, MMR normal, Tempus (-) -Diagnosed in 01/2022. presented to PCP with jaundice for one month. Also complained of epigastric pain, poor appetite, weight loss, and light-colored stool.  -she began neoadjuvant gemcitabine alone on 02/16/22, abraxane was added with C2 (03/08/22).   -liver biopsy confirmed metastatic cancer  -Due to cancer progression, chemo changed to second line FOLFIRI on November 2023 -She unfortunately did not respond well to chemo, restaging scan from February 2024 showed disease progression in liver again -She was changed to third line chemotherapy FOLFOX on 01/03/23, she is overall tolerating well  -Her tumor marker CA 19.9 has increased significantly after first cycle FOLFOX, but it went down after 3 cycles chemo  -She had significant fatigue and dehydration after cycle 4 chemotherapy.  She recovered slowly.  I reduced her chemo dose from cycle 5. She cancelled her C6 chemo  -Unfortunately her restaging CT scan from Apr 02, 2023 showed cancer progression in pancreas in the liver.  -Due to her poor response to chemotherapy, and worsening performance status, I do not recommend further chemotherapy.  She unfortunately is not a candidate for immunotherapy or targeted therapy. -Continue Perative care, she has declined hospice previously and declined again today   New onset jaundice -Likely  related to cancer progression in her liver, or biliary stent obstruction -I ordered stat abdominal ultrasound, which will be done this afternoon. -I also reach out to her GI Dr. Elnoria Howard in case she needs ERCP    Goal of care discussion  -We again discussed the incurable nature of his/her cancer, and the overall poor prognosis, especially if he/she does not have good response to chemotherapy or progress on chemo -The patient understands the goal of care is palliative. -We have repeatedly recommended hospice, patient still declined.   PLAN: - I order [place] of abdomen for jaundice evaluation, it's scheduled after her OV -she will see palliative care NP Nikki today -I will call her with ultrasound results  Addendum -Her ultrasound of abdomen showed a diffuse liver metastasis, biliary stent in place, no biliary duct dilatation.  I spoke with Dr. Elnoria Howard, no need for ERCP. -I called the patient back after clinic, discussed the ultrasound findings, and again discussed with the poor prognosis given her rapid disease progression in liver, and the severe jaundice.  Her life expectancy is likely a few weeks to months.  After another discussion, patient finally agreed with home hospice care, I will refer her to [facility] [place].  She knows to call me if anything we can help her.   SUMMARY OF ONCOLOGIC HISTORY: Oncology History Overview Note   Cancer Staging  Pancreatic cancer [facility]) Staging form: Exocrine Pancreas, AJCC 8th Edition - Clinical stage from 01/18/2022: Stage IB (cT2, cN0, cM0) - Signed by Malachy Mood, MD on 01/28/2022    Pancreatic cancer (HCC)  01/16/2022 Imaging   CLINICAL DATA:  Jaundice   EXAM: CT ABDOMEN AND PELVIS  WITH CONTRAST  IMPRESSION: 1. Marked intra and extrahepatic biliary dilatation with marked dilatation of pancreatic duct and distended gallbladder. There is significant atrophy of the body and tail of pancreas. Ill-defined soft tissue mass at the pancreatic head neck region  with marked narrowing of the SMV by ill-defined mass, constellation of findings concerning for neoplasm/pancreas carcinoma. 2. Diverticular disease of the colon without acute wall thickening 3. Mild endometrial thickening up to 9 mm, recommend nonemergent pelvic ultrasound for further evaluation   01/17/2022 Imaging   EXAM: MRI ABDOMEN WITHOUT AND WITH CONTRAST (INCLUDING MRCP)  IMPRESSION: 1. Obstructing mass within the pancreatic head with an intraluminal component demonstrating low level enhancement. 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 2. This mass causes significant extrinsic narrowing at the portal/superior mesenteric vein confluence as well as marked intra and extrahepatic biliary dilatation. 3. No distant metastases identified. 4. Endoscopy for endoscopic ultrasound, tissue sampling and biliary decompression recommended.   01/18/2022 Cancer Staging   Staging form: Exocrine Pancreas, AJCC 8th Edition - Clinical stage from 01/18/2022: Stage IB (cT2, cN0, cM0) - Signed by Malachy Mood, MD on 01/28/2022 Stage prefix: Initial diagnosis Total positive nodes: 0   01/18/2022 Initial Biopsy   FINAL MICROSCOPIC DIAGNOSIS:  A. PANCREAS, HEAD, FINE NEEDLE ASPIRATION:  - Malignant cells present consistent with adenocarcinoma   01/24/2022 Imaging   EXAM: CT CHEST WITHOUT CONTRAST  IMPRESSION: 1. No evidence of metastatic disease in the chest. 2. Atelectasis or consolidation in the right lower lung. 3. Healing right rib fractures. 4. Mass in the head of the pancreas. See previous report of CT abdomen and pelvis 01/16/2022. Interval placement of the biliary stent.   01/28/2022 Initial Diagnosis   Pancreatic cancer (HCC)   02/16/2022 - 06/28/2022 Chemotherapy   Patient is on Treatment Plan : PANCREATIC Abraxane / Gemcitabine  D1,8 q21d     02/16/2022 Genetic Testing   Negative genetic testing on the CancerNext-Expanded+RNAinsight panel.  The report date is February 16, 2022.  The CancerNext-Expanded gene panel offered by [facility] and includes sequencing and rearrangement analysis for the following 77 genes: AIP, ALK, APC*, ATM*, AXIN2, BAP1, BARD1, BLM, BMPR1A, BRCA1*, BRCA2*, BRIP1*, CDC73, CDH1*, CDK4, CDKN1B, CDKN2A, CHEK2*, CTNNA1, DICER1, FANCC, FH, FLCN, GALNT12, KIF1B, LZTR1, MAX, MEN1, MET, MLH1*, MSH2*, MSH3, MSH6*, MUTYH*, NBN, NF1*, NF2, NTHL1, PALB2*, PHOX2B, PMS2*, POT1, PRKAR1A, PTCH1, PTEN*, RAD51C*, RAD51D*, RB1, RECQL, RET, SDHA, SDHAF2, SDHB, SDHC, SDHD, SMAD4, SMARCA4, SMARCB1, SMARCE1, STK11, SUFU, TMEM127, TP53*, TSC1, TSC2, VHL and XRCC2 (sequencing and deletion/duplication); EGFR, EGLN1, HOXB13, KIT, MITF, PDGFRA, POLD1, and POLE (sequencing only); EPCAM and GREM1 (deletion/duplication only). DNA and RNA analyses performed for * genes.    06/14/2022 - 09/19/2022 Chemotherapy   Patient is on Treatment Plan : PANCREATIC Abraxane D1,8,15 + Gemcitabine D1,8,15 q28d     10/11/2022 - 12/08/2022 Chemotherapy   Patient is on Treatment Plan : PANCREAS Liposomal Irinotecan + Leucovorin + 5-FU IVCI q14d     12/25/2022 Imaging    IMPRESSION: 1. Left-sided pulmonary emboli. No findings for right heart strain. 2. Stable small left upper lobe pulmonary nodules. No new or progressive findings in the chest. 3. Overall stable appearing infiltrating pancreatic neoplasm. 4. New and interval enlargement of hepatic metastatic disease. 5. Biliary stent in place with associated pneumobilia and gas in the gallbladder. Mild to moderate biliary dilatation. 6. 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 Extensive portal venous collateral vessels.     01/03/2023 - 03/23/2023 Chemotherapy   Patient is on Treatment Plan : PANCREAS FOLFOX q14d      Pathology Results      Pathology Results       Miscellaneous         INTERVAL HISTORY:  Miranda Francis is here for a follow up of  pancreatic cancer .She was last seen by me on 04/18/2023. She presents to the clinic accompanied by husband. Pt state that she hadn't had a bowel movement in 5 days. She stated she finally went today. Pt reports of having abdominal pain. When she takes oxycodone for pain it hurts her stomach.      All other systems were reviewed with the patient and are negative.  MEDICAL HISTORY:  Past Medical History:  Diagnosis Date   Blood transfusion    Diabetes mellitus    Family history of breast cancer    Family history of colon cancer    Family history of prostate cancer    Heart murmur    Hypertension    pancreatic ca 12/2021    SURGICAL HISTORY: Past Surgical History:  Procedure Laterality Date   BILIARY STENT PLACEMENT  01/18/2022   Procedure: BILIARY STENT PLACEMENT;  Surgeon: Jeani Hawking, MD;  Location: [facility] ENDOSCOPY;  Service: Gastroenterology;;   DILATION AND CURETTAGE OF UTERUS     ERCP N/A 01/18/2022   Procedure: ENDOSCOPIC RETROGRADE CHOLANGIOPANCREATOGRAPHY (ERCP);  Surgeon: Jeani Hawking, MD;  Location: [facility] ENDOSCOPY;  Service: Gastroenterology;  Laterality: N/A;   ESOPHAGOGASTRODUODENOSCOPY N/A 01/18/2022   Procedure: ESOPHAGOGASTRODUODENOSCOPY (EGD);  Surgeon: Jeani Hawking, MD;  Location: [facility] ENDOSCOPY;  Service: Gastroenterology;  Laterality: N/A;   EUS  01/18/2022   Procedure: UPPER ENDOSCOPIC ULTRASOUND (EUS) LINEAR;  Surgeon: Jeani Hawking, MD;  Location: [facility] ENDOSCOPY;  Service: Gastroenterology;;   FINE NEEDLE ASPIRATION  01/18/2022   Procedure: FINE NEEDLE ASPIRATION (FNA) LINEAR;  Surgeon: Jeani Hawking, MD;  Location: [facility] ENDOSCOPY;  Service: Gastroenterology;;   IR IMAGING GUIDED PORT INSERTION  02/15/2022   NO PAST SURGERIES     SPHINCTEROTOMY  01/18/2022   Procedure: Dennison Mascot;  Surgeon: Jeani Hawking, MD;  Location: [facility] ENDOSCOPY;  Service: Gastroenterology;;    I have reviewed the  social history and family history with the patient and they are unchanged from previous note.  ALLERGIES:  is allergic to irinotecan liposome.  MEDICATIONS:  Current Outpatient Medications  Medication Sig Dispense Refill   acetaminophen (TYLENOL) 500 MG tablet Take 1,000 mg by mouth every 6 (six) hours as needed for moderate pain or headache.     amLODipine (NORVASC) 5 MG tablet TAKE 1 TABLET (5 MG TOTAL) BY MOUTH DAILY. 90 tablet 1   Cholecalciferol (QC VITAMIN D3) 50 MCG (2000 UT) TABS Take 2,000 Units by mouth daily.     Continuous Blood Gluc Sensor (FREESTYLE LIBRE 2 SENSOR) MISC USE 1 SENSOR AND CHANGE EVERY 14 DAYS AS DIRECTED.     dicyclomine (BENTYL) 10 MG capsule Take 1 capsule (10 mg total) by mouth 3 (three) times daily before meals. 90 capsule 0   diphenoxylate-atropine (LOMOTIL) 2.5-0.025 MG tablet Take 1 tablet by mouth 4 (four) times daily as needed for diarrhea or loose stools. 30 tablet 0   ELIQUIS 5 MG TABS tablet TAKE 1 TABLET BY MOUTH TWICE A DAY 60 tablet 2   fentaNYL (DURAGESIC) 12 MCG/HR Place 1 patch onto the skin every 3 (three) days. 5 patch 0  gabapentin (NEURONTIN) 100 MG capsule Take 1-2 capsules (100-200 mg total) by mouth 2 (two) times daily. 90 capsule 1   KLOR-CON M20 20 MEQ tablet Take 20 mEq by mouth 2 (two) times daily.     lidocaine-prilocaine (EMLA) cream Apply 1 application topically as needed. 30 g 1   losartan (COZAAR) 100 MG tablet Take 100 mg by mouth daily.     NOVOLOG FLEXPEN 100 UNIT/ML FlexPen Inject 15 Units into the skin 2 (two) times daily.     Oxycodone HCl 10 MG TABS Take 1 tablet (10 mg total) by mouth every 4 (four) hours as needed. 60 tablet 0   polyethylene glycol (MIRALAX / GLYCOLAX) 17 g packet Take 17 g by mouth daily. 14 each 0   sucralfate (CARAFATE) 1 g tablet Take 1 tablet (1 g total) by mouth 4 (four) times daily -  with meals and at bedtime. 60 tablet 2   zolpidem (AMBIEN) 10 MG tablet Take 10 mg by mouth at bedtime as needed.      No current facility-administered medications for this visit.    PHYSICAL EXAMINATION: ECOG PERFORMANCE STATUS: 3 - Symptomatic, >50% confined to bed  Vitals:   05/23/23 1200  BP: 117/80  Pulse: (!) 104  Resp: 18  Temp: 98.2 F (36.8 C)  SpO2: 100%   Wt Readings from Last 3 Encounters:  05/23/23 141 lb 6.4 oz (64.1 kg)  05/07/23 141 lb 11.2 oz (64.3 kg)  04/18/23 142 lb (64.4 kg)     GENERAL:alert, no distress and comfortable SKIN: skin color normal, no rashes or significant lesions EYES: normal, Conjunctiva are pink and non-injected, sclera clear  NEURO: alert & oriented x 3 with fluent speech  LABORATORY DATA:  I have reviewed the data as listed    Latest Ref Rng & Units 05/23/2023   11:33 AM 04/18/2023    8:32 AM 03/21/2023    8:12 AM  CBC  WBC 4.0 - 10.5 K/uL 7.8  5.5  4.4   Hemoglobin 12.0 - 15.0 g/dL 8.2  9.9  9.8   Hematocrit 36.0 - 46.0 % 22.9  29.9  29.5   Platelets 150 - 400 K/uL 220  143  105         Latest Ref Rng & Units 05/23/2023   11:33 AM 04/18/2023    8:32 AM 03/21/2023    8:12 AM  CMP  Glucose 70 - 99 mg/dL 409  811  914   BUN 8 - 23 mg/dL 14  11  12    Creatinine 0.44 - 1.00 mg/dL 7.82  9.56  2.13   Sodium 135 - 145 mmol/L 130  139  139   Potassium 3.5 - 5.1 mmol/L 4.1  3.8  4.1   Chloride 98 - 111 mmol/L 101  109  109   CO2 22 - 32 mmol/L 20  23  24    Calcium 8.9 - 10.3 mg/dL 8.8  9.1  8.9   Total Protein 6.5 - 8.1 g/dL 6.3  6.6  6.4   Total Bilirubin 0.3 - 1.2 mg/dL 08.6  0.6  0.4   Alkaline Phos 38 - 126 U/L 878  232  233   AST 15 - 41 U/L 56  22  23   ALT 0 - 44 U/L 49  11  16       RADIOGRAPHIC STUDIES: I have personally reviewed the radiological images as listed and agreed with the findings in the report. US Abdomen Complete  Result Date: 05/23/2023  CLINICAL DATA:  Pancreatic malignancy Jaundice EXAM: ABDOMEN ULTRASOUND COMPLETE COMPARISON:  CT chest abdomen pelvis 04/02/2023 FINDINGS: Gallbladder: Sludge seen within gallbladder  lumen. No gallbladder wall thickening or pericholecystic fluid. Common bile duct: Diameter: 13 mm.  Stent in place. Liver: Mildly nodular hepatic contours suspicious for cirrhosis. 3.2 x 1.0 x 2.3 cm left hepatic lobe mass again seen. 4.2 x 4.0 x 3.5 cm mass again seen in the right hepatic lobe. Portal vein is patent. Bidirectional flow seen within the portal vein. IVC: No abnormality visualized. Pancreas: Visualized portion unremarkable. Spleen: Size and appearance within normal limits. Right Kidney: Length: 10.0 cm. Echogenicity within normal limits. No mass or hydronephrosis visualized. Left Kidney: Length: 10.8 cm. Echogenicity within normal limits. No mass or hydronephrosis visualized. Abdominal aorta: No aneurysm visualized. Other findings: Mild perihepatic ascites which has increased since prior CT. IMPRESSION: 1. Multiple hepatic lesions again seen consistent with metastatic disease. 2. Nodular hepatic contours indicative of cirrhosis. Mild perihepatic ascites which is increased since prior examination. 3. Common bile duct stent in place. No significant intrahepatic biliary dilatation is identified. 4. Bidirectional flow within the portal vein suspicious for portal hypertension. Electronically Signed   By: Acquanetta Belling M.D.   On: 05/23/2023 14:33      Orders Placed This Encounter  Procedures   US Abdomen Complete    Standing Status:   Future    Number of Occurrences:   1    Standing Expiration Date:   05/22/2024    Order Specific Question:   Reason for Exam (SYMPTOM  OR DIAGNOSIS REQUIRED)    Answer:   jaundice evaluation    Order Specific Question:   Preferred imaging location?    Answer:   [facility]    Order Specific Question:   Release to patient    Answer:   Immediate   Ambulatory referral to Hospice    Referral Priority:   Urgent    Referral Type:   Consultation    Referral Reason:   Symptom Managment    Requested Specialty:   Hospice Services    Number of Visits Requested:    1   All questions were answered. The patient knows to call the clinic with any problems, questions or concerns. No barriers to learning was detected. The total time spent in the appointment was 45 minutes.     Malachy Mood, MD 05/23/2023   Carolin Coy, CMA, am acting as scribe for Malachy Mood, MD.   I have reviewed the above documentation for accuracy and completeness, and I agree with the above.

## 2023-05-23 NOTE — Telephone Encounter (Signed)
CRITICAL VALUE STICKER  CRITICAL VALUE: Bili  11.2  RECEIVER (on-site recipient of call): Sharlette Dense CMA  DATE & TIME NOTIFIED: 05/23/2023 1250  MESSENGER (representative from lab): Daishia in Lab  MD NOTIFIED: Dr. Malachy Mood   TIME OF NOTIFICATION: 1250  RESPONSE: Made nurse and physician aware

## 2023-05-24 ENCOUNTER — Other Ambulatory Visit: Payer: Self-pay

## 2023-05-24 NOTE — Progress Notes (Signed)
Faxed hospice referral packet to [facility] [place] per Dr. Latanya Maudlin request.  Fax confirmation received.

## 2023-05-29 LAB — CANCER ANTIGEN 19-9: CA 19-9: 302893 U/mL — ABNORMAL HIGH (ref 0–35)

## 2023-06-12 NOTE — Progress Notes (Signed)
This encounter was created in error - please disregard.

## 2023-07-14 DEATH — deceased

## 2023-07-17 ENCOUNTER — Other Ambulatory Visit (HOSPITAL_BASED_OUTPATIENT_CLINIC_OR_DEPARTMENT_OTHER): Payer: Self-pay
